# Patient Record
Sex: Female | Born: 1940 | Race: Black or African American | Hispanic: No | Marital: Single | Smoking: Current some day smoker
Health system: Southern US, Community
[De-identification: ages and names within clinical notes are randomized; demographics above are authoritative.]

## PROBLEM LIST (undated history)

## (undated) DIAGNOSIS — Z720 Tobacco use: Secondary | ICD-10-CM

## (undated) DIAGNOSIS — I2510 Atherosclerotic heart disease of native coronary artery without angina pectoris: Secondary | ICD-10-CM

## (undated) DIAGNOSIS — N184 Chronic kidney disease, stage 4 (severe): Secondary | ICD-10-CM

## (undated) DIAGNOSIS — M109 Gout, unspecified: Secondary | ICD-10-CM

## (undated) DIAGNOSIS — I700 Atherosclerosis of aorta: Secondary | ICD-10-CM

## (undated) DIAGNOSIS — I119 Hypertensive heart disease without heart failure: Secondary | ICD-10-CM

## (undated) DIAGNOSIS — I10 Essential (primary) hypertension: Secondary | ICD-10-CM

## (undated) DIAGNOSIS — I739 Peripheral vascular disease, unspecified: Secondary | ICD-10-CM

## (undated) DIAGNOSIS — M1990 Unspecified osteoarthritis, unspecified site: Secondary | ICD-10-CM

## (undated) DIAGNOSIS — E785 Hyperlipidemia, unspecified: Secondary | ICD-10-CM

## (undated) DIAGNOSIS — I639 Cerebral infarction, unspecified: Secondary | ICD-10-CM

## (undated) DIAGNOSIS — J449 Chronic obstructive pulmonary disease, unspecified: Secondary | ICD-10-CM

## (undated) DIAGNOSIS — E039 Hypothyroidism, unspecified: Secondary | ICD-10-CM

## (undated) DIAGNOSIS — K219 Gastro-esophageal reflux disease without esophagitis: Secondary | ICD-10-CM

## (undated) DIAGNOSIS — R109 Unspecified abdominal pain: Secondary | ICD-10-CM

## (undated) DIAGNOSIS — R1031 Right lower quadrant pain: Secondary | ICD-10-CM

## (undated) DIAGNOSIS — M5126 Other intervertebral disc displacement, lumbar region: Secondary | ICD-10-CM

## (undated) DIAGNOSIS — M48062 Spinal stenosis, lumbar region with neurogenic claudication: Secondary | ICD-10-CM

## (undated) DIAGNOSIS — I5030 Unspecified diastolic (congestive) heart failure: Secondary | ICD-10-CM

## (undated) DIAGNOSIS — M79641 Pain in right hand: Principal | ICD-10-CM

## (undated) DIAGNOSIS — N1832 Chronic kidney disease, stage 3b (HCC): Principal | ICD-10-CM

## (undated) HISTORY — PX: REPLACEMENT TOTAL KNEE: SUR1224

## (undated) HISTORY — PX: ABDOMINAL HYSTERECTOMY: SHX81

## (undated) HISTORY — DX: Cerebral infarction, unspecified: I63.9

## (undated) HISTORY — PX: THYROID SURGERY: SHX805

## (undated) HISTORY — DX: Chronic obstructive pulmonary disease, unspecified: J44.9

---

## 2012-11-26 LAB — D-DIMER, QUANTITATIVE: D-Dimer, Quant: 0.98 mg/L FEU — ABNORMAL HIGH (ref 0.00–0.65)

## 2012-11-26 LAB — CBC WITH AUTOMATED DIFF
ABS. BASOPHILS: 0 10*3/uL (ref 0.0–0.1)
ABS. EOSINOPHILS: 0.1 10*3/uL (ref 0.0–0.4)
ABS. LYMPHOCYTES: 1.7 10*3/uL (ref 0.8–3.5)
ABS. MONOCYTES: 0.3 10*3/uL (ref 0.0–1.0)
ABS. NEUTROPHILS: 1.5 10*3/uL — ABNORMAL LOW (ref 1.8–8.0)
BASOPHILS: 1 % (ref 0–1)
EOSINOPHILS: 4 % (ref 0–7)
HCT: 38.5 % (ref 35.0–47.0)
HGB: 13.4 g/dL (ref 11.5–16.0)
LYMPHOCYTES: 46 % (ref 12–49)
MCH: 29.7 PG (ref 26.0–34.0)
MCHC: 34.8 g/dL (ref 30.0–36.5)
MCV: 85.4 FL (ref 80.0–99.0)
MONOCYTES: 7 % (ref 5–13)
NEUTROPHILS: 42 % (ref 32–75)
PLATELET: 248 10*3/uL (ref 150–400)
RBC: 4.51 M/uL (ref 3.80–5.20)
RDW: 14.1 % (ref 11.5–14.5)
WBC: 3.7 10*3/uL (ref 3.6–11.0)

## 2012-11-26 LAB — METABOLIC PANEL, COMPREHENSIVE
A-G Ratio: 1 — ABNORMAL LOW (ref 1.1–2.2)
ALT (SGPT): 25 U/L (ref 12–78)
AST (SGOT): 26 U/L (ref 15–37)
Albumin: 3.9 g/dL (ref 3.5–5.0)
Alk. phosphatase: 95 U/L (ref 45–117)
Anion gap: 7 mmol/L (ref 5–15)
BUN/Creatinine ratio: 16 (ref 12–20)
BUN: 14 MG/DL (ref 6–20)
Bilirubin, total: 0.4 MG/DL (ref 0.2–1.0)
CO2: 23 mmol/L (ref 21–32)
Calcium: 9.4 MG/DL (ref 8.5–10.1)
Chloride: 110 mmol/L — ABNORMAL HIGH (ref 97–108)
Creatinine: 0.89 MG/DL (ref 0.45–1.15)
GFR est AA: >60 mL/min/{1.73_m2} (ref >60)
GFR est non-AA: >60 mL/min/{1.73_m2} (ref >60)
Globulin: 3.9 g/dL (ref 2.0–4.0)
Glucose: 92 mg/dL (ref 65–100)
Potassium: 3.9 mmol/L (ref 3.5–5.1)
Protein, total: 7.8 g/dL (ref 6.4–8.2)
Sodium: 140 mmol/L (ref 136–145)

## 2012-11-26 LAB — CK W/ REFLX CKMB: CK: 382 U/L — ABNORMAL HIGH (ref 26–192)

## 2012-11-26 LAB — D DIMER: D-dimer: 0.98 mg/L FEU — ABNORMAL HIGH (ref 0.00–0.65)

## 2012-11-26 LAB — CK-MB,QUANT.
CK - MB: 2.4 NG/ML (ref 0.5–3.6)
CK-MB Index: 0.6 (ref 0–2.5)

## 2012-11-26 LAB — TROPONIN I: Troponin-I, Qt.: <0.04 ng/mL (ref <0.05)

## 2012-11-26 MED ORDER — ASPIRIN 81 MG CHEWABLE TAB
81 mg | ORAL | Status: AC
Start: 2012-11-26 — End: 2012-11-26
  Administered 2012-11-26: via ORAL

## 2012-11-26 MED ORDER — ALUM-MAG HYDROXIDE-SIMETH 200 MG-200 MG-20 MG/5 ML ORAL SUSP
200-200-205 mg/5 mL | ORAL | Status: DC
Start: 2012-11-26 — End: 2012-11-26
  Administered 2012-11-26: 22:00:00 via ORAL

## 2012-11-26 MED FILL — BAYER CHILDRENS ASPIRIN 81 MG CHEWABLE TABLET: 81 mg | ORAL | Qty: 2

## 2012-11-26 NOTE — Other (Signed)
TRANSFER - OUT REPORT:    Verbal report given to Marisue Ivan RN(name) on Tanya [name]  being transferred to 458(unit) for routine progression of care       Report consisted of patient???s Situation, Background, Assessment and   Recommendations(SBAR).     Information from the following report(s) SBAR was reviewed with the receiving nurse.    Opportunity for questions and clarification was provided.

## 2012-11-26 NOTE — H&P (Signed)
Formatting of this note might be different from the original.  H&P dictated [id number]  Electronically signed by Noralyn PickEniola, Razaak A, MD at 11/26/2012  7:32 PM EDT

## 2012-11-26 NOTE — Unmapped (Signed)
Formatting of this note is different from the original.  TRANSFER - OUT REPORT:    Verbal report given to Marisue IvanLiz RN(name) on Tanya [name]  being transferred to 458(unit) for routine progression of care       Report consisted of patient?s Situation, Background, Assessment and   Recommendations(SBAR).     Information from the following report(s) SBAR was reviewed with the receiving nurse.    Opportunity for questions and clarification was provided.        Electronically signed by Carren RangMines, Markus F, RN at 11/26/2012  8:57 PM EDT

## 2012-11-26 NOTE — ED Notes (Signed)
Formatting of this note might be different from the original.  Pt in room with no changes in status or assessment, periods of mild chest pain, no noted SOB. Will cont to monitor.   Electronically signed by Carren RangMines, Markus F, RN at 11/26/2012  8:19 PM EDT

## 2012-11-26 NOTE — ED Notes (Signed)
Formatting of this note might be different from the original.  Pt in room with no distress or changes in status or assessment, will cont to monitor.   Electronically signed by Carren RangMines, Markus F, RN at 11/26/2012  8:19 PM EDT

## 2012-11-26 NOTE — Progress Notes (Signed)
Formatting of this note is different from the original.  TRANSFER - IN REPORT:    Verbal report received from WarrenMark, RN on Tanya [name]  being received from ED for routine progression of care      Report consisted of patient?s Situation, Background, Assessment and   Recommendations(SBAR).     Information from the following report(s) SBAR, ED Summary, [facility] and Recent Results was reviewed with the receiving nurse.    Opportunity for questions and clarification was provided.      Assessment completed upon patient?s arrival to unit and care assumed.       Electronically signed by Rhea BleacherMarshall, Elizabeth A, RN at 11/26/2012 10:47 PM EDT

## 2012-11-26 NOTE — ED Provider Notes (Signed)
Formatting of this note is different from the original.  HPI Comments: This is a 72 y.o. female who presents to the ED with chest pain.  Pt reported constant "burning" chest discomfort for the past week.  She initially suspected acid reflux but her "symptoms lasted longer than usual."  She denied any change with eating or walking although she noticed some new edema in her feet and ankles this week.  + nebulizer and cpap at night.  No increased SOB.  Hx of RCA stent in 2003 at [facility] in [place], [place].  Pt denied difficulty swallowing, fever, sore throat, cough, nausea, vomiting, diarrhea, urinary symptoms, abdominal pain, headache, numbness, tingling, weakness, or other medically acute issues.    Social HX: No tobacco, alcohol, or drugs    RUE:AVWUJ Leo Grosser, NP    Note written by Novella Olive, Scribe, as dictated by Shirl Harris, MD 5:51 PM    The history is provided by the patient.       Past Medical History   Diagnosis Date   ? CAD (coronary artery disease)    ? Asthma    ? Hypertension    ? Acid reflux        Past Surgical History   Procedure Laterality Date   ? Pr cardiac surg procedure unlist       Stents   ? Hx orthopaedic       right knee replacement       History reviewed. No pertinent family history.     History     Social History   ? Marital Status: N/A     Spouse Name: N/A     Number of Children: N/A   ? Years of Education: N/A     Occupational History   ? Not on file.     Social History Main Topics   ? Smoking status: Not on file   ? Smokeless tobacco: Not on file   ? Alcohol Use: Not on file   ? Drug Use: Not on file   ? Sexually Active: Not on file     Other Topics Concern   ? Not on file     Social History Narrative   ? No narrative on file         ALLERGIES: Iodinated contrast media - iv dye and Pcn    Review of Systems   Constitutional: Negative.  Negative for fever, chills, diaphoresis, appetite change and fatigue.   HENT: Negative.  Negative for congestion, sore  throat, rhinorrhea, trouble swallowing, neck pain, neck stiffness and postnasal drip.    Eyes: Negative.  Negative for photophobia, pain, discharge, redness and visual disturbance.   Respiratory: Negative.  Negative for cough, chest tightness, shortness of breath and wheezing.    Cardiovascular: Positive for chest pain. Negative for palpitations and leg swelling.   Gastrointestinal: Negative.  Negative for nausea, vomiting, abdominal pain, diarrhea, constipation, blood in stool and abdominal distention.   Genitourinary: Negative for dysuria, urgency, frequency, hematuria, decreased urine volume, vaginal bleeding and difficulty urinating.   Musculoskeletal: Negative.  Negative for myalgias, back pain, joint swelling and arthralgias.   Skin: Negative.  Negative for color change, rash and wound.   Neurological: Negative.  Negative for dizziness, tremors, seizures, syncope, speech difficulty, weakness, light-headedness, numbness and headaches.   Psychiatric/Behavioral: Negative.  Negative for behavioral problems, confusion and agitation. The patient is not nervous/anxious.    All other systems reviewed and are negative.    Filed Vitals:  11/26/12 1615   BP: 179/99   Pulse: 84   Temp: 98.3 F (36.8 C)   Resp: 20   Height: 5' (1.524 m)   Weight: 92.987 kg (205 lb)   SpO2: 98%       Physical Exam   Nursing note and vitals reviewed.  Constitutional: She is oriented to person, place, and time. She appears well-developed and well-nourished. No distress.   HENT:   Head: Normocephalic and atraumatic.   Right Ear: External ear normal.   Left Ear: External ear normal.   Nose: Nose normal.   Mouth/Throat: Oropharynx is clear and moist.   Eyes: Conjunctivae and EOM are normal. Pupils are equal, round, and reactive to light. No scleral icterus.   Neck: Normal range of motion. Neck supple. No JVD present. No tracheal deviation present. No thyromegaly present.   No JVD   Cardiovascular: Normal rate, regular rhythm, normal heart  sounds and intact distal pulses.  Exam reveals no gallop and no friction rub.    No murmur heard.  Pulmonary/Chest: Effort normal and breath sounds normal. No respiratory distress. She has no wheezes. She has no rales. She exhibits no tenderness.   No tenderness over chest wall   Abdominal: Soft. Bowel sounds are normal. She exhibits no distension and no mass. There is no tenderness. There is no rebound and no guarding.   Musculoskeletal: Normal range of motion. She exhibits edema (trace, feet and ankles). She exhibits no tenderness.   Lymphadenopathy:     She has no cervical adenopathy.   Neurological: She is alert and oriented to person, place, and time. She has normal reflexes. No cranial nerve deficit. Coordination normal.   Skin: Skin is warm and dry. No rash noted. She is not diaphoretic. No erythema. No pallor.   Psychiatric: She has a normal mood and affect. Her behavior is normal. Judgment and thought content normal.       MDM    Differential Diagnosis; Clinical Impression; Plan:     72 year old African American female visiting from out of town presents to the emergency department with chest pain. Patient reports the chest pain is left-sided and central. The pain has been intermittent over the last week. Difficult to determine if anything makes it better or worse. EKG shows inverted T waves I and aVL with left axis deviation. Patient had a stent placed in 2003 at [facility]. We'll check laboratory studies and chest x-ray and reassess. Patient agrees with this plan.  Amount and/or Complexity of Data Reviewed:   Clinical lab tests:  Ordered and reviewed  Tests in the radiology section of CPT:  Ordered and reviewed  Tests in the medicine section of the CPT:  Ordered and reviewed   Decide to obtain previous medical records or to obtain history from someone other than the patient:  Yes   Obtain history from someone other than the patient:  Yes   Review and summarize past medical records:  Yes   Independant  visualization of image, tracing, or specimen:  Yes  Risk of Significant Complications, Morbidity, and/or Mortality:   Presenting problems:  High  Diagnostic procedures:  High  Management options:  High  Progress:   Patient progress:  Improved    Procedures    6:56 PM  Pt has had chest discomfort for several days. She's not from here. Pt has T wave inversions laterally. History of cardiac stent. Will admit for cardiac evalaution.    CXR- No acute process    Labs  including Troponin negative    CONSULT  7:15 PM  Spoke to Dr Lum BabeEniola hospitalist. He wanted d-dimer added. He will follow up on result. He will admit    Admission for cardiology evaluation and chest pain rule out  Electronically signed by Shirl Harrisowning, Brian E, MD at 11/26/2012  9:29 PM EDT

## 2012-11-26 NOTE — ED Notes (Signed)
Formatting of this note might be different from the original.  Triage Note:  Pt arrives for sternal area CP that started about a week ago.  Pt states she is feeling a little short of breath and has nausea without vomiting.  Pt states she has hx acid reflux but that this feels different.  Electronically signed by Delton SeeHowdyshell, Ashley M, RN at 11/26/2012  4:20 PM EDT

## 2012-11-26 NOTE — ED Notes (Signed)
Pt in room with no changes in status or assessment, periods of mild chest pain, no noted SOB. Will cont to monitor.

## 2012-11-26 NOTE — ED Notes (Signed)
Triage Note:  Pt arrives for sternal area CP that started about a week ago.  Pt states she is feeling a little short of breath and has nausea without vomiting.  Pt states she has hx acid reflux but that this feels different.

## 2012-11-26 NOTE — Progress Notes (Signed)
TRANSFER - IN REPORT:    Verbal report received from [place], RN on Ginger Leeth  being received from ED for routine progression of care      Report consisted of patient???s Situation, Background, Assessment and   Recommendations(SBAR).     Information from the following report(s) SBAR, ED Summary, [facility] and Recent Results was reviewed with the receiving nurse.    Opportunity for questions and clarification was provided.      Assessment completed upon patient???s arrival to unit and care assumed.

## 2012-11-26 NOTE — H&P (Signed)
Name:       Tanya [name], Tanya [name]                    Admitted:    11/26/2012    Account #:  [id number]                     DOB:         1941/05/21  Physician:  Corene Cornea, MD          Age:         72                               HISTORY AND PHYSICAL      PRIMARY CARE PHYSICIAN: Chauncey Fischer, MD    SOURCE OF INFORMATION: The patient.    CHIEF COMPLAINT: Chest pain.    HISTORY OF PRESENT ILLNESS: This is a 72 year old woman with a past medical  history significant for coronary artery disease, status post stent  placement, obstructive sleep apnea, hypertension, COPD, who was in her  usual state of health until about a week ago when the patient started  experiencing chest pain. Initially, the patient thought that the chest pain  is due to acid reflux, but this episode of chest pain lasted longer than  usual and the chest pain that she is experiencing is similar to the chest  pain she had when she was diagnosed with coronary artery disease and stent  procedure was performed. The patient is visiting the [place] area from  another part of [place]. She came to the emergency room today because of  worsening chest pain. When the patient arrived at the emergency room, she  was evaluated by the emergency room physician. EKG was obtained. EKG showed  some changes according to the emergency room physician, but there was no  old EKG to compare the changes with. The patient was subsequently referred  to the hospitalist service for evaluation for admission. No history of  fever, rigors or chills.    PAST MEDICAL HISTORY: Coronary artery disease status post stent placement,  obstructive sleep apnea, hypertension, COPD.    ALLERGIES  1. PENICILLIN.  2. IV DYE.    MEDICATIONS: The patient's list of home medications has not been updated.    FAMILY HISTORY: This was reviewed. Mother had hypertension.    PAST SURGICAL HISTORY: This is significant for cardiac stent placement,  right knee replacement.    SOCIAL HISTORY: The patient  smokes about a half a pack of cigarettes daily.  Denies alcohol abuse.    REVIEW OF SYSTEMS  HEAD, EYES, EARS, NOSE AND THROAT: No headache, no dizziness, no blurring  of vision, no photophobia.  RESPIRATORY SYSTEM: This is positive for cough and shortness of breath, no  hemoptysis.  CARDIOVASCULAR SYSTEM: This is positive for chest pain, no orthopnea, no  palpitation.  GASTROINTESTINAL SYSTEM: No nausea and vomiting, no diarrhea, no  constipation.  GENITOURINARY: No dysuria, no urgency and no frequency.  All other systems are reviewed and they are negative.    PHYSICAL EXAMINATION  GENERAL APPEARANCE: The patient appeared ill, in moderate distress.  VITAL SIGNS: On arrival at the emergency room, temperature 98.3, pulse 84,  respiratory rate , blood pressure 179/99, oxygen saturation 98% on room  air.  HEAD, EYES, EARS, NOSE AND THROAT: Normocephalic, atraumatic. Neck is  supple. No JVD, no carotid bruit. Pupils equal and reactive to light.  No  neck masses.  CHEST: Clear breath sounds. No wheezing, no crackles.  HEART: Normal S1 and S2, regular. No clinically appreciable murmur.  ABDOMEN: Soft, obese, nontender, normal bowel sounds.  CENTRAL NERVOUS SYSTEM: Alert, oriented x3. No gross focal neurological  deficits.  EXTREMITIES: Edema 2+, pulses 2+ bilaterally.    DIAGNOSTIC DATA: Chest x-ray: No acute process. EKG shows a normal sinus  rhythm, left ventricular hypertrophy. No ST and T-wave abnormalities.    LABORATORY DATA: Cardiac profile: CK-MB 2.4, troponin less than 0.04.  Chemistry: Sodium 140, potassium 3.9, chloride 110, CO2 23, glucose 92, BUN  14, creatinine 0.89, calcium 9.4, bilirubin total 0.4, ALT 35, AST 26,  alkaline phosphatase 95, total protein 7.8, albumin level 3.9, globulin at  3.4. Hematology: WBC 3.7, hemoglobin 13.4, hematocrit 38.5, platelets 248.    ASSESSMENT  1. Chest pain.  2. Obstructive sleep apnea.  3. Hypertension.  4. Tobacco abuse.  5. Chronic obstructive pulmonary disease.    PLAN   1. Chest pain. Will place the patient on observation. Will obtain serial  cardiac markers to rule out acute myocardial infarction. Cardiology consult  will be requested to assist in evaluation and treatment. Will start the  patient on aspirin and a beta blocker. Will also evaluate the patient for  other atypical causes of chest pain. Will obtain a D-dimer. If this is  significantly elevated, will evaluate the patient for thromboembolism as a  possible cause of her chest pain, especially given the history of recent  travel to [place] area. Will check amylase and lipase levels.  2. Obstructive sleep apnea. Will continue with CPAP with the home setting.  3. Hypertension. Will resume preadmission medication once the list of her  home medication has been updated.  4. Tobacco abuse. Patient advised to quit smoking. Will place the patient  on Nicoderm patch.  5. Chronic obstructive pulmonary disease. Will resume her home medications  if this is known. While awaiting for home medications, the patient will be  placed on DuoNebs. Since the patient also complained of shortness of  breath, will obtain a BNP level as well.    OTHER ISSUES: Code status: The patient is a FULL CODE. Will place the  patient on Lovenox for DVT prophylaxis.                Corene Cornea, MD    cc:                       Corene Cornea, MD  Chauncey Fischer, MD, PRIMARY CARE PHYSICIAN      RAE/wmx; D: 11/26/2012 07:30 P; T: 11/26/2012 08:07 P; DOC# [id number]; Job#  [id number]

## 2012-11-26 NOTE — ED Provider Notes (Signed)
HPI Comments: This is a 72 y.o. female who presents to the ED with chest pain.  Pt reported constant "burning" chest discomfort for the past week.  She initially suspected acid reflux but her "symptoms lasted longer than usual."  She denied any change with eating or walking although she noticed some new edema in her feet and ankles this week.  + nebulizer and cpap at night.  No increased SOB.  Hx of RCA stent in 2003 at [facility] in [place], [place].  Pt denied difficulty swallowing, fever, sore throat, cough, nausea, vomiting, diarrhea, urinary symptoms, abdominal pain, headache, numbness, tingling, weakness, or other medically acute issues.    Social HX: No tobacco, alcohol, or drugs      ZOX:WRUEA Leo Grosser, NP    Note written by Novella Olive, Scribe, as dictated by Shirl Harris, MD 5:51 PM        The history is provided by the patient.        Past Medical History   Diagnosis Date   ??? CAD (coronary artery disease)    ??? Asthma    ??? Hypertension    ??? Acid reflux         Past Surgical History   Procedure Laterality Date   ??? Pr cardiac surg procedure unlist       Stents   ??? Hx orthopaedic       right knee replacement         History reviewed. No pertinent family history.     History     Social History   ??? Marital Status: N/A     Spouse Name: N/A     Number of Children: N/A   ??? Years of Education: N/A     Occupational History   ??? Not on file.     Social History Main Topics   ??? Smoking status: Not on file   ??? Smokeless tobacco: Not on file   ??? Alcohol Use: Not on file   ??? Drug Use: Not on file   ??? Sexually Active: Not on file     Other Topics Concern   ??? Not on file     Social History Narrative   ??? No narrative on file                  ALLERGIES: Iodinated contrast media - iv dye and Pcn      Review of Systems   Constitutional: Negative.  Negative for fever, chills, diaphoresis, appetite change and fatigue.   HENT: Negative.  Negative for congestion, sore throat, rhinorrhea, trouble swallowing,  neck pain, neck stiffness and postnasal drip.    Eyes: Negative.  Negative for photophobia, pain, discharge, redness and visual disturbance.   Respiratory: Negative.  Negative for cough, chest tightness, shortness of breath and wheezing.    Cardiovascular: Positive for chest pain. Negative for palpitations and leg swelling.   Gastrointestinal: Negative.  Negative for nausea, vomiting, abdominal pain, diarrhea, constipation, blood in stool and abdominal distention.   Genitourinary: Negative for dysuria, urgency, frequency, hematuria, decreased urine volume, vaginal bleeding and difficulty urinating.   Musculoskeletal: Negative.  Negative for myalgias, back pain, joint swelling and arthralgias.   Skin: Negative.  Negative for color change, rash and wound.   Neurological: Negative.  Negative for dizziness, tremors, seizures, syncope, speech difficulty, weakness, light-headedness, numbness and headaches.   Psychiatric/Behavioral: Negative.  Negative for behavioral problems, confusion and agitation. The patient is not nervous/anxious.    All  other systems reviewed and are negative.        Filed Vitals:    11/26/12 1615   BP: 179/99   Pulse: 84   Temp: 98.3 ??F (36.8 ??C)   Resp: 20   Height: 5' (1.524 m)   Weight: 92.987 kg (205 lb)   SpO2: 98%            Physical Exam   Nursing note and vitals reviewed.  Constitutional: She is oriented to person, place, and time. She appears well-developed and well-nourished. No distress.   HENT:   Head: Normocephalic and atraumatic.   Right Ear: External ear normal.   Left Ear: External ear normal.   Nose: Nose normal.   Mouth/Throat: Oropharynx is clear and moist.   Eyes: Conjunctivae and EOM are normal. Pupils are equal, round, and reactive to light. No scleral icterus.   Neck: Normal range of motion. Neck supple. No JVD present. No tracheal deviation present. No thyromegaly present.   No JVD   Cardiovascular: Normal rate, regular rhythm, normal heart sounds and intact distal pulses.   Exam reveals no gallop and no friction rub.    No murmur heard.  Pulmonary/Chest: Effort normal and breath sounds normal. No respiratory distress. She has no wheezes. She has no rales. She exhibits no tenderness.   No tenderness over chest wall   Abdominal: Soft. Bowel sounds are normal. She exhibits no distension and no mass. There is no tenderness. There is no rebound and no guarding.   Musculoskeletal: Normal range of motion. She exhibits edema (trace, feet and ankles). She exhibits no tenderness.   Lymphadenopathy:     She has no cervical adenopathy.   Neurological: She is alert and oriented to person, place, and time. She has normal reflexes. No cranial nerve deficit. Coordination normal.   Skin: Skin is warm and dry. No rash noted. She is not diaphoretic. No erythema. No pallor.   Psychiatric: She has a normal mood and affect. Her behavior is normal. Judgment and thought content normal.        MDM     Differential Diagnosis; Clinical Impression; Plan:     72 year old African American female visiting from out of town presents to the emergency department with chest pain. Patient reports the chest pain is left-sided and central. The pain has been intermittent over the last week. Difficult to determine if anything makes it better or worse. EKG shows inverted T waves I and aVL with left axis deviation. Patient had a stent placed in 2003 at [facility]. We'll check laboratory studies and chest x-ray and reassess. Patient agrees with this plan.  Amount and/or Complexity of Data Reviewed:   Clinical lab tests:  Ordered and reviewed  Tests in the radiology section of CPT??:  Ordered and reviewed  Tests in the medicine section of the CPT??:  Ordered and reviewed   Decide to obtain previous medical records or to obtain history from someone other than the patient:  Yes   Obtain history from someone other than the patient:  Yes   Review and summarize past medical records:  Yes   Independant visualization of image,  tracing, or specimen:  Yes  Risk of Significant Complications, Morbidity, and/or Mortality:   Presenting problems:  High  Diagnostic procedures:  High  Management options:  High  Progress:   Patient progress:  Improved      Procedures    6:56 PM  Pt has had chest discomfort for several days. She's not from  here. Pt has T wave inversions laterally. History of cardiac stent. Will admit for cardiac evalaution.    CXR- No acute process    Labs including Troponin negative    CONSULT  7:15 PM  Spoke to Dr Lum Babe hospitalist. He wanted d-dimer added. He will follow up on result. He will admit    Admission for cardiology evaluation and chest pain rule out

## 2012-11-26 NOTE — ED Notes (Signed)
Pt in room with no distress or changes in status or assessment, will cont to monitor.

## 2012-11-26 NOTE — H&P (Signed)
H&P dictated [id number]

## 2012-11-27 LAB — EKG, 12 LEAD, INITIAL
Atrial Rate: 83 {beats}/min
Calculated P Axis: 64 degrees
Calculated R Axis: -47 degrees
Calculated T Axis: 93 degrees
Diagnosis: NORMAL
P-R Interval: 132 ms
Q-T Interval: 396 ms
QRS Duration: 96 ms
QTC Calculation (Bezet): 465 ms
Ventricular Rate: 83 {beats}/min

## 2012-11-27 LAB — ECHO DOBUTAMINE STRESS TRACING
ECG Interp. Before Exercise: NORMAL
Max. Diastolic BP: 78 mmHg
Max. Heart rate: 160 {beats}/min
Max. Systolic BP: 167 mmHg
Peak Ex METs: 1 METS

## 2012-11-27 LAB — METABOLIC PANEL, COMPREHENSIVE
A-G Ratio: 1 — ABNORMAL LOW (ref 1.1–2.2)
ALT (SGPT): 20 U/L (ref 12–78)
AST (SGOT): 16 U/L (ref 15–37)
Albumin: 3.4 g/dL — ABNORMAL LOW (ref 3.5–5.0)
Alk. phosphatase: 78 U/L (ref 45–117)
Anion gap: 5 mmol/L (ref 5–15)
BUN/Creatinine ratio: 14 (ref 12–20)
BUN: 13 MG/DL (ref 6–20)
Bilirubin, total: 0.3 MG/DL (ref 0.2–1.0)
CO2: 25 mmol/L (ref 21–32)
Calcium: 8.8 MG/DL (ref 8.5–10.1)
Chloride: 110 mmol/L — ABNORMAL HIGH (ref 97–108)
Creatinine: 0.96 MG/DL (ref 0.45–1.15)
GFR est AA: >60 mL/min/{1.73_m2} (ref >60)
GFR est non-AA: 57 mL/min/{1.73_m2} — ABNORMAL LOW (ref >60)
Globulin: 3.3 g/dL (ref 2.0–4.0)
Glucose: 88 mg/dL (ref 65–100)
Potassium: 3.6 mmol/L (ref 3.5–5.1)
Protein, total: 6.7 g/dL (ref 6.4–8.2)
Sodium: 140 mmol/L (ref 136–145)

## 2012-11-27 LAB — CK W/ CKMB & INDEX
CK - MB: 2.2 NG/ML (ref 0.5–3.6)
CK-MB Index: 0.8 (ref 0–2.5)
CK: 287 U/L — ABNORMAL HIGH (ref 26–192)

## 2012-11-27 LAB — URINALYSIS W/MICROSCOPIC
Bacteria: NEGATIVE /hpf
Bilirubin: NEGATIVE
Blood: NEGATIVE
Glucose: NEGATIVE mg/dL
Ketone: NEGATIVE mg/dL
Nitrites: NEGATIVE
Protein: NEGATIVE mg/dL
Specific gravity: 1.014 (ref 1.003–1.030)
Urobilinogen: 0.2 EU/dL (ref 0.2–1.0)
pH (UA): 6 (ref 5.0–8.0)

## 2012-11-27 LAB — CBC WITH AUTOMATED DIFF
ABS. BASOPHILS: 0 10*3/uL (ref 0.0–0.1)
ABS. EOSINOPHILS: 0.1 10*3/uL (ref 0.0–0.4)
ABS. LYMPHOCYTES: 2.3 10*3/uL (ref 0.8–3.5)
ABS. MONOCYTES: 0.3 10*3/uL (ref 0.0–1.0)
ABS. NEUTROPHILS: 1.3 10*3/uL — ABNORMAL LOW (ref 1.8–8.0)
BASOPHILS: 1 % (ref 0–1)
EOSINOPHILS: 3 % (ref 0–7)
HCT: 37.8 % (ref 35.0–47.0)
HGB: 13 g/dL (ref 11.5–16.0)
LYMPHOCYTES: 57 % — ABNORMAL HIGH (ref 12–49)
MCH: 29.5 PG (ref 26.0–34.0)
MCHC: 34.4 g/dL (ref 30.0–36.5)
MCV: 85.9 FL (ref 80.0–99.0)
MONOCYTES: 7 % (ref 5–13)
NEUTROPHILS: 32 % (ref 32–75)
PLATELET: 240 10*3/uL (ref 150–400)
RBC: 4.4 M/uL (ref 3.80–5.20)
RDW: 14.1 % (ref 11.5–14.5)
WBC: 4.1 10*3/uL (ref 3.6–11.0)

## 2012-11-27 LAB — LIPID PANEL
CHOL/HDL Ratio: 2.7 (ref 0–5.0)
Cholesterol, total: 162 MG/DL (ref <200)
HDL Cholesterol: 59 MG/DL
LDL, calculated: 74.4 MG/DL (ref 0–100)
Triglyceride: 143 MG/DL (ref <150)
VLDL, calculated: 28.6 MG/DL

## 2012-11-27 LAB — TROPONIN I: Troponin-I, Qt.: <0.04 ng/mL (ref <0.05)

## 2012-11-27 LAB — MAGNESIUM: Magnesium: 1.9 mg/dL (ref 1.6–2.4)

## 2012-11-27 LAB — BNP: BNP: 24 pg/mL (ref 0–100)

## 2012-11-27 LAB — LIPASE: Lipase: 190 U/L (ref 73–393)

## 2012-11-27 LAB — AMYLASE: Amylase: 55 U/L (ref 25–115)

## 2012-11-27 LAB — HEMOGLOBIN A1C WITH EAG
Est. average glucose: 131 mg/dL
Hemoglobin A1c: 6.2 % (ref 4.2–6.3)

## 2012-11-27 LAB — PHOSPHORUS: Phosphorus: 3.9 MG/DL (ref 2.5–4.9)

## 2012-11-27 LAB — TSH 3RD GENERATION: TSH: 2.61 u[IU]/mL (ref 0.36–3.74)

## 2012-11-27 MED ORDER — LISINOPRIL 20 MG TAB
20 mg | Freq: Every day | ORAL | Status: DC
Start: 2012-11-27 — End: 2012-11-28
  Administered 2012-11-27 – 2012-11-28 (×2): via ORAL

## 2012-11-27 MED ORDER — NICOTINE 21 MG/24 HR DAILY PATCH
21 mg/24 hr | TRANSDERMAL | Status: DC
Start: 2012-11-27 — End: 2012-11-28

## 2012-11-27 MED ORDER — HYDROCODONE-ACETAMINOPHEN 5 MG-325 MG TAB
5-325 mg | Freq: Four times a day (QID) | ORAL | Status: DC | PRN
Start: 2012-11-27 — End: 2012-11-28
  Administered 2012-11-28: 14:00:00 via ORAL

## 2012-11-27 MED ORDER — BISACODYL 10 MG RECTAL SUPPOSITORY
10 mg | Freq: Every day | RECTAL | Status: DC | PRN
Start: 2012-11-27 — End: 2012-11-28

## 2012-11-27 MED ORDER — BISACODYL 5 MG TAB, DELAYED RELEASE
5 mg | Freq: Every day | ORAL | Status: DC | PRN
Start: 2012-11-27 — End: 2012-11-28

## 2012-11-27 MED ORDER — ATROPINE 0.1 MG/ML SYRINGE
0.1 mg/mL | Freq: Once | INTRAMUSCULAR | Status: AC
Start: 2012-11-27 — End: 2012-11-27
  Administered 2012-11-27: 15:00:00 via INTRAVENOUS

## 2012-11-27 MED ORDER — ASPIRIN 81 MG CHEWABLE TAB
81 mg | Freq: Every day | ORAL | Status: DC
Start: 2012-11-27 — End: 2012-11-28
  Administered 2012-11-27 – 2012-11-28 (×2): via ORAL

## 2012-11-27 MED ORDER — ENOXAPARIN 40 MG/0.4 ML SUB-Q SYRINGE
40 mg/0.4 mL | SUBCUTANEOUS | Status: DC
Start: 2012-11-27 — End: 2012-11-28
  Administered 2012-11-27 – 2012-11-28 (×2): via SUBCUTANEOUS

## 2012-11-27 MED ORDER — HYDROCODONE-ACETAMINOPHEN 5 MG-325 MG TAB
5-325 mg | ORAL | Status: DC | PRN
Start: 2012-11-27 — End: 2012-11-28
  Administered 2012-11-27 – 2012-11-28 (×2): via ORAL

## 2012-11-27 MED ORDER — DOBUTAMINE IN D5W 2,000 MCG/ML IV
500 mg/250 mL (2,000 mcg/mL) | Freq: Once | INTRAVENOUS | Status: AC
Start: 2012-11-27 — End: 2012-11-27
  Administered 2012-11-27: 15:00:00 via INTRAVENOUS

## 2012-11-27 MED ORDER — ACETAMINOPHEN 325 MG TABLET
325 mg | ORAL | Status: DC | PRN
Start: 2012-11-27 — End: 2012-11-28

## 2012-11-27 MED ORDER — ESMOLOL 10 MG/ML IV SOLN
100 mg/10 mL (10 mg/mL) | INTRAVENOUS | Status: DC | PRN
Start: 2012-11-27 — End: 2012-11-27
  Administered 2012-11-27 (×2): via INTRAVENOUS

## 2012-11-27 MED ORDER — FLU VACCINE TS 2013-14(36 MOS+)(PF) 45 MCG (15 MCG X 3)/0.5 ML IM SYRINGE
45 mcg (15 mcg x 3)/0.5 mL | INTRAMUSCULAR | Status: DC
Start: 2012-11-27 — End: 2012-11-28

## 2012-11-27 MED ORDER — SODIUM CHLORIDE 0.9 % IJ SYRG
INTRAMUSCULAR | Status: AC
Start: 2012-11-27 — End: 2012-11-27
  Administered 2012-11-27: 10:00:00

## 2012-11-27 MED ORDER — SALINE PERIPHERAL FLUSH PRN
Freq: Once | INTRAMUSCULAR | Status: AC
Start: 2012-11-27 — End: 2012-11-27
  Administered 2012-11-27: 15:00:00

## 2012-11-27 MED ORDER — LOVASTATIN 20 MG TAB
20 mg | Freq: Every evening | ORAL | Status: DC
Start: 2012-11-27 — End: 2012-11-28
  Administered 2012-11-28: 03:00:00 via ORAL

## 2012-11-27 MED ORDER — MORPHINE 10 MG/ML INJ SOLUTION
10 mg/ml | INTRAMUSCULAR | Status: DC | PRN
Start: 2012-11-27 — End: 2012-11-28

## 2012-11-27 MED ORDER — CLOPIDOGREL 75 MG TAB
75 mg | Freq: Every day | ORAL | Status: DC
Start: 2012-11-27 — End: 2012-11-28
  Administered 2012-11-27 – 2012-11-28 (×2): via ORAL

## 2012-11-27 MED ORDER — ONDANSETRON (PF) 4 MG/2 ML INJECTION
4 mg/2 mL | INTRAMUSCULAR | Status: DC | PRN
Start: 2012-11-27 — End: 2012-11-28
  Administered 2012-11-27: 18:00:00 via INTRAVENOUS

## 2012-11-27 MED ORDER — SODIUM CHLORIDE 0.9 % IJ SYRG
INTRAMUSCULAR | Status: AC
Start: 2012-11-27 — End: 2012-11-26
  Administered 2012-11-27: 02:00:00

## 2012-11-27 MED ORDER — PANTOPRAZOLE 40 MG TAB, DELAYED RELEASE
40 mg | Freq: Every day | ORAL | Status: DC
Start: 2012-11-27 — End: 2012-11-28
  Administered 2012-11-27 – 2012-11-28 (×2): via ORAL

## 2012-11-27 MED ORDER — IPRATROPIUM-ALBUTEROL 2.5 MG-0.5 MG/3 ML NEB SOLUTION
2.5 mg-0.5 mg/3 ml | RESPIRATORY_TRACT | Status: DC | PRN
Start: 2012-11-27 — End: 2012-11-28

## 2012-11-27 MED ORDER — METOPROLOL TARTRATE 50 MG TAB
50 mg | Freq: Two times a day (BID) | ORAL | Status: DC
Start: 2012-11-27 — End: 2012-11-28
  Administered 2012-11-27 – 2012-11-28 (×3): via ORAL

## 2012-11-27 MED ORDER — DOCUSATE SODIUM 100 MG CAP
100 mg | Freq: Two times a day (BID) | ORAL | Status: DC
Start: 2012-11-27 — End: 2012-11-28
  Administered 2012-11-27 – 2012-11-28 (×3): via ORAL

## 2012-11-27 MED ORDER — ISOSORBIDE MONONITRATE SR 30 MG 24 HR TAB
30 mg | Freq: Every day | ORAL | Status: DC
Start: 2012-11-27 — End: 2012-11-28
  Administered 2012-11-27 – 2012-11-28 (×2): via ORAL

## 2012-11-27 MED FILL — HYDROCODONE-ACETAMINOPHEN 5 MG-325 MG TAB: 5-325 mg | ORAL | Qty: 1

## 2012-11-27 MED FILL — PANTOPRAZOLE 40 MG TAB, DELAYED RELEASE: 40 mg | ORAL | Qty: 1

## 2012-11-27 MED FILL — LOVENOX 40 MG/0.4 ML SUBCUTANEOUS SYRINGE: 40 mg/0.4 mL | SUBCUTANEOUS | Qty: 0.4

## 2012-11-27 MED FILL — BAYER CHILDRENS ASPIRIN 81 MG CHEWABLE TABLET: 81 mg | ORAL | Qty: 1

## 2012-11-27 MED FILL — NICOTINE 21 MG/24 HR DAILY PATCH: 21 mg/24 hr | TRANSDERMAL | Qty: 1

## 2012-11-27 MED FILL — METOPROLOL TARTRATE 50 MG TAB: 50 mg | ORAL | Qty: 1

## 2012-11-27 MED FILL — PLAVIX 75 MG TABLET: 75 mg | ORAL | Qty: 1

## 2012-11-27 MED FILL — BD POSIFLUSH NORMAL SALINE 0.9 % INJECTION SYRINGE: INTRAMUSCULAR | Qty: 10

## 2012-11-27 MED FILL — DOCUSATE SODIUM 100 MG CAP: 100 mg | ORAL | Qty: 1

## 2012-11-27 MED FILL — LISINOPRIL 20 MG TAB: 20 mg | ORAL | Qty: 2

## 2012-11-27 MED FILL — ATROPINE 0.1 MG/ML SYRINGE: 0.1 mg/mL | INTRAMUSCULAR | Qty: 10

## 2012-11-27 MED FILL — DOBUTAMINE IN D5W 2,000 MCG/ML IV: 500 mg/250 mL (2,000 mcg/mL) | INTRAVENOUS | Qty: 250

## 2012-11-27 MED FILL — ISOSORBIDE MONONITRATE SR 30 MG 24 HR TAB: 30 mg | ORAL | Qty: 1

## 2012-11-27 NOTE — Consults (Signed)
Associated Order(s): IP CONSULT TO CARDIOLOGY  Formatting of this note is different from the original.    Date of  Admission: 11/26/2012  4:24 PM    Tanya [name] is a 72 y.o. female admitted for Chest pain  Subjective:  Tanya [name] is here visiting her grandchildren and developed chest pain.  Her symptoms include SOB, chest burning, similar to her GERD pain.  She has hx including CAD, HTN, no exercise, obesity.  Soc: tobacco abuse, marijuana abuse, social ETOH    reports chest pain, DOE, GERD like sx.  Cardiac risk factors: smoking/ tobacco exposure, dyslipidemia, diabetes mellitus, obesity, sedentary life style, hypertension, post-menopausal.    Assessment/Plan:  1.  Unstable angina- troponin negative x2, VQ negative for PE  --will get dobutamine stress echo today, can not do nuclear scan as she had VQ yesterday, BNP 24  2.  CAD--hx stent in 2002, done in [place], does not follow with cardiology, continue asa, BB, restart home statin, isosorbide and plavix  3.  HTN--suboptimal control, on metoprolol, restart home lisinopril  4.  Obesity--Body mass index is 38.15 kg/(m^2).   5.  Asthma--uses inhalers  6.  OSA--uses CPAP at home, no oxygen bled into machine at present but did in the past  7.  Tobacco abuse--1/4-1/2PPD. counseled to quit, continue nicoderm patch  8.  Marijuana abuse--counseled to quit  9.  LE dopplers--negative for DVT  10.  GERD--per primary team  11.  Dyslipidemia--TG 143 TC 162 HDL 59 LDL 74, restart home mevacor  12.  Hypothyroidism--takes synthroid at home, defer to primary team to restart, TSH 2.61  13.  Get a1c    Saw and evaluated pt and agree with above assessment and plan.  Chest pains with typical and atypical features.  No evidence of acute ischemia.  Cannot obtain nuclear stress test for 2 days as V/Q scan done this AM; will obtain dobutamine stress echo for further evaluation.     ADDENDUM: stress test was normal.  No further cardiac evaluation indicated and will sign off.  Recommend pt f/u  outpt with her local cardiologist.  Becky AugustaSAMUEL S WU, MD    Patient Active Problem List    Diagnosis Date Noted   ? Chest pain 11/26/2012     Chauncey Fischeronna Ambrose, NP  Past Medical History   Diagnosis Date   ? CAD (coronary artery disease)    ? Asthma    ? Hypertension    ? Acid reflux      Past Surgical History   Procedure Laterality Date   ? Pr cardiac surg procedure unlist       Stents   ? Hx orthopaedic       right knee replacement     Allergies   Allergen Reactions   ? Iodinated Contrast Media - Iv Dye Unknown (comments)   ? Pcn (Penicillins) Hives     History reviewed. No pertinent family history.   Current Facility-Administered Medications   Medication Dose Route Frequency   ? [START ON 11/28/2012] influenza vaccine 2013-14(8044yr+)(PF) (FLUZONE,FLUARIX) injection 0.5 mL  0.5 mL IntraMUSCular PRIOR TO DISCHARGE   ? [COMPLETED] sodium chloride (NS) 0.9 % flush       ? [COMPLETED] aspirin chewable tablet 162 mg  162 mg Oral NOW   ? acetaminophen (TYLENOL) tablet 650 mg  650 mg Oral Q4H PRN   ? HYDROcodone-acetaminophen (NORCO) 5-325 mg per tablet 1 Tab  1 Tab Oral Q4H PRN   ? morphine injection 2 mg  2 mg IntraVENous  Q4H PRN   ? acetaminophen (TYLENOL) tablet 650 mg  650 mg Oral Q4H PRN   ? ondansetron (ZOFRAN) injection 4 mg  4 mg IntraVENous Q4H PRN   ? bisacodyl (DULCOLAX) suppository 10 mg  10 mg Rectal DAILY PRN   ? bisacodyl (DULCOLAX) tablet 5 mg  5 mg Oral DAILY PRN   ? docusate sodium (COLACE) capsule 100 mg  100 mg Oral BID   ? enoxaparin (LOVENOX) injection 40 mg  40 mg SubCUTAneous Q24H   ? pantoprazole (PROTONIX) tablet 40 mg  40 mg Oral DAILY   ? albuterol-ipratropium (DUO-NEB) 2.5 MG-0.5 MG/3 ML  3 mL Nebulization Q4H PRN   ? aspirin chewable tablet 81 mg  81 mg Oral DAILY   ? metoprolol (LOPRESSOR) tablet 50 mg  50 mg Oral Q12H   ? nicotine (NICODERM CQ) 21 mg/24 hr patch 1 Patch  1 Patch TransDERmal Q24H   ? [COMPLETED] sodium chloride (NS) 0.9 % flush       ? [DISCONTINUED] mylanta/donnatal/viscous lidocaine  (GI COCKTAIL)  50 mL Oral NOW        Review of Symptoms:  A comprehensive review of systems was negative except for: Respiratory: positive for asthma, wheezing or dyspnea on exertion  Cardiovascular: positive for chest pain, chest pressure/discomfort  Gastrointestinal: positive for reflux symptoms    Physical Exam    BP 163/86  Pulse 62  Temp(Src) 97.9 F (36.6 C)  Resp 18  Ht 5' (1.524 m)  Wt 195 lb 5.2 oz (88.6 kg)  BMI 38.15 kg/m2  SpO2 97%  Breastfeeding? No  BP 163/86  Pulse 62  Temp(Src) 97.9 F (36.6 C)  Resp 18  Ht 5' (1.524 m)  Wt 195 lb 5.2 oz (88.6 kg)  BMI 38.15 kg/m2  SpO2 97%  Breastfeeding? No  General Appearance:  Well developed, obeser and oriented x 3, individual in no acute distress.   Ears/Nose/Mouth/Throat:   Hearing grossly normal. Poor dentition        Neck: Supple. No jvd, no bruit   Chest:   Lungs faint wheeze bilaterally.   Cardiovascular:  Regular rate and rhythm, S1, S2 normal, no murmur.   Abdomen:   Soft, non-tender, bowel sounds are active.   Extremities: No edema bilaterally.    Skin: Warm and dry.       Cardiographics    Telemetry: normal sinus rhythm  ECG: normal sinus rhythm, LVH  Echocardiogram: stress echo pending    Recent radiology, intake/output and wt reviewed    Labs:   Recent Results (from the past 24 hour(s))   EKG, 12 LEAD, INITIAL    Collection Time     11/26/12  4:26 PM       Result Value Range    Ventricular Rate 83      Atrial Rate 83      P-R Interval 132      QRS Duration 96      Q-T Interval 396      QTC Calculation (Bezet) 465      Calculated P Axis 64      Calculated R Axis -47      Calculated T Axis 93      Diagnosis        Value: Normal sinus rhythm      Left anterior fascicular block      Left ventricular hypertrophy      No previous ECGs available   METABOLIC PANEL, COMPREHENSIVE    Collection Time  11/26/12  5:02 PM       Result Value Range    Sodium 140  136 - 145 mmol/L    Potassium 3.9  3.5 - 5.1 mmol/L    Chloride 110 (*) 97 - 108  mmol/L    CO2 23  21 - 32 mmol/L    Anion gap 7  5 - 15 mmol/L    Glucose 92  65 - 100 mg/dL    BUN 14  6 - 20 MG/DL    Creatinine 1.61  0.96 - 1.15 MG/DL    BUN/Creatinine ratio 16  12 - 20      GFR est AA >60  >60 ml/min/1.28m2    GFR est non-AA >60  >60 ml/min/1.61m2    Calcium 9.4  8.5 - 10.1 MG/DL    Bilirubin, total 0.4  0.2 - 1.0 MG/DL    ALT 25  12 - 78 U/L    AST 26  15 - 37 U/L    Alk. phosphatase 95  45 - 117 U/L    Protein, total 7.8  6.4 - 8.2 g/dL    Albumin 3.9  3.5 - 5.0 g/dL    Globulin 3.9  2.0 - 4.0 g/dL    A-G Ratio 1.0 (*) 1.1 - 2.2     CK W/ REFLX CKMB    Collection Time     11/26/12  5:02 PM       Result Value Range    CK 382 (*) 26 - 192 U/L   TROPONIN I    Collection Time     11/26/12  5:02 PM       Result Value Range    Troponin-I, Qt. <0.04  <0.05 ng/mL   CBC WITH AUTOMATED DIFF    Collection Time     11/26/12  5:02 PM       Result Value Range    WBC 3.7  3.6 - 11.0 K/uL    RBC 4.51  3.80 - 5.20 M/uL    HGB 13.4  11.5 - 16.0 g/dL    HCT 04.5  40.9 - 81.1 %    MCV 85.4  80.0 - 99.0 FL    MCH 29.7  26.0 - 34.0 PG    MCHC 34.8  30.0 - 36.5 g/dL    RDW 91.4  78.2 - 95.6 %    PLATELET 248  150 - 400 K/uL    NEUTROPHILS 42  32 - 75 %    LYMPHOCYTES 46  12 - 49 %    MONOCYTES 7  5 - 13 %    EOSINOPHILS 4  0 - 7 %    BASOPHILS 1  0 - 1 %    ABS. NEUTROPHILS 1.5 (*) 1.8 - 8.0 K/UL    ABS. LYMPHOCYTES 1.7  0.8 - 3.5 K/UL    ABS. MONOCYTES 0.3  0.0 - 1.0 K/UL    ABS. EOSINOPHILS 0.1  0.0 - 0.4 K/UL    ABS. BASOPHILS 0.0  0.0 - 0.1 K/UL   CK-MB,QUANT.    Collection Time     11/26/12  5:02 PM       Result Value Range    CK - MB 2.4  0.5 - 3.6 NG/ML    CK-MB Index 0.6  0 - 2.5     AMYLASE    Collection Time     11/26/12  5:02 PM       Result Value Range    Amylase 55  25 -  115 U/L   LIPASE    Collection Time     11/26/12  5:02 PM       Result Value Range    Lipase 190  73 - 393 U/L   BNP    Collection Time     11/26/12  5:02 PM       Result Value Range    BNP 24  0 - 100 pg/mL   D DIMER    Collection  Time     11/26/12  7:15 PM       Result Value Range    D-dimer 0.98 (*) 0.00 - 0.65 mg/L FEU   METABOLIC PANEL, COMPREHENSIVE    Collection Time     11/27/12  1:35 AM       Result Value Range    Sodium 140  136 - 145 mmol/L    Potassium 3.6  3.5 - 5.1 mmol/L    Chloride 110 (*) 97 - 108 mmol/L    CO2 25  21 - 32 mmol/L    Anion gap 5  5 - 15 mmol/L    Glucose 88  65 - 100 mg/dL    BUN 13  6 - 20 MG/DL    Creatinine 1.61  0.96 - 1.15 MG/DL    BUN/Creatinine ratio 14  12 - 20      GFR est AA >60  >60 ml/min/1.64m2    GFR est non-AA 57 (*) >60 ml/min/1.46m2    Calcium 8.8  8.5 - 10.1 MG/DL    Bilirubin, total 0.3  0.2 - 1.0 MG/DL    ALT 20  12 - 78 U/L    AST 16  15 - 37 U/L    Alk. phosphatase 78  45 - 117 U/L    Protein, total 6.7  6.4 - 8.2 g/dL    Albumin 3.4 (*) 3.5 - 5.0 g/dL    Globulin 3.3  2.0 - 4.0 g/dL    A-G Ratio 1.0 (*) 1.1 - 2.2     LIPID PANEL    Collection Time     11/27/12  1:35 AM       Result Value Range    LIPID PROFILE          Cholesterol, total 162  <200 MG/DL    Triglyceride 045  <409 MG/DL    HDL Cholesterol 59      LDL, calculated 74.4  0 - 811 MG/DL    VLDL, calculated 91.4      CHOL/HDL Ratio 2.7  0 - 5.0     CBC WITH AUTOMATED DIFF    Collection Time     11/27/12  1:35 AM       Result Value Range    WBC 4.1  3.6 - 11.0 K/uL    RBC 4.40  3.80 - 5.20 M/uL    HGB 13.0  11.5 - 16.0 g/dL    HCT 78.2  95.6 - 21.3 %    MCV 85.9  80.0 - 99.0 FL    MCH 29.5  26.0 - 34.0 PG    MCHC 34.4  30.0 - 36.5 g/dL    RDW 08.6  57.8 - 46.9 %    PLATELET 240  150 - 400 K/uL    NEUTROPHILS 32  32 - 75 %    LYMPHOCYTES 57 (*) 12 - 49 %    MONOCYTES 7  5 - 13 %    EOSINOPHILS 3  0 - 7 %    BASOPHILS  1  0 - 1 %    ABS. NEUTROPHILS 1.3 (*) 1.8 - 8.0 K/UL    ABS. LYMPHOCYTES 2.3  0.8 - 3.5 K/UL    ABS. MONOCYTES 0.3  0.0 - 1.0 K/UL    ABS. EOSINOPHILS 0.1  0.0 - 0.4 K/UL    ABS. BASOPHILS 0.0  0.0 - 0.1 K/UL   TROPONIN I    Collection Time     11/27/12  1:35 AM       Result Value Range    Troponin-I, Qt. <0.04  <0.05  ng/mL   CK W/ CKMB & INDEX    Collection Time     11/27/12  1:35 AM       Result Value Range    CK 287 (*) 26 - 192 U/L    CK - MB 2.2  0.5 - 3.6 NG/ML    CK-MB Index 0.8  0 - 2.5     TSH, 3RD GENERATION    Collection Time     11/27/12  1:35 AM       Result Value Range    TSH 2.61  0.36 - 3.74 uIU/mL   MAGNESIUM    Collection Time     11/27/12  1:35 AM       Result Value Range    Magnesium 1.9  1.6 - 2.4 mg/dL   PHOSPHORUS    Collection Time     11/27/12  1:35 AM       Result Value Range    Phosphorus 3.9  2.5 - 4.9 MG/DL   URINALYSIS W/MICROSCOPIC    Collection Time     11/27/12  6:37 AM       Result Value Range    Color YELLOW/STRAW      Appearance CLEAR  CLEAR      Specific gravity 1.014  1.003 - 1.030      pH (UA) 6.0  5.0 - 8.0      Protein NEGATIVE   NEG mg/dL    Glucose NEGATIVE   NEG mg/dL    Ketone NEGATIVE   NEG mg/dL    Bilirubin NEGATIVE   NEG      Blood NEGATIVE   NEG      Urobilinogen 0.2  0.2 - 1.0 EU/dL    Nitrites NEGATIVE   NEG      Leukocyte Esterase TRACE (*) NEG      WBC 5-10  0 - 4 /hpf    RBC 0-5  0 - 5 /hpf    Epithelial cells FEW  FEW /lpf    Bacteria NEGATIVE   NEG /hpf    Hyaline Cast 0-2  0 - 2             Electronically signed by Becky Augusta, MD at 11/27/2012 11:13 AM EDT

## 2012-11-27 NOTE — Unmapped (Signed)
Formatting of this note might be different from the original.  Cardiac Wellness:  Stress test educational pamphlet to the bedside along with smoking cessation pamphlet and resource sheet.     Reviewed testing and plan of care, discussed risk factors for heart disease including, previous stents, HTN, hyperlipidemia, smoking, diet, sedentary lifestyle and stress.  Briefly discussed the Cardiac Wellness program and discussed potential participation if she qualifies, she stated she has never participated in Cardiac Wellness and she would be interested.     Will continue to follow for educational needs Gavin PottersJill R Melia, RN  Electronically signed by Gavin PottersMelia, Jill R, RN at 11/27/2012  9:53 AM EDT

## 2012-11-27 NOTE — Progress Notes (Signed)
Formatting of this note might be different from the original.  Pt off floor, off tele to nuclear med. Metoprolol held this AM    1120: Pt on floor, on tele, confirmed with tech.  Electronically signed by Jeanmarie HubertMaloney, Renee L at 11/27/2012 11:35 AM EDT

## 2012-11-27 NOTE — Progress Notes (Signed)
Formatting of this note might be different from the original.  Bedside and Verbal shift change report given to Rolly SalterHaley RN (oncoming nurse) by Kathie Rhodes Maloney RN (offgoing nurse).  Report given with SBAR, Procedure Summary, Intake/Output, MAR and Recent Results.   Electronically signed by Jeanmarie HubertMaloney, Renee L at 11/27/2012  8:10 PM EDT

## 2012-11-27 NOTE — Progress Notes (Signed)
Formatting of this note is different from the original.  Images from the original note were not included.      Hospitalist Progress Note         Wendie SimmerEndalkachew Erena, MD  [facility] [phone]  Call physician on-call through the operator 7pm-7am    Daily Progress Note: 11/27/2012    Assessment/Plan:   1. Chest pain, hx of CAD s/p RCA stent in 2003, complains chest pain and shortness of breath, on aspirin and metoprolol, troponin <0.04 x2, ekg normal sinus rhythm vent rate 83 bpm non specific st t wave change, chest x ray no acute process, saturating 97-98% on RA, cardiologist is consulted    2. Hx of GERD, continue protonix   3. HTN continue metoprolol and monitor BP  4. Swelling of feet and ankle, d-dimer mildly elevated, doppler study no evidence of right or left lower extremities DVT  5. Hx of Asthma, feels some shortness but no cough, prn duo neb   6. Hx of OSA, continue CPAP q hs  7. Tobacco abused, on nicotine patch, advised to stop smoking      VTE prophylaxis lovenox   Disposition home       Subjective:   Complains chest pain and shortness of breath, no cough or diaphoresis    Review of Systems:   Pertinent items are noted in HPI.    Objective:   Physical Exam:     BP 144/55  Pulse 77  Temp(Src) 98 F (36.7 C)  Resp 18  Ht 5' (1.524 m)  Wt 88.6 kg (195 lb 5.2 oz)  BMI 38.15 kg/m2  SpO2 98%  Breastfeeding? No   O2 Device: Room air    Temp (24hrs), Avg:98.2 F (36.8 C), Min:98 F (36.7 C), Max:98.4 F (36.9 C)        05/25 1900 - 05/27 0659  In: 150 [P.O.:150]  Out: 300 [Urine:300]    General:  Alert, cooperative, no distress, appears stated age.   Lungs:   Scattered expiratory wheezing to auscultation bilaterally.   Chest wall:  No tenderness or deformity.   Heart:  Regular rate and rhythm, S1, S2 normal, no murmur, click, rub or gallop.   Abdomen:   Soft, non-tender. Bowel sounds normal. No masses,  No organomegaly.   Extremities: Trace pretibial and pedal edema.   Skin: No rashes or lesions    Neurologic: Conscious and well oriented, motor 5/5, CNII-XII intact.      Data Review:     Recent Days:  Recent Labs      11/27/12   0135  11/26/12   1702   WBC  4.1  3.7   HGB  13.0  13.4   HCT  37.8  38.5   PLT  240  248     Recent Labs      11/27/12   0135  11/26/12   1702   NA  140  140   K  3.6  3.9   CL  110*  110*   CO2  25  23   GLU  88  92   BUN  13  14   CREA  0.96  0.89   CA  8.8  9.4   MG  1.9   --    PHOS  3.9   --    ALB  3.4*  3.9   SGOT  16  26   ALT  20  25     No results found for this basename:  PH, PCO2, PO2, HCO3, FIO2,  in the last 72 hours    24 Hour Results:  Recent Results (from the past 24 hour(s))   EKG, 12 LEAD, INITIAL    Collection Time     11/26/12  4:26 PM       Result Value Range    Ventricular Rate 83      Atrial Rate 83      P-R Interval 132      QRS Duration 96      Q-T Interval 396      QTC Calculation (Bezet) 465      Calculated P Axis 64      Calculated R Axis -47      Calculated T Axis 93      Diagnosis        Value: Normal sinus rhythm      Left anterior fascicular block      Left ventricular hypertrophy      No previous ECGs available   METABOLIC PANEL, COMPREHENSIVE    Collection Time     11/26/12  5:02 PM       Result Value Range    Sodium 140  136 - 145 mmol/L    Potassium 3.9  3.5 - 5.1 mmol/L    Chloride 110 (*) 97 - 108 mmol/L    CO2 23  21 - 32 mmol/L    Anion gap 7  5 - 15 mmol/L    Glucose 92  65 - 100 mg/dL    BUN 14  6 - 20 MG/DL    Creatinine 8.65  7.84 - 1.15 MG/DL    BUN/Creatinine ratio 16  12 - 20      GFR est AA >60  >60 ml/min/1.7m2    GFR est non-AA >60  >60 ml/min/1.17m2    Calcium 9.4  8.5 - 10.1 MG/DL    Bilirubin, total 0.4  0.2 - 1.0 MG/DL    ALT 25  12 - 78 U/L    AST 26  15 - 37 U/L    Alk. phosphatase 95  45 - 117 U/L    Protein, total 7.8  6.4 - 8.2 g/dL    Albumin 3.9  3.5 - 5.0 g/dL    Globulin 3.9  2.0 - 4.0 g/dL    A-G Ratio 1.0 (*) 1.1 - 2.2     CK W/ REFLX CKMB    Collection Time     11/26/12  5:02 PM       Result Value Range    CK 382 (*)  26 - 192 U/L   TROPONIN I    Collection Time     11/26/12  5:02 PM       Result Value Range    Troponin-I, Qt. <0.04  <0.05 ng/mL   CBC WITH AUTOMATED DIFF    Collection Time     11/26/12  5:02 PM       Result Value Range    WBC 3.7  3.6 - 11.0 K/uL    RBC 4.51  3.80 - 5.20 M/uL    HGB 13.4  11.5 - 16.0 g/dL    HCT 69.6  29.5 - 28.4 %    MCV 85.4  80.0 - 99.0 FL    MCH 29.7  26.0 - 34.0 PG    MCHC 34.8  30.0 - 36.5 g/dL    RDW 13.2  44.0 - 10.2 %    PLATELET 248  150 - 400 K/uL  NEUTROPHILS 42  32 - 75 %    LYMPHOCYTES 46  12 - 49 %    MONOCYTES 7  5 - 13 %    EOSINOPHILS 4  0 - 7 %    BASOPHILS 1  0 - 1 %    ABS. NEUTROPHILS 1.5 (*) 1.8 - 8.0 K/UL    ABS. LYMPHOCYTES 1.7  0.8 - 3.5 K/UL    ABS. MONOCYTES 0.3  0.0 - 1.0 K/UL    ABS. EOSINOPHILS 0.1  0.0 - 0.4 K/UL    ABS. BASOPHILS 0.0  0.0 - 0.1 K/UL   CK-MB,QUANT.    Collection Time     11/26/12  5:02 PM       Result Value Range    CK - MB 2.4  0.5 - 3.6 NG/ML    CK-MB Index 0.6  0 - 2.5     AMYLASE    Collection Time     11/26/12  5:02 PM       Result Value Range    Amylase 55  25 - 115 U/L   LIPASE    Collection Time     11/26/12  5:02 PM       Result Value Range    Lipase 190  73 - 393 U/L   BNP    Collection Time     11/26/12  5:02 PM       Result Value Range    BNP 24  0 - 100 pg/mL   D DIMER    Collection Time     11/26/12  7:15 PM       Result Value Range    D-dimer 0.98 (*) 0.00 - 0.65 mg/L FEU   METABOLIC PANEL, COMPREHENSIVE    Collection Time     11/27/12  1:35 AM       Result Value Range    Sodium 140  136 - 145 mmol/L    Potassium 3.6  3.5 - 5.1 mmol/L    Chloride 110 (*) 97 - 108 mmol/L    CO2 25  21 - 32 mmol/L    Anion gap 5  5 - 15 mmol/L    Glucose 88  65 - 100 mg/dL    BUN 13  6 - 20 MG/DL    Creatinine 4.78  2.95 - 1.15 MG/DL    BUN/Creatinine ratio 14  12 - 20      GFR est AA >60  >60 ml/min/1.32m2    GFR est non-AA 57 (*) >60 ml/min/1.56m2    Calcium 8.8  8.5 - 10.1 MG/DL    Bilirubin, total 0.3  0.2 - 1.0 MG/DL    ALT 20  12 - 78 U/L     AST 16  15 - 37 U/L    Alk. phosphatase 78  45 - 117 U/L    Protein, total 6.7  6.4 - 8.2 g/dL    Albumin 3.4 (*) 3.5 - 5.0 g/dL    Globulin 3.3  2.0 - 4.0 g/dL    A-G Ratio 1.0 (*) 1.1 - 2.2     LIPID PANEL    Collection Time     11/27/12  1:35 AM       Result Value Range    LIPID PROFILE          Cholesterol, total 162  <200 MG/DL    Triglyceride 621  <308 MG/DL    HDL Cholesterol 59      LDL, calculated 74.4  0 -  100 MG/DL    VLDL, calculated 96.0      CHOL/HDL Ratio 2.7  0 - 5.0     CBC WITH AUTOMATED DIFF    Collection Time     11/27/12  1:35 AM       Result Value Range    WBC 4.1  3.6 - 11.0 K/uL    RBC 4.40  3.80 - 5.20 M/uL    HGB 13.0  11.5 - 16.0 g/dL    HCT 45.4  09.8 - 11.9 %    MCV 85.9  80.0 - 99.0 FL    MCH 29.5  26.0 - 34.0 PG    MCHC 34.4  30.0 - 36.5 g/dL    RDW 14.7  82.9 - 56.2 %    PLATELET 240  150 - 400 K/uL    NEUTROPHILS 32  32 - 75 %    LYMPHOCYTES 57 (*) 12 - 49 %    MONOCYTES 7  5 - 13 %    EOSINOPHILS 3  0 - 7 %    BASOPHILS 1  0 - 1 %    ABS. NEUTROPHILS 1.3 (*) 1.8 - 8.0 K/UL    ABS. LYMPHOCYTES 2.3  0.8 - 3.5 K/UL    ABS. MONOCYTES 0.3  0.0 - 1.0 K/UL    ABS. EOSINOPHILS 0.1  0.0 - 0.4 K/UL    ABS. BASOPHILS 0.0  0.0 - 0.1 K/UL   TROPONIN I    Collection Time     11/27/12  1:35 AM       Result Value Range    Troponin-I, Qt. <0.04  <0.05 ng/mL   CK W/ CKMB & INDEX    Collection Time     11/27/12  1:35 AM       Result Value Range    CK 287 (*) 26 - 192 U/L    CK - MB 2.2  0.5 - 3.6 NG/ML    CK-MB Index 0.8  0 - 2.5     TSH, 3RD GENERATION    Collection Time     11/27/12  1:35 AM       Result Value Range    TSH 2.61  0.36 - 3.74 uIU/mL   MAGNESIUM    Collection Time     11/27/12  1:35 AM       Result Value Range    Magnesium 1.9  1.6 - 2.4 mg/dL   PHOSPHORUS    Collection Time     11/27/12  1:35 AM       Result Value Range    Phosphorus 3.9  2.5 - 4.9 MG/DL   URINALYSIS W/MICROSCOPIC    Collection Time     11/27/12  6:37 AM       Result Value Range    Color YELLOW/STRAW      Appearance  CLEAR  CLEAR      Specific gravity 1.014  1.003 - 1.030      pH (UA) 6.0  5.0 - 8.0      Protein NEGATIVE   NEG mg/dL    Glucose NEGATIVE   NEG mg/dL    Ketone NEGATIVE   NEG mg/dL    Bilirubin NEGATIVE   NEG      Blood NEGATIVE   NEG      Urobilinogen 0.2  0.2 - 1.0 EU/dL    Nitrites NEGATIVE   NEG      Leukocyte Esterase TRACE (*) NEG      WBC 5-10  0 - 4 /hpf    RBC 0-5  0 - 5 /hpf    Epithelial cells FEW  FEW /lpf    Bacteria NEGATIVE   NEG /hpf    Hyaline Cast 0-2  0 - 2     Problem List:  Problem List as of 11/27/2012 Date Reviewed: 11/26/2012        ICD-9-CM Class Noted - Resolved    *Chest pain 786.50  11/26/2012 - Present         Medications reviewed  Current Facility-Administered Medications   Medication Dose Route Frequency   ? [COMPLETED] sodium chloride (NS) 0.9 % flush       ? [START ON 11/28/2012] influenza vaccine 2013-14(12yr+)(PF) (FLUZONE,FLUARIX) injection 0.5 mL  0.5 mL IntraMUSCular PRIOR TO DISCHARGE   ? [COMPLETED] aspirin chewable tablet 162 mg  162 mg Oral NOW   ? acetaminophen (TYLENOL) tablet 650 mg  650 mg Oral Q4H PRN   ? HYDROcodone-acetaminophen (NORCO) 5-325 mg per tablet 1 Tab  1 Tab Oral Q4H PRN   ? morphine injection 2 mg  2 mg IntraVENous Q4H PRN   ? acetaminophen (TYLENOL) tablet 650 mg  650 mg Oral Q4H PRN   ? ondansetron (ZOFRAN) injection 4 mg  4 mg IntraVENous Q4H PRN   ? bisacodyl (DULCOLAX) suppository 10 mg  10 mg Rectal DAILY PRN   ? bisacodyl (DULCOLAX) tablet 5 mg  5 mg Oral DAILY PRN   ? docusate sodium (COLACE) capsule 100 mg  100 mg Oral BID   ? enoxaparin (LOVENOX) injection 40 mg  40 mg SubCUTAneous Q24H   ? pantoprazole (PROTONIX) tablet 40 mg  40 mg Oral DAILY   ? albuterol-ipratropium (DUO-NEB) 2.5 MG-0.5 MG/3 ML  3 mL Nebulization Q4H PRN   ? aspirin chewable tablet 81 mg  81 mg Oral DAILY   ? metoprolol (LOPRESSOR) tablet 50 mg  50 mg Oral Q12H   ? nicotine (NICODERM CQ) 21 mg/24 hr patch 1 Patch  1 Patch TransDERmal Q24H   ? [COMPLETED] sodium chloride (NS) 0.9 %  flush         Care Plan discussed with: Patient/Family and Nurse    Total time spent with patient: 30 minutes.    Ladoris Gene, MD  Electronically signed by Ladoris Gene, MD at 11/27/2012  9:36 AM EDT

## 2012-11-27 NOTE — Progress Notes (Signed)
Formatting of this note might be different from the original.  Interdisciplinary team rounds were held 11/27/2012 with the following team members:Care Management and Nursing.    Plan of care discussed. See clinical pathway and/or care plan for interventions and desired outcomes.    Electronically signed by Ella JubileeKeiser, Joan P, MSW at 11/27/2012  9:10 AM EDT

## 2012-11-27 NOTE — Procedures (Signed)
[facility]  *** FINAL REPORT ***    Name: Tanya [name], Tanya [name]  MRN: [id number]  DOB: [birth year]  HIS Order #: [id number]  TRAKnet Visit #: [id number]  Date: 26 Nov 2012    TYPE OF TEST: Peripheral Venous Testing    REASON FOR TEST  Suspected pulmonary embolism    Right Leg:-  Deep venous thrombosis:           No  Superficial venous thrombosis:    No  Deep venous insufficiency:        Not examined  Superficial venous insufficiency: Not examined    Left Leg:-  Deep venous thrombosis:           No  Superficial venous thrombosis:    No  Deep venous insufficiency:        Not examined  Superficial venous insufficiency: Not examined      INTERPRETATION/FINDINGS  PROCEDURE:  Color duplex ultrasound imaging of lower extremity veins.    FINDINGS:       Right: The common femoral, deep femoral, femoral, popliteal,  posterior tibial, peroneal, and great saphenous are visualized; each  is compressible and no narrowing of the flow channel on color Doppler  imaging is observed.  Phasic flow is observed in the common femoral  vein.       Left:   The common femoral, deep femoral, femoral, popliteal,  posterior tibial, peroneal, and great saphenous are visualized; each  is compressible and no narrowing of the flow channel on color Doppler  imaging is observed.  Phasic flow is observed in the common femoral  vein.    IMPRESSION:  No evidence of right or left lower extremity vein  thrombosis.    ADDITIONAL COMMENTS    I have personally reviewed the data relevant to the interpretation of  this  study.    TECHNOLOGIST: Moshe Salisbury, RVT  Signed: 11/26/2012 10:00 PM    PHYSICIAN: Roylene Reason., MD  Signed: 11/27/2012 07:46 AM

## 2012-11-27 NOTE — Progress Notes (Signed)
Formatting of this note might be different from the original.  CM reviewed chart. Met with patient, introduced self and CM role and offered support. Patient states she lives alone in [place], [place] and was visiting her granddaughter prior to admission. States she is planning on moving to [place] in June  to be closer to her son. States her PCP is Chauncey FischerDonna Ambrose in [place], but will be changing when she moves.  Patient states she is independent with her ADL's, but does not drive. States she has close friends that will assist her with her transportation. She states her granddaughter will provide her transportation to home at discharge. States [place] in rehab in [place], and had home health services, but doesn't remember name of home health agency.  Denies DME needs.  Patient does not have a Advanced Directive, but states will speak to sone about getting this done when she moves.  Patient advised she can contact [facility] that can assist with answering questions.  She verbalized understanding. Patient also advised of Bedside Rx at discharge.   No discharge needs identified at this time. Disposition plan is discharge to home with family and physician followup.  CM will continue to follow as needed. Royston CowperPaula Brewer, RN/ CRM  Electronically signed by Lynann BolognaBrewer, Paula W at 11/27/2012 12:21 PM EDT

## 2012-11-27 NOTE — Progress Notes (Signed)
Interdisciplinary team rounds were held 11/27/2012 with the following team members:Care Management and Nursing.    Plan of care discussed. See clinical pathway and/or care plan for interventions and desired outcomes.

## 2012-11-27 NOTE — Progress Notes (Signed)
Bedside and Verbal shift change report given to Rolly Salter RN (oncoming nurse) by Kathie Rhodes RN (offgoing nurse).  Report given with SBAR, Procedure Summary, Intake/Output, MAR and Recent Results.

## 2012-11-27 NOTE — Procedures (Signed)
[facility]  *** FINAL REPORT ***    Name: [name], Tanya  MRN: [id number]  DOB: 25 Feb 1941  HIS Order #: [id number]  TRAKnet Visit #: [id number]  Date: 26 Nov 2012    TYPE OF TEST: Peripheral Venous Testing    REASON FOR TEST  Suspected pulmonary embolism    Right Leg:-  Deep venous thrombosis:           No  Superficial venous thrombosis:    No  Deep venous insufficiency:        Not examined  Superficial venous insufficiency: Not examined    Left Leg:-  Deep venous thrombosis:           No  Superficial venous thrombosis:    No  Deep venous insufficiency:        Not examined  Superficial venous insufficiency: Not examined      INTERPRETATION/FINDINGS  PROCEDURE:  Color duplex ultrasound imaging of lower extremity veins.    FINDINGS:       Right: The common femoral, deep femoral, femoral, popliteal,  posterior tibial, peroneal, and great saphenous are visualized; each  is compressible and no narrowing of the flow channel on color Doppler  imaging is observed.  Phasic flow is observed in the common femoral  vein.       Left:   The common femoral, deep femoral, femoral, popliteal,  posterior tibial, peroneal, and great saphenous are visualized; each  is compressible and no narrowing of the flow channel on color Doppler  imaging is observed.  Phasic flow is observed in the common femoral  vein.    IMPRESSION:  No evidence of right or left lower extremity vein  thrombosis.    ADDITIONAL COMMENTS    I have personally reviewed the data relevant to the interpretation of  this  study.    TECHNOLOGIST: Thomas Back, RVT  Signed: 11/26/2012 10:00 PM    PHYSICIAN: Suhail Peloquin Stoneburner, Jr., MD  Signed: 11/27/2012 07:46 AM

## 2012-11-27 NOTE — Other (Signed)
Cardiac Wellness:  Stress test educational pamphlet to the bedside along with smoking cessation pamphlet and resource sheet.     Reviewed testing and plan of care, discussed risk factors for heart disease including, previous stents, HTN, hyperlipidemia, smoking, diet, sedentary lifestyle and stress.  Briefly discussed the Cardiac Wellness program and discussed potential participation if she qualifies, she stated she has never participated in Cardiac Wellness and she would be interested.     Will continue to follow for educational needs Gavin Potters, RN

## 2012-11-27 NOTE — Consults (Addendum)
Date of  Admission: 11/26/2012  4:24 PM     Tanya [name] is a 72 y.o. female admitted for Chest pain  Subjective:  Tanya [name] is here visiting her grandchildren and developed chest pain.  Her symptoms include SOB, chest burning, similar to her GERD pain.  She has hx including CAD, HTN, no exercise, obesity.  Soc: tobacco abuse, marijuana abuse, social ETOH    reports chest pain, DOE, GERD like sx.  Cardiac risk factors: smoking/ tobacco exposure, dyslipidemia, diabetes mellitus, obesity, sedentary life style, hypertension, post-menopausal.    Assessment/Plan:  1.  Unstable angina- troponin negative x2, VQ negative for PE  --will get dobutamine stress echo today, can not do nuclear scan as she had VQ yesterday, BNP 24  2.  CAD--hx stent in 2002, done in [place], does not follow with cardiology, continue asa, BB, restart home statin, isosorbide and plavix  3.  HTN--suboptimal control, on metoprolol, restart home lisinopril  4.  Obesity--Body mass index is 38.15 kg/(m^2).   5.  Asthma--uses inhalers  6.  OSA--uses CPAP at home, no oxygen bled into machine at present but did in the past  7.  Tobacco abuse--1/4-1/2PPD. counseled to quit, continue nicoderm patch  8.  Marijuana abuse--counseled to quit  9.  LE dopplers--negative for DVT  10.  GERD--per primary team  11.  Dyslipidemia--TG 143 TC 162 HDL 59 LDL 74, restart home mevacor  12.  Hypothyroidism--takes synthroid at home, defer to primary team to restart, TSH 2.61  13.  Get a1c    Saw and evaluated pt and agree with above assessment and plan.  Chest pains with typical and atypical features.  No evidence of acute ischemia.  Cannot obtain nuclear stress test for 2 days as V/Q scan done this AM; will obtain dobutamine stress echo for further evaluation.     ADDENDUM: stress test was normal.  No further cardiac evaluation indicated and will sign off.  Recommend pt f/u outpt with her local cardiologist.  Becky Augusta, MD      Patient Active Problem List    Diagnosis  Date Noted   ??? Chest pain 11/26/2012      Chauncey Fischer, NP  Past Medical History   Diagnosis Date   ??? CAD (coronary artery disease)    ??? Asthma    ??? Hypertension    ??? Acid reflux       Past Surgical History   Procedure Laterality Date   ??? Pr cardiac surg procedure unlist       Stents   ??? Hx orthopaedic       right knee replacement     Allergies   Allergen Reactions   ??? Iodinated Contrast Media - Iv Dye Unknown (comments)   ??? Pcn (Penicillins) Hives      History reviewed. No pertinent family history.   Current Facility-Administered Medications   Medication Dose Route Frequency   ??? [START ON 11/28/2012] influenza vaccine 2013-14(64yr+)(PF) (FLUZONE,FLUARIX) injection 0.5 mL  0.5 mL IntraMUSCular PRIOR TO DISCHARGE   ??? [COMPLETED] sodium chloride (NS) 0.9 % flush       ??? [COMPLETED] aspirin chewable tablet 162 mg  162 mg Oral NOW   ??? acetaminophen (TYLENOL) tablet 650 mg  650 mg Oral Q4H PRN   ??? HYDROcodone-acetaminophen (NORCO) 5-325 mg per tablet 1 Tab  1 Tab Oral Q4H PRN   ??? morphine injection 2 mg  2 mg IntraVENous Q4H PRN   ??? acetaminophen (TYLENOL) tablet 650 mg  650 mg  Oral Q4H PRN   ??? ondansetron (ZOFRAN) injection 4 mg  4 mg IntraVENous Q4H PRN   ??? bisacodyl (DULCOLAX) suppository 10 mg  10 mg Rectal DAILY PRN   ??? bisacodyl (DULCOLAX) tablet 5 mg  5 mg Oral DAILY PRN   ??? docusate sodium (COLACE) capsule 100 mg  100 mg Oral BID   ??? enoxaparin (LOVENOX) injection 40 mg  40 mg SubCUTAneous Q24H   ??? pantoprazole (PROTONIX) tablet 40 mg  40 mg Oral DAILY   ??? albuterol-ipratropium (DUO-NEB) 2.5 MG-0.5 MG/3 ML  3 mL Nebulization Q4H PRN   ??? aspirin chewable tablet 81 mg  81 mg Oral DAILY   ??? metoprolol (LOPRESSOR) tablet 50 mg  50 mg Oral Q12H   ??? nicotine (NICODERM CQ) 21 mg/24 hr patch 1 Patch  1 Patch TransDERmal Q24H   ??? [COMPLETED] sodium chloride (NS) 0.9 % flush       ??? [DISCONTINUED] mylanta/donnatal/viscous lidocaine (GI COCKTAIL)  50 mL Oral NOW         Review of Symptoms:  A comprehensive review of systems  was negative except for: Respiratory: positive for asthma, wheezing or dyspnea on exertion  Cardiovascular: positive for chest pain, chest pressure/discomfort  Gastrointestinal: positive for reflux symptoms    Physical Exam    BP 163/86   Pulse 62   Temp(Src) 97.9 ??F (36.6 ??C)   Resp 18   Ht 5' (1.524 m)   Wt 195 lb 5.2 oz (88.6 kg)   BMI 38.15 kg/m2   SpO2 97%   Breastfeeding? No  BP 163/86   Pulse 62   Temp(Src) 97.9 ??F (36.6 ??C)   Resp 18   Ht 5' (1.524 m)   Wt 195 lb 5.2 oz (88.6 kg)   BMI 38.15 kg/m2   SpO2 97%   Breastfeeding? No  General Appearance:  Well developed, obeser and oriented x 3, individual in no acute distress.   Ears/Nose/Mouth/Throat:   Hearing grossly normal. Poor dentition         Neck: Supple. No jvd, no bruit   Chest:   Lungs faint wheeze bilaterally.   Cardiovascular:  Regular rate and rhythm, S1, S2 normal, no murmur.   Abdomen:   Soft, non-tender, bowel sounds are active.   Extremities: No edema bilaterally.    Skin: Warm and dry.           Cardiographics    Telemetry: normal sinus rhythm  ECG: normal sinus rhythm, LVH  Echocardiogram: stress echo pending    Recent radiology, intake/output and wt reviewed    Labs:   Recent Results (from the past 24 hour(s))   EKG, 12 LEAD, INITIAL    Collection Time     11/26/12  4:26 PM       Result Value Range    Ventricular Rate 83      Atrial Rate 83      P-R Interval 132      QRS Duration 96      Q-T Interval 396      QTC Calculation (Bezet) 465      Calculated P Axis 64      Calculated R Axis -47      Calculated T Axis 93      Diagnosis        Value: Normal sinus rhythm      Left anterior fascicular block      Left ventricular hypertrophy      No previous ECGs available   METABOLIC PANEL,  COMPREHENSIVE    Collection Time     11/26/12  5:02 PM       Result Value Range    Sodium 140  136 - 145 mmol/L    Potassium 3.9  3.5 - 5.1 mmol/L    Chloride 110 (*) 97 - 108 mmol/L    CO2 23  21 - 32 mmol/L    Anion gap 7  5 - 15 mmol/L    Glucose 92  65 - 100  mg/dL    BUN 14  6 - 20 MG/DL    Creatinine 1.61  0.96 - 1.15 MG/DL    BUN/Creatinine ratio 16  12 - 20      GFR est AA >60  >60 ml/min/1.59m2    GFR est non-AA >60  >60 ml/min/1.15m2    Calcium 9.4  8.5 - 10.1 MG/DL    Bilirubin, total 0.4  0.2 - 1.0 MG/DL    ALT 25  12 - 78 U/L    AST 26  15 - 37 U/L    Alk. phosphatase 95  45 - 117 U/L    Protein, total 7.8  6.4 - 8.2 g/dL    Albumin 3.9  3.5 - 5.0 g/dL    Globulin 3.9  2.0 - 4.0 g/dL    A-G Ratio 1.0 (*) 1.1 - 2.2     CK W/ REFLX CKMB    Collection Time     11/26/12  5:02 PM       Result Value Range    CK 382 (*) 26 - 192 U/L   TROPONIN I    Collection Time     11/26/12  5:02 PM       Result Value Range    Troponin-I, Qt. <0.04  <0.05 ng/mL   CBC WITH AUTOMATED DIFF    Collection Time     11/26/12  5:02 PM       Result Value Range    WBC 3.7  3.6 - 11.0 K/uL    RBC 4.51  3.80 - 5.20 M/uL    HGB 13.4  11.5 - 16.0 g/dL    HCT 04.5  40.9 - 81.1 %    MCV 85.4  80.0 - 99.0 FL    MCH 29.7  26.0 - 34.0 PG    MCHC 34.8  30.0 - 36.5 g/dL    RDW 91.4  78.2 - 95.6 %    PLATELET 248  150 - 400 K/uL    NEUTROPHILS 42  32 - 75 %    LYMPHOCYTES 46  12 - 49 %    MONOCYTES 7  5 - 13 %    EOSINOPHILS 4  0 - 7 %    BASOPHILS 1  0 - 1 %    ABS. NEUTROPHILS 1.5 (*) 1.8 - 8.0 K/UL    ABS. LYMPHOCYTES 1.7  0.8 - 3.5 K/UL    ABS. MONOCYTES 0.3  0.0 - 1.0 K/UL    ABS. EOSINOPHILS 0.1  0.0 - 0.4 K/UL    ABS. BASOPHILS 0.0  0.0 - 0.1 K/UL   CK-MB,QUANT.    Collection Time     11/26/12  5:02 PM       Result Value Range    CK - MB 2.4  0.5 - 3.6 NG/ML    CK-MB Index 0.6  0 - 2.5     AMYLASE    Collection Time     11/26/12  5:02 PM       Result  Value Range    Amylase 55  25 - 115 U/L   LIPASE    Collection Time     11/26/12  5:02 PM       Result Value Range    Lipase 190  73 - 393 U/L   BNP    Collection Time     11/26/12  5:02 PM       Result Value Range    BNP 24  0 - 100 pg/mL   D DIMER    Collection Time     11/26/12  7:15 PM       Result Value Range    D-dimer 0.98 (*) 0.00 - 0.65 mg/L  FEU   METABOLIC PANEL, COMPREHENSIVE    Collection Time     11/27/12  1:35 AM       Result Value Range    Sodium 140  136 - 145 mmol/L    Potassium 3.6  3.5 - 5.1 mmol/L    Chloride 110 (*) 97 - 108 mmol/L    CO2 25  21 - 32 mmol/L    Anion gap 5  5 - 15 mmol/L    Glucose 88  65 - 100 mg/dL    BUN 13  6 - 20 MG/DL    Creatinine 1.61  0.96 - 1.15 MG/DL    BUN/Creatinine ratio 14  12 - 20      GFR est AA >60  >60 ml/min/1.29m2    GFR est non-AA 57 (*) >60 ml/min/1.18m2    Calcium 8.8  8.5 - 10.1 MG/DL    Bilirubin, total 0.3  0.2 - 1.0 MG/DL    ALT 20  12 - 78 U/L    AST 16  15 - 37 U/L    Alk. phosphatase 78  45 - 117 U/L    Protein, total 6.7  6.4 - 8.2 g/dL    Albumin 3.4 (*) 3.5 - 5.0 g/dL    Globulin 3.3  2.0 - 4.0 g/dL    A-G Ratio 1.0 (*) 1.1 - 2.2     LIPID PANEL    Collection Time     11/27/12  1:35 AM       Result Value Range    LIPID PROFILE          Cholesterol, total 162  <200 MG/DL    Triglyceride 045  <409 MG/DL    HDL Cholesterol 59      LDL, calculated 74.4  0 - 811 MG/DL    VLDL, calculated 91.4      CHOL/HDL Ratio 2.7  0 - 5.0     CBC WITH AUTOMATED DIFF    Collection Time     11/27/12  1:35 AM       Result Value Range    WBC 4.1  3.6 - 11.0 K/uL    RBC 4.40  3.80 - 5.20 M/uL    HGB 13.0  11.5 - 16.0 g/dL    HCT 78.2  95.6 - 21.3 %    MCV 85.9  80.0 - 99.0 FL    MCH 29.5  26.0 - 34.0 PG    MCHC 34.4  30.0 - 36.5 g/dL    RDW 08.6  57.8 - 46.9 %    PLATELET 240  150 - 400 K/uL    NEUTROPHILS 32  32 - 75 %    LYMPHOCYTES 57 (*) 12 - 49 %    MONOCYTES 7  5 - 13 %    EOSINOPHILS  3  0 - 7 %    BASOPHILS 1  0 - 1 %    ABS. NEUTROPHILS 1.3 (*) 1.8 - 8.0 K/UL    ABS. LYMPHOCYTES 2.3  0.8 - 3.5 K/UL    ABS. MONOCYTES 0.3  0.0 - 1.0 K/UL    ABS. EOSINOPHILS 0.1  0.0 - 0.4 K/UL    ABS. BASOPHILS 0.0  0.0 - 0.1 K/UL   TROPONIN I    Collection Time     11/27/12  1:35 AM       Result Value Range    Troponin-I, Qt. <0.04  <0.05 ng/mL   CK W/ CKMB & INDEX    Collection Time     11/27/12  1:35 AM       Result Value  Range    CK 287 (*) 26 - 192 U/L    CK - MB 2.2  0.5 - 3.6 NG/ML    CK-MB Index 0.8  0 - 2.5     TSH, 3RD GENERATION    Collection Time     11/27/12  1:35 AM       Result Value Range    TSH 2.61  0.36 - 3.74 uIU/mL   MAGNESIUM    Collection Time     11/27/12  1:35 AM       Result Value Range    Magnesium 1.9  1.6 - 2.4 mg/dL   PHOSPHORUS    Collection Time     11/27/12  1:35 AM       Result Value Range    Phosphorus 3.9  2.5 - 4.9 MG/DL   URINALYSIS W/MICROSCOPIC    Collection Time     11/27/12  6:37 AM       Result Value Range    Color YELLOW/STRAW      Appearance CLEAR  CLEAR      Specific gravity 1.014  1.003 - 1.030      pH (UA) 6.0  5.0 - 8.0      Protein NEGATIVE   NEG mg/dL    Glucose NEGATIVE   NEG mg/dL    Ketone NEGATIVE   NEG mg/dL    Bilirubin NEGATIVE   NEG      Blood NEGATIVE   NEG      Urobilinogen 0.2  0.2 - 1.0 EU/dL    Nitrites NEGATIVE   NEG      Leukocyte Esterase TRACE (*) NEG      WBC 5-10  0 - 4 /hpf    RBC 0-5  0 - 5 /hpf    Epithelial cells FEW  FEW /lpf    Bacteria NEGATIVE   NEG /hpf    Hyaline Cast 0-2  0 - 2

## 2012-11-27 NOTE — Progress Notes (Signed)
CM reviewed chart. Met with patient, introduced self and CM role and offered support. Patient states she lives alone in [place], [place] and was visiting her granddaughter prior to admission. States she is planning on moving to [place] in June  to be closer to her son. States her PCP is Chauncey Fischer in [place], but will be changing when she moves.  Patient states she is independent with her ADL's, but does not drive. States she has close friends that will assist her with her transportation. She states her granddaughter will provide her transportation to home at discharge. States was in rehab in [place] after knee surgery, and had home health services, but doesn't remember name of home health agency.  Denies DME needs.  Patient does not have a Advanced Directive, but states will speak to sone about getting this done when she moves.  Patient advised she can contact [facility] that can assist with answering questions.  She verbalized understanding. Patient also advised of Bedside Rx at discharge.   No discharge needs identified at this time. Disposition plan is discharge to home with family and physician followup.  CM will continue to follow as needed. Royston Cowper, RN/ CRM

## 2012-11-27 NOTE — Progress Notes (Addendum)
Pt off floor, off tele to nuclear med. Metoprolol held this AM    1120: Pt on floor, on tele, confirmed with tech.

## 2012-11-27 NOTE — Progress Notes (Signed)
Hospitalist Progress Note         Wendie Simmer, MD  [facility] [phone]  Call physician on-call through the operator 7pm-7am    Daily Progress Note: 11/27/2012    Assessment/Plan:   1. Chest pain, hx of CAD s/p RCA stent in 2003, complains chest pain and shortness of breath, on aspirin and metoprolol, troponin <0.04 x2, ekg normal sinus rhythm vent rate 83 bpm non specific st t wave change, chest x ray no acute process, saturating 97-98% on RA, cardiologist is consulted    2. Hx of GERD, continue protonix   3. HTN continue metoprolol and monitor BP  4. Swelling of feet and ankle, d-dimer mildly elevated, doppler study no evidence of right or left lower extremities DVT  5. Hx of Asthma, feels some shortness but no cough, prn duo neb   6. Hx of OSA, continue CPAP q hs  7. Tobacco abused, on nicotine patch, advised to stop smoking      VTE prophylaxis lovenox   Disposition home          Subjective:   Complains chest pain and shortness of breath, no cough or diaphoresis    Review of Systems:   Pertinent items are noted in HPI.    Objective:   Physical Exam:     BP 144/55   Pulse 77   Temp(Src) 98 ??F (36.7 ??C)   Resp 18   Ht 5' (1.524 m)   Wt 88.6 kg (195 lb 5.2 oz)   BMI 38.15 kg/m2   SpO2 98%   Breastfeeding? No   O2 Device: Room air    Temp (24hrs), Avg:98.2 ??F (36.8 ??C), Min:98 ??F (36.7 ??C), Max:98.4 ??F (36.9 ??C)        05/25 1900 - 05/27 0659  In: 150 [P.O.:150]  Out: 300 [Urine:300]    General:  Alert, cooperative, no distress, appears stated age.   Lungs:   Scattered expiratory wheezing to auscultation bilaterally.   Chest wall:  No tenderness or deformity.   Heart:  Regular rate and rhythm, S1, S2 normal, no murmur, click, rub or gallop.   Abdomen:   Soft, non-tender. Bowel sounds normal. No masses,  No organomegaly.   Extremities: Trace pretibial and pedal edema.   Skin: No rashes or lesions   Neurologic: Conscious and well oriented, motor 5/5, CNII-XII intact.      Data Review:        Recent Days:  Recent Labs      11/27/12   0135  11/26/12   1702   WBC  4.1  3.7   HGB  13.0  13.4   HCT  37.8  38.5   PLT  240  248     Recent Labs      11/27/12   0135  11/26/12   1702   NA  140  140   K  3.6  3.9   CL  110*  110*   CO2  25  23   GLU  88  92   BUN  13  14   CREA  0.96  0.89   CA  8.8  9.4   MG  1.9   --    PHOS  3.9   --    ALB  3.4*  3.9   SGOT  16  26   ALT  20  25     No results found for this basename: PH, PCO2, PO2, HCO3, FIO2,  in the last 72  hours    24 Hour Results:  Recent Results (from the past 24 hour(s))   EKG, 12 LEAD, INITIAL    Collection Time     11/26/12  4:26 PM       Result Value Range    Ventricular Rate 83      Atrial Rate 83      P-R Interval 132      QRS Duration 96      Q-T Interval 396      QTC Calculation (Bezet) 465      Calculated P Axis 64      Calculated R Axis -47      Calculated T Axis 93      Diagnosis        Value: Normal sinus rhythm      Left anterior fascicular block      Left ventricular hypertrophy      No previous ECGs available   METABOLIC PANEL, COMPREHENSIVE    Collection Time     11/26/12  5:02 PM       Result Value Range    Sodium 140  136 - 145 mmol/L    Potassium 3.9  3.5 - 5.1 mmol/L    Chloride 110 (*) 97 - 108 mmol/L    CO2 23  21 - 32 mmol/L    Anion gap 7  5 - 15 mmol/L    Glucose 92  65 - 100 mg/dL    BUN 14  6 - 20 MG/DL    Creatinine 2.13  0.86 - 1.15 MG/DL    BUN/Creatinine ratio 16  12 - 20      GFR est AA >60  >60 ml/min/1.46m2    GFR est non-AA >60  >60 ml/min/1.75m2    Calcium 9.4  8.5 - 10.1 MG/DL    Bilirubin, total 0.4  0.2 - 1.0 MG/DL    ALT 25  12 - 78 U/L    AST 26  15 - 37 U/L    Alk. phosphatase 95  45 - 117 U/L    Protein, total 7.8  6.4 - 8.2 g/dL    Albumin 3.9  3.5 - 5.0 g/dL    Globulin 3.9  2.0 - 4.0 g/dL    A-G Ratio 1.0 (*) 1.1 - 2.2     CK W/ REFLX CKMB    Collection Time     11/26/12  5:02 PM       Result Value Range    CK 382 (*) 26 - 192 U/L   TROPONIN I    Collection Time     11/26/12  5:02 PM       Result Value  Range    Troponin-I, Qt. <0.04  <0.05 ng/mL   CBC WITH AUTOMATED DIFF    Collection Time     11/26/12  5:02 PM       Result Value Range    WBC 3.7  3.6 - 11.0 K/uL    RBC 4.51  3.80 - 5.20 M/uL    HGB 13.4  11.5 - 16.0 g/dL    HCT 57.8  46.9 - 62.9 %    MCV 85.4  80.0 - 99.0 FL    MCH 29.7  26.0 - 34.0 PG    MCHC 34.8  30.0 - 36.5 g/dL    RDW 52.8  41.3 - 24.4 %    PLATELET 248  150 - 400 K/uL    NEUTROPHILS 42  32 - 75 %  LYMPHOCYTES 46  12 - 49 %    MONOCYTES 7  5 - 13 %    EOSINOPHILS 4  0 - 7 %    BASOPHILS 1  0 - 1 %    ABS. NEUTROPHILS 1.5 (*) 1.8 - 8.0 K/UL    ABS. LYMPHOCYTES 1.7  0.8 - 3.5 K/UL    ABS. MONOCYTES 0.3  0.0 - 1.0 K/UL    ABS. EOSINOPHILS 0.1  0.0 - 0.4 K/UL    ABS. BASOPHILS 0.0  0.0 - 0.1 K/UL   CK-MB,QUANT.    Collection Time     11/26/12  5:02 PM       Result Value Range    CK - MB 2.4  0.5 - 3.6 NG/ML    CK-MB Index 0.6  0 - 2.5     AMYLASE    Collection Time     11/26/12  5:02 PM       Result Value Range    Amylase 55  25 - 115 U/L   LIPASE    Collection Time     11/26/12  5:02 PM       Result Value Range    Lipase 190  73 - 393 U/L   BNP    Collection Time     11/26/12  5:02 PM       Result Value Range    BNP 24  0 - 100 pg/mL   D DIMER    Collection Time     11/26/12  7:15 PM       Result Value Range    D-dimer 0.98 (*) 0.00 - 0.65 mg/L FEU   METABOLIC PANEL, COMPREHENSIVE    Collection Time     11/27/12  1:35 AM       Result Value Range    Sodium 140  136 - 145 mmol/L    Potassium 3.6  3.5 - 5.1 mmol/L    Chloride 110 (*) 97 - 108 mmol/L    CO2 25  21 - 32 mmol/L    Anion gap 5  5 - 15 mmol/L    Glucose 88  65 - 100 mg/dL    BUN 13  6 - 20 MG/DL    Creatinine 1.61  0.96 - 1.15 MG/DL    BUN/Creatinine ratio 14  12 - 20      GFR est AA >60  >60 ml/min/1.15m2    GFR est non-AA 57 (*) >60 ml/min/1.22m2    Calcium 8.8  8.5 - 10.1 MG/DL    Bilirubin, total 0.3  0.2 - 1.0 MG/DL    ALT 20  12 - 78 U/L    AST 16  15 - 37 U/L    Alk. phosphatase 78  45 - 117 U/L    Protein, total 6.7  6.4 -  8.2 g/dL    Albumin 3.4 (*) 3.5 - 5.0 g/dL    Globulin 3.3  2.0 - 4.0 g/dL    A-G Ratio 1.0 (*) 1.1 - 2.2     LIPID PANEL    Collection Time     11/27/12  1:35 AM       Result Value Range    LIPID PROFILE          Cholesterol, total 162  <200 MG/DL    Triglyceride 045  <409 MG/DL    HDL Cholesterol 59      LDL, calculated 74.4  0 - 811 MG/DL    VLDL, calculated 28.6  CHOL/HDL Ratio 2.7  0 - 5.0     CBC WITH AUTOMATED DIFF    Collection Time     11/27/12  1:35 AM       Result Value Range    WBC 4.1  3.6 - 11.0 K/uL    RBC 4.40  3.80 - 5.20 M/uL    HGB 13.0  11.5 - 16.0 g/dL    HCT 16.1  09.6 - 04.5 %    MCV 85.9  80.0 - 99.0 FL    MCH 29.5  26.0 - 34.0 PG    MCHC 34.4  30.0 - 36.5 g/dL    RDW 40.9  81.1 - 91.4 %    PLATELET 240  150 - 400 K/uL    NEUTROPHILS 32  32 - 75 %    LYMPHOCYTES 57 (*) 12 - 49 %    MONOCYTES 7  5 - 13 %    EOSINOPHILS 3  0 - 7 %    BASOPHILS 1  0 - 1 %    ABS. NEUTROPHILS 1.3 (*) 1.8 - 8.0 K/UL    ABS. LYMPHOCYTES 2.3  0.8 - 3.5 K/UL    ABS. MONOCYTES 0.3  0.0 - 1.0 K/UL    ABS. EOSINOPHILS 0.1  0.0 - 0.4 K/UL    ABS. BASOPHILS 0.0  0.0 - 0.1 K/UL   TROPONIN I    Collection Time     11/27/12  1:35 AM       Result Value Range    Troponin-I, Qt. <0.04  <0.05 ng/mL   CK W/ CKMB & INDEX    Collection Time     11/27/12  1:35 AM       Result Value Range    CK 287 (*) 26 - 192 U/L    CK - MB 2.2  0.5 - 3.6 NG/ML    CK-MB Index 0.8  0 - 2.5     TSH, 3RD GENERATION    Collection Time     11/27/12  1:35 AM       Result Value Range    TSH 2.61  0.36 - 3.74 uIU/mL   MAGNESIUM    Collection Time     11/27/12  1:35 AM       Result Value Range    Magnesium 1.9  1.6 - 2.4 mg/dL   PHOSPHORUS    Collection Time     11/27/12  1:35 AM       Result Value Range    Phosphorus 3.9  2.5 - 4.9 MG/DL   URINALYSIS W/MICROSCOPIC    Collection Time     11/27/12  6:37 AM       Result Value Range    Color YELLOW/STRAW      Appearance CLEAR  CLEAR      Specific gravity 1.014  1.003 - 1.030      pH (UA) 6.0  5.0 - 8.0       Protein NEGATIVE   NEG mg/dL    Glucose NEGATIVE   NEG mg/dL    Ketone NEGATIVE   NEG mg/dL    Bilirubin NEGATIVE   NEG      Blood NEGATIVE   NEG      Urobilinogen 0.2  0.2 - 1.0 EU/dL    Nitrites NEGATIVE   NEG      Leukocyte Esterase TRACE (*) NEG      WBC 5-10  0 - 4 /hpf    RBC 0-5  0 - 5 /  hpf    Epithelial cells FEW  FEW /lpf    Bacteria NEGATIVE   NEG /hpf    Hyaline Cast 0-2  0 - 2       Problem List:  Problem List as of 11/27/2012 Date Reviewed: 11/26/2012        ICD-9-CM Class Noted - Resolved    *Chest pain 786.50  11/26/2012 - Present              Medications reviewed  Current Facility-Administered Medications   Medication Dose Route Frequency   ??? [COMPLETED] sodium chloride (NS) 0.9 % flush       ??? [START ON 11/28/2012] influenza vaccine 2013-14(64yr+)(PF) (FLUZONE,FLUARIX) injection 0.5 mL  0.5 mL IntraMUSCular PRIOR TO DISCHARGE   ??? [COMPLETED] aspirin chewable tablet 162 mg  162 mg Oral NOW   ??? acetaminophen (TYLENOL) tablet 650 mg  650 mg Oral Q4H PRN   ??? HYDROcodone-acetaminophen (NORCO) 5-325 mg per tablet 1 Tab  1 Tab Oral Q4H PRN   ??? morphine injection 2 mg  2 mg IntraVENous Q4H PRN   ??? acetaminophen (TYLENOL) tablet 650 mg  650 mg Oral Q4H PRN   ??? ondansetron (ZOFRAN) injection 4 mg  4 mg IntraVENous Q4H PRN   ??? bisacodyl (DULCOLAX) suppository 10 mg  10 mg Rectal DAILY PRN   ??? bisacodyl (DULCOLAX) tablet 5 mg  5 mg Oral DAILY PRN   ??? docusate sodium (COLACE) capsule 100 mg  100 mg Oral BID   ??? enoxaparin (LOVENOX) injection 40 mg  40 mg SubCUTAneous Q24H   ??? pantoprazole (PROTONIX) tablet 40 mg  40 mg Oral DAILY   ??? albuterol-ipratropium (DUO-NEB) 2.5 MG-0.5 MG/3 ML  3 mL Nebulization Q4H PRN   ??? aspirin chewable tablet 81 mg  81 mg Oral DAILY   ??? metoprolol (LOPRESSOR) tablet 50 mg  50 mg Oral Q12H   ??? nicotine (NICODERM CQ) 21 mg/24 hr patch 1 Patch  1 Patch TransDERmal Q24H   ??? [COMPLETED] sodium chloride (NS) 0.9 % flush           Care Plan discussed with: Patient/Family and Nurse    Total  time spent with patient: 30 minutes.    Ladoris Gene, MD

## 2012-11-28 MED ORDER — HYDROCODONE-ACETAMINOPHEN 5 MG-325 MG TAB
5-325 mg | ORAL_TABLET | ORAL | Status: DC | PRN
Start: 2012-11-28 — End: 2021-07-01

## 2012-11-28 MED FILL — PANTOPRAZOLE 40 MG TAB, DELAYED RELEASE: 40 mg | ORAL | Qty: 1

## 2012-11-28 MED FILL — NICOTINE 21 MG/24 HR DAILY PATCH: 21 mg/24 hr | TRANSDERMAL | Qty: 1

## 2012-11-28 MED FILL — PLAVIX 75 MG TABLET: 75 mg | ORAL | Qty: 1

## 2012-11-28 MED FILL — METOPROLOL TARTRATE 50 MG TAB: 50 mg | ORAL | Qty: 1

## 2012-11-28 MED FILL — LOVASTATIN 20 MG TAB: 20 mg | ORAL | Qty: 2

## 2012-11-28 MED FILL — ISOSORBIDE MONONITRATE SR 30 MG 24 HR TAB: 30 mg | ORAL | Qty: 1

## 2012-11-28 MED FILL — LOVENOX 40 MG/0.4 ML SUBCUTANEOUS SYRINGE: 40 mg/0.4 mL | SUBCUTANEOUS | Qty: 0.4

## 2012-11-28 NOTE — Progress Notes (Signed)
Formatting of this note might be different from the original.  CM reviewed chart and noted discharge. CM met with patient that states her granddaughter will provide transportation to home today.  IM letter explained and signed by patient and copy given.  No discharge needs identified at this time.  CM will continue to follow as needed. Royston CowperPaula Brewer, RN/ CRM  Electronically signed by Lynann BolognaBrewer, Paula W at 11/28/2012 11:04 AM EDT

## 2012-11-28 NOTE — Progress Notes (Signed)
Formatting of this note is different from the original.  Images from the original note were not included.      Hospitalist Progress Note         Wendie SimmerEndalkachew Erena, MD  [facility] [phone]  Call physician on-call through the operator 7pm-7am    Daily Progress Note: 11/28/2012    Assessment/Plan:   1. Chest pain, hx of CAD s/p RCA stent in 2003, improved, continue plavix, isosorbide mononitrate, stopped aspirin, HR 55 and complain headache, and hx of asthma will discontinue Beta blocker (metoprolol), troponin <0.04 x2, ekg normal sinus rhythm vent rate 83 bpm non specific st t wave change, chest x ray no acute process, saturating 97-98% on RA, nuclear stress test was normal, cardiologist is involved    2. Hx of GERD, continue home meds nexium  3. HTN BP is stable, continue lisinopril and monitor BP  4. Swelling of feet and ankle, d-dimer mildly elevated, doppler study no evidence of right or left lower extremities DVT  5. Hx of Asthma, feels some shortness but no cough, prn duo neb   6. Hx of OSA, continue CPAP q hs  7. Tobacco abused, on nicotine patch, advised to stop smoking  8. Headache, multifactorial, had in the past, follow up with pcp as an outpatient       VTE prophylaxis lovenox   Disposition home       Subjective:   Feels better today, no chest pain, feels headache     Review of Systems:   Pertinent items are noted in HPI.    Objective:   Physical Exam:     BP 140/64  Pulse 55  Temp(Src) 98.1 F (36.7 C)  Resp 18  Ht 5' (1.524 m)  Wt 87.9 kg (193 lb 12.6 oz)  BMI 37.85 kg/m2  SpO2 99%  Breastfeeding? No   O2 Device: Room air    Temp (24hrs), Avg:98.2 F (36.8 C), Min:98 F (36.7 C), Max:98.4 F (36.9 C)    05/28 0700 - 05/28 1859  In: -   Out: 1250 [Urine:1250]   05/26 1900 - 05/28 0659  In: 710 [P.O.:710]  Out: 1350 [Urine:1350]    General:  Alert, cooperative, no distress, appears stated age.   Lungs:   clear to auscultation bilaterally.   Chest wall:  No tenderness or deformity.   Heart:   Regular rate and rhythm, S1, S2 normal, no murmur, click, rub or gallop.   Abdomen:   Soft, non-tender. Bowel sounds normal. No masses,  No organomegaly.   Extremities: Trace pretibial and pedal edema.   Skin: No rashes or lesions   Neurologic: Conscious and well oriented, motor 5/5, CNII-XII intact.      Data Review:     Recent Days:  Recent Labs      11/27/12   0135  11/26/12   1702   WBC  4.1  3.7   HGB  13.0  13.4   HCT  37.8  38.5   PLT  240  248     Recent Labs      11/27/12   0135  11/26/12   1702   NA  140  140   K  3.6  3.9   CL  110*  110*   CO2  25  23   GLU  88  92   BUN  13  14   CREA  0.96  0.89   CA  8.8  9.4   MG  1.9   --  PHOS  3.9   --    ALB  3.4*  3.9   SGOT  16  26   ALT  20  25     No results found for this basename: PH, PCO2, PO2, HCO3, FIO2,  in the last 72 hours    24 Hour Results:  Recent Results (from the past 24 hour(s))   HEMOGLOBIN A1C    Collection Time     11/27/12 10:00 AM       Result Value Range    Hemoglobin A1c 6.2  4.2 - 6.3 %    Est. average glucose 131     ECHO DOBUTAMINE STRESS TRACING    Collection Time     11/27/12 10:49 AM       Result Value Range    Diagnosis        Value: Normal LV systolic function and wall motion at rest and stress.  Was given       0.5       mg atropine to reach target HR; esmolol afterwards.  Normal dobutamine       stress       echo.        Confirmed by Dorna Bloom, M.D., Remi Deter [phone]), editor Rowland Crews ([id number]) on       11/27/2012 11:41:51 AM    Test indication        Value: Dyspnea      Chest Discomfort    Functional capacity        ECG Interp. Before Exercise normal sinus rhythm, diffuse T wave inversions      ECG Interp. During Exercise non diagnostic      Overall HR response to exercise        Overall BP response to exercise        Max. Systolic BP 167      Max. Diastolic BP 78      Max. Heart rate 160      Duke treadmill score        Duke TM score result        Peak Ex METs 1.0      Protocol name DOBUTAMINE            Known cardiac condition         Attending physician S.WU MD       Problem List:  Problem List as of 11/28/2012 Date Reviewed: 11/26/2012        ICD-9-CM Class Noted - Resolved    *Chest pain 786.50  11/26/2012 - Present         Medications reviewed  Current Facility-Administered Medications   Medication Dose Route Frequency   ? influenza vaccine 2013-14(4yr+)(PF) (FLUZONE,FLUARIX) injection 0.5 mL  0.5 mL IntraMUSCular PRIOR TO DISCHARGE   ? clopidogrel (PLAVIX) tablet 75 mg  75 mg Oral DAILY   ? isosorbide mononitrate ER (IMDUR) tablet 30 mg  30 mg Oral DAILY   ? lisinopril (PRINIVIL, ZESTRIL) tablet 40 mg  40 mg Oral DAILY   ? lovastatin (MEVACOR) tablet 40 mg  40 mg Oral QHS   ? [COMPLETED] DOBUTamine (DOBUTREX) 500 mg/250 mL (2,000 mcg/mL) infusion  10-40 mcg/kg/min IntraVENous RAD ONCE   ? [COMPLETED] saline peripheral flush soln 10 mL  10 mL InterCATHeter RAD ONCE   ? [COMPLETED] atropine injection 1 mg  1 mg IntraVENous RAD ONCE   ? HYDROcodone-acetaminophen (NORCO) 5-325 mg per tablet 1 Tab  1 Tab Oral Q6H PRN   ? acetaminophen (TYLENOL) tablet 650 mg  650 mg Oral Q4H PRN   ? HYDROcodone-acetaminophen (NORCO) 5-325 mg per tablet 1 Tab  1 Tab Oral Q4H PRN   ? morphine injection 2 mg  2 mg IntraVENous Q4H PRN   ? acetaminophen (TYLENOL) tablet 650 mg  650 mg Oral Q4H PRN   ? ondansetron (ZOFRAN) injection 4 mg  4 mg IntraVENous Q4H PRN   ? bisacodyl (DULCOLAX) suppository 10 mg  10 mg Rectal DAILY PRN   ? bisacodyl (DULCOLAX) tablet 5 mg  5 mg Oral DAILY PRN   ? docusate sodium (COLACE) capsule 100 mg  100 mg Oral BID   ? enoxaparin (LOVENOX) injection 40 mg  40 mg SubCUTAneous Q24H   ? pantoprazole (PROTONIX) tablet 40 mg  40 mg Oral DAILY   ? albuterol-ipratropium (DUO-NEB) 2.5 MG-0.5 MG/3 ML  3 mL Nebulization Q4H PRN   ? aspirin chewable tablet 81 mg  81 mg Oral DAILY   ? metoprolol (LOPRESSOR) tablet 50 mg  50 mg Oral Q12H   ? nicotine (NICODERM CQ) 21 mg/24 hr patch 1 Patch  1 Patch TransDERmal Q24H     Care Plan discussed with:  Patient/Family and Nurse    Total time spent with patient: 30 minutes.    Ladoris Gene, MD  Electronically signed by Ladoris Gene, MD at 11/28/2012 10:17 AM EDT

## 2012-11-28 NOTE — Progress Notes (Signed)
Formatting of this note might be different from the original.  I have reviewed discharge instructions with the patient.  The patient verbalized understanding.  Electronically signed by Caleb PoppLawhon, Angela L at 11/28/2012 11:41 AM EDT

## 2012-11-28 NOTE — Discharge Summary (Signed)
Formatting of this note is different from the original.  Images from the original note were not included.     Discharge Summary     PATIENT ID: Tanya [name]  MRN: [id number]   DATE OF BIRTH: 10-26-1940    DATE OF ADMISSION: 11/26/2012  4:24 PM    DATE OF DISCHARGE: 11/28/2012   PRIMARY CARE PROVIDER: Chauncey Fischeronna Ambrose, NP     DISCHARGING PHYSICIAN: Ladoris GeneENDALKACHEW G ERENA, MD    To contact this individual call [phone] and ask the operator to page.  If unavailable ask to be transferred the Adult Hospitalist Department.    CONSULTATIONS: IP CONSULT TO HOSPITALIST  IP CONSULT TO CARDIOLOGY    PROCEDURES/SURGERIES: * No surgery found *    ADMITTING DIAGNOSES & HOSPITAL COURSE:   This is a 72 year old woman with a past medical history significant for coronary artery disease, status post stent   placement, obstructive sleep apnea, hypertension, COPD, who was in her usual state of health until about a week ago when the patient started experiencing chest pain. Initially, the patient thought that the chest pain is due to acid reflux, but this episode of chest pain lasted longer than usual and the chest pain that she is experiencing is similar to the chest   pain she had when she was diagnosed with coronary artery disease and stent procedure was performed. [place] the [place] part of [place]. She came to the emergency room because of   worsening chest pain. When the patient arrived at the emergency room, she was evaluated by the emergency room physician. EKG was obtained. EKG showed some changes according to the emergency room physician, but there was no old EKG to compare the changes with. The patient was subsequently referred to the hospitalist service for evaluation for admission. No history of fever, rigors or chills.    DISCHARGE DIAGNOSES / PLAN:      1. Chest pain, hx of CAD s/p RCA stent in 2003, nuclear stress test normal, troponin < 0.04 x 2, seen by cardiologist, continue plavix,  isosorbide mononitrate,  HR 55 and complain headache, and hx of asthma, Beta blocker (metoprolol) is discontinued, ekg normal sinus rhythm vent rate 83 bpm non specific st t wave change, chest x ray no acute process, saturating 97-98% on RA, cardiologist is involved   2. Hx of GERD, continue home meds nexium  3. HTN BP is stable, continue lisinopril   4. Swelling of feet and ankle, d-dimer mildly elevated, doppler study no evidence of right or left lower extremities DVT  5. Hx of Asthma,stable, prn duo neb   6. Hx of OSA, continue CPAP q hs  7. Tobacco abused, advised to stop smoking  8. Headache, multifactorial, had in the past, follow up with pcp as an outpatient   VTE prophylaxis lovenox   Disposition home         PENDING TEST RESULTS:   At the time of discharge the following test results are still pending: none     FOLLOW UP APPOINTMENTS:      ADDITIONAL CARE RECOMMENDATIONS:     DIET: Regular Diet    ACTIVITY: activity as tolerated    WOUND CARE: none     EQUIPMENT needed: none     DISCHARGE MEDICATIONS:  Current Discharge Medication List     START taking these medications    Details   HYDROcodone-acetaminophen (NORCO) 5-325 mg per tablet Take 1 Tab by mouth every four (4) hours as  needed.  Qty: 10 Tab, Refills: 0       CONTINUE these medications which have NOT CHANGED    Details   isosorbide mononitrate ER (IMDUR) 30 mg tablet Take 30 mg by mouth daily.     LEVOTHYROXINE SODIUM (LEVOTHROID PO) Take 50 mg by mouth.     diclofenac EC (VOLTAREN) 25 mg EC tablet Take 25 mg by mouth daily.     clopidogrel (PLAVIX) 75 mg tablet Take  by mouth daily.     allopurinol (ZYLOPRIM) 100 mg tablet Take 100 mg by mouth daily.     lovastatin (MEVACOR) 40 mg tablet Take 40 mg by mouth nightly.     lisinopril (PRINIVIL, ZESTRIL) 40 mg tablet Take 40 mg by mouth daily.     colchicine (COLCRYS) 0.6 mg tablet Take 0.6 mg by mouth daily.     Arformoterol (BROVANA) 15 mcg/2 mL nebu neb solution 15 mcg by Nebulization route two (2)  times daily as needed.     fluticasone (VERAMYST) 27.5 mcg/actuation nasal spray 2 Sprays by Nasal route two (2) times daily as needed for Rhinitis.       STOP taking these medications      ISOSORBIDE DINITRATE (ISORDIL TITRADOSE PO) Comments:   Reason for Stopping:          NOTIFY YOUR PHYSICIAN FOR ANY OF THE FOLLOWING:   Fever over 101 degrees for 24 hours.   Chest pain, shortness of breath, fever, chills, nausea, vomiting, diarrhea, change in mentation, falling, weakness, bleeding. Severe pain or pain not relieved by medications.  Or, any other signs or symptoms that you may have questions about.    DISPOSITION:   x Home With:   OT  PT  HH  RN      Long term SNF/Inpatient Rehab    Independent/assisted living    Hospice    Other:     PATIENT CONDITION AT DISCHARGE:     Functional status    Poor     Deconditioned    x Independent      Cognition    x Lucid     Forgetful     Dementia      Catheters/lines (plus indication)    Foley     PICC     PEG    x None      Code status    x Full code     DNR      PHYSICAL EXAMINATION AT DISCHARGE:   Refer to Progress Note    CHRONIC MEDICAL DIAGNOSES:  Problem List as of 11/28/2012 Date Reviewed: 11/28/2012        ICD-9-CM Class Noted - Resolved    *RESOLVED: Chest pain 786.50  11/26/2012 - 11/28/2012         Greater than 30 minutes were spent with the patient on counseling and coordination of care    Signed:   Ladoris Gene, MD  11/28/2012  10:20 AM      Electronically signed by Ladoris Gene, MD at 11/28/2012  9:42 PM EDT

## 2012-11-28 NOTE — Progress Notes (Signed)
Formatting of this note might be different from the original.  Interdisciplinary team rounds were held 11/28/2012 with the following team members:Care Management, Nursing, Physical Therapy and Clinical Coordinator and the Heart Failure Nurse Navigator.    Plan of care discussed. See clinical pathway and/or care plan for interventions and desired outcomes.  Electronically signed by Maryann ConnersSnyder, Hallie G., PT at 11/28/2012  9:30 AM EDT

## 2012-11-28 NOTE — Progress Notes (Addendum)
Hospitalist Progress Note         Tanya Simmer, MD  [facility] [phone]  Call physician on-call through the operator 7pm-7am    Daily Progress Note: 11/28/2012    Assessment/Plan:   1. Chest pain, hx of CAD s/p RCA stent in 2003, improved, continue plavix, isosorbide mononitrate, stopped aspirin, HR 55 and complain headache, and hx of asthma will discontinue Beta blocker (metoprolol), troponin <0.04 x2, ekg normal sinus rhythm vent rate 83 bpm non specific st t wave change, chest x ray no acute process, saturating 97-98% on RA, nuclear stress test was normal, cardiologist is involved    2. Hx of GERD, continue home meds nexium  3. HTN BP is stable, continue lisinopril and monitor BP  4. Swelling of feet and ankle, d-dimer mildly elevated, doppler study no evidence of right or left lower extremities DVT  5. Hx of Asthma, feels some shortness but no cough, prn duo neb   6. Hx of OSA, continue CPAP q hs  7. Tobacco abused, on nicotine patch, advised to stop smoking  8. Headache, multifactorial, had in the past, follow up with pcp as an outpatient       VTE prophylaxis lovenox   Disposition home          Subjective:   Feels better today, no chest pain, feels headache     Review of Systems:   Pertinent items are noted in HPI.    Objective:   Physical Exam:     BP 140/64   Pulse 55   Temp(Src) 98.1 ??F (36.7 ??C)   Resp 18   Ht 5' (1.524 m)   Wt 87.9 kg (193 lb 12.6 oz)   BMI 37.85 kg/m2   SpO2 99%   Breastfeeding? No   O2 Device: Room air    Temp (24hrs), Avg:98.2 ??F (36.8 ??C), Min:98 ??F (36.7 ??C), Max:98.4 ??F (36.9 ??C)    05/28 0700 - 05/28 1859  In: -   Out: 1250 [Urine:1250]   05/26 1900 - 05/28 0659  In: 710 [P.O.:710]  Out: 1350 [Urine:1350]    General:  Alert, cooperative, no distress, appears stated age.   Lungs:   clear to auscultation bilaterally.   Chest wall:  No tenderness or deformity.   Heart:  Regular rate and rhythm, S1, S2 normal, no murmur, click, rub or gallop.    Abdomen:   Soft, non-tender. Bowel sounds normal. No masses,  No organomegaly.   Extremities: Trace pretibial and pedal edema.   Skin: No rashes or lesions   Neurologic: Conscious and well oriented, motor 5/5, CNII-XII intact.      Data Review:       Recent Days:  Recent Labs      11/27/12   0135  11/26/12   1702   WBC  4.1  3.7   HGB  13.0  13.4   HCT  37.8  38.5   PLT  240  248     Recent Labs      11/27/12   0135  11/26/12   1702   NA  140  140   K  3.6  3.9   CL  110*  110*   CO2  25  23   GLU  88  92   BUN  13  14   CREA  0.96  0.89   CA  8.8  9.4   MG  1.9   --    PHOS  3.9   --  ALB  3.4*  3.9   SGOT  16  26   ALT  20  25     No results found for this basename: PH, PCO2, PO2, HCO3, FIO2,  in the last 72 hours    24 Hour Results:  Recent Results (from the past 24 hour(s))   HEMOGLOBIN A1C    Collection Time     11/27/12 10:00 AM       Result Value Range    Hemoglobin A1c 6.2  4.2 - 6.3 %    Est. average glucose 131     ECHO DOBUTAMINE STRESS TRACING    Collection Time     11/27/12 10:49 AM       Result Value Range    Diagnosis        Value: Normal LV systolic function and wall motion at rest and stress.  Was given       0.5       mg atropine to reach target HR; esmolol afterwards.  Normal dobutamine       stress       echo.            Confirmed by Dorna Bloom, M.D., Remi Deter [phone]), editor [place] ([id number]) on       11/27/2012 11:41:51 AM    Test indication        Value: Dyspnea      Chest Discomfort    Functional capacity        ECG Interp. Before Exercise normal sinus rhythm, diffuse T wave inversions      ECG Interp. During Exercise non diagnostic      Overall HR response to exercise        Overall BP response to exercise        Max. Systolic BP 167      Max. Diastolic BP 78      Max. Heart rate 160      Duke treadmill score        Duke TM score result        Peak Ex METs 1.0      Protocol name DOBUTAMINE            Known cardiac condition        Attending physician S.WU MD         Problem List:  Problem List as  of 11/28/2012 Date Reviewed: 11/26/2012        ICD-9-CM Class Noted - Resolved    *Chest pain 786.50  11/26/2012 - Present              Medications reviewed  Current Facility-Administered Medications   Medication Dose Route Frequency   ??? influenza vaccine 2013-14(59yr+)(PF) (FLUZONE,FLUARIX) injection 0.5 mL  0.5 mL IntraMUSCular PRIOR TO DISCHARGE   ??? clopidogrel (PLAVIX) tablet 75 mg  75 mg Oral DAILY   ??? isosorbide mononitrate ER (IMDUR) tablet 30 mg  30 mg Oral DAILY   ??? lisinopril (PRINIVIL, ZESTRIL) tablet 40 mg  40 mg Oral DAILY   ??? lovastatin (MEVACOR) tablet 40 mg  40 mg Oral QHS   ??? [COMPLETED] DOBUTamine (DOBUTREX) 500 mg/250 mL (2,000 mcg/mL) infusion  10-40 mcg/kg/min IntraVENous RAD ONCE   ??? [COMPLETED] saline peripheral flush soln 10 mL  10 mL InterCATHeter RAD ONCE   ??? [COMPLETED] atropine injection 1 mg  1 mg IntraVENous RAD ONCE   ??? HYDROcodone-acetaminophen (NORCO) 5-325 mg per tablet 1 Tab  1 Tab Oral Q6H PRN   ??? acetaminophen (TYLENOL) tablet  650 mg  650 mg Oral Q4H PRN   ??? HYDROcodone-acetaminophen (NORCO) 5-325 mg per tablet 1 Tab  1 Tab Oral Q4H PRN   ??? morphine injection 2 mg  2 mg IntraVENous Q4H PRN   ??? acetaminophen (TYLENOL) tablet 650 mg  650 mg Oral Q4H PRN   ??? ondansetron (ZOFRAN) injection 4 mg  4 mg IntraVENous Q4H PRN   ??? bisacodyl (DULCOLAX) suppository 10 mg  10 mg Rectal DAILY PRN   ??? bisacodyl (DULCOLAX) tablet 5 mg  5 mg Oral DAILY PRN   ??? docusate sodium (COLACE) capsule 100 mg  100 mg Oral BID   ??? enoxaparin (LOVENOX) injection 40 mg  40 mg SubCUTAneous Q24H   ??? pantoprazole (PROTONIX) tablet 40 mg  40 mg Oral DAILY   ??? albuterol-ipratropium (DUO-NEB) 2.5 MG-0.5 MG/3 ML  3 mL Nebulization Q4H PRN   ??? aspirin chewable tablet 81 mg  81 mg Oral DAILY   ??? metoprolol (LOPRESSOR) tablet 50 mg  50 mg Oral Q12H   ??? nicotine (NICODERM CQ) 21 mg/24 hr patch 1 Patch  1 Patch TransDERmal Q24H       Care Plan discussed with: Patient/Family and Nurse    Total time spent with patient: 30  minutes.    Ladoris Gene, MD

## 2012-11-28 NOTE — Progress Notes (Signed)
CM reviewed chart and noted discharge. CM met with patient that states her granddaughter will provide transportation to home today.  IM letter explained and signed by patient and copy given.  No discharge needs identified at this time.  CM will continue to follow as needed. Royston Cowper, RN/ CRM

## 2012-11-28 NOTE — Progress Notes (Signed)
I have reviewed discharge instructions with the patient.  The patient verbalized understanding.

## 2012-11-28 NOTE — Discharge Summary (Signed)
Discharge Summary       PATIENT ID: Tanya [name]  MRN: [id number]   DATE OF BIRTH: 07-03-1941    DATE OF ADMISSION: 11/26/2012  4:24 PM    DATE OF DISCHARGE: 11/28/2012   PRIMARY CARE PROVIDER: Chauncey Fischer, NP       DISCHARGING PHYSICIAN: Ladoris Gene, MD    To contact this individual call [phone] and ask the operator to page.  If unavailable ask to be transferred the Adult Hospitalist Department.    CONSULTATIONS: IP CONSULT TO HOSPITALIST  IP CONSULT TO CARDIOLOGY    PROCEDURES/SURGERIES: * No surgery found *    ADMITTING DIAGNOSES & HOSPITAL COURSE:   This is a 72 year old woman with a past medical history significant for coronary artery disease, status post stent   placement, obstructive sleep apnea, hypertension, COPD, who was in her usual state of health until about a week ago when the patient started experiencing chest pain. Initially, the patient thought that the chest pain is due to acid reflux, but this episode of chest pain lasted longer than usual and the chest pain that she is experiencing is similar to the chest   pain she had when she was diagnosed with coronary artery disease and stent procedure was performed. The patient is visiting the [place] area from another part of [place]. She came to the emergency room because of   worsening chest pain. When the patient arrived at the emergency room, she was evaluated by the emergency room physician. EKG was obtained. EKG showed some changes according to the emergency room physician, but there was no old EKG to compare the changes with. The patient was subsequently referred to the hospitalist service for evaluation for admission. No history of fever, rigors or chills.    DISCHARGE DIAGNOSES / PLAN:      1. Chest pain, hx of CAD s/p RCA stent in 2003, nuclear stress test normal, troponin < 0.04 x 2, seen by cardiologist, continue plavix, isosorbide mononitrate,  HR 55 and complain headache, and hx of asthma, Beta blocker (metoprolol) is  discontinued, ekg normal sinus rhythm vent rate 83 bpm non specific st t wave change, chest x ray no acute process, saturating 97-98% on RA, cardiologist is involved   2. Hx of GERD, continue home meds nexium  3. HTN BP is stable, continue lisinopril   4. Swelling of feet and ankle, d-dimer mildly elevated, doppler study no evidence of right or left lower extremities DVT  5. Hx of Asthma,stable, prn duo neb   6. Hx of OSA, continue CPAP q hs  7. Tobacco abused, advised to stop smoking  8. Headache, multifactorial, had in the past, follow up with pcp as an outpatient   VTE prophylaxis lovenox   Disposition home            PENDING TEST RESULTS:   At the time of discharge the following test results are still pending: none     FOLLOW UP APPOINTMENTS:        ADDITIONAL CARE RECOMMENDATIONS:     DIET: Regular Diet    ACTIVITY: activity as tolerated    WOUND CARE: none     EQUIPMENT needed: none       DISCHARGE MEDICATIONS:  Current Discharge Medication List      START taking these medications    Details   HYDROcodone-acetaminophen (NORCO) 5-325 mg per tablet Take 1 Tab by mouth every four (4) hours as needed.  Qty: 10 Tab, Refills: 0  CONTINUE these medications which have NOT CHANGED    Details   isosorbide mononitrate ER (IMDUR) 30 mg tablet Take 30 mg by mouth daily.      LEVOTHYROXINE SODIUM (LEVOTHROID PO) Take 50 mg by mouth.      diclofenac EC (VOLTAREN) 25 mg EC tablet Take 25 mg by mouth daily.      clopidogrel (PLAVIX) 75 mg tablet Take  by mouth daily.      allopurinol (ZYLOPRIM) 100 mg tablet Take 100 mg by mouth daily.      lovastatin (MEVACOR) 40 mg tablet Take 40 mg by mouth nightly.      lisinopril (PRINIVIL, ZESTRIL) 40 mg tablet Take 40 mg by mouth daily.      colchicine (COLCRYS) 0.6 mg tablet Take 0.6 mg by mouth daily.      Arformoterol (BROVANA) 15 mcg/2 mL nebu neb solution 15 mcg by Nebulization route two (2) times daily as needed.      fluticasone (VERAMYST) 27.5 mcg/actuation nasal spray 2  Sprays by Nasal route two (2) times daily as needed for Rhinitis.         STOP taking these medications       ISOSORBIDE DINITRATE (ISORDIL TITRADOSE PO) Comments:   Reason for Stopping:                 NOTIFY YOUR PHYSICIAN FOR ANY OF THE FOLLOWING:   Fever over 101 degrees for 24 hours.   Chest pain, shortness of breath, fever, chills, nausea, vomiting, diarrhea, change in mentation, falling, weakness, bleeding. Severe pain or pain not relieved by medications.  Or, any other signs or symptoms that you may have questions about.    DISPOSITION:   x Home With:   OT  PT  HH  RN       Long term SNF/Inpatient Rehab    Independent/assisted living    Hospice    Other:       PATIENT CONDITION AT DISCHARGE:     Functional status    Poor     Deconditioned    x Independent      Cognition    x Lucid     Forgetful     Dementia      Catheters/lines (plus indication)    Foley     PICC     PEG    x None      Code status    x Full code     DNR      PHYSICAL EXAMINATION AT DISCHARGE:   Refer to Progress Note      CHRONIC MEDICAL DIAGNOSES:  Problem List as of 11/28/2012 Date Reviewed: 11/28/2012        ICD-9-CM Class Noted - Resolved    *RESOLVED: Chest pain 786.50  11/26/2012 - 11/28/2012              Greater than 30 minutes were spent with the patient on counseling and coordination of care    Signed:   Ladoris Gene, MD  11/28/2012  10:20 AM

## 2012-11-28 NOTE — Progress Notes (Signed)
Interdisciplinary team rounds were held 11/28/2012 with the following team members:Care Management, Nursing, Physical Therapy and Clinical Coordinator and the Heart Failure Nurse Navigator.    Plan of care discussed. See clinical pathway and/or care plan for interventions and desired outcomes.

## 2014-08-11 DIAGNOSIS — E039 Hypothyroidism, unspecified: Secondary | ICD-10-CM | POA: Diagnosis not present

## 2014-08-11 DIAGNOSIS — R1013 Epigastric pain: Secondary | ICD-10-CM | POA: Diagnosis not present

## 2014-08-11 DIAGNOSIS — I25700 Atherosclerosis of coronary artery bypass graft(s), unspecified, with unstable angina pectoris: Secondary | ICD-10-CM | POA: Diagnosis not present

## 2014-08-11 DIAGNOSIS — I10 Essential (primary) hypertension: Secondary | ICD-10-CM | POA: Diagnosis not present

## 2014-08-14 ENCOUNTER — Ambulatory Visit
Admission: RE | Admit: 2014-08-14 | Discharge: 2014-08-14 | Disposition: A | Discharge status: Home / Self Care | Payer: Commercial Managed Care - HMO | Source: Ambulatory Visit | Attending: Family Medicine | Admitting: Family Medicine

## 2014-08-14 ENCOUNTER — Other Ambulatory Visit: Payer: Self-pay | Admitting: Family Medicine

## 2014-08-14 DIAGNOSIS — R0781 Pleurodynia: Secondary | ICD-10-CM

## 2014-08-14 DIAGNOSIS — J9811 Atelectasis: Secondary | ICD-10-CM | POA: Diagnosis not present

## 2014-08-14 DIAGNOSIS — S4991XA Unspecified injury of right shoulder and upper arm, initial encounter: Secondary | ICD-10-CM | POA: Diagnosis not present

## 2014-09-09 DIAGNOSIS — M19011 Primary osteoarthritis, right shoulder: Secondary | ICD-10-CM | POA: Diagnosis not present

## 2014-09-09 DIAGNOSIS — Z96651 Presence of right artificial knee joint: Secondary | ICD-10-CM | POA: Diagnosis not present

## 2014-09-09 DIAGNOSIS — M25561 Pain in right knee: Secondary | ICD-10-CM | POA: Diagnosis not present

## 2014-09-09 DIAGNOSIS — M545 Low back pain: Secondary | ICD-10-CM | POA: Diagnosis not present

## 2014-09-23 DIAGNOSIS — S46011D Strain of muscle(s) and tendon(s) of the rotator cuff of right shoulder, subsequent encounter: Secondary | ICD-10-CM | POA: Diagnosis not present

## 2014-09-23 DIAGNOSIS — M25511 Pain in right shoulder: Secondary | ICD-10-CM | POA: Diagnosis not present

## 2014-09-23 DIAGNOSIS — M545 Low back pain: Secondary | ICD-10-CM | POA: Diagnosis not present

## 2014-09-24 DIAGNOSIS — I10 Essential (primary) hypertension: Secondary | ICD-10-CM | POA: Diagnosis not present

## 2014-09-24 DIAGNOSIS — M130 Polyarthritis, unspecified: Secondary | ICD-10-CM | POA: Diagnosis not present

## 2014-09-24 DIAGNOSIS — E039 Hypothyroidism, unspecified: Secondary | ICD-10-CM | POA: Diagnosis not present

## 2014-10-01 DIAGNOSIS — M25511 Pain in right shoulder: Secondary | ICD-10-CM | POA: Diagnosis not present

## 2014-10-01 DIAGNOSIS — M545 Low back pain: Secondary | ICD-10-CM | POA: Diagnosis not present

## 2014-10-01 DIAGNOSIS — S46011D Strain of muscle(s) and tendon(s) of the rotator cuff of right shoulder, subsequent encounter: Secondary | ICD-10-CM | POA: Diagnosis not present

## 2014-10-07 DIAGNOSIS — M545 Low back pain: Secondary | ICD-10-CM | POA: Diagnosis not present

## 2014-10-07 DIAGNOSIS — M25511 Pain in right shoulder: Secondary | ICD-10-CM | POA: Diagnosis not present

## 2014-10-07 DIAGNOSIS — S46011D Strain of muscle(s) and tendon(s) of the rotator cuff of right shoulder, subsequent encounter: Secondary | ICD-10-CM | POA: Diagnosis not present

## 2014-10-09 DIAGNOSIS — S46011D Strain of muscle(s) and tendon(s) of the rotator cuff of right shoulder, subsequent encounter: Secondary | ICD-10-CM | POA: Diagnosis not present

## 2014-10-09 DIAGNOSIS — M545 Low back pain: Secondary | ICD-10-CM | POA: Diagnosis not present

## 2014-10-09 DIAGNOSIS — M25511 Pain in right shoulder: Secondary | ICD-10-CM | POA: Diagnosis not present

## 2014-10-14 DIAGNOSIS — M25511 Pain in right shoulder: Secondary | ICD-10-CM | POA: Diagnosis not present

## 2014-10-14 DIAGNOSIS — S46011D Strain of muscle(s) and tendon(s) of the rotator cuff of right shoulder, subsequent encounter: Secondary | ICD-10-CM | POA: Diagnosis not present

## 2014-10-14 DIAGNOSIS — M545 Low back pain: Secondary | ICD-10-CM | POA: Diagnosis not present

## 2014-10-16 DIAGNOSIS — M25511 Pain in right shoulder: Secondary | ICD-10-CM | POA: Diagnosis not present

## 2014-10-16 DIAGNOSIS — S46011D Strain of muscle(s) and tendon(s) of the rotator cuff of right shoulder, subsequent encounter: Secondary | ICD-10-CM | POA: Diagnosis not present

## 2014-10-16 DIAGNOSIS — M545 Low back pain: Secondary | ICD-10-CM | POA: Diagnosis not present

## 2014-10-21 DIAGNOSIS — S46011D Strain of muscle(s) and tendon(s) of the rotator cuff of right shoulder, subsequent encounter: Secondary | ICD-10-CM | POA: Diagnosis not present

## 2014-10-21 DIAGNOSIS — M25511 Pain in right shoulder: Secondary | ICD-10-CM | POA: Diagnosis not present

## 2014-10-21 DIAGNOSIS — M545 Low back pain: Secondary | ICD-10-CM | POA: Diagnosis not present

## 2014-10-23 DIAGNOSIS — M25511 Pain in right shoulder: Secondary | ICD-10-CM | POA: Diagnosis not present

## 2014-10-23 DIAGNOSIS — M545 Low back pain: Secondary | ICD-10-CM | POA: Diagnosis not present

## 2014-10-23 DIAGNOSIS — S46011D Strain of muscle(s) and tendon(s) of the rotator cuff of right shoulder, subsequent encounter: Secondary | ICD-10-CM | POA: Diagnosis not present

## 2014-10-28 DIAGNOSIS — S46011D Strain of muscle(s) and tendon(s) of the rotator cuff of right shoulder, subsequent encounter: Secondary | ICD-10-CM | POA: Diagnosis not present

## 2014-10-28 DIAGNOSIS — M25511 Pain in right shoulder: Secondary | ICD-10-CM | POA: Diagnosis not present

## 2014-10-28 DIAGNOSIS — M545 Low back pain: Secondary | ICD-10-CM | POA: Diagnosis not present

## 2014-10-30 DIAGNOSIS — M25511 Pain in right shoulder: Secondary | ICD-10-CM | POA: Diagnosis not present

## 2014-10-30 DIAGNOSIS — S46011D Strain of muscle(s) and tendon(s) of the rotator cuff of right shoulder, subsequent encounter: Secondary | ICD-10-CM | POA: Diagnosis not present

## 2014-10-30 DIAGNOSIS — M545 Low back pain: Secondary | ICD-10-CM | POA: Diagnosis not present

## 2014-12-24 DIAGNOSIS — M545 Low back pain: Secondary | ICD-10-CM | POA: Diagnosis not present

## 2014-12-24 DIAGNOSIS — M25511 Pain in right shoulder: Secondary | ICD-10-CM | POA: Diagnosis not present

## 2014-12-24 DIAGNOSIS — M25562 Pain in left knee: Secondary | ICD-10-CM | POA: Diagnosis not present

## 2014-12-24 DIAGNOSIS — E039 Hypothyroidism, unspecified: Secondary | ICD-10-CM | POA: Diagnosis not present

## 2015-01-01 DIAGNOSIS — M25511 Pain in right shoulder: Secondary | ICD-10-CM | POA: Diagnosis not present

## 2015-01-01 DIAGNOSIS — S46011D Strain of muscle(s) and tendon(s) of the rotator cuff of right shoulder, subsequent encounter: Secondary | ICD-10-CM | POA: Diagnosis not present

## 2015-01-01 DIAGNOSIS — M545 Low back pain: Secondary | ICD-10-CM | POA: Diagnosis not present

## 2015-01-16 DIAGNOSIS — M545 Low back pain: Secondary | ICD-10-CM | POA: Diagnosis not present

## 2015-01-16 DIAGNOSIS — M25511 Pain in right shoulder: Secondary | ICD-10-CM | POA: Diagnosis not present

## 2015-01-16 DIAGNOSIS — S46011D Strain of muscle(s) and tendon(s) of the rotator cuff of right shoulder, subsequent encounter: Secondary | ICD-10-CM | POA: Diagnosis not present

## 2015-01-29 DIAGNOSIS — S46011D Strain of muscle(s) and tendon(s) of the rotator cuff of right shoulder, subsequent encounter: Secondary | ICD-10-CM | POA: Diagnosis not present

## 2015-01-29 DIAGNOSIS — M545 Low back pain: Secondary | ICD-10-CM | POA: Diagnosis not present

## 2015-01-29 DIAGNOSIS — M25511 Pain in right shoulder: Secondary | ICD-10-CM | POA: Diagnosis not present

## 2015-01-30 DIAGNOSIS — I10 Essential (primary) hypertension: Secondary | ICD-10-CM | POA: Diagnosis not present

## 2015-01-30 DIAGNOSIS — R7309 Other abnormal glucose: Secondary | ICD-10-CM | POA: Diagnosis not present

## 2015-01-30 DIAGNOSIS — I110 Hypertensive heart disease with heart failure: Secondary | ICD-10-CM | POA: Diagnosis not present

## 2015-01-30 DIAGNOSIS — I129 Hypertensive chronic kidney disease with stage 1 through stage 4 chronic kidney disease, or unspecified chronic kidney disease: Secondary | ICD-10-CM | POA: Diagnosis not present

## 2015-01-30 DIAGNOSIS — M1990 Unspecified osteoarthritis, unspecified site: Secondary | ICD-10-CM | POA: Diagnosis not present

## 2015-01-30 DIAGNOSIS — Z720 Tobacco use: Secondary | ICD-10-CM | POA: Diagnosis not present

## 2015-01-30 DIAGNOSIS — E785 Hyperlipidemia, unspecified: Secondary | ICD-10-CM | POA: Diagnosis not present

## 2015-02-06 DIAGNOSIS — I129 Hypertensive chronic kidney disease with stage 1 through stage 4 chronic kidney disease, or unspecified chronic kidney disease: Secondary | ICD-10-CM | POA: Diagnosis not present

## 2015-02-06 DIAGNOSIS — I10 Essential (primary) hypertension: Secondary | ICD-10-CM | POA: Diagnosis not present

## 2015-02-06 DIAGNOSIS — M130 Polyarthritis, unspecified: Secondary | ICD-10-CM | POA: Diagnosis not present

## 2015-02-23 ENCOUNTER — Inpatient Hospital Stay (HOSPITAL_COMMUNITY): Payer: Commercial Managed Care - HMO

## 2015-02-23 ENCOUNTER — Emergency Department (HOSPITAL_COMMUNITY): Payer: Commercial Managed Care - HMO

## 2015-02-23 ENCOUNTER — Observation Stay (HOSPITAL_COMMUNITY)
Admission: EM | Admit: 2015-02-23 | Discharge: 2015-02-25 | Disposition: A | Discharge status: Home / Self Care | Payer: Commercial Managed Care - HMO | Attending: Internal Medicine | Admitting: Internal Medicine

## 2015-02-23 ENCOUNTER — Encounter (HOSPITAL_COMMUNITY): Payer: Self-pay | Admitting: *Deleted

## 2015-02-23 DIAGNOSIS — R0602 Shortness of breath: Secondary | ICD-10-CM | POA: Diagnosis not present

## 2015-02-23 DIAGNOSIS — R197 Diarrhea, unspecified: Secondary | ICD-10-CM | POA: Diagnosis present

## 2015-02-23 DIAGNOSIS — E669 Obesity, unspecified: Secondary | ICD-10-CM | POA: Diagnosis not present

## 2015-02-23 DIAGNOSIS — F17210 Nicotine dependence, cigarettes, uncomplicated: Secondary | ICD-10-CM | POA: Insufficient documentation

## 2015-02-23 DIAGNOSIS — E876 Hypokalemia: Secondary | ICD-10-CM | POA: Diagnosis not present

## 2015-02-23 DIAGNOSIS — R0789 Other chest pain: Secondary | ICD-10-CM | POA: Diagnosis not present

## 2015-02-23 DIAGNOSIS — E785 Hyperlipidemia, unspecified: Secondary | ICD-10-CM | POA: Diagnosis present

## 2015-02-23 DIAGNOSIS — M542 Cervicalgia: Secondary | ICD-10-CM | POA: Diagnosis not present

## 2015-02-23 DIAGNOSIS — R51 Headache: Secondary | ICD-10-CM | POA: Insufficient documentation

## 2015-02-23 DIAGNOSIS — R079 Chest pain, unspecified: Secondary | ICD-10-CM | POA: Diagnosis not present

## 2015-02-23 DIAGNOSIS — I10 Essential (primary) hypertension: Secondary | ICD-10-CM | POA: Insufficient documentation

## 2015-02-23 DIAGNOSIS — N179 Acute kidney failure, unspecified: Secondary | ICD-10-CM | POA: Insufficient documentation

## 2015-02-23 DIAGNOSIS — Z720 Tobacco use: Secondary | ICD-10-CM | POA: Diagnosis present

## 2015-02-23 DIAGNOSIS — M109 Gout, unspecified: Secondary | ICD-10-CM | POA: Diagnosis not present

## 2015-02-23 DIAGNOSIS — Z955 Presence of coronary angioplasty implant and graft: Secondary | ICD-10-CM | POA: Insufficient documentation

## 2015-02-23 DIAGNOSIS — E079 Disorder of thyroid, unspecified: Secondary | ICD-10-CM

## 2015-02-23 DIAGNOSIS — K219 Gastro-esophageal reflux disease without esophagitis: Secondary | ICD-10-CM | POA: Diagnosis present

## 2015-02-23 DIAGNOSIS — F1010 Alcohol abuse, uncomplicated: Secondary | ICD-10-CM | POA: Insufficient documentation

## 2015-02-23 DIAGNOSIS — E039 Hypothyroidism, unspecified: Secondary | ICD-10-CM | POA: Diagnosis present

## 2015-02-23 DIAGNOSIS — I119 Hypertensive heart disease without heart failure: Secondary | ICD-10-CM | POA: Diagnosis present

## 2015-02-23 DIAGNOSIS — N2889 Other specified disorders of kidney and ureter: Secondary | ICD-10-CM | POA: Diagnosis not present

## 2015-02-23 DIAGNOSIS — I739 Peripheral vascular disease, unspecified: Secondary | ICD-10-CM | POA: Insufficient documentation

## 2015-02-23 DIAGNOSIS — E038 Other specified hypothyroidism: Secondary | ICD-10-CM

## 2015-02-23 DIAGNOSIS — I2510 Atherosclerotic heart disease of native coronary artery without angina pectoris: Secondary | ICD-10-CM | POA: Diagnosis not present

## 2015-02-23 DIAGNOSIS — K118 Other diseases of salivary glands: Secondary | ICD-10-CM | POA: Diagnosis not present

## 2015-02-23 HISTORY — DX: Hyperlipidemia, unspecified: E78.5

## 2015-02-23 HISTORY — DX: Atherosclerotic heart disease of native coronary artery without angina pectoris: I25.10

## 2015-02-23 HISTORY — DX: Essential (primary) hypertension: I10

## 2015-02-23 HISTORY — DX: Gout, unspecified: M10.9

## 2015-02-23 HISTORY — DX: Hypothyroidism, unspecified: E03.9

## 2015-02-23 HISTORY — DX: Tobacco use: Z72.0

## 2015-02-23 HISTORY — DX: Gastro-esophageal reflux disease without esophagitis: K21.9

## 2015-02-23 LAB — PROTIME-INR
INR: 1.01 (ref 0.00–1.49)
PROTHROMBIN TIME: 13.5 s (ref 11.6–15.2)

## 2015-02-23 LAB — CBC
HCT: 40.6 % (ref 36.0–46.0)
Hemoglobin: 14.1 g/dL (ref 12.0–15.0)
MCH: 28.9 pg (ref 26.0–34.0)
MCHC: 34.7 g/dL (ref 30.0–36.0)
MCV: 83.2 fL (ref 78.0–100.0)
PLATELETS: 252 10*3/uL (ref 150–400)
RBC: 4.88 MIL/uL (ref 3.87–5.11)
RDW: 14.3 % (ref 11.5–15.5)
WBC: 3.9 10*3/uL — AB (ref 4.0–10.5)

## 2015-02-23 LAB — BASIC METABOLIC PANEL
Anion gap: 11 (ref 5–15)
BUN: 9 mg/dL (ref 6–20)
CO2: 23 mmol/L (ref 22–32)
CREATININE: 1.45 mg/dL — AB (ref 0.44–1.00)
Calcium: 9.3 mg/dL (ref 8.9–10.3)
Chloride: 104 mmol/L (ref 101–111)
GFR, EST AFRICAN AMERICAN: 40 mL/min — AB (ref >60)
GFR, EST NON AFRICAN AMERICAN: 34 mL/min — AB (ref >60)
Glucose, Bld: 171 mg/dL — ABNORMAL HIGH (ref 65–99)
Potassium: 3.3 mmol/L — ABNORMAL LOW (ref 3.5–5.1)
SODIUM: 138 mmol/L (ref 135–145)

## 2015-02-23 LAB — CREATININE, URINE, RANDOM: CREATININE, URINE: 124.77 mg/dL

## 2015-02-23 LAB — SODIUM, URINE, RANDOM: Sodium, Ur: 49 mmol/L

## 2015-02-23 LAB — BRAIN NATRIURETIC PEPTIDE: B NATRIURETIC PEPTIDE 5: 48.1 pg/mL (ref 0.0–100.0)

## 2015-02-23 LAB — TROPONIN I

## 2015-02-23 LAB — I-STAT TROPONIN, ED: TROPONIN I, POC: 0.03 ng/mL (ref 0.00–0.08)

## 2015-02-23 MED ORDER — NICOTINE 21 MG/24HR TD PT24
21.0000 mg | MEDICATED_PATCH | Freq: Every day | TRANSDERMAL | Status: DC
  Administered 2015-02-24 – 2015-02-25 (×2): 21 mg via TRANSDERMAL
  Filled 2015-02-23 (×2): qty 1

## 2015-02-23 MED ORDER — LEVOTHYROXINE SODIUM 50 MCG PO TABS
50.0000 ug | ORAL_TABLET | Freq: Every day | ORAL | Status: DC
  Administered 2015-02-24 – 2015-02-25 (×2): 50 ug via ORAL
  Filled 2015-02-23 (×3): qty 1

## 2015-02-23 MED ORDER — HYDROXYZINE HCL 50 MG/ML IM SOLN
25.0000 mg | Freq: Four times a day (QID) | INTRAMUSCULAR | Status: DC | PRN
  Filled 2015-02-23: qty 0.5

## 2015-02-23 MED ORDER — ALLOPURINOL 100 MG PO TABS
100.0000 mg | ORAL_TABLET | Freq: Every day | ORAL | Status: DC
  Administered 2015-02-24 – 2015-02-25 (×2): 100 mg via ORAL
  Filled 2015-02-23 (×2): qty 1

## 2015-02-23 MED ORDER — ASPIRIN 81 MG PO CHEW
81.0000 mg | CHEWABLE_TABLET | Freq: Every day | ORAL | Status: DC
  Administered 2015-02-24 – 2015-02-25 (×3): 81 mg via ORAL
  Filled 2015-02-23 (×3): qty 1

## 2015-02-23 MED ORDER — CLOPIDOGREL BISULFATE 75 MG PO TABS
75.0000 mg | ORAL_TABLET | Freq: Every day | ORAL | Status: DC
  Administered 2015-02-24 – 2015-02-25 (×2): 75 mg via ORAL
  Filled 2015-02-23 (×2): qty 1

## 2015-02-23 MED ORDER — ACETAMINOPHEN 325 MG PO TABS
650.0000 mg | ORAL_TABLET | Freq: Once | ORAL | Status: AC
  Administered 2015-02-23: 650 mg via ORAL
  Filled 2015-02-23: qty 2

## 2015-02-23 MED ORDER — ISOSORBIDE MONONITRATE ER 30 MG PO TB24
30.0000 mg | ORAL_TABLET | Freq: Every day | ORAL | Status: DC
  Administered 2015-02-24 – 2015-02-25 (×2): 30 mg via ORAL
  Filled 2015-02-23 (×2): qty 1

## 2015-02-23 MED ORDER — OXYCODONE-ACETAMINOPHEN 5-325 MG PO TABS
1.0000 | ORAL_TABLET | ORAL | Status: DC | PRN
Start: 2015-02-23 — End: 2015-02-25

## 2015-02-23 MED ORDER — POTASSIUM CHLORIDE CRYS ER 20 MEQ PO TBCR
20.0000 meq | EXTENDED_RELEASE_TABLET | Freq: Once | ORAL | Status: AC
  Administered 2015-02-24: 20 meq via ORAL
  Filled 2015-02-23: qty 1

## 2015-02-23 MED ORDER — HEPARIN SODIUM (PORCINE) 5000 UNIT/ML IJ SOLN
5000.0000 [IU] | Freq: Three times a day (TID) | INTRAMUSCULAR | Status: DC
  Administered 2015-02-24 – 2015-02-25 (×4): 5000 [IU] via SUBCUTANEOUS
  Filled 2015-02-23 (×4): qty 1

## 2015-02-23 MED ORDER — ACETAMINOPHEN 325 MG PO TABS
650.0000 mg | ORAL_TABLET | Freq: Four times a day (QID) | ORAL | Status: DC | PRN
  Administered 2015-02-24: 650 mg via ORAL
  Filled 2015-02-23: qty 2

## 2015-02-23 MED ORDER — PANTOPRAZOLE SODIUM 40 MG PO TBEC
40.0000 mg | DELAYED_RELEASE_TABLET | Freq: Every day | ORAL | Status: DC
  Administered 2015-02-24 – 2015-02-25 (×2): 40 mg via ORAL
  Filled 2015-02-23: qty 1
  Filled 2015-02-23: qty 2

## 2015-02-23 MED ORDER — ACETAMINOPHEN 650 MG RE SUPP
650.0000 mg | Freq: Four times a day (QID) | RECTAL | Status: DC | PRN
  Filled 2015-02-23: qty 1

## 2015-02-23 MED ORDER — SODIUM CHLORIDE 0.9 % IV SOLN
INTRAVENOUS | Status: DC
  Administered 2015-02-24: via INTRAVENOUS

## 2015-02-23 MED ORDER — SODIUM CHLORIDE 0.9 % IJ SOLN
3.0000 mL | Freq: Two times a day (BID) | INTRAMUSCULAR | Status: DC
  Administered 2015-02-24 (×2): 3 mL via INTRAVENOUS

## 2015-02-23 MED ORDER — NIFEDIPINE ER 60 MG PO TB24
60.0000 mg | ORAL_TABLET | Freq: Every day | ORAL | Status: DC
  Administered 2015-02-24 – 2015-02-25 (×2): 60 mg via ORAL
  Filled 2015-02-23 (×2): qty 1

## 2015-02-23 MED ORDER — ATORVASTATIN CALCIUM 40 MG PO TABS
40.0000 mg | ORAL_TABLET | Freq: Every day | ORAL | Status: DC
Start: 2015-02-24 — End: 2015-02-25
  Administered 2015-02-24: 40 mg via ORAL
  Filled 2015-02-23: qty 1

## 2015-02-23 MED ORDER — MORPHINE SULFATE (PF) 2 MG/ML IV SOLN: 2.0000 mg | INTRAVENOUS | Status: DC | PRN

## 2015-02-23 MED ORDER — NITROGLYCERIN 0.4 MG SL SUBL: 0.4000 mg | SUBLINGUAL_TABLET | SUBLINGUAL | Status: DC | PRN

## 2015-02-23 MED ORDER — ALUM & MAG HYDROXIDE-SIMETH 200-200-20 MG/5ML PO SUSP: 30.0000 mL | Freq: Four times a day (QID) | ORAL | Status: DC | PRN

## 2015-02-23 MED ORDER — HYDRALAZINE HCL 20 MG/ML IJ SOLN: 5.0000 mg | INTRAMUSCULAR | Status: DC | PRN

## 2015-02-23 MED ORDER — METOPROLOL SUCCINATE ER 25 MG PO TB24
25.0000 mg | ORAL_TABLET | Freq: Every day | ORAL | Status: DC
  Administered 2015-02-24 – 2015-02-25 (×2): 25 mg via ORAL
  Filled 2015-02-23 (×2): qty 1

## 2015-02-23 NOTE — H&P (Signed)
[facility] History and Physical  JEROLYN OSTERLUND [id number] DOB: 1941-02-05 DOA: 02/23/2015  Referring physician: ED physician PCP: No primary care provider on file.  Specialists:   Chief Complaint:  Chest pain, diarrhea and neck pain  HPI: Joan Elliott is a 74 y.o. female with PMH of hypertension, GERD, hypothyroidism, gout, hyperlipidemia, CAD (s/p of stent 2003), who presents with chest pain, neck pain and diarrhea.  Pt reports that in the past 3-4 days, she has been having intermittent chest pain. The chest pain is located in the substernal area, mild, nonradiating. It is not aggravated or alleviated of any known factors. It is not pleuritic. No tenderness over the calf area. It happens approximately 1-2 times each day, and it lasts for about 5 minutes each time. It is associated with mild sore shortness of breath and mild cough. No sputum production, fever or chills. She also reports that in the past 4 days, she has been having swelling and tenderness over right submandibular area in the neck. She states the swelling is improving, but the pain has persisted. Denies any mouth pain or lesions. Denies trouble moving her head. She reports that she has had a sore throat and increased mucus in her throat for the last several days, which have resolved. She also has mild diarrhea in the past 2 weeks. She states that she has loose stool every morning, without recent antibiotics use. She has mild nausea, but no vomiting or diarrhea. No symptoms of UTI, no unilateral weakness.  In ED, patient was found to have WBC 3.9, negative troponin, temperature normal, no tachycardia, potassium 3.3, creatinine (not noted a baseline creatinine), negative chest x-ray. Patient is admitted to inpatient for further evaluation and treatment.  Where does patient live?   At home    Can patient participate in ADLs?   Little    Review of Systems:   General: no fevers, chills, no changes in body weight, has poor  appetite, has fatigue HEENT: no blurry vision, hearing changes or sore throat. Has neck pain. Pulm: has mild dyspnea, coughing, no wheezing CV: has chest pain, no palpitations Abd: has nausea, no vomiting, abdominal pain, has diarrhea, no constipation GU: no dysuria, burning on urination, increased urinary frequency, hematuria  Ext: has mild leg edema Neuro: no unilateral weakness, numbness, or tingling, no vision change or hearing loss Skin: no rash MSK: No muscle spasm, no deformity, no limitation of range of movement in spin Heme: No easy bruising.  Travel history: No recent long distant travel.  Allergy:  Allergies  Allergen Reactions  . Ivp Dye [Iodinated Diagnostic Agents] Nausea And Vomiting  . Penicillins Rash    Past Medical History  Diagnosis Date  . Hypertension   . Thyroid disease   . Coronary artery disease     s/p stent mid RCA 2003  . Tobacco abuse   . Hypothyroidism   . HLD (hyperlipidemia)   . Gout   . GERD (gastroesophageal reflux disease)     Past Surgical History  Procedure Laterality Date  . Abdominal hysterectomy    . Thyroid surgery      Social History:  reports that she has been smoking Cigarettes.  She has been smoking about 0.50 packs per day. She does not have any smokeless tobacco history on file. Her alcohol and drug histories are not on file.  Family History:  Family History  Problem Relation Age of Onset  . Hypertension Mother   . Kidney disease Mother   .  Hypertension Brother   . Hypertension Sister      Prior to Admission medications   Not on File    Physical Exam: Filed Vitals:   02/23/15 1930 02/23/15 2000 02/23/15 2030 02/23/15 2107  BP: 153/90 134/78 136/78 133/74  Pulse: 74 69 68 75  Temp:    98.5 F (36.9 C)  TempSrc:    Oral  Resp: 12  16 18   Height:    5' (1.524 m)  Weight:    96.616 kg (213 lb)  SpO2: 95% 98% 98% 99%   General: Not in acute distress HEENT:       Eyes: PERRL, EOMI, no scleral icterus.        ENT: No discharge from the ears and nose, no pharynx injection, no tonsillar enlargement. There is a lymph node enlargement over right submandibular area, approximately 22 cm in size, with tenderness.       Neck: No JVD, no bruit, no mass felt. Heme: No neck lymph node enlargement. Cardiac: S1/S2, RRR, No murmurs, No gallops or rubs. Pulm:  No rales, wheezing, rhonchi or rubs. Abd: Soft, nondistended, nontender, no rebound pain, no organomegaly, BS present. Ext: trace leg edema bilaterally. 2+DP/PT pulse bilaterally. Musculoskeletal: No joint deformities, No joint redness or warmth, no limitation of ROM in spin. Skin: No rashes.  Neuro: Alert, oriented X3, cranial nerves II-XII grossly intact, muscle strength 5/5 in all extremities, sensation to light touch intact.  Psych: Patient is not psychotic, no suicidal or hemocidal ideation.  Labs on Admission:  Basic Metabolic Panel:  Recent Labs Lab 02/23/15 1231  NA 138  K 3.3*  CL 104  CO2 23  GLUCOSE 171*  BUN 9  CREATININE 1.45*  CALCIUM 9.3   Liver Function Tests: No results for input(s): AST, ALT, ALKPHOS, BILITOT, PROT, ALBUMIN in the last 168 hours. No results for input(s): LIPASE, AMYLASE in the last 168 hours. No results for input(s): AMMONIA in the last 168 hours. CBC:  Recent Labs Lab 02/23/15 1231  WBC 3.9*  HGB 14.1  HCT 40.6  MCV 83.2  PLT 252   Cardiac Enzymes: No results for input(s): CKTOTAL, CKMB, CKMBINDEX, TROPONINI in the last 168 hours.  BNP (last 3 results)  Recent Labs  02/23/15 1941  BNP 48.1    ProBNP (last 3 results) No results for input(s): PROBNP in the last 8760 hours.  CBG: No results for input(s): GLUCAP in the last 168 hours.  Radiological Exams on Admission: Dg Chest 2 View  02/23/2015   CLINICAL DATA:  Acute chest pain.  EXAM: CHEST  2 VIEW  COMPARISON:  August 14, 2014.  FINDINGS: The heart size and mediastinal contours are within normal limits. Both lungs are clear. No  pneumothorax or pleural effusion is noted. The visualized skeletal structures are unremarkable.  IMPRESSION: No active cardiopulmonary disease.   Electronically Signed   By: Marijo Conception, M.D.   On: 02/23/2015 13:34    EKG: Independently reviewed.  Abnormal findings:  QTC 484, BAE, LAD, T-wave inversion and mild ST depression in V5-V6, no old EKG to compare with.  Assessment/Plan Principal Problem:   Chest pain Active Problems:   Hypertension   Coronary artery disease   Thyroid disease   Hypokalemia   AKI (acute kidney injury)   Tobacco abuse   Hypothyroidism   HLD (hyperlipidemia)   Gout   GERD (gastroesophageal reflux disease)   Diarrhea   Chest pain: Patient has atypical and intermittent chest pain. No pneumonia on chest  x-ray. Currently no chest pain, less likely to have pulmonary embolism. Will admitted for chest pain rule out. - will admit to Tele bed  - cycle CE q6 x3 and repeat her EKG in the am  - Nitroglycerin, Morphine, and plavix, lipitor, isosorbide mono nitrate - Start ASA 81 mg daily - Risk factor stratification: will check FLP, TSH and A1C  - Consider cardiology consult if test positive for CEs  - 2d echo  Coronary artery disease: s/p of stent 2003. -see above   HTN: -continue metoprolol, Nifedipine -hold lisinopril due to possible AKI -IV hydralazine when necessary  Hypothyroidism: Last TSH was not on record -Continue home Synthroid -Check TSH  Hypokalemia: K= 3.3 on admission. - Repleted - Check Mg level  Possible AKI: Creatinine 1.45, no old records, not sure whether patient has chronic kidney disease. If this is truly AKI, it is ikely due to prerenal secondary to dehydration and continuation of ACEI - IVF: ns 75 cc/h - Check FeNa - US-renal - Follow up renal function by BMP - hold lisinopril  Tobacco abuse and Alcohol abuse: -Did counseling about importance of quitting smoking -Nicotine patch  HLD: Last LDL was not our  record -Lipitor -Check FLP  Gout: stable. -Continue allopurinol  GERD: -Protonix  Diarrhea: Patient states that she has been having diarrhea every morning for 2 weeks. Etiology is not clear. Likely due to viral enteritis, but he need to rule out other possibilities, such as C. difficile colitis. -check C diff pcr  Neck pain and swelling node over R submandibular area: Etiology is not clear, likely due to reactive reaction secondary to possible recent ovarian infection -CT-neck of soft tissues   DVT ppx: SQ Heparin   Code Status: Full code Family Communication: Yes, patient's son at bed side Disposition Plan: Admit to inpatient   Date of Service 02/23/2015    Ivor Costa [facility] Pager [phone]  If 7PM-7AM, please contact night-coverage [email or web address] Password [facility] 02/23/2015, 10:26 PM

## 2015-02-23 NOTE — ED Provider Notes (Signed)
Patient presented to the ER with chest pain. Patient has been experiencing intermittent pressure in the left side of her chest associated with shortness of breath since last night. She has not identified any alleviating or exacerbating factors. She does have a history of heart disease.  Patient also complaining of a tender lump on the right side of her neck. She reports that she has had a sore throat and increased mucus in her throat for the last several days.  Face to face Exam: HEENT - PERRLA Neck - tender nodule right submandibular area, nonfluctuant, no overlying erythema, induration Lungs - CTAB Heart - RRR, no M/R/G Abd - S/NT/ND Neuro - alert, oriented x3  Plan: Patient with previous cardiac history presents with chest pain. EKG today with ST depressions laterally, no comparison available. Patient not experiencing chest pain currently. Will require further evaluation for chest pain.  With tender nodule on right side of neck. Area is small, consistent with a lymph node. No overlying erythema. She reports sore throat, but oropharyngeal examination is unremarkable.  Orpah Greek, MD 02/23/15 [phone]

## 2015-02-23 NOTE — ED Notes (Signed)
Pt reports chest pressure and sob that started last night while laying in bed. Pt also reports knot under rt side of chin that is tender to palpation. Pt has a headache as well. Neuro intact.

## 2015-02-23 NOTE — ED Provider Notes (Signed)
History   Chief Complaint  Patient presents with  . Chest Pain    HPI  Joan Elliott is a 74 y.o. female with PMH as below notable for CAD s/p stend mid RCA 2003 who presents to ED with c/o CP x 4 days. Pain is described as gradual intermittent chest tightness pressure-like, located substernal and does not radiate. Comes on at rest and exertion. Aggravating factors:exercise and walking.  Alleviating factors: rest.  Associated sxs include diaphoresis nausea shortness of breath.  Pt denies recent illness, f/c, h/o DVT/PE, leg pain.  Hx of similar symptoms: yes, now more frequent and coming on at rest however.    Additionally, patient is complaining of swelling to her right submandibular neck which is very tender. This is been there for 4 days. No history of prior. She denies fevers, redness. She states the swelling is improving but the pain has persisted. Denies any mouth pain or lesions. Denies trouble moving her head.  Patient also reports having a mild headache currently. Headache is described as typical headache. She has not had her nitroglycerin recently. Denies focal weakness, numbness, tingling, vision changes. Headache was slow in onset. Not worst headache of life. Denies trauma.  Past medical/surgical history, social history, medications, allergies and FH have been reviewed with patient and/or in documentation.  Past Medical History  Diagnosis Date  . Hypertension   . Thyroid disease   . Coronary artery disease     s/p stent mid RCA 2003   Past Surgical History  Procedure Laterality Date  . Abdominal hysterectomy    . Thyroid surgery     No family history on file. Social History  Substance Use Topics  . Smoking status: Current Every Day Smoker -- 0.50 packs/day    Types: Cigarettes  . Smokeless tobacco: None  . Alcohol Use: None     Review of Systems Constitutional: Negative for fever, chills and fatigue. + diaphoresis HENT: Negative for congestion, rhinorrhea  and sore throat.   Eyes: Negative for visual disturbance.  Respiratory: + SOB, Negative for cough and wheezing.   Cardiovascular: + for CP.  Gastrointestinal: + nausea, - vomiting; denies abdominal pain and diarrhea.  Genitourinary: Negative for flank pain, dysuria, frequency.  Musculoskeletal: Negative for back pain, neck pain and neck stiffness, leg pain/swelling.  Skin: Negative for rash.  Neurological: Negative for dizziness and headaches.  All other systems reviewed and are negative.   Physical Exam  Physical Exam  ED Triage Vitals  Enc Vitals Group     BP 02/23/15 1220 155/88 mmHg     Pulse Rate 02/23/15 1220 89     Resp 02/23/15 1220 18     Temp 02/23/15 1220 97.9 F (36.6 C)     Temp Source 02/23/15 1220 Oral     SpO2 02/23/15 1220 96 %     Weight 02/23/15 1220 213 lb (96.616 kg)     Height 02/23/15 1220 5' (1.524 m)     Head Cir --      Peak Flow --      Pain Score 02/23/15 1221 9     Pain Loc --      Pain Edu? --      Excl. in [place]? --    Constitutional: Chronically ill-appearing 74 year old female in no apparent distress. Head: Normocephalic and atraumatic.  Eyes: Extraocular motion intact, no scleral icterus Neck: Supple without meningismus, or overt JVD. Small mass to R submandibular region which is TTP. No redness or warmth. Mouth: no  oral lesion noted. Respiratory: Effort normal and breath sounds normal. No respiratory distress. Chest wall: nonreproducible CP on exam CV: Heart regular rate and rhythm, no obvious murmurs.  Pulses +2 and symmetric. Euvolemic on exam. Abdomen: Soft, non-tender, non-distended MSK: Extremities are atraumatic without deformity, ROM intact. No calf TTP. Mild BLEs edema. Skin: Warm, dry, intact. Neuro: Alert and oriented, no motor deficit noted Psychiatric: Mood and affect are normal.    ED Course  Procedures   Labs Reviewed  BASIC METABOLIC PANEL - Abnormal; Notable for the following:    Potassium 3.3 (*)    Glucose, Bld  171 (*)    Creatinine, Ser 1.45 (*)    GFR calc non Af Amer 34 (*)    GFR calc Af Amer 40 (*)    All other components within normal limits  CBC - Abnormal; Notable for the following:    WBC 3.9 (*)    All other components within normal limits  I-STAT TROPOININ, ED   I personally reviewed and interpreted all labs.  Dg Chest 2 View  02/23/2015   CLINICAL DATA:  Acute chest pain.  EXAM: CHEST  2 VIEW  COMPARISON:  August 14, 2014.  FINDINGS: The heart size and mediastinal contours are within normal limits. Both lungs are clear. No pneumothorax or pleural effusion is noted. The visualized skeletal structures are unremarkable.  IMPRESSION: No active cardiopulmonary disease.   Electronically Signed   By: Marijo Conception, M.D.   On: 02/23/2015 13:34   I personally viewed above image(s) which were used in my medical decision making. Formal interpretations by Radiology.   EKG Interpretation  Date/Time:  Monday February 23 2015 12:25:00 EDT Ventricular Rate:  92 PR Interval:  142 QRS Duration: 118 QT Interval:  392 QTC Calculation: 484 R Axis:   -73 Text Interpretation:  Normal sinus rhythm Right atrial enlargement Left axis deviation Anterior infarct , age undetermined T wave abnormality, consider lateral ischemia Abnormal ECG No previous tracing Confirmed by Betsey Holiday  MD, CHRISTOPHER [phone]) on 02/23/2015 6:40:11 PM       MDM: [name] is a 74 y.o. female who p/w CP. ABCs intact. HDS, NAD. Cardiac w/u initiated.   On arrival, patient is stable and in no distress. Currently not having chest pain. EKG shows T-wave inversions in lateral leads there are no old EKGs for comparison. No other signs of ST elevation or ST depression.  Pain does not radiate to back, blood pressure is stable. No mediastinal widening on CXR. Dissection unlikely. Pt's breath sounds are equal bilaterally. No PTX on CXR. PTX unlikely. Pain is not associated with meals. GERD unlikely. Pt has Wells score of 0, PE  unlikely. Pain is not positional and EKG is WNL. Pericarditis unlikely. No h/o cocaine use. Pt reports no cough or fever. No infiltrate on CXR. PNA unlikely. No Hx of exertion, trauma. Pain is non-reproducible. Costochondritis unlikely.  As for patient's headache, patient has a benign neuro exam and this was slow in onset and typical for her headaches. No indication for further workup at this time. As for patient's right neck swelling, patient reports having recent sore throat and states swelling is improving and there are no signs of overlying infection. I think this is likely sialoadenitis. He no indication for further workup for this at this time.  Discussed case with hospitalist who will admit for ACS rule out.  Clinical Impression: 1. Essential hypertension   2. Thyroid disease   3. Chest pain, rule out  acute myocardial infarction   4. Tobacco abuse     Disposition: Admit  Condition: stable  I have discussed the results, Dx and Tx plan with the pt(& family if present). He/she/they expressed understanding and agree(s) with the plan.  Pt seen in conjunction with Dr. Orpah Greek, MD  Kirstie Peri, [facility] Resident - PGY-3           Kirstie Peri, MD 02/24/15 [id number]  Orpah Greek, MD 02/24/15 1520

## 2015-02-23 NOTE — Progress Notes (Signed)
Pt arrived to unit per stretcher. VSS. No complaints of pain. Pt oriented to room. Call bell within place. Will continue to monitor closely.  Raliegh Ip RN

## 2015-02-23 NOTE — ED Notes (Signed)
Pt denies having any chest pain and sts "I got a little short of breath earlier but that's normal for me.  I have asthma.  I feel fine now except for my neck hurting."

## 2015-02-24 ENCOUNTER — Ambulatory Visit (HOSPITAL_BASED_OUTPATIENT_CLINIC_OR_DEPARTMENT_OTHER): Payer: Commercial Managed Care - HMO

## 2015-02-24 DIAGNOSIS — R0789 Other chest pain: Secondary | ICD-10-CM | POA: Diagnosis not present

## 2015-02-24 DIAGNOSIS — R079 Chest pain, unspecified: Secondary | ICD-10-CM | POA: Diagnosis not present

## 2015-02-24 DIAGNOSIS — I10 Essential (primary) hypertension: Secondary | ICD-10-CM | POA: Diagnosis not present

## 2015-02-24 DIAGNOSIS — N179 Acute kidney failure, unspecified: Secondary | ICD-10-CM | POA: Diagnosis not present

## 2015-02-24 DIAGNOSIS — E876 Hypokalemia: Secondary | ICD-10-CM | POA: Diagnosis not present

## 2015-02-24 DIAGNOSIS — Z720 Tobacco use: Secondary | ICD-10-CM | POA: Diagnosis not present

## 2015-02-24 DIAGNOSIS — I739 Peripheral vascular disease, unspecified: Secondary | ICD-10-CM | POA: Diagnosis not present

## 2015-02-24 DIAGNOSIS — E785 Hyperlipidemia, unspecified: Secondary | ICD-10-CM | POA: Diagnosis not present

## 2015-02-24 DIAGNOSIS — R0609 Other forms of dyspnea: Secondary | ICD-10-CM

## 2015-02-24 DIAGNOSIS — R197 Diarrhea, unspecified: Secondary | ICD-10-CM | POA: Diagnosis not present

## 2015-02-24 DIAGNOSIS — E669 Obesity, unspecified: Secondary | ICD-10-CM | POA: Diagnosis not present

## 2015-02-24 DIAGNOSIS — E079 Disorder of thyroid, unspecified: Secondary | ICD-10-CM

## 2015-02-24 DIAGNOSIS — I2510 Atherosclerotic heart disease of native coronary artery without angina pectoris: Secondary | ICD-10-CM

## 2015-02-24 LAB — COMPREHENSIVE METABOLIC PANEL
ALT: 13 U/L — ABNORMAL LOW (ref 14–54)
ANION GAP: 9 (ref 5–15)
AST: 21 U/L (ref 15–41)
Albumin: 3.4 g/dL — ABNORMAL LOW (ref 3.5–5.0)
Alkaline Phosphatase: 76 U/L (ref 38–126)
BILIRUBIN TOTAL: 0.7 mg/dL (ref 0.3–1.2)
BUN: 7 mg/dL (ref 6–20)
CO2: 24 mmol/L (ref 22–32)
Calcium: 8.9 mg/dL (ref 8.9–10.3)
Chloride: 110 mmol/L (ref 101–111)
Creatinine, Ser: 1.08 mg/dL — ABNORMAL HIGH (ref 0.44–1.00)
GFR, EST AFRICAN AMERICAN: 57 mL/min — AB (ref >60)
GFR, EST NON AFRICAN AMERICAN: 49 mL/min — AB (ref >60)
Glucose, Bld: 101 mg/dL — ABNORMAL HIGH (ref 65–99)
POTASSIUM: 3.8 mmol/L (ref 3.5–5.1)
Sodium: 143 mmol/L (ref 135–145)
TOTAL PROTEIN: 6.5 g/dL (ref 6.5–8.1)

## 2015-02-24 LAB — PROTIME-INR
INR: 1.11 (ref 0.00–1.49)
Prothrombin Time: 14.5 seconds (ref 11.6–15.2)

## 2015-02-24 LAB — LIPID PANEL
CHOL/HDL RATIO: 3.4 ratio
CHOLESTEROL: 197 mg/dL (ref 0–200)
HDL: 58 mg/dL (ref >40)
LDL Cholesterol: 118 mg/dL — ABNORMAL HIGH (ref 0–99)
Triglycerides: 105 mg/dL (ref <150)
VLDL: 21 mg/dL (ref 0–40)

## 2015-02-24 LAB — CBC
HEMATOCRIT: 39.1 % (ref 36.0–46.0)
Hemoglobin: 13.6 g/dL (ref 12.0–15.0)
MCH: 28.9 pg (ref 26.0–34.0)
MCHC: 34.8 g/dL (ref 30.0–36.0)
MCV: 83 fL (ref 78.0–100.0)
Platelets: 230 10*3/uL (ref 150–400)
RBC: 4.71 MIL/uL (ref 3.87–5.11)
RDW: 14.2 % (ref 11.5–15.5)
WBC: 3.1 10*3/uL — AB (ref 4.0–10.5)

## 2015-02-24 LAB — RAPID URINE DRUG SCREEN, HOSP PERFORMED
AMPHETAMINES: NOT DETECTED
BENZODIAZEPINES: NOT DETECTED
Barbiturates: NOT DETECTED
COCAINE: NOT DETECTED
OPIATES: POSITIVE — AB
TETRAHYDROCANNABINOL: POSITIVE — AB

## 2015-02-24 LAB — TROPONIN I: Troponin I: <0.03 ng/mL (ref <0.031)

## 2015-02-24 LAB — TSH: TSH: 2.704 u[IU]/mL (ref 0.350–4.500)

## 2015-02-24 LAB — MAGNESIUM: MAGNESIUM: 2 mg/dL (ref 1.7–2.4)

## 2015-02-24 MED ORDER — POTASSIUM CHLORIDE CRYS ER 20 MEQ PO TBCR
40.0000 meq | EXTENDED_RELEASE_TABLET | Freq: Once | ORAL | Status: AC
  Administered 2015-02-24: 40 meq via ORAL
  Filled 2015-02-24: qty 2

## 2015-02-24 MED ORDER — REGADENOSON 0.4 MG/5ML IV SOLN
0.4000 mg | Freq: Once | INTRAVENOUS | Status: AC
  Administered 2015-02-25: 0.4 mg via INTRAVENOUS
  Filled 2015-02-24: qty 5

## 2015-02-24 NOTE — Care Management Note (Signed)
Case Management Note Joan Gibbons RN, BSN Unit 2W-Case Manager [phone]  Patient Details  Name: Joan Elliott MRN: [id number] Date of Birth: August 19, 1940  Subjective/Objective:   Pt admitted with chest pain                 Action/Plan: PTA pt lived at home- recently moved here- PCP - Lucianne Lei Per pt she has a walker at home but it is old and does not have wheels- could use a new RW walker will ask MD for DME order- RW  Expected Discharge Date:                  Expected Discharge Plan:  Home/Self Care  In-House Referral:     Discharge planning Services  CM Consult  Post Acute Care Choice:    Choice offered to:     DME Arranged:    DME Agency:     HH Arranged:    [place] Agency:     Status of Service:  In process, will continue to follow  Medicare Important Message Given:    Date Medicare IM Given:    Medicare IM give by:    Date Additional Medicare IM Given:    Additional Medicare Important Message give by:     If discussed at [place] of Stay Meetings, dates discussed:    Additional Comments:  Dawayne Patricia, RN 02/24/2015, 2:39 PM

## 2015-02-24 NOTE — Progress Notes (Signed)
This admission has been reviewed and determined not to meet inpatient level of care. Both attending Physician and Medical Director are in agreement this should be an Observation encounter according to the Medicare Conditions of Participation as set forth in CFR 42 Chapter 456 482.12 (c) and the Medicare Condition Code-44 Regulations CFR 42 Chapter 100 - 04 50.3. The Patient and/or Patient Representative was notified via delivery of the "MEDICARE OBSERVATION STATUS NOTIFICATION".              

## 2015-02-24 NOTE — Progress Notes (Signed)
MD paged regarding pt still complaining of loose stools. New orders given to reinstate enteric precautions. Will continue to monitor.  Raliegh Ip RN

## 2015-02-24 NOTE — Evaluation (Signed)
Physical Therapy Evaluation Patient Details Name: Joan Elliott MRN: [id number] DOB: April 19, 1941 Today's Date: 02/24/2015   History of Present Illness  74 y.o. female with PMH of hypertension, GERD, hypothyroidism, gout, hyperlipidemia, CAD (s/p of stent 2003), who presented with chest pain, neck pain and diarrhea.  Clinical Impression  Pt admitted with above diagnosis. Pt currently with functional limitations due to the deficits listed below (see PT Problem List). At the time of PT eval pt was able to perform transfers and ambulation at a grossly supervision level with occasional min guard assist. Pt reports that she has L foot pain which she attributes to gout, and that has been limiting her mobility. She is able to keep weight off the medial part of her foot during ambulation and walk without pain. Pt reports she is at baseline of function, and does not wish to pursue HHPT at d/c. Pt will benefit from acute skilled PT to increase their independence and safety with mobility to allow discharge to the venue listed below.       Follow Up Recommendations No PT follow up    Equipment Recommendations  Rolling walker with 5" wheels    Recommendations for Other Services       Precautions / Restrictions Precautions Precautions: Fall Restrictions Weight Bearing Restrictions: No      Mobility  Bed Mobility Overal bed mobility: Needs Assistance Bed Mobility: Supine to Sit     Supine to sit: Supervision     General bed mobility comments: Pt sitting up on EOB when PT arrrived  Transfers Overall transfer level: Modified independent Equipment used: Rolling walker (2 wheeled) Transfers: Sit to/from Stand           General transfer comment: No physical assist. Good dynamic balance to reach for walker.   Ambulation/Gait Ambulation/Gait assistance: Supervision;Min guard Ambulation Distance (Feet): 200 Feet Assistive device: Rolling walker (2 wheeled) Gait Pattern/deviations:  Step-through pattern;Decreased stride length;Trunk flexed Gait velocity: Decreased Gait velocity interpretation: Below normal speed for age/gender General Gait Details: Pt was able to ambulate in hall with RW and supervision for safety. Pain in the L foot appeared to be managed with minimal WB, however a few occasions towards end of gait training, pt put weight fully through the LLE and had to stop due to pain.   Stairs            Wheelchair Mobility    Modified Rankin (Stroke Patients Only)       Balance Overall balance assessment: No apparent balance deficits (not formally assessed)                                           Pertinent Vitals/Pain Pain Assessment: Faces Pain Score:  (7-8) Faces Pain Scale: Hurts even more Pain Location: L foot during WB activity. Pt reports baseline RA pain Pain Descriptors / Indicators: Sharp;Sore Pain Intervention(s): Limited activity within patient's tolerance;Monitored during session;Repositioned    Home Living Family/patient expects to be discharged to:: Private residence Living Arrangements: Alone Available Help at Discharge: Other (Comment) (No family to check on her - could call only) Type of Home: Apartment Home Access: Level entry     Home Layout: One level Home Equipment: Cane - single point Additional Comments: Has a RW but says the wheel is broken. Has had >5 years    Prior Function Level of Independence: Independent with assistive device(s)  Comments: Uses the walker to walk to the store. Does not drive. Increased time and energy for bathing.      Hand Dominance   Dominant Hand: Right    Extremity/Trunk Assessment   Upper Extremity Assessment: Defer to OT evaluation       LUE Deficits / Details: Lt shoulder flexors weaker than right; reports h/o rotator cuff injury   Lower Extremity Assessment: LLE deficits/detail   LLE Deficits / Details: Pain in L foot from big toe up into  ankle. Decreased AROM of toes and DF/PF  Cervical / Trunk Assessment: Normal  Communication   Communication: No difficulties  Cognition Arousal/Alertness: Awake/alert Behavior During Therapy: WFL for tasks assessed/performed Overall Cognitive Status: Within Functional Limits for tasks assessed                      General Comments General comments (skin integrity, edema, etc.): Pt mildly dyspneic during mobility. States this is her baseline.    Exercises        Assessment/Plan    PT Assessment Patient needs continued PT services  PT Diagnosis Difficulty walking;Acute pain   PT Problem List Decreased strength;Decreased range of motion;Decreased activity tolerance;Decreased balance;Decreased mobility;Decreased knowledge of use of DME;Decreased safety awareness;Decreased knowledge of precautions;Pain  PT Treatment Interventions DME instruction;Gait training;Stair training;Functional mobility training;Therapeutic activities;Therapeutic exercise;Neuromuscular re-education;Patient/family education   PT Goals (Current goals can be found in the Care Plan section) Acute Rehab PT Goals Patient Stated Goal: Decrease pain PT Goal Formulation: With patient Time For Goal Achievement: 03/03/15 Potential to Achieve Goals: Good    Frequency Min 3X/week   Barriers to discharge Decreased caregiver support Has no family to physically come by during the days or evenings. Son can call and check on her only.    Co-evaluation               End of Session Equipment Utilized During Treatment: Gait belt Activity Tolerance: Patient tolerated treatment well Patient left: in bed;with call bell/phone within reach;with nursing/sitter in room Nurse Communication: Mobility status    Functional Assessment Tool Used: Clinical judgement Functional Limitation: Mobility: Walking and moving around Mobility: Walking and Moving Around Current Status [id number]): At least 1 percent but less than 20  percent impaired, limited or restricted Mobility: Walking and Moving Around Goal Status [id number]): At least 1 percent but less than 20 percent impaired, limited or restricted    Time: 1111-1128 PT Time Calculation (min) (ACUTE ONLY): 17 min   Charges:   PT Evaluation $Initial PT Evaluation Tier I: 1 Procedure     PT G Codes:   PT G-Codes **NOT FOR INPATIENT CLASS** Functional Assessment Tool Used: Clinical judgement Functional Limitation: Mobility: Walking and moving around Mobility: Walking and Moving Around Current Status [id number]): At least 1 percent but less than 20 percent impaired, limited or restricted Mobility: Walking and Moving Around Goal Status [phone]): At least 1 percent but less than 20 percent impaired, limited or restricted    Rolinda Roan 02/24/2015, 12:45 PM   Rolinda Roan, PT, DPT Acute Rehabilitation Services Pager: [phone]

## 2015-02-24 NOTE — Progress Notes (Signed)
  Echocardiogram 2D Echocardiogram has been performed.  Joan Elliott 02/24/2015, 3:22 PM

## 2015-02-24 NOTE — Evaluation (Addendum)
Occupational Therapy Evaluation Patient Details Name: Joan Elliott MRN: [id number] DOB: 1941/02/17 Today's Date: 02/24/2015    History of Present Illness 74 y.o. female with PMH of hypertension, GERD, hypothyroidism, gout, hyperlipidemia, CAD (s/p of stent 2003), who presented with chest pain, neck pain and diarrhea.   Clinical Impression   Pt admitted with above. Pt independent with ADLs, PTA. Feel pt will benefit from acute OT to reinforce energy conservation techniques.    Follow Up Recommendations  No OT follow up;Supervision - Intermittent    Equipment Recommendations   (3 in 1 vs shower seat to use in tub/shower)    Recommendations for Other Services       Precautions / Restrictions Precautions Precautions: Fall Restrictions Weight Bearing Restrictions: No      Mobility Bed Mobility Overal bed mobility: Needs Assistance Bed Mobility: Supine to Sit     Supine to sit: Supervision        Transfers Overall transfer level: Modified independent Equipment used: None                  Balance    No LOB in session.                                        ADL Overall ADL's : Needs assistance/impaired     Grooming: Wash/dry hands;Wash/dry face;Set up;Supervision/safety;Standing;Brushing hair       Lower Body Bathing: Supervison/ safety;Set up (standing)       Lower Body Dressing: Set up;Supervision/safety;Sit to/from stand   Toilet Transfer: Supervision/safety;Min guard;Ambulation (sit to stand from bed)           Functional mobility during ADLs: Supervision/safety;Min guard General ADL Comments: Reviewed tub transfer technique. Discussed getting a shower chair and sitting for LB bathing. Educated on energy conservation.  Pt with shortness of breath in session and reports this is baseline. She reports her balance feels off.     Vision     Perception     Praxis      Pertinent Vitals/Pain Pain Assessment: 0-10 Pain  Score:  (7-8) Pain Location: left foot Pain Descriptors / Indicators: Sore Pain Intervention(s): Monitored during session   HR up to around 100 in session, but trended down.     Hand Dominance Right   Extremity/Trunk Assessment Upper Extremity Assessment Upper Extremity Assessment: Generalized weakness;LUE deficits/detail (pain in shoulders when resistance applied; reports arthritis) LUE Deficits / Details: Lt shoulder flexors weaker than right; reports h/o rotator cuff injury   Lower Extremity Assessment Lower Extremity Assessment: Defer to PT evaluation       Communication Communication Communication: No difficulties   Cognition Arousal/Alertness: Awake/alert Behavior During Therapy: WFL for tasks assessed/performed Overall Cognitive Status: Within Functional Limits for tasks assessed                     General Comments       Exercises       Shoulder Instructions      Home Living Family/patient expects to be discharged to:: Private residence Living Arrangements: Alone Available Help at Discharge:  (reports she has no one) Type of Home: Apartment Home Access: Level entry     Home Layout: One level     Bathroom Shower/Tub: Teacher, early years/pre: Standard (sink close) Bathroom Accessibility: Yes   Home Equipment: Walker - 2 wheels   Additional Comments: Has a  RW but says the wheel is broken. Has had >5 years      Prior Functioning/Environment Level of Independence: Independent with assistive device(s)        Comments: Uses the walker to walk to the store. Does not drive. Increased time and energy for bathing.     OT Diagnosis: Generalized weakness;Acute pain   OT Problem List: Decreased activity tolerance;Decreased strength;Decreased knowledge of use of DME or AE;Pain   OT Treatment/Interventions: Self-care/ADL training;DME and/or AE instruction;Therapeutic activities;Patient/family education;Balance training;Energy  conservation;Therapeutic exercise    OT Goals(Current goals can be found in the care plan section) Acute Rehab OT Goals Patient Stated Goal: to eat OT Goal Formulation: With patient Time For Goal Achievement: 03/03/15 Potential to Achieve Goals: Good ADL Goals Pt Will Perform Tub/Shower Transfer: Tub transfer;with modified independence;ambulating;shower seat Additional ADL Goal #1: Pt will independently verbalize 3/3 energy conservation techniques and utilize at least 2 during session.  OT Frequency: Min 2X/week   Barriers to D/C:            Co-evaluation              End of Session Equipment Utilized During Treatment: Gait belt Nurse Communication:  (pt short of breath; pain in left foot)  Activity Tolerance: Patient tolerated treatment well Patient left: Other (comment) (sitting EOB with MD)   [id number] OT Time Calculation (min): 18 min Charges:  OT General Charges $OT Visit: 1 Procedure OT Evaluation $Initial OT Evaluation Tier I: 1 Procedure G-Codes: OT G-codes **NOT FOR INPATIENT CLASS** Functional Assessment Tool Used: clinical judgment Functional Limitation: Self care Self Care Current Status [id number]): At least 1 percent but less than 20 percent impaired, limited or restricted Self Care Goal Status [id number]): 0 percent impaired, limited or restricted  Benito Mccreedy OTR/L [id number] 02/24/2015, 11:30 AM

## 2015-02-24 NOTE — Progress Notes (Signed)
Utilization review completed.  

## 2015-02-24 NOTE — Consult Note (Signed)
Reason for Consult: Shortness of breath  Requesting Physician: Aileen Fass  HPI: Ms Malcom  is a 74 year old mildly overweight single African-American female mother of one son on dialysis who recently relocated from [place] to be closer to her son here in [place]. She has a history of hypertension, hyperlipidemia and ongoing tobacco abuse. She does have CAD status post stenting in 2003. She complains of chronic dyspnea on exertion and continues to smoke 3-4 cigarettes a day. She's had several episodes of substernal chest burning at rest.  Problem List: Patient Active Problem List   Diagnosis Date Noted  . Chest pain 02/23/2015  . Hypokalemia 02/23/2015  . AKI (acute kidney injury) 02/23/2015  . Diarrhea 02/23/2015  . Hypertension   . Coronary artery disease   . Thyroid disease   . Tobacco abuse   . Hypothyroidism   . HLD (hyperlipidemia)   . Gout   . GERD (gastroesophageal reflux disease)     PMHx:  Past Medical History  Diagnosis Date  . Hypertension   . Thyroid disease   . Coronary artery disease     s/p stent mid RCA 2003  . Tobacco abuse   . Hypothyroidism   . HLD (hyperlipidemia)   . Gout   . GERD (gastroesophageal reflux disease)    Past Surgical History  Procedure Laterality Date  . Abdominal hysterectomy    . Thyroid surgery      FAMHx: Family History  Problem Relation Age of Onset  . Hypertension Mother   . Kidney disease Mother   . Hypertension Brother   . Hypertension Sister     SOCHx:  reports that she has been smoking Cigarettes.  She has been smoking about 0.50 packs per day. She does not have any smokeless tobacco history on file. Her alcohol and drug histories are not on file.  ALLERGIES: Allergies  Allergen Reactions  . Ivp Dye [Iodinated Diagnostic Agents] Nausea And Vomiting  . Penicillins Rash    ROS: Pertinent items are noted in HPI.  HOME MEDICATIONS: Prescriptions prior to admission  Medication Sig  Dispense Refill Last Dose  . allopurinol (ZYLOPRIM) 100 MG tablet Take 100 mg by mouth daily.   02/23/2015 at Unknown time  . clopidogrel (PLAVIX) 75 MG tablet Take 75 mg by mouth daily.   02/23/2015 at Unknown time  . diclofenac sodium (VOLTAREN) 1 % GEL Apply 1 application topically 2 (two) times daily as needed (leg pain).   02/22/2015 at Unknown time  . Fluticasone-Salmeterol (ADVAIR) 250-50 MCG/DOSE AEPB Inhale 2 puffs into the lungs 2 (two) times daily as needed (wheezing/shortness of breath).   3 days ago  . HYDROcodone-acetaminophen (NORCO/VICODIN) 5-325 MG per tablet Take 1 tablet by mouth daily as needed (pain).    02/22/2015 at Unknown time  . isosorbide mononitrate (IMDUR) 30 MG 24 hr tablet Take 30 mg by mouth daily.   02/23/2015 at Unknown time  . levothyroxine (SYNTHROID, LEVOTHROID) 50 MCG tablet Take 50 mcg by mouth daily.   02/23/2015 at Unknown time  . lisinopril (PRINIVIL,ZESTRIL) 40 MG tablet Take 40 mg by mouth daily. For blood pressure   02/23/2015 at Unknown time  . lovastatin (MEVACOR) 40 MG tablet Take 40 mg by mouth at bedtime.   02/22/2015 at Unknown time  . metoprolol succinate (TOPROL-XL) 25 MG 24 hr tablet Take 25 mg by mouth daily.   02/23/2015 at [place]  . NIFEdipine (PROCARDIA-XL/ADALAT CC) 60 MG 24 hr tablet Take 60 mg  by mouth daily.   02/23/2015 at Unknown time  . pantoprazole (PROTONIX) 40 MG tablet Take 40 mg by mouth daily.   02/23/2015 at Unknown time  . PRESCRIPTION MEDICATION Inhale 1 puff into the lungs 2 (two) times daily as needed (shortness of breath/wheezing). Rescue inhaler   02/21/2015    HOSPITAL MEDICATIONS: I have reviewed the patient's current medications.  VITALS: Blood pressure 131/80, pulse 75, temperature 98.5 F (36.9 C), temperature source Oral, resp. rate 18, height 5' (1.524 m), weight 205 lb 9.6 oz (93.26 kg), SpO2 96 %.  INPUT/OUTPUT   Total I/O In: 735 [I.V.:735] Out: -     PHYSICAL EXAM: General appearance: alert and no  distress Neck: no adenopathy, no carotid bruit, no JVD, supple, symmetrical, trachea midline and thyroid not enlarged, symmetric, no tenderness/mass/nodules Lungs: clear to auscultation bilaterally Heart: regular rate and rhythm, S1, S2 normal, no murmur, click, rub or gallop Extremities: extremities normal, atraumatic, no cyanosis or edema  LABS:  BMP  Recent Labs  02/23/15 1231 02/24/15 0935  NA 138 143  K 3.3* 3.8  CL 104 110  CO2 23 24  GLUCOSE 171* 101*  BUN 9 7  CREATININE 1.45* 1.08*  CALCIUM 9.3 8.9  GFRNONAA 34* 49*  GFRAA 40* 57*    CBC  Recent Labs Lab 02/24/15 0935  WBC 3.1*  RBC 4.71  HGB 13.6  HCT 39.1  PLT 230  MCV 83.0    HEMOGLOBIN A1C No results found for: HGBA1C, MPG  Cardiac Panel (last 3 results)  Recent Labs  02/23/15 2205 02/24/15 0327 02/24/15 0935  TROPONINI <0.03 <0.03 <0.03    BNP (last 3 results) No results for input(s): PROBNP in the last 8760 hours.  TSH  Recent Labs  02/24/15 0338  TSH 2.704    CHOLESTEROL  Recent Labs  02/24/15 0327  CHOL 197    Hepatic Function Panel  Recent Labs  02/24/15 0935  PROT 6.5  ALBUMIN 3.4*  AST 21  ALT 13*  ALKPHOS 76  BILITOT 0.7   Tele- NSR  EKG- normal sinus rhythm at 91 with left anterior fascicular block, nonspecific IVCD, septal Q waves and lateral T-wave inversion. I personally reviewed this EKG  IMAGING: Dg Chest 2 View  02/23/2015   CLINICAL DATA:  Acute chest pain.  EXAM: CHEST  2 VIEW  COMPARISON:  August 14, 2014.  FINDINGS: The heart size and mediastinal contours are within normal limits. Both lungs are clear. No pneumothorax or pleural effusion is noted. The visualized skeletal structures are unremarkable.  IMPRESSION: No active cardiopulmonary disease.   Electronically Signed   By: Marijo Conception, M.D.   On: 02/23/2015 13:34   Ct Soft Tissue Neck Wo Contrast  02/24/2015   CLINICAL DATA:  Initial evaluation for acute swelling at the right  submandibular region.  EXAM: CT NECK WITHOUT CONTRAST  TECHNIQUE: Multidetector CT imaging of the neck was performed following the standard protocol without intravenous contrast.  COMPARISON:  None.  FINDINGS: Visualized portions of the brain demonstrate no acute abnormality.  Partially visualized paranasal sinuses and mastoid air cells are clear.  Parotid glands and left submandibular gland are normal. The right submandibular gland is slightly enlarged as compared to the left and mildly hyperdense. There is hazy inflammatory stranding within the adjacent right submandibular splays with thickening of the overlying platysmas. Findings suggestive of acute sialoadenitis. No obstructive radiopaque stone identified.  Visualized oral cavity within normal limits. Palatine tonsils symmetric and within normal limits. Parapharyngeal  fat preserved. Nasopharynx and oropharynx within normal limits. No retropharyngeal fluid collection. Epiglottis normal. Vallecular clear. Hypopharynx and supraglottic larynx demonstrated no acute abnormality. Apparent mild thickening of the right aryepiglottic fold likely related to motion artifact. True vocal cords grossly symmetric. Subglottic airway clear.  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 Left lobe of thyroid normal.  Visualized superior mediastinum within normal limits.  No adenopathy within the neck.  Partially visualized lungs are grossly clear.  No acute osseous abnormality. No worrisome lytic or blastic osseous lesions. Multilevel degenerative spondylolysis present within the visualized spine.  IMPRESSION: Slight asymmetric enlargement and hyperdensity of the right submandibular gland with associated mild inflammatory stranding within the adjacent right submandibular space. Findings are suggestive of acute sialoadenitis. No obstructive radiopaque calculus identified.   Electronically Signed   By: Jeannine Boga M.D.   On: 02/24/2015 06:59   US  Renal  02/24/2015   CLINICAL DATA:  Acute kidney injury.  EXAM: RENAL / URINARY TRACT ULTRASOUND COMPLETE  COMPARISON:  None.  FINDINGS: Right Kidney:  Length: 11.0 cm. Lobular renal contours. Echogenicity within normal limits. No mass or hydronephrosis visualized.  Left Kidney:  Length: 10.8 cm. Lobular renal contours. Echogenicity within normal limits. No mass or hydronephrosis visualized.  Bladder:  Appears normal for degree of bladder distention.  IMPRESSION: No obstructive uropathy or hydronephrosis. Lobular renal contours bilaterally.   Electronically Signed   By: Jeb Levering M.D.   On: 02/24/2015 00:19    IMPRESSION: 1. Dyspnea on exertion: Long history of tobacco abuse as well as ischemic heart disease. No evidence of CHF or volume overload. Labs are still pending. Suggest ischemia evaluation. 2. History of CAD: History of stenting in 2003 in [place]. Patient has had several episodes of chest pain which are somewhat atypical. 3. Hypertension: Controlled on nifedipine and metoprolol 4. Hyperlipidemia: On lovastatin   RECOMMENDATION: 1. We will order a 2-D echo for LV function and a pharmacologic Myoview stress test to rule out an ischemic etiology.  Time Spent Directly with Patient: 30 minutes  Quay Burow 02/24/2015, 10:36 AM

## 2015-02-24 NOTE — Discharge Summary (Signed)
[facility] PROGRESS NOTE  Assessment/Plan: Atypical Chest pain: - With multiple risk factors hyperlipidemia, peripheral vascular disease, obesity and age. Heart scores 5-6. - Chest x-ray does not show any acute cardio pulmonary disease, no evidence on telemetry, EKG showed flipped T waves in lateral leads and BNP was 48. 2 sets of cardiac markers were negative 2, her LDL is greater than 100. - She was continue on aspirin and Plavix. She relates she has had peripheral vascular disease on her lower extremities, but should mention to the admitting doctor that she coronary artery sees and had a stent that twice she is Plavix. She recently moved to [facility] to try to get records from her cardiologist over at [facility]. - Continue aspirin, Plavix and statins. - UDS positive for opiates and cannabis.  Hypokalemia: - Check a basic metabolic panel in the morning was repleted orally.  AKI (acute kidney injury) - No previous baseline creatinine to compare it with, a fractional secretion of sodium was less than 1%, she was started on IV fluid and his creatinine down 12.0. - KVO IV fluids. - Renal ultrasound showed no hydronephrosis no cortical thinning.  Diarrhea: - No further diarrhea DC C. difficile PCR checks.  Right Sialoadenitis: As appreciated by CT scan of the head and neck Apply warm compresses.  Essential Hypertension: - Her blood pressure seems to be stable continue current regimen.  Thyroid disease: Continue Synthroid.  Tobacco abuse: Counseling.    DVT ppx: SQ Heparin  Code Status: Full code Family Communication: Yes, patient's son at bed side Disposition Plan: Admit to inpatient   Consultants:  cardiology  Procedures:  CT head and neck  Antibiotics:  None  HPI/Subjective: She relates no further chest pain or shortness of breath. She relates that her legs usually swelled up in the afternoon.  Objective: Filed Vitals:   02/23/15 2000  02/23/15 2030 02/23/15 2107 02/24/15 0442  BP: 134/78 136/78 133/74 131/80  Pulse: 69 68 75 75  Temp:   98.5 F (36.9 C) 98.5 F (36.9 C)  TempSrc:   Oral Oral  Resp:  16 18 18   Height:   5' (1.524 m)   Weight:   96.616 kg (213 lb) 93.26 kg (205 lb 9.6 oz)  SpO2: 98% 98% 99% 96%   No intake or output data in the 24 hours ending 02/24/15 1001 Filed Weights   02/23/15 1220 02/23/15 2107 02/24/15 0442  Weight: 96.616 kg (213 lb) 96.616 kg (213 lb) 93.26 kg (205 lb 9.6 oz)    Exam:  General: Alert, awake, oriented x3, in no acute distress.  HEENT: No bruits, no goiter.  Heart: Regular rate and rhythm, no lower extremity edema Lungs: Good air movement, clear. Abdomen: Soft, nontender, nondistended, positive bowel sounds.  Neuro: Grossly intact, nonfocal.   Data Reviewed: Basic Metabolic Panel:  Recent Labs Lab 02/23/15 1231 02/24/15 0327  NA 138  --   K 3.3*  --   CL 104  --   CO2 23  --   GLUCOSE 171*  --   BUN 9  --   CREATININE 1.45*  --   CALCIUM 9.3  --   MG  --  2.0   Liver Function Tests: No results for input(s): AST, ALT, ALKPHOS, BILITOT, PROT, ALBUMIN in the last 168 hours. No results for input(s): LIPASE, AMYLASE in the last 168 hours. No results for input(s): AMMONIA in the last 168 hours. CBC:  Recent Labs Lab 02/23/15 1231 02/24/15 0935  WBC 3.9*  3.1*  HGB 14.1 13.6  HCT 40.6 39.1  MCV 83.2 83.0  PLT 252 230   Cardiac Enzymes:  Recent Labs Lab 02/23/15 2205 02/24/15 0327  TROPONINI <0.03 <0.03   BNP (last 3 results)  Recent Labs  02/23/15 1941  BNP 48.1    ProBNP (last 3 results) No results for input(s): PROBNP in the last 8760 hours.  CBG: No results for input(s): GLUCAP in the last 168 hours.  No results found for this or any previous visit (from the past 240 hour(s)).   Studies: Dg Chest 2 View  02/23/2015   CLINICAL DATA:  Acute chest pain.  EXAM: CHEST  2 VIEW  COMPARISON:  August 14, 2014.  FINDINGS: The heart  size and mediastinal contours are within normal limits. Both lungs are clear. No pneumothorax or pleural effusion is noted. The visualized skeletal structures are unremarkable.  IMPRESSION: No active cardiopulmonary disease.   Electronically Signed   By: Marijo Conception, M.D.   On: 02/23/2015 13:34   Ct Soft Tissue Neck Wo Contrast  02/24/2015   CLINICAL DATA:  Initial evaluation for acute swelling at the right submandibular region.  EXAM: CT NECK WITHOUT CONTRAST  TECHNIQUE: Multidetector CT imaging of the neck was performed following the standard protocol without intravenous contrast.  COMPARISON:  None.  FINDINGS: Visualized portions of the brain demonstrate no acute abnormality.  Partially visualized paranasal sinuses and mastoid air cells are clear.  Parotid glands and left submandibular gland are normal. The right submandibular gland is slightly enlarged as compared to the left and mildly hyperdense. There is hazy inflammatory stranding within the adjacent right submandibular splays with thickening of the overlying platysmas. Findings suggestive of acute sialoadenitis. No obstructive radiopaque stone identified.  Visualized oral cavity within normal limits. Palatine tonsils symmetric and within normal limits. Parapharyngeal fat preserved. Nasopharynx and oropharynx within normal limits. No retropharyngeal fluid collection. Epiglottis normal. Vallecular clear. Hypopharynx and supraglottic larynx demonstrated no acute abnormality. Apparent mild thickening of the right aryepiglottic fold likely related to motion artifact. True vocal cords grossly symmetric. Subglottic airway clear.  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 Left lobe of thyroid normal.  Visualized superior mediastinum within normal limits.  No adenopathy within the neck.  Partially visualized lungs are grossly clear.  No acute osseous abnormality. No worrisome lytic or blastic osseous lesions. Multilevel degenerative  spondylolysis present within the visualized spine.  IMPRESSION: Slight asymmetric enlargement and hyperdensity of the right submandibular gland with associated mild inflammatory stranding within the adjacent right submandibular space. Findings are suggestive of acute sialoadenitis. No obstructive radiopaque calculus identified.   Electronically Signed   By: Jeannine Boga M.D.   On: 02/24/2015 06:59   US Renal  02/24/2015   CLINICAL DATA:  Acute kidney injury.  EXAM: RENAL / URINARY TRACT ULTRASOUND COMPLETE  COMPARISON:  None.  FINDINGS: Right Kidney:  Length: 11.0 cm. Lobular renal contours. Echogenicity within normal limits. No mass or hydronephrosis visualized.  Left Kidney:  Length: 10.8 cm. Lobular renal contours. Echogenicity within normal limits. No mass or hydronephrosis visualized.  Bladder:  Appears normal for degree of bladder distention.  IMPRESSION: No obstructive uropathy or hydronephrosis. Lobular renal contours bilaterally.   Electronically Signed   By: Jeb Levering M.D.   On: 02/24/2015 00:19    Scheduled Meds: . allopurinol  100 mg Oral Daily  . aspirin  81 mg Oral Daily  . atorvastatin  40 mg Oral q1800  . clopidogrel  75 mg  Oral Daily  . heparin  5,000 Units Subcutaneous 3 times per day  . isosorbide mononitrate  30 mg Oral Daily  . levothyroxine  50 mcg Oral QAC breakfast  . metoprolol succinate  25 mg Oral Daily  . nicotine  21 mg Transdermal Daily  . NIFEdipine  60 mg Oral Daily  . pantoprazole  40 mg Oral Q1200  . sodium chloride  3 mL Intravenous Q12H   Continuous Infusions: . sodium chloride 75 mL/hr at 02/24/15 0012    Time Spent: 25 min   Charlynne Cousins  [facility] Pager [phone]. If 7PM-7AM, please contact night-coverage at [email or web address], password [facility] 02/24/2015, 10:01 AM  LOS: 1 day

## 2015-02-25 ENCOUNTER — Observation Stay (HOSPITAL_COMMUNITY): Payer: Commercial Managed Care - HMO

## 2015-02-25 ENCOUNTER — Encounter (HOSPITAL_COMMUNITY): Payer: Commercial Managed Care - HMO

## 2015-02-25 DIAGNOSIS — I10 Essential (primary) hypertension: Secondary | ICD-10-CM | POA: Diagnosis not present

## 2015-02-25 DIAGNOSIS — R197 Diarrhea, unspecified: Secondary | ICD-10-CM | POA: Diagnosis not present

## 2015-02-25 DIAGNOSIS — N179 Acute kidney failure, unspecified: Secondary | ICD-10-CM | POA: Diagnosis not present

## 2015-02-25 DIAGNOSIS — E876 Hypokalemia: Secondary | ICD-10-CM | POA: Diagnosis not present

## 2015-02-25 DIAGNOSIS — E669 Obesity, unspecified: Secondary | ICD-10-CM | POA: Diagnosis not present

## 2015-02-25 DIAGNOSIS — R079 Chest pain, unspecified: Secondary | ICD-10-CM

## 2015-02-25 DIAGNOSIS — E785 Hyperlipidemia, unspecified: Secondary | ICD-10-CM | POA: Diagnosis not present

## 2015-02-25 DIAGNOSIS — R0789 Other chest pain: Secondary | ICD-10-CM | POA: Diagnosis not present

## 2015-02-25 DIAGNOSIS — I739 Peripheral vascular disease, unspecified: Secondary | ICD-10-CM | POA: Diagnosis not present

## 2015-02-25 DIAGNOSIS — I2510 Atherosclerotic heart disease of native coronary artery without angina pectoris: Secondary | ICD-10-CM | POA: Diagnosis not present

## 2015-02-25 LAB — GLUCOSE, CAPILLARY: GLUCOSE-CAPILLARY: 99 mg/dL (ref 65–99)

## 2015-02-25 LAB — HEMOGLOBIN A1C
Hgb A1c MFr Bld: 6.3 % — ABNORMAL HIGH (ref 4.8–5.6)
Mean Plasma Glucose: 134 mg/dL

## 2015-02-25 MED ORDER — REGADENOSON 0.4 MG/5ML IV SOLN
INTRAVENOUS | Status: AC
  Administered 2015-02-25: 0.4 mg via INTRAVENOUS
  Filled 2015-02-25: qty 5

## 2015-02-25 MED ORDER — TECHNETIUM TC 99M SESTAMIBI GENERIC - CARDIOLITE
10.0000 | Freq: Once | INTRAVENOUS | Status: AC | PRN
  Administered 2015-02-25: 10 via INTRAVENOUS

## 2015-02-25 MED ORDER — TECHNETIUM TC 99M SESTAMIBI GENERIC - CARDIOLITE
30.0000 | Freq: Once | INTRAVENOUS | Status: AC | PRN
  Administered 2015-02-25: 30 via INTRAVENOUS

## 2015-02-25 NOTE — Discharge Summary (Signed)
Physician Discharge Summary  Joan Elliott [id number] DOB: 09-16-1940 DOA: 02/23/2015  PCP: No primary care provider on file.  Admit date: 02/23/2015 Discharge date: 02/25/2015  Time spent: 45 minutes  Recommendations for Outpatient Follow-up:  1. Dr.Nahser or associates with [facility] in 2-3 weeks  Discharge Diagnoses:  Principal Problem:   Chest pain Active Problems:   Hypertension   Coronary artery disease   Thyroid disease   Hypokalemia   AKI (acute kidney injury)   Tobacco abuse   Hypothyroidism   HLD (hyperlipidemia)   Gout   GERD (gastroesophageal reflux disease)   Diarrhea   Chest pain, rule out acute myocardial infarction   Discharge Condition: stable  Diet recommendation: heart healthy  Filed Weights   02/23/15 2107 02/24/15 0442 02/25/15 0408  Weight: 96.616 kg (213 lb) 93.26 kg (205 lb 9.6 oz) 93.5 kg (206 lb 2.1 oz)    History of present illness:  Chief Complaint: Chest pain, diarrhea and neck pain HPI: Joan Elliott is a 74 y.o. female with PMH of hypertension, GERD, hypothyroidism, gout, hyperlipidemia, CAD (s/p of stent 2003), who presented with chest pain, neck pain and diarrhea. Pt reported that in the past 3-4 days, she had been having intermittent chest pain. The chest pain is located in the substernal area, mild, nonradiating. It is not aggravated or alleviated of any known factors. It is not pleuritic. No tenderness over the calf area. She also reported that in the past 4 days, she had been having swelling and tenderness over right submandibular area in the neck. She stated the swelling was improving, had a sore throat and increased mucus in her throat for the last several days, which have resolved. She also has mild diarrhea in the past 2 weeks. She stated that she has loose stool every morning, without recent antibiotics use.    Hospital Course:  Atypical Chest pain: - With multiple risk factors hyperlipidemia, peripheral vascular  disease, obesity and age. Heart score was 5-6. - Chest x-ray does not show any acute cardio pulmonary disease, EKg without acute changes, Troponin x3 negative -seen by Cardiology in consultation, and  underwent a Myoview/stress test today which was negative for inducible ischemia. - She was continued on aspirin and Plavix. She relates she has had peripheral vascular disease on her lower extremities, and h/o CAD/PCI and stenting in 2003 in [place]. - clinically improved and no further chest pain, stable for discharge -Follow-up with cardiology recommended  Hypokalemia: - repleted  AKI (acute kidney injury) - No previous baseline creatinine to compare it with, a fractional secretion of sodium was less than 1%, she was started on IV fluids, ACE stopped,  Improved to creatinine of 1.0 at discharge  Diarrhea: recent h/o - No further diarrhea , resolved  Right Sialoadenitis: As appreciated by CT scan of the head and neck -supportive care, warm compresses.  Essential Hypertension: - Her blood pressure seems to be stable continue current regimen.  Thyroid disease: -Continue Synthroid.  Tobacco abuse: Counseling.   Procedures:  Myoview  Consultations:  Cards  Discharge Exam: Filed Vitals:   02/25/15 1423  BP: 154/95  Pulse: 85  Temp: 98.2 F (36.8 C)  Resp: 18    General: AAOx3 Cardiovascular: S1S2/RRR Respiratory: CTAB  Discharge Instructions   Discharge Instructions    Diet - low sodium heart healthy    Complete by:  As directed      Increase activity slowly    Complete by:  As directed  Current Discharge Medication List    CONTINUE these medications which have NOT CHANGED   Details  albuterol (PROVENTIL HFA;VENTOLIN HFA) 108 (90 BASE) MCG/ACT inhaler Inhale 2 puffs into the lungs every 6 (six) hours as needed for wheezing or shortness of breath.    allopurinol (ZYLOPRIM) 100 MG tablet Take 100 mg by mouth daily.    clopidogrel (PLAVIX) 75 MG tablet  Take 75 mg by mouth daily.    diclofenac sodium (VOLTAREN) 1 % GEL Apply 1 application topically 2 (two) times daily as needed (leg pain).    Fluticasone-Salmeterol (ADVAIR) 250-50 MCG/DOSE AEPB Inhale 2 puffs into the lungs 2 (two) times daily as needed (wheezing/shortness of breath).    HYDROcodone-acetaminophen (NORCO/VICODIN) 5-325 MG per tablet Take 1 tablet by mouth daily as needed (pain).     isosorbide mononitrate (IMDUR) 30 MG 24 hr tablet Take 30 mg by mouth daily.    levothyroxine (SYNTHROID, LEVOTHROID) 50 MCG tablet Take 50 mcg by mouth daily.    lovastatin (MEVACOR) 40 MG tablet Take 40 mg by mouth at bedtime.    metoprolol succinate (TOPROL-XL) 25 MG 24 hr tablet Take 25 mg by mouth daily.    NIFEdipine (PROCARDIA-XL/ADALAT CC) 60 MG 24 hr tablet Take 60 mg by mouth daily.    pantoprazole (PROTONIX) 40 MG tablet Take 40 mg by mouth daily.      STOP taking these medications     lisinopril (PRINIVIL,ZESTRIL) 40 MG tablet        Allergies  Allergen Reactions  . Ivp Dye [Iodinated Diagnostic Agents] Nausea And Vomiting  . Penicillins Rash   Follow-up Information    Follow up with [facility].   Why:  Rolling Walker arranged- to be delivered to room prior to discharge   Contact information:   [address] [place] [address] [phone]       Follow up with PCP . Schedule an appointment as soon as possible for a visit in 1 week.       The results of significant diagnostics from this hospitalization (including imaging, microbiology, ancillary and laboratory) are listed below for reference.    Significant Diagnostic Studies: Dg Chest 2 View  02/23/2015   CLINICAL DATA:  Acute chest pain.  EXAM: CHEST  2 VIEW  COMPARISON:  August 14, 2014.  FINDINGS: The heart size and mediastinal contours are within normal limits. Both lungs are clear. No pneumothorax or pleural effusion is noted. The visualized skeletal structures are  unremarkable.  IMPRESSION: No active cardiopulmonary disease.   Electronically Signed   By: Marijo Conception, M.D.   On: 02/23/2015 13:34   Ct Soft Tissue Neck Wo Contrast  02/24/2015   CLINICAL DATA:  Initial evaluation for acute swelling at the right submandibular region.  EXAM: CT NECK WITHOUT CONTRAST  TECHNIQUE: Multidetector CT imaging of the neck was performed following the standard protocol without intravenous contrast.  COMPARISON:  None.  FINDINGS: Visualized portions of the brain demonstrate no acute abnormality.  Partially visualized paranasal sinuses and mastoid air cells are clear.  Parotid glands and left submandibular gland are normal. The right submandibular gland is slightly enlarged as compared to the left and mildly hyperdense. There is hazy inflammatory stranding within the adjacent right submandibular splays with thickening of the overlying platysmas. Findings suggestive of acute sialoadenitis. No obstructive radiopaque stone identified.  Visualized oral cavity within normal limits. Palatine tonsils symmetric and within normal limits. Parapharyngeal fat preserved. Nasopharynx and oropharynx within  normal limits. No retropharyngeal fluid collection. Epiglottis normal. Vallecular clear. Hypopharynx and supraglottic larynx demonstrated no acute abnormality. Apparent mild thickening of the right aryepiglottic fold likely related to motion artifact. True vocal cords grossly symmetric. Subglottic airway clear.  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 Left lobe of thyroid normal.  Visualized superior mediastinum within normal limits.  No adenopathy within the neck.  Partially visualized lungs are grossly clear.  No acute osseous abnormality. No worrisome lytic or blastic osseous lesions. Multilevel degenerative spondylolysis present within the visualized spine.  IMPRESSION: Slight asymmetric enlargement and hyperdensity of the right submandibular gland with associated mild  inflammatory stranding within the adjacent right submandibular space. Findings are suggestive of acute sialoadenitis. No obstructive radiopaque calculus identified.   Electronically Signed   By: Jeannine Boga M.D.   On: 02/24/2015 06:59   US Renal  02/24/2015   CLINICAL DATA:  Acute kidney injury.  EXAM: RENAL / URINARY TRACT ULTRASOUND COMPLETE  COMPARISON:  None.  FINDINGS: Right Kidney:  Length: 11.0 cm. Lobular renal contours. Echogenicity within normal limits. No mass or hydronephrosis visualized.  Left Kidney:  Length: 10.8 cm. Lobular renal contours. Echogenicity within normal limits. No mass or hydronephrosis visualized.  Bladder:  Appears normal for degree of bladder distention.  IMPRESSION: No obstructive uropathy or hydronephrosis. Lobular renal contours bilaterally.   Electronically Signed   By: Jeb Levering M.D.   On: 02/24/2015 00:19   Nm Myocar Multi W/spect W/wall Motion / Ef  02/25/2015   CLINICAL DATA:  Chest pain for 1 week. History of coronary artery disease.  EXAM: MYOCARDIAL IMAGING WITH SPECT (REST AND PHARMACOLOGIC-STRESS)  GATED LEFT VENTRICULAR WALL MOTION STUDY  LEFT VENTRICULAR EJECTION FRACTION  TECHNIQUE: Standard myocardial SPECT imaging was performed after resting intravenous injection of 10 mCi Tc-54m sestamibi. Subsequently, intravenous infusion of Lexiscan was performed under the supervision of the Cardiology staff. At peak effect of the drug, 30 mCi Tc-57m sestamibi was injected intravenously and standard myocardial SPECT imaging was performed. Quantitative gated imaging was also performed to evaluate left ventricular wall motion, and estimate left ventricular ejection fraction.  COMPARISON:  Chest radiograph 02/23/2015  FINDINGS: Perfusion: No decreased activity in the left ventricle on stress imaging to suggest reversible ischemia or infarction.  Wall Motion: Normal left ventricular wall motion. No left ventricular dilation.  Left Ventricular Ejection Fraction:  62 %  End diastolic volume 72 ml  End systolic volume 28 ml  IMPRESSION: 1. No reversible ischemia or infarction.  2. Normal left ventricular wall motion.  3. Left ventricular ejection fraction is 62%.  4. Low-risk stress test findings*.  *2012 Appropriate Use Criteria for Coronary Revascularization Focused Update: J Am Coll Cardiol. N6492421. http://content.airportbarriers.com.aspx?articleid=1201161   Electronically Signed   By: Markus Daft M.D.   On: 02/25/2015 11:52    Microbiology: No results found for this or any previous visit (from the past 240 hour(s)).   Labs: Basic Metabolic Panel:  Recent Labs Lab 02/23/15 1231 02/24/15 0327 02/24/15 0935  NA 138  --  143  K 3.3*  --  3.8  CL 104  --  110  CO2 23  --  24  GLUCOSE 171*  --  101*  BUN 9  --  7  CREATININE 1.45*  --  1.08*  CALCIUM 9.3  --  8.9  MG  --  2.0  --    Liver Function Tests:  Recent Labs Lab 02/24/15 0935  AST 21  ALT 13*  ALKPHOS 76  BILITOT 0.7  PROT 6.5  ALBUMIN 3.4*   No results for input(s): LIPASE, AMYLASE in the last 168 hours. No results for input(s): AMMONIA in the last 168 hours. CBC:  Recent Labs Lab 02/23/15 1231 02/24/15 0935  WBC 3.9* 3.1*  HGB 14.1 13.6  HCT 40.6 39.1  MCV 83.2 83.0  PLT 252 230   Cardiac Enzymes:  Recent Labs Lab 02/23/15 2205 02/24/15 0327 02/24/15 0935  TROPONINI <0.03 <0.03 <0.03   BNP: BNP (last 3 results)  Recent Labs  02/23/15 1941  BNP 48.1    ProBNP (last 3 results) No results for input(s): PROBNP in the last 8760 hours.  CBG:  Recent Labs Lab 02/25/15 0607  GLUCAP 99       Signed:  Joffre Lucks  [facility] 02/25/2015, 2:40 PM

## 2015-02-25 NOTE — Progress Notes (Signed)
Subjective: No further chest tightness.  Objective: Vital signs in last 24 hours: Temp:  [97.7 F (36.5 C)-98.7 F (37.1 C)] 97.9 F (36.6 C) (08/24 0408) Pulse Rate:  [71-78] 71 (08/24 0408) Resp:  [17-18] 18 (08/24 0408) BP: (134-135)/(66-79) 134/77 mmHg (08/24 0408) SpO2:  [99 %-100 %] 99 % (08/24 0408) Weight:  [206 lb 2.1 oz (93.5 kg)] 206 lb 2.1 oz (93.5 kg) (08/24 0408) Last BM Date: 02/24/15  Intake/Output from previous day: 08/23 0701 - 08/24 0700 In: 1335 [P.O.:600; I.V.:735] Out: 2350 [Urine:2350] Intake/Output this shift: Total I/O In: -  Out: 500 [Urine:500]  Medications Scheduled Meds: . allopurinol  100 mg Oral Daily  . aspirin  81 mg Oral Daily  . atorvastatin  40 mg Oral q1800  . clopidogrel  75 mg Oral Daily  . heparin  5,000 Units Subcutaneous 3 times per day  . isosorbide mononitrate  30 mg Oral Daily  . levothyroxine  50 mcg Oral QAC breakfast  . metoprolol succinate  25 mg Oral Daily  . nicotine  21 mg Transdermal Daily  . NIFEdipine  60 mg Oral Daily  . pantoprazole  40 mg Oral Q1200  . regadenoson      . regadenoson  0.4 mg Intravenous Once  . sodium chloride  3 mL Intravenous Q12H   Continuous Infusions: . sodium chloride 10 mL/hr at 02/24/15 1130   PRN Meds:.acetaminophen **OR** acetaminophen, alum & mag hydroxide-simeth, hydrALAZINE, hydrOXYzine, morphine injection, nitroGLYCERIN, oxyCODONE-acetaminophen  PE: General appearance: alert, cooperative and no distress Lungs: clear to auscultation bilaterally Heart: regular rate and rhythm, S1, S2 normal, no murmur, click, rub or gallop Extremities: No LEE Pulses: 2+ and symmetric Skin: warm and dry Neurologic: Grossly normal  Lab Results:   Recent Labs  02/23/15 1231 02/24/15 0935  WBC 3.9* 3.1*  HGB 14.1 13.6  HCT 40.6 39.1  PLT 252 230   BMET  Recent Labs  02/23/15 1231 02/24/15 0935  NA 138 143  K 3.3* 3.8  CL 104 110  CO2 23 24  GLUCOSE 171* 101*  BUN 9 7    CREATININE 1.45* 1.08*  CALCIUM 9.3 8.9   PT/INR  Recent Labs  02/23/15 1941 02/24/15 0327  LABPROT 13.5 14.5  INR 1.01 1.11   Cholesterol  Recent Labs  02/24/15 0327  CHOL 197   Lipid Panel     Component Value Date/Time   CHOL 197 02/24/2015 0327   TRIG 105 02/24/2015 0327   HDL 58 02/24/2015 0327   CHOLHDL 3.4 02/24/2015 0327   VLDL 21 02/24/2015 0327   LDLCALC 118* 02/24/2015 0327    Cardiac Panel (last 3 results)  Recent Labs  02/23/15 2205 02/24/15 0327 02/24/15 0935  TROPONINI <0.03 <0.03 <0.03      Assessment/Plan   Principal Problem:   Chest pain Active Problems:   Hypertension   Coronary artery disease   Thyroid disease   Hypokalemia   AKI (acute kidney injury)   Tobacco abuse   Hypothyroidism   HLD (hyperlipidemia)   Gout   GERD (gastroesophageal reflux disease)   Diarrhea  No further chest tightness.  Ruled out for MI.  EF 50-55% with G1DD on new echo.  She tolerated the Lexiscan fairly well.  BP stable.   LOS: 2 days    HAGER, BRYAN PA-C 02/25/2015 9:23 AM  Attending Note:   The patient was seen and examined.  Agree with assessment and plan as noted above.  Changes made to the above note  as needed.  CP has resolved. Troponin levels are negative myoview is negative for ischemia or infarct.  She may be DC to home Follow up with medical doctor    Thayer Headings, Brooke Bonito., MD, [facility] 02/25/2015, 2:12 PM [id number] N. [address],  [place] Pager [phone]

## 2015-02-25 NOTE — Progress Notes (Addendum)
Occupational Therapy Treatment Patient Details Name: Joan Elliott MRN: [id number] DOB: Jan 31, 1941 Today's Date: 02/25/2015    History of present illness 74 y.o. female with PMH of hypertension, GERD, hypothyroidism, gout, hyperlipidemia, CAD (s/p of stent 2003), who presented with chest pain, neck pain and diarrhea.   OT comments  Education provided in session. Pt planning to d/c home today.  Follow Up Recommendations  No OT follow up;Supervision - Intermittent    Equipment Recommendations  None recommended by OT    Recommendations for Other Services      Precautions / Restrictions Precautions Precautions: Fall Restrictions Weight Bearing Restrictions: No       Mobility Bed Mobility               General bed mobility comments: Pt sitting on EOB  Transfers Overall transfer level: Needs assistance Equipment used: Rolling walker (2 wheeled);None Transfers: Sit to/from Stand Sit to Stand: Supervision;Modified independent (Device/Increase time)         General transfer comment: cue for hand placement.    Balance    No LOB in session.                                ADL Overall ADL's : Needs assistance/impaired                                 Tub/ Shower Transfer: Tub transfer;Ambulation;Supervision-Min guard   Functional mobility during ADLs: Supervision/safety;Rolling walker (ambulated with and without RW); Supervision-Min guard for simulated tub transfer General ADL Comments: Educated on energy conservation and handout given. Educated on tub transfer techniques and pt practiced stepping over simulated tub. Pt notified OT that she had tub bench so OT educated on how this is positioned in her tub and tub bench transfer technique and recommended her using this technique at home. Educated on safety such as safe footwear, use of bag on walker, and rugs/items on floor.  Recommended pt not setup her tub bench in her tub, but to have  someone else do this for her.       Vision                     Perception     Praxis      Cognition  Awake/Alert Behavior During Therapy: WFL for tasks assessed/performed Overall Cognitive Status: Within Functional Limits for tasks assessed                       Extremity/Trunk Assessment               Exercises     Shoulder Instructions       General Comments      Pertinent Vitals/ Pain       Pain Assessment: 0-10 Pain Score:  (8-head and 6-left foot) Pain Location: left foot and head Pain Descriptors / Indicators: Sore;Headache Pain Intervention(s): Monitored during session (nurse notified of headache)  Home Living                                          Prior Functioning/Environment              Frequency Min 2X/week     Progress Toward Goals  OT Goals(current goals  can now be found in the care plan section)  Progress towards OT goals: Progressing toward goals  Acute Rehab OT Goals Patient Stated Goal: she wanted something to eat OT Goal Formulation: With patient Time For Goal Achievement: 03/03/15 Potential to Achieve Goals: Good ADL Goals Pt Will Perform Tub/Shower Transfer: Tub transfer;with modified independence;ambulating;shower seat Additional ADL Goal #1: Pt will independently verbalize 3/3 energy conservation techniques and utilize at least 2 during session.  Plan Discharge plan remains appropriate    Co-evaluation                 End of Session Equipment Utilized During Treatment: Gait belt;Rolling walker   Activity Tolerance Patient tolerated treatment well   Patient Left in bed;with call bell/phone within reach   Nurse Communication Other (comment) (pt has headache and has not gotten her food; IV beeping)        Time: [id number] OT Time Calculation (min): 16 min  Charges: OT General Charges $OT Visit: 1 Procedure OT Treatments $Self Care/Home Management : 8-22 mins  Benito Mccreedy OTR/L [id number]  02/25/2015, 3:06 PM

## 2015-02-27 DIAGNOSIS — M542 Cervicalgia: Secondary | ICD-10-CM | POA: Diagnosis not present

## 2015-03-03 DIAGNOSIS — M542 Cervicalgia: Secondary | ICD-10-CM | POA: Diagnosis not present

## 2015-03-05 DIAGNOSIS — M542 Cervicalgia: Secondary | ICD-10-CM | POA: Diagnosis not present

## 2015-03-17 DIAGNOSIS — M542 Cervicalgia: Secondary | ICD-10-CM | POA: Diagnosis not present

## 2015-03-19 DIAGNOSIS — M542 Cervicalgia: Secondary | ICD-10-CM | POA: Diagnosis not present

## 2015-03-24 DIAGNOSIS — M542 Cervicalgia: Secondary | ICD-10-CM | POA: Diagnosis not present

## 2015-03-26 DIAGNOSIS — M542 Cervicalgia: Secondary | ICD-10-CM | POA: Diagnosis not present

## 2015-03-27 DIAGNOSIS — Z23 Encounter for immunization: Secondary | ICD-10-CM | POA: Diagnosis not present

## 2015-03-27 DIAGNOSIS — I129 Hypertensive chronic kidney disease with stage 1 through stage 4 chronic kidney disease, or unspecified chronic kidney disease: Secondary | ICD-10-CM | POA: Diagnosis not present

## 2015-03-27 DIAGNOSIS — E039 Hypothyroidism, unspecified: Secondary | ICD-10-CM | POA: Diagnosis not present

## 2015-03-27 DIAGNOSIS — N189 Chronic kidney disease, unspecified: Secondary | ICD-10-CM | POA: Diagnosis not present

## 2015-03-27 DIAGNOSIS — I10 Essential (primary) hypertension: Secondary | ICD-10-CM | POA: Diagnosis not present

## 2015-03-27 DIAGNOSIS — E874 Mixed disorder of acid-base balance: Secondary | ICD-10-CM | POA: Diagnosis not present

## 2015-03-27 DIAGNOSIS — G4730 Sleep apnea, unspecified: Secondary | ICD-10-CM | POA: Diagnosis not present

## 2015-03-27 DIAGNOSIS — I119 Hypertensive heart disease without heart failure: Secondary | ICD-10-CM | POA: Diagnosis not present

## 2015-03-27 DIAGNOSIS — M130 Polyarthritis, unspecified: Secondary | ICD-10-CM | POA: Diagnosis not present

## 2015-03-29 ENCOUNTER — Emergency Department (HOSPITAL_COMMUNITY)
Admission: EM | Admit: 2015-03-29 | Discharge: 2015-03-29 | Disposition: A | Discharge status: Home / Self Care | Payer: Commercial Managed Care - HMO | Attending: Emergency Medicine | Admitting: Emergency Medicine

## 2015-03-29 ENCOUNTER — Encounter (HOSPITAL_COMMUNITY): Payer: Self-pay

## 2015-03-29 ENCOUNTER — Emergency Department (HOSPITAL_COMMUNITY): Payer: Commercial Managed Care - HMO

## 2015-03-29 DIAGNOSIS — R220 Localized swelling, mass and lump, head: Secondary | ICD-10-CM | POA: Diagnosis not present

## 2015-03-29 DIAGNOSIS — Z7902 Long term (current) use of antithrombotics/antiplatelets: Secondary | ICD-10-CM | POA: Diagnosis not present

## 2015-03-29 DIAGNOSIS — E785 Hyperlipidemia, unspecified: Secondary | ICD-10-CM | POA: Insufficient documentation

## 2015-03-29 DIAGNOSIS — Z880 Allergy status to penicillin: Secondary | ICD-10-CM | POA: Diagnosis not present

## 2015-03-29 DIAGNOSIS — E039 Hypothyroidism, unspecified: Secondary | ICD-10-CM | POA: Insufficient documentation

## 2015-03-29 DIAGNOSIS — M109 Gout, unspecified: Secondary | ICD-10-CM | POA: Diagnosis not present

## 2015-03-29 DIAGNOSIS — R252 Cramp and spasm: Secondary | ICD-10-CM | POA: Diagnosis not present

## 2015-03-29 DIAGNOSIS — I10 Essential (primary) hypertension: Secondary | ICD-10-CM | POA: Diagnosis not present

## 2015-03-29 DIAGNOSIS — I2510 Atherosclerotic heart disease of native coronary artery without angina pectoris: Secondary | ICD-10-CM | POA: Insufficient documentation

## 2015-03-29 DIAGNOSIS — J029 Acute pharyngitis, unspecified: Secondary | ICD-10-CM | POA: Diagnosis not present

## 2015-03-29 DIAGNOSIS — Z79899 Other long term (current) drug therapy: Secondary | ICD-10-CM | POA: Diagnosis not present

## 2015-03-29 DIAGNOSIS — K219 Gastro-esophageal reflux disease without esophagitis: Secondary | ICD-10-CM | POA: Diagnosis not present

## 2015-03-29 DIAGNOSIS — Z720 Tobacco use: Secondary | ICD-10-CM | POA: Insufficient documentation

## 2015-03-29 DIAGNOSIS — R51 Headache: Secondary | ICD-10-CM | POA: Diagnosis not present

## 2015-03-29 LAB — CBC WITH DIFFERENTIAL/PLATELET
Basophils Absolute: 0 10*3/uL (ref 0.0–0.1)
Basophils Relative: 1 %
Eosinophils Absolute: 0.1 10*3/uL (ref 0.0–0.7)
Eosinophils Relative: 2 %
HEMATOCRIT: 37.8 % (ref 36.0–46.0)
HEMOGLOBIN: 13.1 g/dL (ref 12.0–15.0)
LYMPHS ABS: 1.3 10*3/uL (ref 0.7–4.0)
Lymphocytes Relative: 22 %
MCH: 28.9 pg (ref 26.0–34.0)
MCHC: 34.7 g/dL (ref 30.0–36.0)
MCV: 83.4 fL (ref 78.0–100.0)
MONOS PCT: 9 %
Monocytes Absolute: 0.5 10*3/uL (ref 0.1–1.0)
NEUTROS ABS: 4 10*3/uL (ref 1.7–7.7)
NEUTROS PCT: 66 %
Platelets: 209 10*3/uL (ref 150–400)
RBC: 4.53 MIL/uL (ref 3.87–5.11)
RDW: 14.5 % (ref 11.5–15.5)
WBC: 5.9 10*3/uL (ref 4.0–10.5)

## 2015-03-29 LAB — BASIC METABOLIC PANEL
ANION GAP: 10 (ref 5–15)
BUN: 11 mg/dL (ref 6–20)
CHLORIDE: 105 mmol/L (ref 101–111)
CO2: 23 mmol/L (ref 22–32)
Calcium: 9 mg/dL (ref 8.9–10.3)
Creatinine, Ser: 1.13 mg/dL — ABNORMAL HIGH (ref 0.44–1.00)
GFR calc non Af Amer: 46 mL/min — ABNORMAL LOW (ref >60)
GFR, EST AFRICAN AMERICAN: 53 mL/min — AB (ref >60)
Glucose, Bld: 117 mg/dL — ABNORMAL HIGH (ref 65–99)
POTASSIUM: 3.8 mmol/L (ref 3.5–5.1)
Sodium: 138 mmol/L (ref 135–145)

## 2015-03-29 MED ORDER — METHYLPREDNISOLONE SODIUM SUCC 125 MG IJ SOLR
125.0000 mg | Freq: Once | INTRAMUSCULAR | Status: AC
  Administered 2015-03-29: 125 mg via INTRAVENOUS
  Filled 2015-03-29: qty 2

## 2015-03-29 MED ORDER — DIPHENHYDRAMINE HCL 50 MG/ML IJ SOLN
50.0000 mg | Freq: Once | INTRAMUSCULAR | Status: AC
  Administered 2015-03-29: 50 mg via INTRAVENOUS
  Filled 2015-03-29: qty 1

## 2015-03-29 MED ORDER — MORPHINE SULFATE (PF) 4 MG/ML IV SOLN
4.0000 mg | Freq: Once | INTRAVENOUS | Status: AC
  Administered 2015-03-29: 4 mg via INTRAVENOUS
  Filled 2015-03-29: qty 1

## 2015-03-29 MED ORDER — SODIUM CHLORIDE 0.9 % IV BOLUS (SEPSIS)
1000.0000 mL | Freq: Once | INTRAVENOUS | Status: AC
  Administered 2015-03-29: 1000 mL via INTRAVENOUS

## 2015-03-29 MED ORDER — CLINDAMYCIN HCL 300 MG PO CAPS: 300.0000 mg | ORAL_CAPSULE | Freq: Four times a day (QID) | ORAL | Status: DC

## 2015-03-29 MED ORDER — IOHEXOL 300 MG/ML  SOLN
75.0000 mL | Freq: Once | INTRAMUSCULAR | Status: AC | PRN
  Administered 2015-03-29: 75 mL via INTRAVENOUS

## 2015-03-29 MED ORDER — FENTANYL CITRATE (PF) 100 MCG/2ML IJ SOLN
50.0000 ug | Freq: Once | INTRAMUSCULAR | Status: AC
  Administered 2015-03-29: 50 ug via INTRAVENOUS
  Filled 2015-03-29: qty 2

## 2015-03-29 MED ORDER — HYDROCODONE-ACETAMINOPHEN 7.5-325 MG/15ML PO SOLN: 10.0000 mL | Freq: Four times a day (QID) | ORAL | Status: DC | PRN

## 2015-03-29 NOTE — ED Notes (Signed)
Pt. Presents with complaint of L sided facial swelling near ear, HA, and sore throat. Pt. Saw PCP on Friday, given azythromycin. No improvement in pain 10/10.

## 2015-03-29 NOTE — ED Notes (Signed)
Called CT to advise pre-meds given.

## 2015-03-29 NOTE — ED Provider Notes (Signed)
CSN: [id number]     Arrival date & time 03/29/15  0945 History   First MD Initiated Contact with Patient 03/29/15 [phone]     Chief Complaint  Patient presents with  . Facial Swelling  . Sore Throat     (Consider location/radiation/quality/duration/timing/severity/associated sxs/prior Treatment) The history is provided by the patient.     Patient presents with left facial pain and swelling, difficulty raising and lowering jaw, sore throat x 3 days.  Intermittent headache.  Was started on z-pak 2 days ago by PCP, as taken two doses.  States her pain is uncontrolled, taking oxycontin chronically at home.  Denies cough, CP, SOB, abdominal pain, ear pain, ear discharge.    Past Medical History  Diagnosis Date  . Hypertension   . Thyroid disease   . Coronary artery disease     s/p stent mid RCA 2003  . Tobacco abuse   . Hypothyroidism   . HLD (hyperlipidemia)   . Gout   . GERD (gastroesophageal reflux disease)    Past Surgical History  Procedure Laterality Date  . Abdominal hysterectomy    . Thyroid surgery     Family History  Problem Relation Age of Onset  . Hypertension Mother   . Kidney disease Mother   . Hypertension Brother   . Hypertension Sister    Social History  Substance Use Topics  . Smoking status: Current Some Day Smoker -- 0.50 packs/day    Types: Cigarettes  . Smokeless tobacco: None  . Alcohol Use: No   OB History    No data available     Review of Systems  All other systems reviewed and are negative.     Allergies  Ivp dye and Penicillins  Home Medications   Prior to Admission medications   Medication Sig Start Date End Date Taking? Authorizing Provider  albuterol (PROVENTIL HFA;VENTOLIN HFA) 108 (90 BASE) MCG/ACT inhaler Inhale 2 puffs into the lungs every 6 (six) hours as needed for wheezing or shortness of breath.    Historical Provider, MD  allopurinol (ZYLOPRIM) 100 MG tablet Take 100 mg by mouth daily. 02/02/15   Historical Provider, MD    clopidogrel (PLAVIX) 75 MG tablet Take 75 mg by mouth daily. 02/02/15   Historical Provider, MD  diclofenac sodium (VOLTAREN) 1 % GEL Apply 1 application topically 2 (two) times daily as needed (leg pain).    Historical Provider, MD  Fluticasone-Salmeterol (ADVAIR) 250-50 MCG/DOSE AEPB Inhale 2 puffs into the lungs 2 (two) times daily as needed (wheezing/shortness of breath).    Historical Provider, MD  HYDROcodone-acetaminophen (NORCO/VICODIN) 5-325 MG per tablet Take 1 tablet by mouth daily as needed (pain).  01/02/15   Historical Provider, MD  isosorbide mononitrate (IMDUR) 30 MG 24 hr tablet Take 30 mg by mouth daily. 02/02/15   Historical Provider, MD  levothyroxine (SYNTHROID, LEVOTHROID) 50 MCG tablet Take 50 mcg by mouth daily. 02/02/15   Historical Provider, MD  lovastatin (MEVACOR) 40 MG tablet Take 40 mg by mouth at bedtime. 12/24/14   Historical Provider, MD  metoprolol succinate (TOPROL-XL) 25 MG 24 hr tablet Take 25 mg by mouth daily. 02/02/15   Historical Provider, MD  NIFEdipine (PROCARDIA-XL/ADALAT CC) 60 MG 24 hr tablet Take 60 mg by mouth daily. 02/02/15   Historical Provider, MD  pantoprazole (PROTONIX) 40 MG tablet Take 40 mg by mouth daily. 02/02/15   Historical Provider, MD   BP 172/94 mmHg  Pulse 98  Temp(Src) 99.1 F (37.3 C) (Oral)  Resp 20  Ht 5' (1.524 m)  Wt 208 lb (94.348 kg)  BMI 40.62 kg/m2  SpO2 98% Physical Exam  Constitutional: She appears well-developed and well-nourished. No distress.  HENT:  Head: Normocephalic and atraumatic.    Mouth/Throat: Uvula is midline. There is trismus in the jaw. No uvula swelling. Posterior oropharyngeal erythema present. No oropharyngeal exudate.  Eyes: Conjunctivae are normal.  Neck: Neck supple.  Cardiovascular: Normal rate and regular rhythm.   Pulmonary/Chest: Effort normal and breath sounds normal. No respiratory distress. She has no wheezes. She has no rales.  Neurological: She is alert.  Skin: She is not diaphoretic.   Nursing note and vitals reviewed.   ED Course  Procedures (including critical care time) Labs Review Labs Reviewed  BASIC METABOLIC PANEL - Abnormal; Notable for the following:    Glucose, Bld 117 (*)    Creatinine, Ser 1.13 (*)    GFR calc non Af Amer 46 (*)    GFR calc Af Amer 53 (*)    All other components within normal limits  CBC WITH DIFFERENTIAL/PLATELET    Imaging Review Ct Soft Tissue Neck W Contrast  03/29/2015   CLINICAL DATA:  Left facial swelling with headaches and sore throat  EXAM: CT NECK WITH CONTRAST  TECHNIQUE: Multidetector CT imaging of the neck was performed using the standard protocol following the bolus administration of intravenous contrast.  CONTRAST:  95mL OMNIPAQUE IOHEXOL 300 MG/ML  SOLN  COMPARISON:  Noncontrast study from 02/23/2015  FINDINGS: Pharynx and larynx: A small 1 cm retention cyst is noted within the left thyroid best seen on image number 46 of series 2. A few scattered calcifications are noted likely representing small tonsils stones. No focal mass lesion is noted. Motion artifact is noted in the region of the hypopharynx although no gross abnormality is noted.  Salivary glands: The parotid and submandibular glands are well visualized. The previously seen stranding surrounding the right mandibular gland has resolved in the interval. No significant inflammatory changes are seen.  Thyroid: There are changes consistent with partial thyroidectomy on the right. The residual left thyroid gland is within normal limits.  Lymph nodes: No significant lymphadenopathy is identified in the neck. Scattered small reactive nodes are seen bilaterally but not significant by size criteria.  Vascular: Scattered vascular calcifications are noted within the carotid system bilaterally. Mild narrowing of approximately 50% is noted in the left internal carotid artery at its origin. The more distal aspect of the carotid and vertebral arteries is within normal limits.  Limited  intracranial: Intracranial structures as visualized are within normal limits.  Visualized orbits: Within normal limits.  Mastoids and visualized paranasal sinuses: Within normal limits.  Skeleton: Bony structures show degenerative change of the cervical spine.  Upper chest: Visualized lung apices are within normal limits. Aortic calcifications are noted.  IMPRESSION: Small left tonsillar retention cyst. This is a benign and common finding.  Small tonsillar stones.  Resolution of previously seen inflammatory changes involving the right submandibular gland.  Mild stenosis in the proximal left internal carotid artery.   Electronically Signed   By: Inez Catalina M.D.   On: 03/29/2015 14:27      EKG Interpretation None       2:55 PM Pt able to raise and lower jaw more now.    MDM   Final diagnoses:  Left facial swelling    Nontoxic patient with low grade temperature (99.1 orally here) with left sided facial swelling and pain, worse with moving jaw - tender over  parotid gland.  CT unremarkable. Labs unremarkable.  Pt also seen and examined by Dr Regenia Skeeter and discussed pt, workup, and treatment.  Pt has taken two doses of azithromycin.  Will switch to clindamycin, add pain medication.  Recommended sour candies, lemon drops at home.  Close PCP follow up, ENT referral, return precautions.  Discussed result, findings, treatment, and follow up  with patient.  Pt given return precautions.  Pt verbalizes understanding and agrees with plan.       Clayton Bibles, PA-C 03/29/15 1640  Sherwood Gambler, MD 03/31/15 1650

## 2015-03-29 NOTE — Discharge Instructions (Signed)
Read the information below.  Use the prescribed medication as directed.  Please discuss all new medications with your pharmacist.  Do not take additional tylenol while taking the prescribed pain medication to avoid overdose.  You may return to the Emergency Department at any time for worsening condition or any new symptoms that concern you.  If you develop fevers, increased swelling in your face, difficulty swallowing or breathing, return to the ER immediately for a recheck.

## 2015-03-31 DIAGNOSIS — I10 Essential (primary) hypertension: Secondary | ICD-10-CM | POA: Diagnosis not present

## 2015-03-31 DIAGNOSIS — M2662 Arthralgia of temporomandibular joint: Secondary | ICD-10-CM | POA: Diagnosis not present

## 2015-04-02 DIAGNOSIS — M542 Cervicalgia: Secondary | ICD-10-CM | POA: Diagnosis not present

## 2015-04-07 DIAGNOSIS — M542 Cervicalgia: Secondary | ICD-10-CM | POA: Diagnosis not present

## 2015-04-10 DIAGNOSIS — E785 Hyperlipidemia, unspecified: Secondary | ICD-10-CM | POA: Diagnosis not present

## 2015-04-10 DIAGNOSIS — I10 Essential (primary) hypertension: Secondary | ICD-10-CM | POA: Diagnosis not present

## 2015-04-10 DIAGNOSIS — I25700 Atherosclerosis of coronary artery bypass graft(s), unspecified, with unstable angina pectoris: Secondary | ICD-10-CM | POA: Diagnosis not present

## 2015-07-09 DIAGNOSIS — G4730 Sleep apnea, unspecified: Secondary | ICD-10-CM | POA: Diagnosis not present

## 2015-07-09 DIAGNOSIS — N189 Chronic kidney disease, unspecified: Secondary | ICD-10-CM | POA: Diagnosis not present

## 2015-07-09 DIAGNOSIS — I10 Essential (primary) hypertension: Secondary | ICD-10-CM | POA: Diagnosis not present

## 2015-07-09 DIAGNOSIS — E039 Hypothyroidism, unspecified: Secondary | ICD-10-CM | POA: Diagnosis not present

## 2015-07-27 DIAGNOSIS — Z1231 Encounter for screening mammogram for malignant neoplasm of breast: Secondary | ICD-10-CM | POA: Diagnosis not present

## 2015-08-04 DIAGNOSIS — I129 Hypertensive chronic kidney disease with stage 1 through stage 4 chronic kidney disease, or unspecified chronic kidney disease: Secondary | ICD-10-CM | POA: Diagnosis not present

## 2015-08-04 DIAGNOSIS — I10 Essential (primary) hypertension: Secondary | ICD-10-CM | POA: Diagnosis not present

## 2015-08-04 DIAGNOSIS — M549 Dorsalgia, unspecified: Secondary | ICD-10-CM | POA: Diagnosis not present

## 2015-08-04 DIAGNOSIS — M25562 Pain in left knee: Secondary | ICD-10-CM | POA: Diagnosis not present

## 2015-08-08 DIAGNOSIS — M545 Low back pain: Secondary | ICD-10-CM | POA: Diagnosis not present

## 2015-08-08 DIAGNOSIS — M5441 Lumbago with sciatica, right side: Secondary | ICD-10-CM | POA: Diagnosis not present

## 2015-08-08 DIAGNOSIS — M25561 Pain in right knee: Secondary | ICD-10-CM | POA: Diagnosis not present

## 2015-08-10 DIAGNOSIS — M5441 Lumbago with sciatica, right side: Secondary | ICD-10-CM | POA: Diagnosis not present

## 2015-08-10 DIAGNOSIS — M545 Low back pain: Secondary | ICD-10-CM | POA: Diagnosis not present

## 2015-08-10 DIAGNOSIS — M25561 Pain in right knee: Secondary | ICD-10-CM | POA: Diagnosis not present

## 2015-08-20 DIAGNOSIS — M5441 Lumbago with sciatica, right side: Secondary | ICD-10-CM | POA: Diagnosis not present

## 2015-08-25 DIAGNOSIS — M5441 Lumbago with sciatica, right side: Secondary | ICD-10-CM | POA: Diagnosis not present

## 2015-08-26 DIAGNOSIS — Z0000 Encounter for general adult medical examination without abnormal findings: Secondary | ICD-10-CM | POA: Diagnosis not present

## 2015-08-27 DIAGNOSIS — M545 Low back pain: Secondary | ICD-10-CM | POA: Diagnosis not present

## 2015-08-27 DIAGNOSIS — M5441 Lumbago with sciatica, right side: Secondary | ICD-10-CM | POA: Diagnosis not present

## 2015-08-28 DIAGNOSIS — M5441 Lumbago with sciatica, right side: Secondary | ICD-10-CM | POA: Diagnosis not present

## 2015-08-31 DIAGNOSIS — M5430 Sciatica, unspecified side: Secondary | ICD-10-CM | POA: Diagnosis not present

## 2015-08-31 DIAGNOSIS — N189 Chronic kidney disease, unspecified: Secondary | ICD-10-CM | POA: Diagnosis not present

## 2015-08-31 DIAGNOSIS — F331 Major depressive disorder, recurrent, moderate: Secondary | ICD-10-CM | POA: Diagnosis not present

## 2015-08-31 DIAGNOSIS — M545 Low back pain: Secondary | ICD-10-CM | POA: Diagnosis not present

## 2015-08-31 DIAGNOSIS — I10 Essential (primary) hypertension: Secondary | ICD-10-CM | POA: Diagnosis not present

## 2015-09-03 DIAGNOSIS — M5441 Lumbago with sciatica, right side: Secondary | ICD-10-CM | POA: Diagnosis not present

## 2015-09-14 DIAGNOSIS — M5441 Lumbago with sciatica, right side: Secondary | ICD-10-CM | POA: Diagnosis not present

## 2015-09-15 DIAGNOSIS — I129 Hypertensive chronic kidney disease with stage 1 through stage 4 chronic kidney disease, or unspecified chronic kidney disease: Secondary | ICD-10-CM | POA: Diagnosis not present

## 2015-09-15 DIAGNOSIS — N189 Chronic kidney disease, unspecified: Secondary | ICD-10-CM | POA: Diagnosis not present

## 2015-09-15 DIAGNOSIS — I10 Essential (primary) hypertension: Secondary | ICD-10-CM | POA: Diagnosis not present

## 2015-09-15 DIAGNOSIS — F331 Major depressive disorder, recurrent, moderate: Secondary | ICD-10-CM | POA: Diagnosis not present

## 2015-09-15 DIAGNOSIS — M1712 Unilateral primary osteoarthritis, left knee: Secondary | ICD-10-CM | POA: Diagnosis not present

## 2015-09-15 DIAGNOSIS — E785 Hyperlipidemia, unspecified: Secondary | ICD-10-CM | POA: Diagnosis not present

## 2015-09-22 DIAGNOSIS — M5441 Lumbago with sciatica, right side: Secondary | ICD-10-CM | POA: Diagnosis not present

## 2015-09-28 DIAGNOSIS — M5126 Other intervertebral disc displacement, lumbar region: Secondary | ICD-10-CM | POA: Diagnosis not present

## 2015-09-28 DIAGNOSIS — M5127 Other intervertebral disc displacement, lumbosacral region: Secondary | ICD-10-CM | POA: Diagnosis not present

## 2015-10-06 DIAGNOSIS — M5441 Lumbago with sciatica, right side: Secondary | ICD-10-CM | POA: Diagnosis not present

## 2015-12-21 ENCOUNTER — Encounter (HOSPITAL_COMMUNITY): Payer: Self-pay

## 2015-12-21 ENCOUNTER — Other Ambulatory Visit: Payer: Self-pay

## 2015-12-21 ENCOUNTER — Emergency Department (HOSPITAL_COMMUNITY): Payer: Commercial Managed Care - HMO

## 2015-12-21 ENCOUNTER — Emergency Department (HOSPITAL_COMMUNITY)
Admission: EM | Admit: 2015-12-21 | Discharge: 2015-12-21 | Disposition: A | Discharge status: Home / Self Care | Payer: Commercial Managed Care - HMO | Attending: Emergency Medicine | Admitting: Emergency Medicine

## 2015-12-21 DIAGNOSIS — E785 Hyperlipidemia, unspecified: Secondary | ICD-10-CM | POA: Insufficient documentation

## 2015-12-21 DIAGNOSIS — R0789 Other chest pain: Secondary | ICD-10-CM | POA: Diagnosis not present

## 2015-12-21 DIAGNOSIS — I10 Essential (primary) hypertension: Secondary | ICD-10-CM | POA: Insufficient documentation

## 2015-12-21 DIAGNOSIS — K219 Gastro-esophageal reflux disease without esophagitis: Secondary | ICD-10-CM | POA: Insufficient documentation

## 2015-12-21 DIAGNOSIS — F17210 Nicotine dependence, cigarettes, uncomplicated: Secondary | ICD-10-CM | POA: Diagnosis not present

## 2015-12-21 DIAGNOSIS — R079 Chest pain, unspecified: Secondary | ICD-10-CM | POA: Diagnosis present

## 2015-12-21 DIAGNOSIS — Z79899 Other long term (current) drug therapy: Secondary | ICD-10-CM | POA: Diagnosis not present

## 2015-12-21 LAB — CBC
HEMATOCRIT: 40.5 % (ref 36.0–46.0)
Hemoglobin: 13.5 g/dL (ref 12.0–15.0)
MCH: 28 pg (ref 26.0–34.0)
MCHC: 33.3 g/dL (ref 30.0–36.0)
MCV: 83.9 fL (ref 78.0–100.0)
Platelets: 261 10*3/uL (ref 150–400)
RBC: 4.83 MIL/uL (ref 3.87–5.11)
RDW: 14.4 % (ref 11.5–15.5)
WBC: 4 10*3/uL (ref 4.0–10.5)

## 2015-12-21 LAB — BASIC METABOLIC PANEL
Anion gap: 8 (ref 5–15)
BUN: 12 mg/dL (ref 6–20)
CHLORIDE: 108 mmol/L (ref 101–111)
CO2: 23 mmol/L (ref 22–32)
Calcium: 9.3 mg/dL (ref 8.9–10.3)
Creatinine, Ser: 1.22 mg/dL — ABNORMAL HIGH (ref 0.44–1.00)
GFR calc Af Amer: 49 mL/min — ABNORMAL LOW (ref >60)
GFR calc non Af Amer: 42 mL/min — ABNORMAL LOW (ref >60)
Glucose, Bld: 97 mg/dL (ref 65–99)
POTASSIUM: 3.3 mmol/L — AB (ref 3.5–5.1)
SODIUM: 139 mmol/L (ref 135–145)

## 2015-12-21 LAB — I-STAT TROPONIN, ED: Troponin i, poc: 0.02 ng/mL (ref 0.00–0.08)

## 2015-12-21 NOTE — ED Provider Notes (Signed)
CSN: [id number]     Arrival date & time 12/21/15  1335 History   First MD Initiated Contact with Patient 12/21/15 1717     Chief Complaint  Patient presents with  . Chest Pain     (Consider location/radiation/quality/duration/timing/severity/associated sxs/prior Treatment) HPI   Joan Elliott is a 75 y.o. female who presents today for evaluation of height and pinching sensation in her lower chest, on and off, both day and night for 1 week. The discomfort is mild. There is no associated cough, shortness of breath, fever, chills, weakness or dizziness. She has had some constipation and lower abdominal cramping which improved after taking Colace. She is taking all of her medications as prescribed. She is unhappy with her PCP and is trying to get a new one. There are no other known modifying factors.   Past Medical History  Diagnosis Date  . Hypertension   . Thyroid disease   . Coronary artery disease     s/p stent mid RCA 2003  . Tobacco abuse   . Hypothyroidism   . HLD (hyperlipidemia)   . Gout   . GERD (gastroesophageal reflux disease)    Past Surgical History  Procedure Laterality Date  . Abdominal hysterectomy    . Thyroid surgery     Family History  Problem Relation Age of Onset  . Hypertension Mother   . Kidney disease Mother   . Hypertension Brother   . Hypertension Sister    Social History  Substance Use Topics  . Smoking status: Current Some Day Smoker -- 0.50 packs/day    Types: Cigarettes  . Smokeless tobacco: None  . Alcohol Use: No   OB History    No data available     Review of Systems  All other systems reviewed and are negative.     Allergies  Ivp dye and Penicillins  Home Medications   Prior to Admission medications   Medication Sig Start Date End Date Taking? Authorizing Provider  albuterol (PROVENTIL HFA;VENTOLIN HFA) 108 (90 BASE) MCG/ACT inhaler Inhale 2 puffs into the lungs every 6 (six) hours as needed for wheezing or shortness of  breath.   Yes Historical Provider, MD  isosorbide mononitrate (IMDUR) 30 MG 24 hr tablet Take 30 mg by mouth daily. 02/02/15  Yes Historical Provider, MD  levothyroxine (SYNTHROID, LEVOTHROID) 50 MCG tablet Take 50 mcg by mouth daily. 02/02/15  Yes Historical Provider, MD  lisinopril (PRINIVIL,ZESTRIL) 40 MG tablet Take 40 mg by mouth every morning. 10/14/15  Yes Historical Provider, MD  lovastatin (MEVACOR) 40 MG tablet Take 40 mg by mouth at bedtime. 12/24/14  Yes Historical Provider, MD  methocarbamol (ROBAXIN) 500 MG tablet Take 500 mg by mouth 2 (two) times daily as needed for muscle spasms.  10/06/15  Yes Historical Provider, MD  metoprolol succinate (TOPROL-XL) 25 MG 24 hr tablet Take 25 mg by mouth daily. 02/02/15  Yes Historical Provider, MD  naproxen sodium (ALEVE) 220 MG tablet Take 220-440 mg by mouth 2 (two) times daily as needed (FOR PAIN).   Yes Historical Provider, MD  NIFEdipine (PROCARDIA-XL/ADALAT CC) 60 MG 24 hr tablet Take 60 mg by mouth daily. 02/02/15  Yes Historical Provider, MD  pantoprazole (PROTONIX) 40 MG tablet Take 40 mg by mouth daily. 02/02/15  Yes Historical Provider, MD  allopurinol (ZYLOPRIM) 100 MG tablet Take 100 mg by mouth daily. 02/02/15   Historical Provider, MD  clindamycin (CLEOCIN) 300 MG capsule Take 1 capsule (300 mg total) by mouth 4 (four) times  daily. X 7 days Patient not taking: Reported on 12/21/2015 03/29/15   Clayton Bibles, PA-C  HYDROcodone-acetaminophen (HYCET) 7.5-325 mg/15 ml solution Take 10 mLs by mouth 4 (four) times daily as needed for moderate pain or severe pain. Patient not taking: Reported on 12/21/2015 03/29/15   Clayton Bibles, PA-C   BP 109/69 mmHg  Pulse 100  Temp(Src) 99.1 F (37.3 C) (Oral)  Resp 20  Ht 5' (1.524 m)  Wt 210 lb (95.255 kg)  BMI 41.01 kg/m2  SpO2 99% Physical Exam  Constitutional: She is oriented to person, place, and time. She appears well-developed and well-nourished. No distress.  Elderly, somewhat overweight.  HENT:  Head:  Normocephalic and atraumatic.  Right Ear: External ear normal.  Left Ear: External ear normal.  Eyes: Conjunctivae and EOM are normal. Pupils are equal, round, and reactive to light.  Neck: Normal range of motion and phonation normal. Neck supple.  Cardiovascular: Normal rate, regular rhythm and normal heart sounds.   Pulmonary/Chest: Effort normal and breath sounds normal. No respiratory distress. She exhibits no bony tenderness.  Abdominal: Soft. She exhibits no distension. There is no tenderness. There is no guarding.  Musculoskeletal: Normal range of motion.  Neurological: She is alert and oriented to person, place, and time. No cranial nerve deficit or sensory deficit. She exhibits normal muscle tone. Coordination normal.  Skin: Skin is warm, dry and intact.  Psychiatric: She has a normal mood and affect. Her behavior is normal. Judgment and thought content normal.  Nursing note and vitals reviewed.   ED Course  Procedures (including critical care time)  Medications - No data to display  Patient Vitals for the past 24 hrs:  BP Temp Temp src Pulse Resp SpO2 Height Weight  12/21/15 1349 109/69 mmHg 99.1 F (37.3 C) Oral 100 20 99 % 5' (1.524 m) 210 lb (95.255 kg)    5:56 PM Reevaluation with update and discussion. After initial assessment and treatment, an updated evaluation reveals No change in clinical status. Findings discussed with the patient and all questions were answered. [place] Review Labs Reviewed  BASIC METABOLIC PANEL - Abnormal; Notable for the following:    Potassium 3.3 (*)    Creatinine, Ser 1.22 (*)    GFR calc non Af Amer 42 (*)    GFR calc Af Amer 49 (*)    All other components within normal limits  CBC  I-STAT TROPOININ, ED    Imaging Review Dg Chest 2 View  12/21/2015  CLINICAL DATA:  Chest pressure for 1 week worse at night. Coronary artery disease. EXAM: CHEST  2 VIEW COMPARISON:  02/23/2015 FINDINGS: Atherosclerotic calcification  in the aortic arch noted. Heart size normal. Low lung volumes are present, causing crowding of the pulmonary vasculature. Thoracic spondylosis is present. The lungs appear clear.  No pleural effusion. IMPRESSION: 1.  No active cardiopulmonary disease is radiographically apparent. 2. Atherosclerotic aortic arch. 3. Thoracic spondylosis. Electronically Signed   By: Van Clines M.D.   On: 12/21/2015 14:40   I have personally reviewed and evaluated these images and lab results as part of my medical decision-making.   EKG Interpretation   Date/Time:  Monday December 21 2015 13:49:23 EDT Ventricular Rate:  103 PR Interval:  140 QRS Duration: 110 QT Interval:  354 QTC Calculation: 463 R Axis:   -20 Text Interpretation:  Sinus tachycardia Right atrial enlargement Possible  Anterior infarct , age undetermined ST \\T \ T wave abnormality, consider  lateral ischemia Abnormal  ECG since last tracing no significant change  Confirmed by [facility]  MD, Syvanna Ciolino [phone]) on 12/21/2015 5:43:58 PM      MDM   Final diagnoses:  Gastroesophageal reflux disease, esophagitis presence not specified    Nonspecific ongoing chest discomfort, most likely to represent complications of reflux disease. Doubt ACS, PE, pneumonia, or metabolic instability.   Nursing Notes Reviewed/ Care Coordinated Applicable Imaging Reviewed Interpretation of Laboratory Data incorporated into ED treatment  The patient appears reasonably screened and/or stabilized for discharge and I doubt any other medical condition or other [facility] requiring further screening, evaluation, or treatment in the ED at this time prior to discharge.  Plan: Home Medications- add Maalox; Home Treatments- rest; return here if the recommended treatment, does not improve the symptoms; Recommended follow up- PCP, when necessary     Daleen Bo, MD 12/22/15 0105

## 2015-12-21 NOTE — ED Notes (Signed)
Patient complains of 1 week of chest pain that feels like a band around her chest, non-radiating. Complains of shortness of breath with same.

## 2015-12-21 NOTE — Discharge Instructions (Signed)
Your discomfort is most likely related to GERD. Use Maalox before meals and at bedtime for 1 or 2 weeks to see if it helps. Follow-up with a primary care doctor for ongoing care.   Gastroesophageal Reflux Disease, Adult Normally, food travels down the esophagus and stays in the stomach to be digested. If a person has gastroesophageal reflux disease (GERD), food and stomach acid move back up into the esophagus. When this happens, the esophagus becomes sore and swollen (inflamed). Over time, GERD can make small holes (ulcers) in the lining of the esophagus. HOME CARE Diet  Follow a diet as told by your doctor. You may need to avoid foods and drinks such as:  Coffee and tea (with or without caffeine).  Drinks that contain alcohol.  Energy drinks and sports drinks.  Carbonated drinks or sodas.  Chocolate and cocoa.  Peppermint and mint flavorings.  Garlic and onions.  Horseradish.  Spicy and acidic foods, such as peppers, chili powder, curry powder, vinegar, hot sauces, and BBQ sauce.  Citrus fruit juices and citrus fruits, such as oranges, lemons, and limes.  Tomato-based foods, such as red sauce, chili, salsa, and pizza with red sauce.  Fried and fatty foods, such as donuts, french fries, potato chips, and high-fat dressings.  High-fat meats, such as hot dogs, rib eye steak, sausage, ham, and bacon.  High-fat dairy items, such as whole milk, butter, and cream cheese.  Eat small meals often. Avoid eating large meals.  Avoid drinking large amounts of liquid with your meals.  Avoid eating meals during the 2-3 hours before bedtime.  Avoid lying down right after you eat.  Do not exercise right after you eat. General Instructions  Pay attention to any changes in your symptoms.  Take over-the-counter and prescription medicines only as told by your doctor. Do not take aspirin, ibuprofen, or other NSAIDs unless your doctor says it is okay.  Do not use any tobacco  products, including cigarettes, chewing tobacco, and e-cigarettes. If you need help quitting, ask your doctor.  Wear loose clothes. Do not wear anything tight around your waist.  Raise (elevate) the head of your bed about 6 inches (15 cm).  Try to lower your stress. If you need help doing this, ask your doctor.  If you are overweight, lose an amount of weight that is healthy for you. Ask your doctor about a safe weight loss goal.  Keep all follow-up visits as told by your doctor. This is important. GET HELP IF:  You have new symptoms.  You lose weight and you do not know why it is happening.  You have trouble swallowing, or it hurts to swallow.  You have wheezing or a cough that keeps happening.  Your symptoms do not get better with treatment.  You have a hoarse voice. GET HELP RIGHT AWAY IF:  You have pain in your arms, neck, jaw, teeth, or back.  You feel sweaty, dizzy, or light-headed.  You have chest pain or shortness of breath.  You throw up (vomit) and your throw up looks like blood or coffee grounds.  You pass out (faint).  Your poop (stool) is bloody or black.  You cannot swallow, drink, or eat.   This information is not intended to replace advice given to you by your health care provider. Make sure you discuss any questions you have with your health care provider.   Document Released: 12/07/2007 Document Revised: 03/11/2015 Document Reviewed: 10/15/2014 Elsevier Interactive Patient Education [employer].

## 2016-03-20 ENCOUNTER — Emergency Department (HOSPITAL_COMMUNITY): Payer: Commercial Managed Care - HMO

## 2016-03-20 ENCOUNTER — Encounter (HOSPITAL_COMMUNITY): Payer: Self-pay

## 2016-03-20 ENCOUNTER — Emergency Department (HOSPITAL_COMMUNITY)
Admission: EM | Admit: 2016-03-20 | Discharge: 2016-03-20 | Disposition: A | Discharge status: Home / Self Care | Payer: Commercial Managed Care - HMO | Attending: Emergency Medicine | Admitting: Emergency Medicine

## 2016-03-20 DIAGNOSIS — F17210 Nicotine dependence, cigarettes, uncomplicated: Secondary | ICD-10-CM | POA: Insufficient documentation

## 2016-03-20 DIAGNOSIS — E039 Hypothyroidism, unspecified: Secondary | ICD-10-CM | POA: Insufficient documentation

## 2016-03-20 DIAGNOSIS — M109 Gout, unspecified: Secondary | ICD-10-CM

## 2016-03-20 DIAGNOSIS — R079 Chest pain, unspecified: Secondary | ICD-10-CM | POA: Insufficient documentation

## 2016-03-20 DIAGNOSIS — M10032 Idiopathic gout, left wrist: Secondary | ICD-10-CM | POA: Insufficient documentation

## 2016-03-20 DIAGNOSIS — M25532 Pain in left wrist: Secondary | ICD-10-CM | POA: Diagnosis not present

## 2016-03-20 DIAGNOSIS — I2510 Atherosclerotic heart disease of native coronary artery without angina pectoris: Secondary | ICD-10-CM | POA: Insufficient documentation

## 2016-03-20 DIAGNOSIS — I10 Essential (primary) hypertension: Secondary | ICD-10-CM | POA: Diagnosis not present

## 2016-03-20 DIAGNOSIS — M7989 Other specified soft tissue disorders: Secondary | ICD-10-CM | POA: Diagnosis not present

## 2016-03-20 DIAGNOSIS — R0789 Other chest pain: Secondary | ICD-10-CM | POA: Diagnosis not present

## 2016-03-20 LAB — I-STAT TROPONIN, ED: Troponin i, poc: 0.02 ng/mL (ref 0.00–0.08)

## 2016-03-20 LAB — BASIC METABOLIC PANEL
Anion gap: 11 (ref 5–15)
BUN: 9 mg/dL (ref 6–20)
CHLORIDE: 107 mmol/L (ref 101–111)
CO2: 21 mmol/L — ABNORMAL LOW (ref 22–32)
CREATININE: 1.19 mg/dL — AB (ref 0.44–1.00)
Calcium: 9.5 mg/dL (ref 8.9–10.3)
GFR, EST AFRICAN AMERICAN: 50 mL/min — AB (ref >60)
GFR, EST NON AFRICAN AMERICAN: 43 mL/min — AB (ref >60)
Glucose, Bld: 128 mg/dL — ABNORMAL HIGH (ref 65–99)
POTASSIUM: 3.8 mmol/L (ref 3.5–5.1)
SODIUM: 139 mmol/L (ref 135–145)

## 2016-03-20 LAB — CBC
HEMATOCRIT: 43.5 % (ref 36.0–46.0)
Hemoglobin: 14.8 g/dL (ref 12.0–15.0)
MCH: 28.5 pg (ref 26.0–34.0)
MCHC: 34 g/dL (ref 30.0–36.0)
MCV: 83.8 fL (ref 78.0–100.0)
PLATELETS: 278 10*3/uL (ref 150–400)
RBC: 5.19 MIL/uL — ABNORMAL HIGH (ref 3.87–5.11)
RDW: 14.7 % (ref 11.5–15.5)
WBC: 4.4 10*3/uL (ref 4.0–10.5)

## 2016-03-20 MED ORDER — HYDROCODONE-ACETAMINOPHEN 5-325 MG PO TABS
2.0000 | ORAL_TABLET | Freq: Once | ORAL | Status: AC
  Administered 2016-03-20: 2 via ORAL
  Filled 2016-03-20: qty 2

## 2016-03-20 MED ORDER — HYDROCODONE-ACETAMINOPHEN 5-325 MG PO TABS: 2.0000 | ORAL_TABLET | ORAL | 0 refills | Status: DC | PRN

## 2016-03-20 MED ORDER — COLCHICINE 0.6 MG PO TABS
1.2000 mg | ORAL_TABLET | Freq: Once | ORAL | Status: AC
  Administered 2016-03-20: 1.2 mg via ORAL
  Filled 2016-03-20: qty 2

## 2016-03-20 NOTE — Discharge Instructions (Signed)
Take your medicines exactly as prescribed,  See your doctor in 2 days for recheck Indomethacin 3 times a day for 3 days Return to the ER for severe or worsening symptoms.

## 2016-03-20 NOTE — ED Provider Notes (Signed)
[facility] DEPT Provider Note   CSN: [id number] Arrival date & time: 03/20/16  1129     History   Chief Complaint Chief Complaint  Patient presents with  . Chest Pain  . Joint Swelling  . Flank Pain    HPI Joan Elliott is a 75 y.o. female.  The patient is a 75 year old female, she does have a history of gout, hypertension, high cholesterol, she also has a history of a cardiac stent which was placed approximately 15 years ago in [place], she moved here within the last 2 years to the city and since that time has had nightly chest pain which she describes as a tight bandlike sensation when she lays down at night consistent with her acid reflux which comes and goes depending on how she takes her medication. She also reports of the right lower back pain, upon further evaluation and upon further history the patient states that she has had a further workup by a orthopedist and they have obtained an MRI, there was no definitive diagnosis. The patient has been taking anti-inflammatory medications to help with the pain. The reason that the patient came to the emergency department today was because she developed acute onset of left hand and wrist pain this morning when she awoke. She states that initially it was very swollen, warm, there is decreased range of motion because of severe pain. She does have a history of gout in her feet and toes intermittently but currently is not having that. She has not taken her medications other than her daily allopurinol. She denies fevers or redness of the hand at this time. There is no numbness or weakness. She denies any chest pain or shortness of breath at this time. She has chronic back pain daily almost all day long.    Chest Pain    Flank Pain  Associated symptoms include chest pain.    Past Medical History:  Diagnosis Date  . Coronary artery disease    s/p stent mid RCA 2003  . GERD (gastroesophageal reflux disease)   . Gout   . HLD  (hyperlipidemia)   . Hypertension   . Hypothyroidism   . Thyroid disease   . Tobacco abuse     Patient Active Problem List   Diagnosis Date Noted  . Chest pain, rule out acute myocardial infarction   . Chest pain 02/23/2015  . Hypokalemia 02/23/2015  . AKI (acute kidney injury) ([place]) 02/23/2015  . Diarrhea 02/23/2015  . Hypertension   . Coronary artery disease   . Thyroid disease   . Tobacco abuse   . Hypothyroidism   . HLD (hyperlipidemia)   . Gout   . GERD (gastroesophageal reflux disease)     Past Surgical History:  Procedure Laterality Date  . ABDOMINAL HYSTERECTOMY    . THYROID SURGERY      OB History    No data available       Home Medications    Prior to Admission medications   Medication Sig Start Date End Date Taking? Authorizing Provider  albuterol (PROVENTIL HFA;VENTOLIN HFA) 108 (90 BASE) MCG/ACT inhaler Inhale 2 puffs into the lungs every 6 (six) hours as needed for wheezing or shortness of breath.   Yes Historical Provider, MD  allopurinol (ZYLOPRIM) 100 MG tablet Take 100 mg by mouth daily. 02/02/15  Yes Historical Provider, MD  diphenhydrAMINE (BENADRYL) 25 MG tablet Take 25 mg by mouth at bedtime as needed for itching.   Yes Historical Provider, MD  Fluticasone-Salmeterol (ADVAIR)  250-50 MCG/DOSE AEPB Inhale 2 puffs into the lungs daily as needed (for wheezing or shortness of breath).   Yes Historical Provider, MD  isosorbide mononitrate (IMDUR) 30 MG 24 hr tablet Take 30 mg by mouth daily. 02/02/15  Yes Historical Provider, MD  levothyroxine (SYNTHROID, LEVOTHROID) 50 MCG tablet Take 50 mcg by mouth daily. 02/02/15  Yes Historical Provider, MD  lisinopril (PRINIVIL,ZESTRIL) 40 MG tablet Take 40 mg by mouth every morning. 10/14/15  Yes Historical Provider, MD  lovastatin (MEVACOR) 40 MG tablet Take 40 mg by mouth at bedtime. 12/24/14  Yes Historical Provider, MD  methocarbamol (ROBAXIN) 500 MG tablet Take 500 mg by mouth 2 (two) times daily as needed for  muscle spasms.  10/06/15  Yes Historical Provider, MD  metoprolol succinate (TOPROL-XL) 25 MG 24 hr tablet Take 25 mg by mouth daily. 02/02/15  Yes Historical Provider, MD  naproxen sodium (ALEVE) 220 MG tablet Take 440 mg by mouth 2 (two) times daily as needed (FOR PAIN).    Yes Historical Provider, MD  NIFEdipine (PROCARDIA-XL/ADALAT CC) 60 MG 24 hr tablet Take 60 mg by mouth daily. 02/02/15  Yes Historical Provider, MD  pantoprazole (PROTONIX) 40 MG tablet Take 40 mg by mouth daily. 02/02/15  Yes Historical Provider, MD  HYDROcodone-acetaminophen (NORCO/VICODIN) 5-325 MG tablet Take 2 tablets by mouth every 4 (four) hours as needed. 03/20/16   Noemi Chapel, MD    Family History Family History  Problem Relation Age of Onset  . Hypertension Mother   . Kidney disease Mother   . Hypertension Brother   . Hypertension Sister     Social History Social History  Substance Use Topics  . Smoking status: Current Some Day Smoker    Packs/day: 0.50    Types: Cigarettes  . Smokeless tobacco: Never Used  . Alcohol use No     Allergies   Ivp dye [iodinated diagnostic agents] and Penicillins   Review of Systems Review of Systems  Cardiovascular: Positive for chest pain.  Genitourinary: Positive for flank pain.  All other systems reviewed and are negative.    Physical Exam Updated Vital Signs BP 129/84   Pulse 68   Temp 98.2 F (36.8 C) (Oral)   Resp 23   Ht 5' (1.524 m)   SpO2 98%   Physical Exam  Constitutional: She appears well-developed and well-nourished. No distress.  HENT:  Head: Normocephalic and atraumatic.  Mouth/Throat: Oropharynx is clear and moist. No oropharyngeal exudate.  Eyes: Conjunctivae and EOM are normal. Pupils are equal, round, and reactive to light. Right eye exhibits no discharge. Left eye exhibits no discharge. No scleral icterus.  Neck: Normal range of motion. Neck supple. No JVD present. No thyromegaly present.  Cardiovascular: Normal rate, regular rhythm,  normal heart sounds and intact distal pulses.  Exam reveals no gallop and no friction rub.   No murmur heard. Heart sounds are normal, no murmurs, no rubs, no gallops, no JVD, no peripheral edema  Pulmonary/Chest: Effort normal and breath sounds normal. No respiratory distress. She has no wheezes. She has no rales.  Abdominal: Soft. Bowel sounds are normal. She exhibits no distension and no mass. There is no tenderness.  Musculoskeletal: Normal range of motion. She exhibits tenderness ( Tenderness present over the left wrist and the right lower back, no spinal tenderness). She exhibits no edema.  Tenderness to palpation over the left wrist, significant decreased range of motion secondary to severe pain. She is able to flex and extend the fingers with no  pain.  Lymphadenopathy:    She has no cervical adenopathy.  Neurological: She is alert. Coordination normal.  Normal speech, normal strength in all 4 extremities, strength at the left hand is limited by pain with grip and range of motion of the wrist  Skin: Skin is warm and dry. No rash noted. No erythema.  No redness or warmth overlying the joint on the left wrist  Psychiatric: She has a normal mood and affect. Her behavior is normal.  Nursing note and vitals reviewed.    ED Treatments / Results  Labs (all labs ordered are listed, but only abnormal results are displayed) Labs Reviewed  BASIC METABOLIC PANEL - Abnormal; Notable for the following:       Result Value   CO2 21 (*)    Glucose, Bld 128 (*)    Creatinine, Ser 1.19 (*)    GFR calc non Af Amer 43 (*)    GFR calc Af Amer 50 (*)    All other components within normal limits  CBC - Abnormal; Notable for the following:    RBC 5.19 (*)    All other components within normal limits  I-STAT TROPOININ, ED    EKG  EKG Interpretation  Date/Time:  Sunday March 20 2016 11:46:13 EDT Ventricular Rate:  110 PR Interval:  134 QRS Duration: 106 QT Interval:  340 QTC  Calculation: 460 R Axis:   -44 Text Interpretation:  Sinus tachycardia Right atrial enlargement Left axis deviation Pulmonary disease pattern Septal infarct , age undetermined Abnormal ECG since last tracing no significant change Confirmed by Sabra Heck  MD, Sally Menard ([id number]) on 03/20/2016 4:49:44 PM       Radiology Dg Chest 2 View  Result Date: 03/20/2016 CLINICAL DATA:  Patient with chest pain for multiple months. Left wrist pain. Initial encounter. EXAM: CHEST  2 VIEW COMPARISON:  Chest radiograph 12/21/2015. FINDINGS: Normal cardiac and mediastinal contours. No consolidative pulmonary opacities. No pleural effusion or pneumothorax. Thoracic spine degenerative changes. Upper abdominal surgical clips. IMPRESSION: No acute cardiopulmonary process. Aortic atherosclerosis. Electronically Signed   By: Lovey Newcomer M.D.   On: 03/20/2016 12:59   Dg Wrist Complete Left  Result Date: 03/20/2016 CLINICAL DATA:  Left wrist pain and swelling. EXAM: LEFT WRIST - COMPLETE 3+ VIEW COMPARISON:  None. FINDINGS: Moderate-severe degenerative changes within the wrist identified with carpal crowding. No definite acute fracture or dislocation noted. Soft tissue swelling is present. Severe degenerative changes at the first carpometacarpal joint noted. IMPRESSION: Soft tissue swelling without definite acute bony abnormality. 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 Electronically Signed   By: Margarette Canada M.D.   On: 03/20/2016 13:02    Procedures Procedures (including critical care time)  Medications Ordered in ED Medications  colchicine tablet 1.2 mg (1.2 mg Oral Given 03/20/16 1722)  HYDROcodone-acetaminophen (NORCO/VICODIN) 5-325 MG per tablet 2 tablet (2 tablets Oral Given 03/20/16 1722)     Initial Impression / Assessment and Plan / ED Course  I have reviewed the triage vital signs and the nursing notes.  Pertinent labs & imaging results that were available during my care of the patient were reviewed  by me and considered in my medical decision making (see chart for details).  Clinical Course    The patient is overall well-appearing, she is not febrile or tachycardic, her EKG is nonischemic, her x-ray showed no signs of cardiac disease nor is there any signs of bony disease of the wrist. I suspect that she is having an acute gouty  attack of her left wrist. She will be given medication for that. Her troponin is negative, EKG is unremarkable, her chest pain is likely related to acid reflux and her back pain is being followed by orthopedics and does not appear to be an acute problem.  Improved with meds Stable for d/c. Instructed on reasons for return.   Final Clinical Impressions(s) / ED Diagnoses   Final diagnoses:  Acute gout of left wrist, unspecified cause    New Prescriptions New Prescriptions   HYDROCODONE-ACETAMINOPHEN (NORCO/VICODIN) 5-325 MG TABLET    Take 2 tablets by mouth every 4 (four) hours as needed.     Noemi Chapel, MD 03/20/16 [phone]

## 2016-03-20 NOTE — ED Triage Notes (Signed)
Per Pt, Pt is coming from home with complaints of left wrist swelling that started yesterday. Reports no injury. Pt reports Chest tightness that started a week ago and flank pain that started over a week ago. Pt reports urinary frequency and some lightheadedness.

## 2016-04-20 ENCOUNTER — Other Ambulatory Visit: Payer: Self-pay | Admitting: Pharmacist

## 2016-04-20 NOTE — Patient Outreach (Addendum)
Call patient's PCP's office and speak with Judeen Hammans, nurse in Dr. Fransico Setters office. Let Judeen Hammans know that I was reaching out to the patient regarding an EMMI Medication Adherence Campaign and that patient reports being out of all of her medications for about 2-3 weeks now, including isosorbide mononitrate, lisinopril, metoprolol and nifedipine. Let Judeen Hammans know that I requested that patient check her blood pressure and patient reported that it is 192/118 with a pulse of 92.   Let Judeen Hammans know that I also spoke with the pharmacist at patient's [facility] and found that patient is out of refills for her albuterol, isosorbide, allopurinol, levothyroxine and lisinopril. Let her know that patient has active refills for lovastatin, metoprolol and nifedipine. Let Judeen Hammans know that patient reports not being able to afford to pick up any of her medications today.   Judeen Hammans reports that the patient has not been seen since March. States that she will now give the patient a call to assess her and that she will discuss how to proceed with Dr. Criss Rosales. Request that Judeen Hammans call me back after speaking with the patient. Let Judeen Hammans know that if appropriate based on the provider's plan with the patient, I can make a request to the [place] [facility] to assist the patient.  Judeen Hammans states that she will call me back once she has spoken to the patient.  Harlow Asa, PharmD Clinical Pharmacist [facility] [phone]

## 2016-04-20 NOTE — Patient Outreach (Signed)
Call patient's [facility] and speak with pharmacist Sameer. Sameer reports that Joan Elliott has no refills on her albuterol, isosorbide, allopurinol, levothyroxine and lisinopril. Request that Sameer fax Dr. Criss Rosales for refills on these medications. Sameer reports that the patient does have active refills on her lovastatin, metoprolol and nifedipine. Reports that the copayment for these three medications is $39.  Harlow Asa, PharmD [facility] [phone]

## 2016-04-20 NOTE — Patient Outreach (Signed)
Outreach call to Lyman Bishop regarding her request for follow up from the [facility] Medication Adherence Campaign. Called and spoke with patient. HIPAA identifiers verified and verbal consent received.  Ms. Defrank reports that she is doing [place]. Reports that she has been out of all of her medications for about 2-3 weeks now. Reports that she has been traveling out of town a great deal due to illness in her family. Reports that she does not typically have difficulty with affording her medications. However, reports that due to all of this recent traveling, she has been unable to afford to pick up her medications. Reports that she does have an Advair inhaler that she has been using. Confirm that patient has been out of her blood pressure medications including isosorbide mononitrate, lisinopril, metoprolol and nifedipine. Patient reports that she does have a blood pressure monitor. Request that patient check her blood pressure now. Wait while patient checks and patient reports that it is 192/118 with a pulse of 92.   Let patient know that I will call her PCP and her [place] now and then call her back. Ms. Muhs confirms that she will be home.  Harlow Asa, PharmD Clinical Pharmacist [facility] [phone]

## 2016-04-20 NOTE — Patient Outreach (Signed)
Follow up call with Judeen Hammans in Dr. Fransico Setters office. Per Judeen Hammans, Dr. Criss Rosales stated that the patient needs to be seen in order to determine a plan about her blood pressure and her blood pressure medications. Judeen Hammans reports that she called the patient to let the patient know that she could schedule her for an appointment tomorrow. However, reports that the patient stated that she would schedule an appointment for around 11/1.  Judeen Hammans reports that Dr. Criss Rosales will not provide refills on any medications until the patient is seen again. Reports patient has not been seen since March. Judeen Hammans states that if the patient changes her mind and would like to be seen tomorrow, the patient may call back and Judeen Hammans will work her into the schedule.  Harlow Asa, PharmD Clinical Pharmacist [facility] [phone]

## 2016-04-21 ENCOUNTER — Other Ambulatory Visit: Payer: Self-pay | Admitting: Pharmacist

## 2016-04-21 DIAGNOSIS — F331 Major depressive disorder, recurrent, moderate: Secondary | ICD-10-CM | POA: Diagnosis not present

## 2016-04-21 DIAGNOSIS — I10 Essential (primary) hypertension: Secondary | ICD-10-CM | POA: Diagnosis not present

## 2016-04-21 DIAGNOSIS — I129 Hypertensive chronic kidney disease with stage 1 through stage 4 chronic kidney disease, or unspecified chronic kidney disease: Secondary | ICD-10-CM | POA: Diagnosis not present

## 2016-04-21 DIAGNOSIS — E039 Hypothyroidism, unspecified: Secondary | ICD-10-CM | POA: Diagnosis not present

## 2016-04-21 DIAGNOSIS — N189 Chronic kidney disease, unspecified: Secondary | ICD-10-CM | POA: Diagnosis not present

## 2016-04-21 NOTE — Patient Outreach (Signed)
Let Ms. Joan Elliott know that Joan Elliott will be able to provide her with a one time 1 month pharmacy emergency supply of her medications. Note that patient qualifies for this 1 month pharmacy emergency supply based on approval received from [facility] due to financial need.  Ms. Joan Elliott expresses appreciation for our help. Discuss with Ms. Joan Elliott the importance of medication adherence and planning ahead for the expense of her medications. Patient reports that she does not typically have difficulty with affording her medications. Discuss the extra help application with the patient. Patient reports that she has applied for this in the past, but that her income is too high to qualify. Reports that she will not have difficulty with affording her medications in the future.  Patient confirms that she has the samples of Bystolic 10 mg as provided by Dr. Criss Rosales. Confirms that she has already taken her dose of Bystolic for today.  Let Ms. Joan Elliott know that pharmacist Joan Elliott will be bringing her the medications from [facility] tomorrow between 9 and 10 am. Provide patient with Dawn's phone number. Again confirm patient's address. Let patient know that I will call to follow up with her again tomorrow afternoon.   Harlow Asa, PharmD Clinical Pharmacist [place] [facility] [phone]

## 2016-04-21 NOTE — Patient Outreach (Signed)
Call patient's [facility] [phone]) to confirm the refills that were called in for the patient. Speak with Edwena Felty in the pharmacy who reports that she has prescriptions for allopurinol, pantoprazole, clopidogrel, levothyroxine, amlodipine, losartan/hydrochlorothiazide and lovastatin ready for the patient. Edwena Felty reports that the total for these medications will be $37.  Harlow Asa, [facility] [phone]

## 2016-04-21 NOTE — Patient Outreach (Signed)
Call to follow up with Ms. Joan Elliott. Let Ms. Joan Elliott know that [facility] will be able to arrange transportation for her today to and from Dr. Fransico Setters office. Let patient know that I called Joan Elliott in Dr. Fransico Setters office this moring and arranged for her to be seen at the office by Dr. Criss Rosales at 1:45 pm. Discuss with Ms. Joan Elliott the importance of getting her blood pressure under control and keeping it controlled with her medications. Ms. Joan Elliott understanding and expresses appreciation. Let Ms. Joan Elliott know that transportation will be provided by [employer] transportation.   Let Ms. Joan Elliott know that Care Management Assistant Haynes Kerns will also be calling to follow up with her shortly to let her know when to expect [employer] to arrive. Confirm that patient has my phone number.  Harlow Asa, PharmD Clinical Pharmacist [facility] [phone]

## 2016-04-21 NOTE — Patient Outreach (Signed)
Called and spoke with patient again. HIPAA identifiers verified and verbal consent received.  Ms. Rarick reports that she spoke with Judeen Hammans and scheduled a follow up visit with Dr. Criss Rosales for 05/04/16. Ask patient about going to see Dr. Criss Rosales tomorrow. Ms. Schey reports that this option was not offered to her. Also reports that she would not have transportation for an appointment tomorrow. Reports that she uses [employer] for transportation, which would require her to have planned the appointment in advance. Ask patient if she would like for me to call tomorrow to arrange transportation and to get her a same day appointment. Ms. Delia is agreeable to this plan.   Ms. Moise states that she is feeling Okay. Review with Ms. Suk warning signs of high blood pressure. Provide patient with the phone number for the [facility] 24-hour nurse advice line and advise patient to call with any concerns or questions. Provide [name] with my phone number.  Harlow Asa, PharmD Clinical Pharmacist [facility] [phone]

## 2016-04-21 NOTE — Patient Outreach (Signed)
Call and speak with Judeen Hammans in Dr. Fransico Setters office. Request a same-day appointment be scheduled for Ms. Joan Elliott due to her uncontrolled blood pressure. Judeen Hammans puts the patient on the schedule with Dr. Criss Rosales for 1:45 pm.   Ask that Judeen Hammans give me a call back following the patient's appointment with Dr. Criss Rosales to let me know the plan for the patient's blood pressure management. Judeen Hammans takes my phone number and states that she will call.  Harlow Asa, PharmD Clinical Pharmacist [facility] [facility] [phone]

## 2016-04-21 NOTE — Patient Outreach (Addendum)
Speak with Beau Fanny, nurse with Dr. Criss Rosales. Joan Elliott reports that Dr. Criss Rosales has changed the patient's blood pressure medications, instructing the patient to take the following for her blood pressure:  Bystolic 10 mg once daily (samples given) Losartan/hydrochlorothiazide 100-12.5 mg each morning Amlodipine 5 mg twice daily  Confirm with Joan Elliott that the losartan/hydrochlorothiazide and amlodipine have been sent into the patient's pharmacy and that per Dr. Criss Rosales, patient is to stop metoprolol, nifedipine, isosorbide and lisinopril.  Joan Elliott confirms that Dr. Criss Rosales also sent to [facility] refills for levothyroxine, allopurinol, clopidogrel and pantoprazole.  Joan Elliott, PharmD Clinical Pharmacist [facility] [phone]

## 2016-04-22 ENCOUNTER — Other Ambulatory Visit: Payer: Self-pay | Admitting: Pharmacist

## 2016-04-22 NOTE — Patient Outreach (Signed)
Receive a call back from Joan Elliott. HIPAA identifiers verified and verbal consent received.  Joan Elliott reports that she is feeling better today. Reports that her head in particular is feeling better, headache gone. Ask about patient's blood pressure. Reports that she forgot to check it. Ask that patient takes her blood pressure now. Patient reports a blood pressure of 145/84, pulse of 79.   Offer to talk to patient about her medications and resources. Patient asks if I might call her back to talk about these things next week.  PLAN: Will call to follow up with Joan Elliott as scheduled with patient on 04/25/16 at [place], PharmD Clinical Pharmacist [facility] [facility] [phone]

## 2016-04-22 NOTE — Patient Outreach (Signed)
Call to follow up with Ms. Joan Elliott. Left a HIPAA compliant message on the patient's voicemail. If have not heard from patient by next week, will give her another call at that time.  Harlow Asa, PharmD Clinical Pharmacist [facility] [phone]

## 2016-04-25 ENCOUNTER — Other Ambulatory Visit: Payer: Self-pay | Admitting: Pharmacist

## 2016-04-25 NOTE — Patient Outreach (Signed)
Call to follow up with Ms. Joan Elliott. HIPAA identifiers verified and verbal consent received.  Ms. Joan Elliott reports that she is doing well. Review with Ms. Joan Elliott her medications and update EPIC accordingly.   Discuss again with Ms. Joan Elliott the importance of medication adherence. Discuss with patient the use of [employer] mail order and [employer] OTC benefit for cost savings. Provide patient with the phone numbers for each. Patient states that she will call to get setup with mail order and the OTC benefit. Also discuss with patient the importance of budgeting ahead for her medication copayments. Patient confirms that she has her 3 week follow up visit scheduled with Dr. Criss Rosales and that she will be able to make this appointment.  Patient confirms that she has my phone number. Ask that she call for any medication questions or concerns.  Harlow Asa, PharmD Clinical Pharmacist [place] [facility] [phone]

## 2016-05-10 DIAGNOSIS — M25511 Pain in right shoulder: Secondary | ICD-10-CM | POA: Diagnosis not present

## 2016-05-10 DIAGNOSIS — I10 Essential (primary) hypertension: Secondary | ICD-10-CM | POA: Diagnosis not present

## 2016-05-10 DIAGNOSIS — M130 Polyarthritis, unspecified: Secondary | ICD-10-CM | POA: Diagnosis not present

## 2016-05-22 DIAGNOSIS — I10 Essential (primary) hypertension: Secondary | ICD-10-CM | POA: Diagnosis not present

## 2016-05-22 DIAGNOSIS — M12812 Other specific arthropathies, not elsewhere classified, left shoulder: Secondary | ICD-10-CM | POA: Diagnosis not present

## 2016-05-22 DIAGNOSIS — M25712 Osteophyte, left shoulder: Secondary | ICD-10-CM | POA: Diagnosis not present

## 2016-05-22 DIAGNOSIS — M25512 Pain in left shoulder: Secondary | ICD-10-CM | POA: Diagnosis not present

## 2016-06-22 DIAGNOSIS — I10 Essential (primary) hypertension: Secondary | ICD-10-CM | POA: Diagnosis not present

## 2016-06-22 DIAGNOSIS — M1710 Unilateral primary osteoarthritis, unspecified knee: Secondary | ICD-10-CM | POA: Diagnosis not present

## 2016-08-11 DIAGNOSIS — M549 Dorsalgia, unspecified: Secondary | ICD-10-CM | POA: Diagnosis not present

## 2016-08-11 DIAGNOSIS — Z79899 Other long term (current) drug therapy: Secondary | ICD-10-CM | POA: Diagnosis not present

## 2016-08-11 DIAGNOSIS — I110 Hypertensive heart disease with heart failure: Secondary | ICD-10-CM | POA: Diagnosis not present

## 2016-08-11 DIAGNOSIS — M1712 Unilateral primary osteoarthritis, left knee: Secondary | ICD-10-CM | POA: Diagnosis not present

## 2016-08-11 DIAGNOSIS — F331 Major depressive disorder, recurrent, moderate: Secondary | ICD-10-CM | POA: Diagnosis not present

## 2016-08-16 DIAGNOSIS — M25512 Pain in left shoulder: Secondary | ICD-10-CM | POA: Diagnosis not present

## 2016-08-17 DIAGNOSIS — Z1231 Encounter for screening mammogram for malignant neoplasm of breast: Secondary | ICD-10-CM | POA: Diagnosis not present

## 2016-09-02 DIAGNOSIS — I110 Hypertensive heart disease with heart failure: Secondary | ICD-10-CM | POA: Diagnosis not present

## 2016-09-02 DIAGNOSIS — E669 Obesity, unspecified: Secondary | ICD-10-CM | POA: Diagnosis not present

## 2016-09-02 DIAGNOSIS — G629 Polyneuropathy, unspecified: Secondary | ICD-10-CM | POA: Diagnosis not present

## 2016-09-02 DIAGNOSIS — E039 Hypothyroidism, unspecified: Secondary | ICD-10-CM | POA: Diagnosis not present

## 2016-09-26 DIAGNOSIS — M545 Low back pain: Secondary | ICD-10-CM | POA: Diagnosis not present

## 2016-09-26 DIAGNOSIS — N189 Chronic kidney disease, unspecified: Secondary | ICD-10-CM | POA: Diagnosis not present

## 2016-09-26 DIAGNOSIS — J449 Chronic obstructive pulmonary disease, unspecified: Secondary | ICD-10-CM | POA: Diagnosis not present

## 2016-09-26 DIAGNOSIS — I10 Essential (primary) hypertension: Secondary | ICD-10-CM | POA: Diagnosis not present

## 2016-10-05 ENCOUNTER — Ambulatory Visit (HOSPITAL_COMMUNITY)
Admission: EM | Admit: 2016-10-05 | Discharge: 2016-10-05 | Disposition: A | Discharge status: Home / Self Care | Payer: Medicare HMO | Attending: Family Medicine | Admitting: Family Medicine

## 2016-10-05 ENCOUNTER — Encounter (HOSPITAL_COMMUNITY): Payer: Self-pay | Admitting: Family Medicine

## 2016-10-05 DIAGNOSIS — G43809 Other migraine, not intractable, without status migrainosus: Secondary | ICD-10-CM | POA: Diagnosis not present

## 2016-10-05 DIAGNOSIS — G44209 Tension-type headache, unspecified, not intractable: Secondary | ICD-10-CM

## 2016-10-05 MED ORDER — ONDANSETRON 4 MG PO TBDP
4.0000 mg | ORAL_TABLET | Freq: Once | ORAL | Status: AC
  Administered 2016-10-05: 4 mg via ORAL

## 2016-10-05 MED ORDER — METHYLPREDNISOLONE ACETATE 80 MG/ML IJ SUSP
80.0000 mg | Freq: Once | INTRAMUSCULAR | Status: AC
  Administered 2016-10-05: 80 mg via INTRAMUSCULAR

## 2016-10-05 MED ORDER — KETOROLAC TROMETHAMINE 30 MG/ML IJ SOLN
30.0000 mg | Freq: Once | INTRAMUSCULAR | Status: AC
  Administered 2016-10-05: 30 mg via INTRAMUSCULAR

## 2016-10-05 MED ORDER — ONDANSETRON 4 MG PO TBDP
ORAL_TABLET | ORAL | Status: AC
  Filled 2016-10-05: qty 1

## 2016-10-05 MED ORDER — SUMATRIPTAN SUCCINATE 100 MG PO TABS: 100.0000 mg | ORAL_TABLET | ORAL | 0 refills | Status: DC | PRN

## 2016-10-05 MED ORDER — METHYLPREDNISOLONE ACETATE 80 MG/ML IJ SUSP
INTRAMUSCULAR | Status: AC
  Filled 2016-10-05: qty 1

## 2016-10-05 MED ORDER — KETOROLAC TROMETHAMINE 60 MG/2ML IM SOLN: 60.0000 mg | Freq: Once | INTRAMUSCULAR | Status: DC

## 2016-10-05 MED ORDER — KETOROLAC TROMETHAMINE 30 MG/ML IJ SOLN
INTRAMUSCULAR | Status: AC
  Filled 2016-10-05: qty 1

## 2016-10-05 MED ORDER — METHYLPREDNISOLONE SODIUM SUCC 125 MG IJ SOLR: 80.0000 mg | Freq: Once | INTRAMUSCULAR | Status: DC

## 2016-10-05 NOTE — ED Provider Notes (Signed)
CSN: [id number]     Arrival date & time 10/05/16  1015 History   First MD Initiated Contact with Patient 10/05/16 1053     Chief Complaint  Patient presents with  . Headache   (Consider location/radiation/quality/duration/timing/severity/associated sxs/prior Treatment) Patient c/o tension headache with migraine qualities.  Patient c/o phonophotobia.  Patient c/o nausea.   The history is provided by the patient.  Headache  Pain location:  Frontal Quality:  Dull Severity currently:  5/10 Severity at highest:  9/10 Onset quality:  Sudden Duration:  2 days Timing:  Constant Progression:  Worsening Chronicity:  New Similar to prior headaches: yes   Context: bright light and loud noise   Relieved by:  None tried Worsened by:  Activity, light, neck movement and sound Associated symptoms: myalgias and nausea     Past Medical History:  Diagnosis Date  . Coronary artery disease    s/p stent mid RCA 2003  . GERD (gastroesophageal reflux disease)   . Gout   . HLD (hyperlipidemia)   . Hypertension   . Hypothyroidism   . Thyroid disease   . Tobacco abuse    Past Surgical History:  Procedure Laterality Date  . ABDOMINAL HYSTERECTOMY    . THYROID SURGERY     Family History  Problem Relation Age of Onset  . Hypertension Mother   . Kidney disease Mother   . Hypertension Brother   . Hypertension Sister    Social History  Substance Use Topics  . Smoking status: Current Some Day Smoker    Packs/day: 0.50    Types: Cigarettes  . Smokeless tobacco: Never Used  . Alcohol use No   OB History    No data available     Review of Systems  Constitutional: Negative.   HENT: Negative.   Eyes: Negative.   Respiratory: Negative.   Cardiovascular: Negative.   Gastrointestinal: Positive for nausea.  Endocrine: Negative.   Genitourinary: Negative.   Musculoskeletal: Positive for myalgias.  Allergic/Immunologic: Negative.   Neurological: Positive for headaches.  Hematological:  Negative.     Allergies  Ivp dye [iodinated diagnostic agents] and Penicillins  Home Medications   Prior to Admission medications   Medication Sig Start Date End Date Taking? Authorizing Provider  albuterol (PROVENTIL HFA;VENTOLIN HFA) 108 (90 BASE) MCG/ACT inhaler Inhale 2 puffs into the lungs every 6 (six) hours as needed for wheezing or shortness of breath.    Historical Provider, MD  allopurinol (ZYLOPRIM) 100 MG tablet Take 100 mg by mouth daily. 02/02/15   Historical Provider, MD  amLODipine (NORVASC) 5 MG tablet Take 5 mg by mouth 2 (two) times daily.    Historical Provider, MD  clopidogrel (PLAVIX) 75 MG tablet Take 75 mg by mouth daily.    Historical Provider, MD  diphenhydrAMINE (BENADRYL) 25 MG tablet Take 25 mg by mouth at bedtime as needed for itching.    Historical Provider, MD  Fluticasone-Salmeterol (ADVAIR) 250-50 MCG/DOSE AEPB Inhale 2 puffs into the lungs daily as needed (for wheezing or shortness of breath).    Historical Provider, MD  levothyroxine (SYNTHROID, LEVOTHROID) 50 MCG tablet Take 50 mcg by mouth daily. 02/02/15   Historical Provider, MD  losartan-hydrochlorothiazide (HYZAAR) 100-12.5 MG tablet Take 1 tablet by mouth daily.    Historical Provider, MD  lovastatin (MEVACOR) 40 MG tablet Take 40 mg by mouth at bedtime. 12/24/14   Historical Provider, MD  methocarbamol (ROBAXIN) 500 MG tablet Take 500 mg by mouth 2 (two) times daily as needed for muscle  spasms.  10/06/15   Historical Provider, MD  naproxen sodium (ALEVE) 220 MG tablet Take 440 mg by mouth 2 (two) times daily as needed (FOR PAIN).     Historical Provider, MD  nebivolol (BYSTOLIC) 10 MG tablet Take 10 mg by mouth daily.    Historical Provider, MD  pantoprazole (PROTONIX) 40 MG tablet Take 40 mg by mouth daily. 02/02/15   Historical Provider, MD  SUMAtriptan (IMITREX) 100 MG tablet Take 1 tablet (100 mg total) by mouth every 2 (two) hours as needed for migraine. May repeat in 2 hours if headache persists or  recurs. 10/05/16   [name], FNP   Meds Ordered and Administered this Visit   Medications  ondansetron (ZOFRAN-ODT) disintegrating tablet 4 mg (4 mg Oral Given 10/05/16 1116)  ketorolac (TORADOL) 30 MG/ML injection 30 mg (30 mg Intramuscular Given 10/05/16 1116)  methylPREDNISolone acetate (DEPO-MEDROL) injection 80 mg (80 mg Intramuscular Given 10/05/16 1118)    BP 118/68   Pulse 97   Temp 98.3 F (36.8 C)   Resp 18   SpO2 98%  No data found.   Physical Exam  Constitutional: She appears well-developed and well-nourished.  HENT:  Head: Normocephalic and atraumatic.  Eyes: Conjunctivae and EOM are normal. Pupils are equal, round, and reactive to light.  Neck: Normal range of motion. Neck supple.  Cardiovascular: Normal rate, regular rhythm and normal heart sounds.   Pulmonary/Chest: Effort normal and breath sounds normal.  Musculoskeletal: She exhibits tenderness.  TTP cervical paraspinous muscles and TTP myofascial region.  Nursing note and vitals reviewed.   Urgent Care Course     Procedures (including critical care time)  Labs Review Labs Reviewed - No data to display  Imaging Review No results found.   Visual Acuity Review  Right Eye Distance:   Left Eye Distance:   Bilateral Distance:    Right Eye Near:   Left Eye Near:    Bilateral Near:         MDM   1. Other migraine without status migrainosus, not intractable   2. Tension headache    Depo Medrol 80mg  IM Toradol 30mg IM Zofran ODT 4mg  po now  Rx for imitrex 100mg   Follow up prn.      [name], FNP 10/05/16 1200

## 2016-10-05 NOTE — ED Triage Notes (Signed)
Pt here with 3 days of headache and photophobia,

## 2016-10-06 DIAGNOSIS — I10 Essential (primary) hypertension: Secondary | ICD-10-CM | POA: Diagnosis not present

## 2016-10-06 DIAGNOSIS — E039 Hypothyroidism, unspecified: Secondary | ICD-10-CM | POA: Diagnosis not present

## 2016-10-06 DIAGNOSIS — I129 Hypertensive chronic kidney disease with stage 1 through stage 4 chronic kidney disease, or unspecified chronic kidney disease: Secondary | ICD-10-CM | POA: Diagnosis not present

## 2016-10-06 DIAGNOSIS — G4762 Sleep related leg cramps: Secondary | ICD-10-CM | POA: Diagnosis not present

## 2016-10-06 DIAGNOSIS — G629 Polyneuropathy, unspecified: Secondary | ICD-10-CM | POA: Diagnosis not present

## 2016-10-06 DIAGNOSIS — N189 Chronic kidney disease, unspecified: Secondary | ICD-10-CM | POA: Diagnosis not present

## 2016-11-07 DIAGNOSIS — I10 Essential (primary) hypertension: Secondary | ICD-10-CM | POA: Diagnosis not present

## 2016-11-07 DIAGNOSIS — J449 Chronic obstructive pulmonary disease, unspecified: Secondary | ICD-10-CM | POA: Diagnosis not present

## 2016-11-07 DIAGNOSIS — N3001 Acute cystitis with hematuria: Secondary | ICD-10-CM | POA: Diagnosis not present

## 2016-11-07 DIAGNOSIS — Z131 Encounter for screening for diabetes mellitus: Secondary | ICD-10-CM | POA: Diagnosis not present

## 2016-11-07 DIAGNOSIS — M109 Gout, unspecified: Secondary | ICD-10-CM | POA: Diagnosis not present

## 2016-11-07 DIAGNOSIS — Z13220 Encounter for screening for lipoid disorders: Secondary | ICD-10-CM | POA: Diagnosis not present

## 2016-11-07 DIAGNOSIS — J302 Other seasonal allergic rhinitis: Secondary | ICD-10-CM | POA: Diagnosis not present

## 2016-11-07 DIAGNOSIS — E559 Vitamin D deficiency, unspecified: Secondary | ICD-10-CM | POA: Diagnosis not present

## 2016-11-07 DIAGNOSIS — E038 Other specified hypothyroidism: Secondary | ICD-10-CM | POA: Diagnosis not present

## 2016-11-07 DIAGNOSIS — M19019 Primary osteoarthritis, unspecified shoulder: Secondary | ICD-10-CM | POA: Diagnosis not present

## 2017-01-23 DIAGNOSIS — M179 Osteoarthritis of knee, unspecified: Secondary | ICD-10-CM | POA: Diagnosis not present

## 2017-01-23 DIAGNOSIS — I10 Essential (primary) hypertension: Secondary | ICD-10-CM | POA: Diagnosis not present

## 2017-01-23 DIAGNOSIS — E038 Other specified hypothyroidism: Secondary | ICD-10-CM | POA: Diagnosis not present

## 2017-01-23 DIAGNOSIS — M19019 Primary osteoarthritis, unspecified shoulder: Secondary | ICD-10-CM | POA: Diagnosis not present

## 2017-01-23 DIAGNOSIS — J440 Chronic obstructive pulmonary disease with acute lower respiratory infection: Secondary | ICD-10-CM | POA: Diagnosis not present

## 2017-02-27 ENCOUNTER — Ambulatory Visit (INDEPENDENT_AMBULATORY_CARE_PROVIDER_SITE_OTHER): Payer: Self-pay | Admitting: Orthopaedic Surgery

## 2017-03-07 ENCOUNTER — Ambulatory Visit (INDEPENDENT_AMBULATORY_CARE_PROVIDER_SITE_OTHER): Payer: Medicare HMO

## 2017-03-07 ENCOUNTER — Other Ambulatory Visit (INDEPENDENT_AMBULATORY_CARE_PROVIDER_SITE_OTHER): Payer: Self-pay | Admitting: Orthopaedic Surgery

## 2017-03-07 ENCOUNTER — Ambulatory Visit (INDEPENDENT_AMBULATORY_CARE_PROVIDER_SITE_OTHER): Payer: Medicare HMO | Admitting: Orthopaedic Surgery

## 2017-03-07 ENCOUNTER — Telehealth (INDEPENDENT_AMBULATORY_CARE_PROVIDER_SITE_OTHER): Payer: Self-pay | Admitting: Radiology

## 2017-03-07 DIAGNOSIS — M25512 Pain in left shoulder: Secondary | ICD-10-CM | POA: Diagnosis not present

## 2017-03-07 DIAGNOSIS — G8929 Other chronic pain: Secondary | ICD-10-CM

## 2017-03-07 DIAGNOSIS — M25511 Pain in right shoulder: Secondary | ICD-10-CM

## 2017-03-07 DIAGNOSIS — M542 Cervicalgia: Secondary | ICD-10-CM

## 2017-03-07 MED ORDER — METHYLPREDNISOLONE 4 MG PO TABS: ORAL_TABLET | ORAL | 0 refills | Status: DC

## 2017-03-07 MED ORDER — TIZANIDINE HCL 4 MG PO TABS: 4.0000 mg | ORAL_TABLET | Freq: Two times a day (BID) | ORAL | 0 refills | Status: DC | PRN

## 2017-03-07 NOTE — Telephone Encounter (Signed)
Calling to let Dr. Ninfa Linden know they do not have MRI report on this patient. Patient was seen in office this morning by Dr. Ninfa Linden. They were done by [facility]. Spoke with Dr. Ninfa Linden who advised he did not get results, but they were not needed, after doing physical examination pain was elsewhere and reports were not necessary to proceed.

## 2017-03-07 NOTE — Progress Notes (Signed)
Office Visit Note   Patient: Joan Elliott           Date of Birth: [birth year]           MRN: [id number] Visit Date: 03/07/2017              Requested by: [name], MD [address] [place], [place] [address] PCP: Lucianne Lei, MD   Assessment & Plan: Visit Diagnoses:  1. Neck pain   2. Chronic right shoulder pain   3. Chronic left shoulder pain     Plan:She is someone who will definitely benefit from outpatient physical therapy. She needs therapy to work on modalities that can decrease her muscle pain in her thoracic and lumbar spine as well as her trapezius and cervical spine areas. I don't think she would benefit from any type of steroid injection based on my exam and her clinical signs and symptoms. I will put her on a six-day steroid taper and try Zanaflex as a muscle relaxant. All questions are encouraged and answered. She is amenable to trying therapy working on any modalities to improve her function and decrease her pain. They may want to consider a TENS unit for her. I'll reevaluate her in 6 weeks to see how she is doing overall.  Follow-Up Instructions: Return in about 6 weeks (around 04/18/2017).   Orders:  Orders Placed This Encounter  Procedures  . XR Cervical Spine 2 or 3 views  . XR Shoulder Right   Meds ordered this encounter  Medications  . methylPREDNISolone (MEDROL) 4 MG tablet    Sig: Medrol dose pack. Take as instructed    Dispense:  21 tablet    Refill:  0  . tiZANidine (ZANAFLEX) 4 MG tablet    Sig: Take 1 tablet (4 mg total) by mouth 2 (two) times daily as needed for muscle spasms.    Dispense:  60 tablet    Refill:  0      Procedures: No procedures performed   Clinical Data: No additional findings.   Subjective: No chief complaint on file. The patient is very pleasant 76 year old female who uses a rolling walker. She comes in with chief complaint of neck pain and bilateral shoulder pain as well as back pain. She denies any pain in  her groin at all. She does get numbness and tingling in both feet and she says she is not a diabetic. This minimal for long period time. She says that she is too old to deal with it and it does wake her up at night in terms of some pain in her shoulders. She cannot take anti-inflammatories due to kidney problems and narcotics do cause significant constipation. She was told at one time she may need an epidural steroid injection but this is apparently ordered a [facility] within never done because they said that MRI to ensure reason to have an injection apparently. The pain is worse at night. It is detrimentally affected activities daily living, her quality of life, and her mobility. He can be 10 out of 10 at times and intense.  HPI  Review of Systems She currently denies any headache, chest pain, shortness of breath, fever, chills, nausea, vomiting.  Objective: Vital Signs: There were no vitals taken for this visit.  Physical Exam She is alert and oriented 3 and in no acute distress Ortho Exam I can easily move both shoulders without difficulty. Her pain seems to be more in the midline cervical spine into the trapezius area  on both sides. She has good range of motion of her cervical spine but is deathly painful to her. She has good grip strength bilaterally in the numbness in her hands. She does put a lot of pressure through her shoulders as she uses her rolling walker but again the shoulders well located clinically. Examination of her left spine show significant truncal obesity. Her pain seems to be to the right side in the paraspinal muscles more of the lower thoracic and upper lumbar spine area. Both hips move fluidly with no pain in the groin at all. Specialty Comments:  No specialty comments available.  Imaging: Xr Cervical Spine 2 Or 3 Views  Result Date: 03/07/2017 2 views of the cervical spine show straightening of the spine and chronic degenerative changes at multiple  levels.  Xr Shoulder Right  Result Date: 03/07/2017 Several views of the right shoulder obtained and show well located shoulder with some slight high riding of the humeral head and the glenoid suggesting chronic rotator cuff issues. 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 There is no acute findings.    PMFS History: Patient Active Problem List   Diagnosis Date Noted  . Neck pain 03/07/2017  . Chronic right shoulder pain 03/07/2017  . Chronic left shoulder pain 03/07/2017  . Chest pain, rule out acute myocardial infarction   . Chest pain 02/23/2015  . Hypokalemia 02/23/2015  . AKI (acute kidney injury) ([place]) 02/23/2015  . Diarrhea 02/23/2015  . Hypertension   . Coronary artery disease   . Thyroid disease   . Tobacco abuse   . Hypothyroidism   . HLD (hyperlipidemia)   . Gout   . GERD (gastroesophageal reflux disease)    Past Medical History:  Diagnosis Date  . Coronary artery disease    s/p stent mid RCA 2003  . GERD (gastroesophageal reflux disease)   . Gout   . HLD (hyperlipidemia)   . Hypertension   . Hypothyroidism   . Thyroid disease   . Tobacco abuse     Family History  Problem Relation Age of Onset  . Hypertension Mother   . Kidney disease Mother   . Hypertension Brother   . Hypertension Sister     Past Surgical History:  Procedure Laterality Date  . ABDOMINAL HYSTERECTOMY    . THYROID SURGERY     Social History   Occupational History  . Not on file.   Social History Main Topics  . Smoking status: Current Some Day Smoker    Packs/day: 0.50    Types: Cigarettes  . Smokeless tobacco: Never Used  . Alcohol use No  . Drug use: No  . Sexual activity: Not on file

## 2017-03-07 NOTE — Telephone Encounter (Signed)
Please advise 

## 2017-03-11 ENCOUNTER — Encounter (HOSPITAL_COMMUNITY): Payer: Self-pay

## 2017-03-11 ENCOUNTER — Emergency Department (HOSPITAL_COMMUNITY): Payer: Medicare HMO

## 2017-03-11 ENCOUNTER — Emergency Department (HOSPITAL_COMMUNITY)
Admission: EM | Admit: 2017-03-11 | Discharge: 2017-03-11 | Disposition: A | Discharge status: Home / Self Care | Payer: Medicare HMO | Attending: Emergency Medicine | Admitting: Emergency Medicine

## 2017-03-11 DIAGNOSIS — I2510 Atherosclerotic heart disease of native coronary artery without angina pectoris: Secondary | ICD-10-CM | POA: Insufficient documentation

## 2017-03-11 DIAGNOSIS — R42 Dizziness and giddiness: Secondary | ICD-10-CM | POA: Diagnosis not present

## 2017-03-11 DIAGNOSIS — E039 Hypothyroidism, unspecified: Secondary | ICD-10-CM | POA: Diagnosis not present

## 2017-03-11 DIAGNOSIS — F17210 Nicotine dependence, cigarettes, uncomplicated: Secondary | ICD-10-CM | POA: Diagnosis not present

## 2017-03-11 DIAGNOSIS — E860 Dehydration: Secondary | ICD-10-CM | POA: Diagnosis not present

## 2017-03-11 DIAGNOSIS — I10 Essential (primary) hypertension: Secondary | ICD-10-CM | POA: Diagnosis not present

## 2017-03-11 DIAGNOSIS — N179 Acute kidney failure, unspecified: Secondary | ICD-10-CM | POA: Diagnosis not present

## 2017-03-11 HISTORY — DX: Unspecified osteoarthritis, unspecified site: M19.90

## 2017-03-11 LAB — URINALYSIS, ROUTINE W REFLEX MICROSCOPIC
Bilirubin Urine: NEGATIVE
GLUCOSE, UA: NEGATIVE mg/dL
HGB URINE DIPSTICK: NEGATIVE
Ketones, ur: NEGATIVE mg/dL
NITRITE: NEGATIVE
PH: 5 (ref 5.0–8.0)
Protein, ur: NEGATIVE mg/dL
RBC / HPF: NONE SEEN RBC/hpf (ref 0–5)
SPECIFIC GRAVITY, URINE: 1.011 (ref 1.005–1.030)
Squamous Epithelial / HPF: NONE SEEN

## 2017-03-11 LAB — PROTIME-INR
INR: 1
Prothrombin Time: 13.1 seconds (ref 11.4–15.2)

## 2017-03-11 LAB — DIFFERENTIAL
BASOS ABS: 0 10*3/uL (ref 0.0–0.1)
Basophils Relative: 0 %
EOS ABS: 0 10*3/uL (ref 0.0–0.7)
Eosinophils Relative: 0 %
LYMPHS ABS: 2.3 10*3/uL (ref 0.7–4.0)
Lymphocytes Relative: 30 %
MONO ABS: 0.4 10*3/uL (ref 0.1–1.0)
Monocytes Relative: 5 %
NEUTROS PCT: 65 %
Neutro Abs: 4.9 10*3/uL (ref 1.7–7.7)

## 2017-03-11 LAB — COMPREHENSIVE METABOLIC PANEL
ALBUMIN: 3.8 g/dL (ref 3.5–5.0)
ALT: 18 U/L (ref 14–54)
ANION GAP: 11 (ref 5–15)
AST: 23 U/L (ref 15–41)
Alkaline Phosphatase: 85 U/L (ref 38–126)
BILIRUBIN TOTAL: 0.2 mg/dL — AB (ref 0.3–1.2)
BUN: 23 mg/dL — AB (ref 6–20)
CO2: 22 mmol/L (ref 22–32)
Calcium: 9.3 mg/dL (ref 8.9–10.3)
Chloride: 105 mmol/L (ref 101–111)
Creatinine, Ser: 1.66 mg/dL — ABNORMAL HIGH (ref 0.44–1.00)
GFR calc Af Amer: 33 mL/min — ABNORMAL LOW (ref >60)
GFR calc non Af Amer: 28 mL/min — ABNORMAL LOW (ref >60)
GLUCOSE: 157 mg/dL — AB (ref 65–99)
POTASSIUM: 3.8 mmol/L (ref 3.5–5.1)
Sodium: 138 mmol/L (ref 135–145)
TOTAL PROTEIN: 7.2 g/dL (ref 6.5–8.1)

## 2017-03-11 LAB — I-STAT TROPONIN, ED: TROPONIN I, POC: 0.02 ng/mL (ref 0.00–0.08)

## 2017-03-11 LAB — CBC
HCT: 38.8 % (ref 36.0–46.0)
Hemoglobin: 13.2 g/dL (ref 12.0–15.0)
MCH: 27.8 pg (ref 26.0–34.0)
MCHC: 34 g/dL (ref 30.0–36.0)
MCV: 81.9 fL (ref 78.0–100.0)
PLATELETS: 288 10*3/uL (ref 150–400)
RBC: 4.74 MIL/uL (ref 3.87–5.11)
RDW: 14.7 % (ref 11.5–15.5)
WBC: 7.6 10*3/uL (ref 4.0–10.5)

## 2017-03-11 LAB — I-STAT CHEM 8, ED
BUN: 27 mg/dL — AB (ref 6–20)
CALCIUM ION: 1.09 mmol/L — AB (ref 1.15–1.40)
Chloride: 104 mmol/L (ref 101–111)
Creatinine, Ser: 1.6 mg/dL — ABNORMAL HIGH (ref 0.44–1.00)
Glucose, Bld: 149 mg/dL — ABNORMAL HIGH (ref 65–99)
HEMATOCRIT: 40 % (ref 36.0–46.0)
Hemoglobin: 13.6 g/dL (ref 12.0–15.0)
Potassium: 3.8 mmol/L (ref 3.5–5.1)
SODIUM: 138 mmol/L (ref 135–145)
TCO2: 25 mmol/L (ref 22–32)

## 2017-03-11 LAB — CBG MONITORING, ED: Glucose-Capillary: 105 mg/dL — ABNORMAL HIGH (ref 65–99)

## 2017-03-11 LAB — APTT: APTT: 27 s (ref 24–36)

## 2017-03-11 MED ORDER — SODIUM CHLORIDE 0.9 % IV BOLUS (SEPSIS)
1000.0000 mL | Freq: Once | INTRAVENOUS | Status: AC
  Administered 2017-03-11: 1000 mL via INTRAVENOUS

## 2017-03-11 NOTE — ED Notes (Signed)
Patient transported to CT 

## 2017-03-11 NOTE — ED Triage Notes (Signed)
Per t, Pt was sitting in her house when she started to have "hot feeling" to her right face and right leg with tingliing. Pt reports some throat soreness and some trouble getting her words out. Pt alert and oriented x4 upon arrival.

## 2017-03-11 NOTE — ED Notes (Signed)
MD Vanita Panda in the room.

## 2017-03-11 NOTE — ED Provider Notes (Signed)
Emergency Department Provider Note   I have reviewed the triage vital signs and the nursing notes.   HISTORY  Chief Complaint Extremity Weakness   HPI Joan Elliott is a 76 y.o. female past medical history of coronary artery disease, hypertension, hyperlipidemia who presents emergency Department with dizziness. Chief complaints as extremity weakness and the triage note described extremity weakness and difficulty speaking the patient states that she was just very dizzy. At one point she felt like she was going to pass out if she stood up any further. While she was having this dizziness she spoke to her son on the phone and the son said that she was speaking normally she was just confused as to why she is feeling so bad. She states she's had TIAs in the past however they were different than this and involved weakness of an extremity or with speaking.this progressively improved about, so she was symptom-free. She doesn't state any recent illnesses. She has no urinary symptoms, gastrointestinal symptoms, chest pain or back pain that is new. She does have chronic pain however nothing above and beyond that. She does urinate a lot but no pain, malodor, discoloration.   Past Medical History:  Diagnosis Date  . Arthritis   . Coronary artery disease    s/p stent mid RCA 2003  . GERD (gastroesophageal reflux disease)   . Gout   . HLD (hyperlipidemia)   . Hypertension   . Hypothyroidism   . Thyroid disease   . Tobacco abuse     Patient Active Problem List   Diagnosis Date Noted  . Neck pain 03/07/2017  . Chronic right shoulder pain 03/07/2017  . Chronic left shoulder pain 03/07/2017  . Chest pain, rule out acute myocardial infarction   . Chest pain 02/23/2015  . Hypokalemia 02/23/2015  . AKI (acute kidney injury) ([place]) 02/23/2015  . Diarrhea 02/23/2015  . Hypertension   . Coronary artery disease   . Thyroid disease   . Tobacco abuse   . Hypothyroidism   . HLD (hyperlipidemia)     . Gout   . GERD (gastroesophageal reflux disease)     Past Surgical History:  Procedure Laterality Date  . ABDOMINAL HYSTERECTOMY    . THYROID SURGERY      Current Outpatient Rx  . Order #: [id number] Class: Historical Med  . Order #: [id number] Class: Historical Med  . Order #: [id number] Class: Historical Med  . Order #: [id number] Class: Historical Med  . Order #: [id number] Class: Historical Med  . Order #: [id number] Class: Historical Med  . Order #: [id number] Class: Historical Med  . Order #: [id number] Class: Historical Med  . Order #: [id number] Class: Historical Med  . Order #: [id number] Class: Historical Med  . Order #: [id number] Class: Historical Med  . Order #: [id number] Class: Normal  . Order #: [id number] Class: Historical Med  . Order #: [id number] Class: Normal  . Order #: [id number] Class: Normal  . Order #: [id number] Class: Historical Med    Allergies Ivp dye [iodinated diagnostic agents] and Penicillins  Family History  Problem Relation Age of Onset  . Hypertension Mother   . Kidney disease Mother   . Hypertension Brother   . Hypertension Sister     Social History Social History  Substance Use Topics  . Smoking status: Current Some Day Smoker    Packs/day: 0.50    Types: Cigarettes  . Smokeless tobacco: Never Used  . Alcohol use No    Review of Systems  All other systems negative except as documented in the HPI. All  pertinent positives and negatives as reviewed in the HPI. ____________________________________________  PHYSICAL EXAM:  VITAL SIGNS: ED Triage Vitals [03/11/17 1506]  Enc Vitals Group     BP 133/79     Pulse Rate 98     Resp 18     Temp 98.3 F (36.8 C)     Temp Source Oral     SpO2 100 %     Weight 207 lb (93.9 kg)     Height 5' (1.524 m)     Head Circumference      Peak Flow      Pain Score 8     Pain Loc      Pain Edu?      Excl. in [place]?     Constitutional: Alert and oriented. Well appearing and in no acute distress. Eyes:  Conjunctivae are normal. PERRL. EOMI. Head: Atraumatic. Nose: No congestion/rhinnorhea. Mouth/Throat: Mucous membranes are moist.  Oropharynx non-erythematous. Neck: No stridor.  No meningeal signs.   Cardiovascular: Normal rate, regular rhythm. Good peripheral circulation. Grossly normal heart sounds.   Respiratory: Normal respiratory effort.  No retractions. Lungs CTAB. Gastrointestinal: Soft and nontender. No distention.  Musculoskeletal: No lower extremity tenderness nor edema. No gross deformities of extremities. Neurologic:  Normal speech and language. No gross focal neurologic deficits are appreciated. No altered mental status, able to give full seemingly accurate history.  Face is symmetric, EOM's intact, pupils equal and reactive, vision intact, tongue and uvula midline without deviation. Upper and Lower extremity motor 5/5, intact pain perception in distal extremities, 2+ reflexes in biceps, patella and achilles tendons. Able to perform finger to nose normal with both hands.  Skin:  Skin is warm, dry and intact. No rash noted.  ____________________________________________   LABS (all labs ordered are listed, but only abnormal results are displayed)  Labs Reviewed  COMPREHENSIVE METABOLIC PANEL - Abnormal; Notable for the following:       Result Value   Glucose, Bld 157 (*)    BUN 23 (*)    Creatinine, Ser 1.66 (*)    Total Bilirubin 0.2 (*)    GFR calc non Af Amer 28 (*)    GFR calc Af Amer 33 (*)    All other components within normal limits  URINALYSIS, ROUTINE W REFLEX MICROSCOPIC - Abnormal; Notable for the following:    Color, Urine STRAW (*)    Leukocytes, UA TRACE (*)    Bacteria, UA RARE (*)    All other components within normal limits  CBG MONITORING, ED - Abnormal; Notable for the following:    Glucose-Capillary 105 (*)    All other components within normal limits  I-STAT CHEM 8, ED - Abnormal; Notable for the following:    BUN 27 (*)    Creatinine, Ser 1.60  (*)    Glucose, Bld 149 (*)    Calcium, Ion 1.09 (*)    All other components within normal limits  PROTIME-INR  APTT  CBC  DIFFERENTIAL  I-STAT TROPONIN, ED   ____________________________________________  EKG   EKG Interpretation  Date/Time:  Saturday March 11 2017 15:44:34 EDT Ventricular Rate:  80 PR Interval:    QRS Duration: 115 QT Interval:  364 QTC Calculation: 420 R Axis:   -53 Text Interpretation:  Sinus rhythm Probable left atrial enlargement Incomplete left bundle branch block Left ventricular hypertrophy Anterior Q waves, possibly due to LVH, not new No acute changes Confirmed by Merrily Pew [phone]) on 03/11/2017 3:59:01 PM      ____________________________________________  RADIOLOGY  Dg Chest 2 View  Result Date: 03/11/2017 CLINICAL DATA:  Dizziness for 2 days. History of hypertension, coronary artery disease, tobacco abuse. EXAM: CHEST  2 VIEW COMPARISON:  Chest x-ray dated 03/20/2016. FINDINGS: Heart size is upper normal. Atherosclerotic changes noted at the aortic arch. Suspect chronic bronchitic changes centrally. Lungs appear otherwise clear. No confluent opacity to suggest a consolidating pneumonia. No pleural effusion or pneumothorax seen. Degenerative spurring throughout the kyphotic thoracic spine. No acute or suspicious osseous finding. IMPRESSION: 1. No active cardiopulmonary disease. No evidence of pneumonia or pulmonary edema. 2. Probable chronic bronchitic changes. 3. Aortic atherosclerosis. Electronically Signed   By: Franki Cabot M.D.   On: 03/11/2017 18:22   Ct Head Wo Contrast  Result Date: 03/11/2017 CLINICAL DATA:  Headache EXAM: CT HEAD WITHOUT CONTRAST TECHNIQUE: Contiguous axial images were obtained from the base of the skull through the vertex without intravenous contrast. COMPARISON:  None. FINDINGS: Brain: No acute territorial infarction, hemorrhage, or intracranial mass is seen. Patchy periventricular hypodensity in the white matter  consistent with small vessel ischemic changes. Mild atrophy. Nonenlarged ventricles. Vascular: No hyperdense vessels.  Carotid artery calcifications. Skull: No fracture or suspicious bone lesion. Sinuses/Orbits: Mild mucosal thickening in the ethmoid sinuses. No acute orbital abnormality. Other: None IMPRESSION: 1. No CT evidence for acute intracranial abnormality. 2. Atrophy and mild small vessel ischemic changes of the white matter Electronically Signed   By: Donavan Foil M.D.   On: 03/11/2017 16:11   ____________________________________________   PROCEDURES  Procedure(s) performed:   Procedures ____________________________________________   INITIAL IMPRESSION / ASSESSMENT AND PLAN / ED COURSE  Pertinent labs & imaging results that were available during my care of the patient were reviewed by me and considered in my medical decision making (see chart for details).  Suspect slight dehydration as her kidney function is decreased and it does not sound like she drinks a lot of water.CT scan negative [place] intracranial bleed. If her symptoms don't continue to be resolved after some fluids we'll consider doing an MRI.  Symptoms significantly improved. Ambulated through department multiple times without difficulty. Stable for discharge for likely dehydration.   ____________________________________________  FINAL CLINICAL IMPRESSION(S) / ED DIAGNOSES  Final diagnoses:  Dizziness  Dehydration    MEDICATIONS GIVEN DURING THIS VISIT:  Medications  sodium chloride 0.9 % bolus 1,000 mL (0 mLs Intravenous Stopped 03/11/17 1826)    NEW OUTPATIENT MEDICATIONS STARTED DURING THIS VISIT:  Discharge Medication List as of 03/11/2017  7:51 PM      Note:  This document was prepared using Dragon voice recognition software and may include unintentional dictation errors.    Sully Manzi, Corene Cornea, MD 03/12/17 [phone]

## 2017-04-18 ENCOUNTER — Ambulatory Visit (INDEPENDENT_AMBULATORY_CARE_PROVIDER_SITE_OTHER): Payer: Medicare HMO | Admitting: Orthopaedic Surgery

## 2017-04-18 ENCOUNTER — Other Ambulatory Visit: Payer: Self-pay

## 2017-04-18 DIAGNOSIS — I739 Peripheral vascular disease, unspecified: Secondary | ICD-10-CM

## 2017-04-24 ENCOUNTER — Ambulatory Visit (INDEPENDENT_AMBULATORY_CARE_PROVIDER_SITE_OTHER): Payer: Medicare HMO

## 2017-04-24 ENCOUNTER — Ambulatory Visit (INDEPENDENT_AMBULATORY_CARE_PROVIDER_SITE_OTHER): Payer: Medicare HMO | Admitting: Orthopaedic Surgery

## 2017-04-24 DIAGNOSIS — M5441 Lumbago with sciatica, right side: Secondary | ICD-10-CM

## 2017-04-24 NOTE — Progress Notes (Signed)
The patient is someone that I did see in early September and he recommended physical therapy on her neck and her shoulder. Apparently she never went to physical therapy or nerve even got called from them. In the interim she is developing worsening low back pain that radiates down her right leg. She's a history of right knee replacement but says she gets a burning sensation going down her right backside to her foot. Apparently she's had an MRI of her lumbar spine that she brought in the last time but her pain was more neck and shoulder related. Again I feel that we had ordered physical therapy for her to work on a modalities to decrease her pain including considering a TENS unit. Again she says she was never called by therapy.  On exam she does have positive straight leg raise on the right side. She has numbness going down her right leg. X-rays of lumbar spine do confirm a grade 1 spondylolisthesis at L4-L5. The disc heights are otherwise well maintained given her age of 76 years old.  This point it is essentially get her into physical therapy for her neck, her shoulders, and especially her lumbar spine. They can work on a modalities to improve her function and decrease her pain. They need to definitely consider TENS unit as well.

## 2017-04-25 ENCOUNTER — Other Ambulatory Visit (INDEPENDENT_AMBULATORY_CARE_PROVIDER_SITE_OTHER): Payer: Self-pay

## 2017-04-25 DIAGNOSIS — M2550 Pain in unspecified joint: Secondary | ICD-10-CM

## 2017-05-02 ENCOUNTER — Telehealth (INDEPENDENT_AMBULATORY_CARE_PROVIDER_SITE_OTHER): Payer: Self-pay | Admitting: Orthopaedic Surgery

## 2017-05-02 NOTE — Telephone Encounter (Signed)
Can you help me with this, I don't know how to check the "process" of this appointment. We sent referral fir her to go to PT a week ago today

## 2017-05-02 NOTE — Telephone Encounter (Signed)
Patient called stated has not hear from PT yet and been a week already. Wants to know if you can follow up with PT and let her know when they will contact her

## 2017-05-03 NOTE — Telephone Encounter (Signed)
I think you'd just need to call them

## 2017-05-03 NOTE — Telephone Encounter (Signed)
Sorry my message cut off, I would call the location to inquire, it looks as if it is in their WQ, or you could have the patient call them?

## 2017-05-08 NOTE — Telephone Encounter (Signed)
She has appt 05/15/17

## 2017-05-15 ENCOUNTER — Encounter: Payer: Self-pay | Admitting: Physical Therapy

## 2017-05-15 ENCOUNTER — Ambulatory Visit: Payer: Medicare HMO | Attending: Orthopaedic Surgery | Admitting: Physical Therapy

## 2017-05-15 DIAGNOSIS — G8929 Other chronic pain: Secondary | ICD-10-CM | POA: Diagnosis present

## 2017-05-15 DIAGNOSIS — M25512 Pain in left shoulder: Secondary | ICD-10-CM | POA: Insufficient documentation

## 2017-05-15 DIAGNOSIS — M25511 Pain in right shoulder: Secondary | ICD-10-CM | POA: Insufficient documentation

## 2017-05-15 DIAGNOSIS — M79602 Pain in left arm: Secondary | ICD-10-CM | POA: Diagnosis present

## 2017-05-15 DIAGNOSIS — M79601 Pain in right arm: Secondary | ICD-10-CM | POA: Diagnosis present

## 2017-05-15 DIAGNOSIS — M542 Cervicalgia: Secondary | ICD-10-CM | POA: Diagnosis not present

## 2017-05-15 NOTE — Therapy (Signed)
[place], [place], [address] Phone: [phone]   Fax:  [phone]  Physical Therapy Evaluation  Patient Details  Name: Joan Elliott MRN: [id number] Date of Birth: 31-Jul-1940 Referring Provider: Mcarthur Rossetti, MD   Encounter Date: 05/15/2017  PT End of Session - 05/15/17 1100    Visit Number  1    Number of Visits  13    Date for PT Re-Evaluation  07/14/17    Authorization Type  [employer] MCR    PT Start Time  1100    PT Stop Time  1149    PT Time Calculation (min)  49 min    Activity Tolerance  Patient limited by pain    Behavior During Therapy  [facility] for tasks assessed/performed       Past Medical History:  Diagnosis Date  . Arthritis   . Coronary artery disease    s/p stent mid RCA 2003  . GERD (gastroesophageal reflux disease)   . Gout   . HLD (hyperlipidemia)   . Hypertension   . Hypothyroidism   . Thyroid disease   . Tobacco abuse     Past Surgical History:  Procedure Laterality Date  . ABDOMINAL HYSTERECTOMY    . THYROID SURGERY      There were no vitals filed for this visit.   Subjective Assessment - 05/15/17 1105    Subjective  It hurts very bad. Hurts to abduct arms from shoulder to hands. Swelling in bilateral wrists/hands. Pt reports diagnosis of RA and has been hurting for years. Bilateral shoulder pain (L>R) that extends into neck. Uses a cane at home, rollator when she goes out. No notable cavitations in shoulders, does feel it in neck.     How long can you sit comfortably?  unlimited    How long can you stand comfortably?  15-20 min    Patient Stated Goals  be able to grab things and lift them up, difficulty sleeping    Currently in Pain?  Yes    Pain Score  9     Pain Location  -- neck/shoulders/hands    Pain Orientation  Right;Left    Pain Descriptors / Indicators  Tingling;Tightness    Pain Frequency  Constant    Aggravating Factors   constant    Pain  Relieving Factors  pain pill         [facility] PT Assessment - 05/15/17 0001      Assessment   Medical Diagnosis  back, neck, shoulder pain    Referring Provider  Mcarthur Rossetti, MD    Onset Date/Surgical Date  -- years ago    Hand Dominance  Right    Next MD Visit  11/19    Prior Therapy  last year but unsure      Precautions   Precautions  None      Restrictions   Weight Bearing Restrictions  No      Balance Screen   Has the patient fallen in the past 6 months  No      [place] residence    Living Arrangements  Alone    Additional Comments  no stairs at home      Prior Function   Level of [place]  Retired      Associate Professor   Overall Cognitive Status  Within Functional Limits for tasks assessed reports forgetfulness  Observation/Other Assessments   Focus on Therapeutic Outcomes (FOTO)   58% limited      Sensation   Additional Comments  N/T in bilateral hands      Posture/Postural Control   Posture Comments  forward rounded & elevated shoulders      ROM / Strength   AROM / PROM / Strength  AROM;Strength      AROM   AROM Assessment Site  Cervical;Shoulder    Right/Left Shoulder  Right;Left    Right Shoulder Flexion  128 Degrees pain    Left Shoulder Flexion  118 Degrees pain    Cervical Flexion  32    Cervical - Right Rotation  53    Cervical - Left Rotation  38      Strength   Overall Strength Comments  gross GHJ strength 3+/5 with pain in all tests      Palpation   Palpation comment  concordant pain in bilat upper traps             Objective measurements completed on examination: See above findings.      [facility] Adult PT Treatment/Exercise - 05/15/17 0001      Exercises   Exercises  Shoulder      Shoulder Exercises: Seated   Other Seated Exercises  scapular retraction             PT Education - 05/15/17 1139    Education provided  Yes    Education  Details  desensitization, anatomy of condition, POC, HEP, exercise form/rationale, FOTO    Person(s) Educated  Patient    Methods  Explanation;Demonstration;Tactile cues;Verbal cues    Comprehension  Verbalized understanding;Need further instruction;Returned demonstration;Verbal cues required;Tactile cues required          PT Long Term Goals - 05/15/17 1320      PT LONG TERM GOAL #1   Title  pt will be able to lift/place all dishes overhead indpendently    Baseline  limited by weight    Time  8    Period  Weeks    Status  New    Target Date  07/14/17      PT LONG TERM GOAL #2   Title  FOTO to 48% limited to indicate significant improvements in functional ability    Baseline  58% limited at eval    Time  8    Period  Weeks    Status  New    Target Date  07/14/17      PT LONG TERM GOAL #3   Title  Cervical rotation with 5 deg R to L for equal demand on biomechanical chain    Baseline  see flowsheet    Time  8    Period  Weeks    Status  New    Target Date  07/14/17      PT LONG TERM GOAL #4   Title  Pt will demo gross UE strength 4/5 to improve functional daily use    Baseline  see flowsheet    Time  8    Period  Weeks    Status  New    Target Date  07/14/17             Plan - 05/15/17 1227    Clinical Impression Statement  Pt presents to PT with complaints of bilateral shoulder/UE and neck pain that is chronic and severe. Reports diagnosis of RA but denies being followed by rheumatology since moving to [place]. Notable swelling at  joints of wrists and hands. All motions are painful but pt demo good avail AROM. Discussed pain science and importance of continued mobility. Pt will benefit from skilled PT in order to reduce cervical/shoulder tension to improve neck/shoulder mobility and functional use.     Clinical Presentation  Stable    Clinical Decision Making  Low    Rehab Potential  Good    PT Frequency  2x / week    PT Duration  8 weeks    PT  Treatment/Interventions  ADLs/Self Care Home Management;Cryotherapy;Electrical Stimulation;Ultrasound;Traction;Moist Heat;Functional mobility training;Therapeutic activities;Therapeutic exercise;Patient/family education;Neuromuscular re-education;Manual techniques;Passive range of motion;Taping;Dry needling    PT Next Visit Plan  pulleys, manual to upper traps    PT Home Exercise Plan  scapular retraction, desensitization    Consulted and Agree with Plan of Care  Patient       Patient will benefit from skilled therapeutic intervention in order to improve the following deficits and impairments:  Impaired sensation, Improper body mechanics, Pain, Postural dysfunction, Increased muscle spasms, Decreased activity tolerance, Decreased strength, Decreased range of motion, Impaired UE functional use, Obesity, Increased edema, Difficulty walking, Decreased balance  Visit Diagnosis: Cervicalgia - Plan: PT plan of care cert/re-cert  Chronic right shoulder pain - Plan: PT plan of care cert/re-cert  Chronic left shoulder pain - Plan: PT plan of care cert/re-cert  Pain in right arm - Plan: PT plan of care cert/re-cert  Pain in left arm - Plan: PT plan of care cert/re-cert  G-Codes - 49/82/64 1328    Functional Assessment Tool Used (Outpatient Only)  FOTO 58% limited    Functional Limitation  Carrying, moving and handling objects    Carrying, Moving and Handling Objects Current Status (B5830)  At least 60 percent but less than 80 percent impaired, limited or restricted    Carrying, Moving and Handling Objects Goal Status (N4076)  At least 40 percent but less than 60 percent impaired, limited or restricted        Problem List Patient Active Problem List   Diagnosis Date Noted  . Neck pain 03/07/2017  . Chronic right shoulder pain 03/07/2017  . Chronic left shoulder pain 03/07/2017  . Chest pain, rule out acute myocardial infarction   . Chest pain 02/23/2015  . Hypokalemia 02/23/2015  . AKI  (acute kidney injury) ([place]) 02/23/2015  . Diarrhea 02/23/2015  . Hypertension   . Coronary artery disease   . Thyroid disease   . Tobacco abuse   . Hypothyroidism   . HLD (hyperlipidemia)   . Gout   . GERD (gastroesophageal reflux disease)    Kitara Hebb C. Angel Hobdy PT, DPT 05/15/17 1:33 PM   [facility] [address] [place], [place], [address] Phone: [phone]   Fax:  [phone]  Name: Joan Elliott MRN: [id number] Date of Birth: [birth year]

## 2017-05-16 ENCOUNTER — Ambulatory Visit (INDEPENDENT_AMBULATORY_CARE_PROVIDER_SITE_OTHER)
Admission: RE | Admit: 2017-05-16 | Discharge: 2017-05-16 | Disposition: A | Discharge status: Home / Self Care | Payer: Medicare HMO | Source: Ambulatory Visit | Attending: Vascular Surgery | Admitting: Vascular Surgery

## 2017-05-16 ENCOUNTER — Ambulatory Visit (HOSPITAL_COMMUNITY)
Admission: RE | Admit: 2017-05-16 | Discharge: 2017-05-16 | Disposition: A | Discharge status: Home / Self Care | Payer: Medicare HMO | Source: Ambulatory Visit | Attending: Vascular Surgery | Admitting: Vascular Surgery

## 2017-05-16 DIAGNOSIS — I739 Peripheral vascular disease, unspecified: Secondary | ICD-10-CM

## 2017-05-16 LAB — VAS US LOWER EXTREMITY ARTERIAL DUPLEX
LEFT PERO DIST SYS: 45 cm/s
LPTIBDISTSYS: 19 cm/s
LSFPPSV: -296 cm/s
Left super femoral dist sys PSV: -51 cm/s
Left super femoral mid sys PSV: -125 cm/s
RATIBDISTSYS: 40 cm/s
RSFPPSV: -152 cm/s
RTIBDISTSYS: 71 cm/s
Right peroneal sys PSV: 47 cm/s
Right super femoral dist sys PSV: -113 cm/s
Right super femoral mid sys PSV: -96 cm/s

## 2017-05-17 ENCOUNTER — Encounter: Payer: Self-pay | Admitting: Physical Therapy

## 2017-05-17 ENCOUNTER — Ambulatory Visit: Payer: Medicare HMO | Admitting: Physical Therapy

## 2017-05-17 DIAGNOSIS — M542 Cervicalgia: Secondary | ICD-10-CM

## 2017-05-17 DIAGNOSIS — G8929 Other chronic pain: Secondary | ICD-10-CM

## 2017-05-17 DIAGNOSIS — M25511 Pain in right shoulder: Secondary | ICD-10-CM

## 2017-05-17 DIAGNOSIS — M25512 Pain in left shoulder: Secondary | ICD-10-CM

## 2017-05-17 DIAGNOSIS — M79602 Pain in left arm: Secondary | ICD-10-CM

## 2017-05-17 DIAGNOSIS — M79601 Pain in right arm: Secondary | ICD-10-CM

## 2017-05-17 NOTE — Therapy (Signed)
[place], [place], [address] Phone: [phone]   Fax:  [phone]  Physical Therapy Treatment  Patient Details  Name: Joan Elliott MRN: [id number] Date of Birth: 1941/06/11 Referring Provider: Mcarthur Rossetti, MD   Encounter Date: 05/17/2017  PT End of Session - 05/17/17 0909    Visit Number  2    Number of Visits  13    Date for PT Re-Evaluation  07/14/17    Authorization Type  [employer] MCR    PT Start Time  0903    PT Stop Time  0958    PT Time Calculation (min)  55 min       Past Medical History:  Diagnosis Date  . Arthritis   . Coronary artery disease    s/p stent mid RCA 2003  . GERD (gastroesophageal reflux disease)   . Gout   . HLD (hyperlipidemia)   . Hypertension   . Hypothyroidism   . Thyroid disease   . Tobacco abuse     Past Surgical History:  Procedure Laterality Date  . ABDOMINAL HYSTERECTOMY    . THYROID SURGERY      There were no vitals filed for this visit.  Subjective Assessment - 05/17/17 0907    Subjective  Still hurting. Doing twenty shouder squeezes at a time.     Currently in Pain?  Yes    Pain Score  6     Pain Location  -- neck shoulder hands     Pain Orientation  Right    Pain Descriptors / Indicators  -- stiffness    Aggravating Factors   moving head and arms     Pain Relieving Factors  meds                       [facility] Adult PT Treatment/Exercise - 05/17/17 0001      Shoulder Exercises: Seated   Other Seated Exercises  scapular retraction    Other Seated Exercises  shoulder rolls       Shoulder Exercises: Pulleys   Flexion  1 minute      Modalities   Modalities  Moist Heat      Moist Heat Therapy   Number Minutes Moist Heat  10 Minutes    Moist Heat Location  Cervical upper traps       Manual Therapy   Manual Therapy  Soft tissue mobilization    Soft tissue mobilization  IASTM bilateral upper traps and periscapular  area, Trigger point release x 4 to each upper trap, folllowed my manual soft tissue work to upper traps.                   PT Long Term Goals - 05/15/17 1320      PT LONG TERM GOAL #1   Title  pt will be able to lift/place all dishes overhead indpendently    Baseline  limited by weight    Time  8    Period  Weeks    Status  New    Target Date  07/14/17      PT LONG TERM GOAL #2   Title  FOTO to 48% limited to indicate significant improvements in functional ability    Baseline  58% limited at eval    Time  8    Period  Weeks    Status  New    Target Date  07/14/17      PT  LONG TERM GOAL #3   Title  Cervical rotation with 5 deg R to L for equal demand on biomechanical chain    Baseline  see flowsheet    Time  8    Period  Weeks    Status  New    Target Date  07/14/17      PT LONG TERM GOAL #4   Title  Pt will demo gross UE strength 4/5 to improve functional daily use    Baseline  see flowsheet    Time  8    Period  Weeks    Status  New    Target Date  07/14/17            Plan - 05/17/17 0948    Clinical Impression Statement  Pt reports compliance with scap squeezes. Began shoulder rolls and pulleys today with good tolerance. Began manual to upper traps. Tissue softened and pt demonstrates decreased sensitiity to touch after manual. Used HMP to decrease muscle soreness pt reported at end of treatment. Pt reported less pain with getting left UE into coat at end of treatment.     PT Next Visit Plan  pulleys, manual to upper traps    PT Home Exercise Plan  scapular retraction, desensitization    Consulted and Agree with Plan of Care  Patient       Patient will benefit from skilled therapeutic intervention in order to improve the following deficits and impairments:  Impaired sensation, Improper body mechanics, Pain, Postural dysfunction, Increased muscle spasms, Decreased activity tolerance, Decreased strength, Decreased range of motion, Impaired UE functional  use, Obesity, Increased edema, Difficulty walking, Decreased balance  Visit Diagnosis: Cervicalgia  Chronic right shoulder pain  Chronic left shoulder pain  Pain in right arm  Pain in left arm     Problem List Patient Active Problem List   Diagnosis Date Noted  . Neck pain 03/07/2017  . Chronic right shoulder pain 03/07/2017  . Chronic left shoulder pain 03/07/2017  . Chest pain, rule out acute myocardial infarction   . Chest pain 02/23/2015  . Hypokalemia 02/23/2015  . AKI (acute kidney injury) ([place]) 02/23/2015  . Diarrhea 02/23/2015  . Hypertension   . Coronary artery disease   . Thyroid disease   . Tobacco abuse   . Hypothyroidism   . HLD (hyperlipidemia)   . Gout   . GERD (gastroesophageal reflux disease)     Dorene Ar, PTA 05/17/2017, 10:02 AM  [facility] [address] [place], [place], [address] Phone: [phone]   Fax:  [phone]  Name: Joan Elliott MRN: [id number] Date of Birth: 04-10-1941

## 2017-05-18 ENCOUNTER — Ambulatory Visit (INDEPENDENT_AMBULATORY_CARE_PROVIDER_SITE_OTHER): Payer: Medicare HMO | Admitting: Vascular Surgery

## 2017-05-18 ENCOUNTER — Encounter: Payer: Self-pay | Admitting: Vascular Surgery

## 2017-05-18 VITALS — BP 139/84 | HR 95 | Temp 98.5°F | Resp 20 | Ht 60.0 in | Wt 208.3 lb

## 2017-05-18 DIAGNOSIS — I739 Peripheral vascular disease, unspecified: Secondary | ICD-10-CM

## 2017-05-18 NOTE — Progress Notes (Signed)
Referring Physician: Dr Jeanie Cooks  Patient name: Joan Elliott MRN: [id number] DOB: 1941-05-28 Sex: female  REASON FOR CONSULT: Pain in thighs and feet  HPI: Joan Elliott is a 76 y.o. female a several year history of pain in her feet which occurs around the clock. He does not really made worse by standing sitting or walking. She also complains of some swelling and burning and numbness in her right thigh. She states this is been present since she had a right knee replacement. She also complains of cramps that occur in her legs at nighttime. She does not really describe claudication. She denies history of diabetes. She does smoke quarter pack of cigarettes per day. Greater than 3 minutes they spent regarding smoking cessation counseling. Other medical problems include COPD and coronary artery disease. These have been stable. She is on Plavix.  Past Medical History:  Diagnosis Date  . Arthritis   . COPD (chronic obstructive pulmonary disease) ([place])   . Coronary artery disease    s/p stent mid RCA 2003  . GERD (gastroesophageal reflux disease)   . Gout   . HLD (hyperlipidemia)   . Hypertension   . Hypothyroidism   . Stroke ([place])   . Thyroid disease   . Tobacco abuse    Past Surgical History:  Procedure Laterality Date  . ABDOMINAL HYSTERECTOMY    . JOINT REPLACEMENT Right   . THYROID SURGERY      Family History  Problem Relation Age of Onset  . Hypertension Mother   . Kidney disease Mother   . Hypertension Brother   . Hypertension Sister     SOCIAL HISTORY: Social History   Socioeconomic History  . Marital status: Single    Spouse name: Not on file  . Number of children: Not on file  . Years of education: Not on file  . Highest education level: Not on file  Social Needs  . Financial resource strain: Not on file  . Food insecurity - worry: Not on file  . Food insecurity - inability: Not on file  . Transportation needs - medical: Not on file  . Transportation needs  - non-medical: Not on file  Occupational History  . Not on file  Tobacco Use  . Smoking status: Current Some Day Smoker    Packs/day: 0.50    Types: Cigarettes  . Smokeless tobacco: Never Used  Substance and Sexual Activity  . Alcohol use: No  . Drug use: No  . Sexual activity: Not on file  Other Topics Concern  . Not on file  Social History Narrative  . Not on file    Allergies  Allergen Reactions  . Ivp Dye [Iodinated Diagnostic Agents] Nausea And Vomiting  . Penicillins Rash    Has patient had a PCN reaction causing immediate rash, facial/tongue/throat swelling, SOB or lightheadedness with hypotension: Yes Has patient had a PCN reaction causing severe rash involving mucus membranes or skin necrosis: No Has patient had a PCN reaction that required hospitalization No Has patient had a PCN reaction occurring within the last 10 years: No If all of the above answers are "NO", then may proceed with Cephalosporin use.     Current Outpatient Medications  Medication Sig Dispense Refill  . acetaminophen-codeine (TYLENOL #4) 300-60 MG tablet Take 1 tablet by mouth 2 (two) times daily as needed (severe arthritis pain).   2  . albuterol (PROVENTIL HFA;VENTOLIN HFA) 108 (90 BASE) MCG/ACT inhaler Inhale 2 puffs into the lungs every 6 (  six) hours as needed for wheezing or shortness of breath.    Marland Kitchen albuterol (PROVENTIL) (2.5 MG/3ML) 0.083% nebulizer solution Take 2.5 mg by nebulization every 6 (six) hours as needed for wheezing or shortness of breath.    . allopurinol (ZYLOPRIM) 100 MG tablet Take 100 mg by mouth daily.    . Cholecalciferol (VITAMIN D PO) Take 1 tablet by mouth daily.    . clopidogrel (PLAVIX) 75 MG tablet Take 75 mg by mouth daily.    . diclofenac sodium (VOLTAREN) 1 % GEL Apply 4 (four) times daily topically.    . fexofenadine (ALLEGRA) 180 MG tablet Take 180 mg by mouth at bedtime.    Marland Kitchen levothyroxine (SYNTHROID, LEVOTHROID) 50 MCG tablet Take 50 mcg by mouth daily.    Marland Kitchen  losartan-hydrochlorothiazide (HYZAAR) 100-12.5 MG tablet Take 1 tablet by mouth daily.    Marland Kitchen lovastatin (MEVACOR) 40 MG tablet Take 40 mg by mouth at bedtime.    . methocarbamol (ROBAXIN) 500 MG tablet Take 500 mg 2 (two) times daily by mouth.    . pantoprazole (PROTONIX) 40 MG tablet Take 40 mg by mouth daily.    . SUMAtriptan (IMITREX) 100 MG tablet Take 1 tablet (100 mg total) by mouth every 2 (two) hours as needed for migraine. May repeat in 2 hours if headache persists or recurs. 10 tablet 0  . azithromycin (ZITHROMAX) 250 MG tablet Take 250-500 mg by mouth See admin instructions. Take 2 tablets (500 mg) by mouth 1st day, then take 1 tablet (250 mg) daily on days 2-5  0  . Chlorphen-PE-Acetaminophen (NOREL AD) 4-10-325 MG TABS Take 1 tablet by mouth 2 (two) times daily as needed (congestion).    . Fluticasone-Umeclidin-Vilant (TRELEGY ELLIPTA) 100-62.5-25 MCG/INH AEPB Inhale 1 day or 1 dose into the lungs.    . methylPREDNISolone (MEDROL) 4 MG tablet Medrol dose pack. Take as instructed (Patient not taking: Reported on 05/15/2017) 21 tablet 0  . tiZANidine (ZANAFLEX) 4 MG tablet TAKE 1 TABLET(4 MG) BY MOUTH TWICE DAILY AS NEEDED FOR MUSCLE SPASMS (Patient not taking: Reported on 05/15/2017) 180 tablet 0   No current facility-administered medications for this visit.     ROS:   General:  No weight loss, Fever, chills  HEENT: No recent headaches, no nasal bleeding, no visual changes, no sore throat  Neurologic: No dizziness, blackouts, seizures. No recent symptoms of stroke or mini- stroke. No recent episodes of slurred speech, or temporary blindness.  Cardiac: No recent episodes of chest pain/pressure, no shortness of breath at rest.  + shortness of breath with exertion.  Denies history of atrial fibrillation or irregular heartbeat  Vascular: No history of rest pain in feet.  No history of claudication.  No history of non-healing ulcer, No history of DVT   Pulmonary: No home oxygen, no  productive cough, no hemoptysis,  No asthma or wheezing  Musculoskeletal:  [X]  Arthritis, [X]  Low back pain,  [X]  Joint pain  Hematologic:No history of hypercoagulable state.  No history of easy bleeding.  No history of anemia  Gastrointestinal: No hematochezia or melena,  No gastroesophageal reflux, no trouble swallowing  Urinary: [ ]  chronic Kidney disease, [ ]  on HD - [ ]  MWF or [ ]  TTHS, [ ]  Burning with urination, [ ]  Frequent urination, [ ]  Difficulty urinating;   Skin: No rashes  Psychological: No history of anxiety,  No history of depression   Physical Examination  Vitals:   05/18/17 0917  BP: 139/84  Pulse: 95  Resp: 20  Temp: 98.5 F (36.9 C)  TempSrc: Oral  SpO2: 96%  Weight: 208 lb 4.8 oz (94.5 kg)  Height: 5' (1.524 m)    Body mass index is 40.68 kg/m.  General:  Alert and oriented, no acute distress HEENT: Normal Neck: No bruit or JVD Pulmonary: Clear to auscultation bilaterally Cardiac: Regular Rate and Rhythm without murmur Abdomen: Soft, non-tender, non-distended, no mass Skin: No rash, scattered small reticular type varicosities bilateral thighs Extremity Pulses:  2+ radial, brachial, femoral, dorsalis pedis, posterior tibial pulses bilaterally Musculoskeletal: No deformity or edema  Neurologic: Upper and lower extremity motor 5/5 and symmetric  DATA:  Patient had bilateral ABIs performed 05/16/2017. Left side was 0.97 right was 1.22 area toe pressure was 100 on the right 90 on the left. Waveforms were biphasic.  ASSESSMENT:  Patient with objective findings of mild peripheral arterial disease bilateral lower extremities.  However, she does not really describe claudication symptoms. I do not believe the numbness and burning that she hasn't her right thigh is related to peripheral arterial disease. Nighttime cramps are also not related to peripheral arterial disease.  I would not recommend any intervention at this point other than risk factor  modification.   PLAN:  Patient will try to quit smoking. She will follow-up with me on an as-needed basis.   Ruta Hinds, MD [facility] Office: [phone] Pager: [phone]

## 2017-05-22 ENCOUNTER — Ambulatory Visit (INDEPENDENT_AMBULATORY_CARE_PROVIDER_SITE_OTHER): Payer: Medicare HMO | Admitting: Orthopaedic Surgery

## 2017-05-22 ENCOUNTER — Encounter (INDEPENDENT_AMBULATORY_CARE_PROVIDER_SITE_OTHER): Payer: Self-pay | Admitting: Orthopaedic Surgery

## 2017-05-22 DIAGNOSIS — G8929 Other chronic pain: Secondary | ICD-10-CM | POA: Diagnosis not present

## 2017-05-22 DIAGNOSIS — M25512 Pain in left shoulder: Secondary | ICD-10-CM

## 2017-05-22 DIAGNOSIS — M542 Cervicalgia: Secondary | ICD-10-CM | POA: Diagnosis not present

## 2017-05-22 MED ORDER — METHYLPREDNISOLONE ACETATE 40 MG/ML IJ SUSP
40.0000 mg | INTRAMUSCULAR | Status: AC | PRN
  Administered 2017-05-22: 40 mg via INTRA_ARTICULAR

## 2017-05-22 MED ORDER — LIDOCAINE HCL 1 % IJ SOLN
3.0000 mL | INTRAMUSCULAR | Status: AC | PRN
  Administered 2017-05-22: 3 mL

## 2017-05-22 NOTE — Progress Notes (Signed)
Office Visit Note   Patient: Joan Elliott           Date of Birth: September 04, 1940           MRN: [id number] Visit Date: 05/22/2017              Requested by: Lucianne Lei, MD [address] [place], [place] [address] PCP: [name], MD   Assessment & Plan: Visit Diagnoses: No diagnosis found.  Plan: I spoke with her about trying a steroid injection left shoulder and she is agreeable to this.  I placed a steroid injection in the subacromial space of her left shoulder which she tolerated well.  All questions were addressed and concerns answered.  She will follow-up as needed at this point.  Follow-Up Instructions: Return if symptoms worsen or fail to improve.   Orders:  No orders of the defined types were placed in this encounter.  No orders of the defined types were placed in this encounter.     Procedures: Large Joint Inj: L subacromial bursa on 05/22/2017 11:36 AM Indications: pain and diagnostic evaluation Details: 22 G 1.5 in needle  Arthrogram: No  Medications: 3 mL lidocaine 1 %; 40 mg methylPREDNISolone acetate 40 MG/ML Outcome: tolerated well, no immediate complications Procedure, treatment alternatives, risks and benefits explained, specific risks discussed. Consent was given by the patient. Immediately prior to procedure a time out was called to verify the correct patient, procedure, equipment, support staff and site/side marked as required. Patient was prepped and draped in the usual sterile fashion.       Clinical Data: No additional findings.   Subjective: No chief complaint on file. The patient is coming in today with still complaints of neck pain low back pain and bilateral shoulder pain.  She follow with the physical therapy and that is helped some.  She said her neck pain is decreased as well as her right shoulder pain.  The left shoulder is bothering her enough today.  She cannot take anti-inflammatories due to being on Plavix.  We already x-rayed  her cervical spine and lumbar spine last few visits and see degenerative changes in her cervical and lumbar spine.  HPI  Review of Systems She currently denies any headache, chest pain, shortness of breath, fever, chills, nausea, vomiting.  Objective: Vital Signs: There were no vitals taken for this visit.  Physical Exam She is alert and oriented x3 and in no acute distress Ortho Exam Examination of her neck and shoulders show more of the pain to be in her left shoulder on range of motion that is in her neck today.  There is some paraspinal muscle pain as well. Specialty Comments:  No specialty comments available.  Imaging: No results found.   PMFS History: Patient Active Problem List   Diagnosis Date Noted  . Neck pain 03/07/2017  . Chronic right shoulder pain 03/07/2017  . Chronic left shoulder pain 03/07/2017  . Chest pain, rule out acute myocardial infarction   . Chest pain 02/23/2015  . Hypokalemia 02/23/2015  . AKI (acute kidney injury) ([place]) 02/23/2015  . Diarrhea 02/23/2015  . Hypertension   . Coronary artery disease   . Thyroid disease   . Tobacco abuse   . Hypothyroidism   . HLD (hyperlipidemia)   . Gout   . GERD (gastroesophageal reflux disease)    Past Medical History:  Diagnosis Date  . Arthritis   . COPD (chronic obstructive pulmonary disease) ([place])   . Coronary  artery disease    s/p stent mid RCA 2003  . GERD (gastroesophageal reflux disease)   . Gout   . HLD (hyperlipidemia)   . Hypertension   . Hypothyroidism   . Stroke ([place])   . Thyroid disease   . Tobacco abuse     Family History  Problem Relation Age of Onset  . Hypertension Mother   . Kidney disease Mother   . Hypertension Brother   . Hypertension Sister     Past Surgical History:  Procedure Laterality Date  . ABDOMINAL HYSTERECTOMY    . JOINT REPLACEMENT Right   . THYROID SURGERY     Social History   Occupational History  . Not on file  Tobacco Use  . Smoking status:  Current Some Day Smoker    Packs/day: 0.50    Types: Cigarettes  . Smokeless tobacco: Never Used  Substance and Sexual Activity  . Alcohol use: No  . Drug use: No  . Sexual activity: Not on file

## 2017-05-23 ENCOUNTER — Ambulatory Visit: Payer: Medicare HMO | Admitting: Physical Therapy

## 2017-05-31 ENCOUNTER — Encounter: Payer: Self-pay | Admitting: Physical Therapy

## 2017-05-31 ENCOUNTER — Ambulatory Visit: Payer: Medicare HMO | Admitting: Physical Therapy

## 2017-05-31 DIAGNOSIS — G8929 Other chronic pain: Secondary | ICD-10-CM

## 2017-05-31 DIAGNOSIS — M25512 Pain in left shoulder: Secondary | ICD-10-CM

## 2017-05-31 DIAGNOSIS — M25511 Pain in right shoulder: Secondary | ICD-10-CM

## 2017-05-31 DIAGNOSIS — M79602 Pain in left arm: Secondary | ICD-10-CM

## 2017-05-31 DIAGNOSIS — M542 Cervicalgia: Secondary | ICD-10-CM

## 2017-05-31 DIAGNOSIS — M79601 Pain in right arm: Secondary | ICD-10-CM

## 2017-05-31 NOTE — Therapy (Signed)
[place], [place], [address] Phone: [phone]   Fax:  [phone]  Physical Therapy Treatment  Patient Details  Name: Joan Elliott MRN: [id number] Date of Birth: [birth year] Referring Provider: Mcarthur Rossetti, MD   Encounter Date: 05/31/2017  PT End of Session - 05/31/17 1014    Visit Number  3    Number of Visits  13    Date for PT Re-Evaluation  07/14/17    Authorization Type  [employer] MCR    PT Start Time  1014    PT Stop Time  1105    PT Time Calculation (min)  51 min    Activity Tolerance  Patient limited by pain    Behavior During Therapy  [facility] for tasks assessed/performed       Past Medical History:  Diagnosis Date  . Arthritis   . COPD (chronic obstructive pulmonary disease) ([place])   . Coronary artery disease    s/p stent mid RCA 2003  . GERD (gastroesophageal reflux disease)   . Gout   . HLD (hyperlipidemia)   . Hypertension   . Hypothyroidism   . Stroke ([place])   . Thyroid disease   . Tobacco abuse     Past Surgical History:  Procedure Laterality Date  . ABDOMINAL HYSTERECTOMY    . JOINT REPLACEMENT Right   . THYROID SURGERY      There were no vitals filed for this visit.  Subjective Assessment - 05/31/17 1018    Subjective  this weather is not helping me. I got the shot but it's not helping. I think the neck is better than it was, does not crack as much.     Patient Stated Goals  be able to grab things and lift them up, difficulty sleeping    Currently in Pain?  Yes    Pain Score  6     Pain Location  Shoulder    Pain Orientation  Left    Pain Descriptors / Indicators  Aching                      [facility] Adult PT Treatment/Exercise - 05/31/17 0001      Shoulder Exercises: Supine   External Rotation  20 reps;Theraband yellow    Flexion  10 reps with wand, reports "catch" in bilat shoulders ant aspect    Other Supine Exercises  cervical retraction       Shoulder Exercises: Pulleys   Flexion  3 minutes      Shoulder Exercises: Stretch   Other Shoulder Stretches  upper trap & levator stretch    Other Shoulder Stretches  chest stretch      Moist Heat Therapy   Number Minutes Moist Heat  10 Minutes    Moist Heat Location  Cervical      Manual Therapy   Manual Therapy  Manual Traction    Soft tissue mobilization  IASTM & STM to Lt upper trap    Manual Traction  cervical             PT Education - 05/31/17 1105    Education provided  Yes    Education Details  rationale for manual therapy, TPDN, exercise form/rationale    Person(s) Educated  Patient    Methods  Explanation;Demonstration;Tactile cues;Verbal cues    Comprehension  Verbalized understanding;Need further instruction;Returned demonstration;Verbal cues required;Tactile cues required          PT Long Term Goals -  05/15/17 1320      PT LONG TERM GOAL #1   Title  pt will be able to lift/place all dishes overhead indpendently    Baseline  limited by weight    Time  8    Period  Weeks    Status  New    Target Date  07/14/17      PT LONG TERM GOAL #2   Title  FOTO to 48% limited to indicate significant improvements in functional ability    Baseline  58% limited at eval    Time  8    Period  Weeks    Status  New    Target Date  07/14/17      PT LONG TERM GOAL #3   Title  Cervical rotation with 5 deg R to L for equal demand on biomechanical chain    Baseline  see flowsheet    Time  8    Period  Weeks    Status  New    Target Date  07/14/17      PT LONG TERM GOAL #4   Title  Pt will demo gross UE strength 4/5 to improve functional daily use    Baseline  see flowsheet    Time  8    Period  Weeks    Status  New    Target Date  07/14/17            Plan - 05/31/17 1102    Clinical Impression Statement  Pt c/o most pain in Lt upper trap today, concordant pain found in trigger point and reduced with manual therapy. Pt c/o significant pain in  bilateral shoulders with all exercises and wincing in chin tucks. Will continue as tolerated.     PT Treatment/Interventions  ADLs/Self Care Home Management;Cryotherapy;Electrical Stimulation;Ultrasound;Traction;Moist Heat;Functional mobility training;Therapeutic activities;Therapeutic exercise;Patient/family education;Neuromuscular re-education;Manual techniques;Passive range of motion;Taping;Dry needling    PT Next Visit Plan  pulleys, periscapular strengthening, DN upper trap, add cervical stretches to HEP    PT Home Exercise Plan  scapular retraction, desensitization       Patient will benefit from skilled therapeutic intervention in order to improve the following deficits and impairments:  Impaired sensation, Improper body mechanics, Pain, Postural dysfunction, Increased muscle spasms, Decreased activity tolerance, Decreased strength, Decreased range of motion, Impaired UE functional use, Obesity, Increased edema, Difficulty walking, Decreased balance  Visit Diagnosis: Cervicalgia  Chronic right shoulder pain  Chronic left shoulder pain  Pain in right arm  Pain in left arm     Problem List Patient Active Problem List   Diagnosis Date Noted  . Cervicalgia 03/07/2017  . Chronic right shoulder pain 03/07/2017  . Chronic left shoulder pain 03/07/2017  . Chest pain, rule out acute myocardial infarction   . Chest pain 02/23/2015  . Hypokalemia 02/23/2015  . AKI (acute kidney injury) ([place]) 02/23/2015  . Diarrhea 02/23/2015  . Hypertension   . Coronary artery disease   . Thyroid disease   . Tobacco abuse   . Hypothyroidism   . HLD (hyperlipidemia)   . Gout   . GERD (gastroesophageal reflux disease)     Stefano Trulson C. Alyxandria Wentz PT, DPT 05/31/17 11:06 AM   [facility] [address] [place], [place], [address] Phone: [phone]   Fax:  [phone]  Name: Joan Elliott MRN: [id number] Date of Birth: 1940/12/03

## 2017-06-01 ENCOUNTER — Encounter (HOSPITAL_COMMUNITY): Payer: Medicare HMO

## 2017-06-01 ENCOUNTER — Encounter: Payer: Medicare HMO | Admitting: Vascular Surgery

## 2017-06-02 ENCOUNTER — Ambulatory Visit: Payer: Medicare HMO | Admitting: Physical Therapy

## 2017-06-02 ENCOUNTER — Encounter: Payer: Self-pay | Admitting: Physical Therapy

## 2017-06-02 DIAGNOSIS — M25511 Pain in right shoulder: Secondary | ICD-10-CM

## 2017-06-02 DIAGNOSIS — M542 Cervicalgia: Secondary | ICD-10-CM | POA: Diagnosis not present

## 2017-06-02 DIAGNOSIS — G8929 Other chronic pain: Secondary | ICD-10-CM

## 2017-06-02 DIAGNOSIS — M25512 Pain in left shoulder: Secondary | ICD-10-CM

## 2017-06-02 DIAGNOSIS — M79602 Pain in left arm: Secondary | ICD-10-CM

## 2017-06-02 DIAGNOSIS — M79601 Pain in right arm: Secondary | ICD-10-CM

## 2017-06-02 NOTE — Therapy (Addendum)
[place], [place], [address] Phone: [phone]   Fax:  [phone]  Physical Therapy Treatment/Discharge Summary  Patient Details  Name: Joan Elliott MRN: [id number] Date of Birth: 1940-08-25 Referring Provider: Mcarthur Rossetti, MD   Encounter Date: 06/02/2017  PT End of Session - 06/02/17 1049    Visit Number  4    Number of Visits  13    Date for PT Re-Evaluation  07/14/17    Authorization Type  [employer] MCR    PT Start Time  1540    PT Stop Time  1052    PT Time Calculation (min)  60 min       Past Medical History:  Diagnosis Date  . Arthritis   . COPD (chronic obstructive pulmonary disease) ([place])   . Coronary artery disease    s/p stent mid RCA 2003  . GERD (gastroesophageal reflux disease)   . Gout   . HLD (hyperlipidemia)   . Hypertension   . Hypothyroidism   . Stroke ([place])   . Thyroid disease   . Tobacco abuse     Past Surgical History:  Procedure Laterality Date  . ABDOMINAL HYSTERECTOMY    . JOINT REPLACEMENT Right   . THYROID SURGERY      There were no vitals filed for this visit.  Subjective Assessment - 06/02/17 1049    Subjective  Having pain on right side of neck.      Currently in Pain?  Yes    Pain Score  6     Pain Location  Shoulder    Pain Orientation  Right;Left    Aggravating Factors   moving head and arms     Pain Relieving Factors  meds, heat                       [facility] Adult PT Treatment/Exercise - 06/02/17 0001      Shoulder Exercises: Seated   Other Seated Exercises  scapular retraction x 10, scap retraction with ER yellow theraband       Modalities   Modalities  Ultrasound      Moist Heat Therapy   Number Minutes Moist Heat  15 Minutes    Moist Heat Location  Cervical      Ultrasound   Ultrasound Location  right upper trap, cervical     Ultrasound Parameters  3.3 mhz, 100% 1.6 w/cm2       Manual Therapy   Soft  tissue mobilization  STM, TPR to bilateral upper traps       Neck Exercises: Stretches   Upper Trapezius Stretch  3 reps;10 seconds    Levator Stretch  3 reps;10 seconds             PT Education - 06/02/17 1051    Education provided  Yes    Education Details  HEP    Person(s) Educated  Patient    Methods  Explanation;Handout    Comprehension  Verbalized understanding          PT Long Term Goals - 05/15/17 1320      PT LONG TERM GOAL #1   Title  pt will be able to lift/place all dishes overhead indpendently    Baseline  limited by weight    Time  8    Period  Weeks    Status  New    Target Date  07/14/17      PT LONG TERM  GOAL #2   Title  FOTO to 48% limited to indicate significant improvements in functional ability    Baseline  58% limited at eval    Time  8    Period  Weeks    Status  New    Target Date  07/14/17      PT LONG TERM GOAL #3   Title  Cervical rotation with 5 deg R to L for equal demand on biomechanical chain    Baseline  see flowsheet    Time  8    Period  Weeks    Status  New    Target Date  07/14/17      PT LONG TERM GOAL #4   Title  Pt will demo gross UE strength 4/5 to improve functional daily use    Baseline  see flowsheet    Time  8    Period  Weeks    Status  New    Target Date  07/14/17            Plan - 06/02/17 1059    Clinical Impression Statement  Pt arrives reporting scap squeeze exercise is helping her. She has new increased pain on right side of neck which increased with neck motions. Instructed pt in upper trap and levator stretches for HEP. Also began seated yellow bands and added to HEP. She will need a picture print out as this was forgotten this visit. She will be out of town for 2 weeks and is scheduled for Dec 11th. She is open to Dry needling for upper trap pain. Performed heated [place] to tight neck and manual trigger point release to bilateral upper traps. Pt reprots decreased pain with cervical motions at end of  treatment.     PT Next Visit Plan  Review HEP, give pictures; TPDN ,pulleys, periscapular strengthening, DN upper trap, add cervical stretches to HEP    PT Home Exercise Plan  scapular retraction, desensitization, upper trap and levotor stretch, yellow band horizontal abduction and ER     Consulted and Agree with Plan of Care  Patient       Patient will benefit from skilled therapeutic intervention in order to improve the following deficits and impairments:  Impaired sensation, Improper body mechanics, Pain, Postural dysfunction, Increased muscle spasms, Decreased activity tolerance, Decreased strength, Decreased range of motion, Impaired UE functional use, Obesity, Increased edema, Difficulty walking, Decreased balance  Visit Diagnosis: Cervicalgia  Chronic right shoulder pain  Chronic left shoulder pain  Pain in right arm  Pain in left arm     Problem List Patient Active Problem List   Diagnosis Date Noted  . Cervicalgia 03/07/2017  . Chronic right shoulder pain 03/07/2017  . Chronic left shoulder pain 03/07/2017  . Chest pain, rule out acute myocardial infarction   . Chest pain 02/23/2015  . Hypokalemia 02/23/2015  . AKI (acute kidney injury) ([place]) 02/23/2015  . Diarrhea 02/23/2015  . Hypertension   . Coronary artery disease   . Thyroid disease   . Tobacco abuse   . Hypothyroidism   . HLD (hyperlipidemia)   . Gout   . GERD (gastroesophageal reflux disease)     Dorene Ar, PTA 06/02/2017, 11:10 AM  [facility] [address] [place], [place], [address] Phone: [phone]   Fax:  [phone]  Name: Joan Elliott MRN: [id number] Date of Birth: 1941/05/11  PHYSICAL THERAPY DISCHARGE SUMMARY  Visits from Start of Care: 4  Current functional level related  to goals / functional outcomes: See above   Remaining deficits: See above   Education / Equipment: Anatomy of condition, POC, HEP,exercise  form/rationale  Plan: Patient agrees to discharge.  Patient goals were not met. Patient is being discharged due to not returning since the last visit.  ?????   D/C per attendance policy for multiple No shows.    Baylen Buckner C. Hightower PT, DPT 07/03/17 9:25 AM

## 2017-06-02 NOTE — Patient Instructions (Signed)
Verbal instructions to tilt head side to side and hold 10 sec for upper trap stretch x3, also turn and look down for levator stretch 10 sec x 3 , 2 x per day   Yellow band horizontal abduction: raise arms in front with yellow band and pull apart, return slowly. Repeat 5-10 times if no pain. 2 x per day  Shoulder ER with scap squeeze: Sit with elbow flexed and at sides of body. Pull hands apart with yellow band.Also, squeeze shoulder blades together. retrun slowly. Repeat 5-10 times, if no pain.  2 x per day.

## 2017-06-13 ENCOUNTER — Encounter: Payer: Self-pay | Admitting: Physical Therapy

## 2017-06-16 ENCOUNTER — Ambulatory Visit: Payer: Medicare HMO | Attending: Orthopaedic Surgery | Admitting: Physical Therapy

## 2017-06-20 ENCOUNTER — Ambulatory Visit: Payer: Medicare HMO | Admitting: Physical Therapy

## 2017-06-23 ENCOUNTER — Encounter: Payer: Self-pay | Admitting: Physical Therapy

## 2017-08-07 ENCOUNTER — Encounter: Payer: Self-pay | Admitting: Cardiology

## 2017-09-14 ENCOUNTER — Encounter (INDEPENDENT_AMBULATORY_CARE_PROVIDER_SITE_OTHER): Payer: Self-pay | Admitting: Physician Assistant

## 2017-09-14 ENCOUNTER — Ambulatory Visit (INDEPENDENT_AMBULATORY_CARE_PROVIDER_SITE_OTHER): Payer: Medicare HMO | Admitting: Physician Assistant

## 2017-09-14 DIAGNOSIS — M4807 Spinal stenosis, lumbosacral region: Secondary | ICD-10-CM

## 2017-09-14 MED ORDER — DIAZEPAM 5 MG PO TABS: ORAL_TABLET | ORAL | 0 refills | Status: DC

## 2017-09-14 NOTE — Progress Notes (Signed)
Office Visit Note   Patient: Joan Elliott           Date of Birth: Oct 15, 1940           MRN: [id number] Visit Date: 09/14/2017              Requested by: [name], MD [address] [place], [place] [address] PCP: [name], MD   Assessment & Plan: Visit Diagnoses:  1. Spinal stenosis of lumbosacral region     Plan: We will obtain an MRI of her lumbar spine to rule out HNP as a source of her radicular symptoms down her right leg.  She is tried medications, time and physical therapy for low back pain and pain is not resolving.  Have her follow-up after the MRI to go over results and discuss further treatment.  Due to her claustrophobia she was given 2 5 mg Valium to take 1 prior to the procedure one at the time of procedure if needed.  Questions are encouraged and answered at length today.  Follow-Up Instructions: Return for AFTER MRI.   Orders:  Orders Placed This Encounter  Procedures  . MR Lumbar Spine w/o contrast   Meds ordered this encounter  Medications  . diazepam (VALIUM) 5 MG tablet    Sig: TAKE ONE TAB ONE HOUR PRIOR TO MRI REPEAT AS NEEDED #2 . ZERO REFILLS    Dispense:  2 tablet    Refill:  0      Procedures: No procedures performed   Clinical Data: No additional findings.   Subjective: Chief Complaint  Patient presents with  . Right Leg - Pain  . Left Knee - Pain    HPI Mrs. Frakes returns today due to right leg and foot pain.  She states she has a burning sensation and some numbness down the lateral aspect of her right thigh that goes anterior on the leg and down into her foot.  She states she has numbness in both feet.  She states her feet stay cold.  She has no waking pain.  She does have some low back pain.  Said no bowel bladder dysfunction.  No fevers chills. Review of Systems Review of systems positive for shortness of breath negative for chest pain negative for bowel bladder dysfunction.  Negative for fevers positive chills.   No change in weight.  Please see HPI otherwise negative  Objective: Vital Signs: There were no vitals taken for this visit.  Physical Exam  Constitutional: She is oriented to person, place, and time. She appears well-developed. No distress.  Cardiovascular: Intact distal pulses.  Pulmonary/Chest: Breath sounds normal.  Neurological: She is alert and oriented to person, place, and time.  Skin: She is not diaphoretic.  Psychiatric: She has a normal mood and affect.    Ortho Exam Lower extremities she has 5 out of 5 strength throughout against resistance.  She is able to walk on her tiptoes and heels.  Tenderness right lower lumbar spine in the paraspinous region.  Positive straight leg raise bilaterally.  Pes planus deformities bilaterally. Specialty Comments:  No specialty comments available.  Imaging: No results found.   PMFS History: Patient Active Problem List   Diagnosis Date Noted  . Cervicalgia 03/07/2017  . Chronic right shoulder pain 03/07/2017  . Chronic left shoulder pain 03/07/2017  . Chest pain, rule out acute myocardial infarction   . Chest pain 02/23/2015  . Hypokalemia 02/23/2015  . AKI (acute kidney injury) ([place]) 02/23/2015  . Diarrhea 02/23/2015  .  Hypertension   . Coronary artery disease   . Thyroid disease   . Tobacco abuse   . Hypothyroidism   . HLD (hyperlipidemia)   . Gout   . GERD (gastroesophageal reflux disease)    Past Medical History:  Diagnosis Date  . Arthritis   . COPD (chronic obstructive pulmonary disease) ([place])   . Coronary artery disease    s/p stent mid RCA 2003  . GERD (gastroesophageal reflux disease)   . Gout   . HLD (hyperlipidemia)   . Hypertension   . Hypothyroidism   . Stroke ([place])   . Thyroid disease   . Tobacco abuse     Family History  Problem Relation Age of Onset  . Hypertension Mother   . Kidney disease Mother   . Hypertension Brother   . Hypertension Sister     Past Surgical History:  Procedure  Laterality Date  . ABDOMINAL HYSTERECTOMY    . JOINT REPLACEMENT Right   . THYROID SURGERY     Social History   Occupational History  . Not on file  Tobacco Use  . Smoking status: Current Some Day Smoker    Packs/day: 0.50    Types: Cigarettes  . Smokeless tobacco: Never Used  Substance and Sexual Activity  . Alcohol use: No  . Drug use: No  . Sexual activity: Not on file

## 2017-09-29 ENCOUNTER — Ambulatory Visit
Admission: RE | Admit: 2017-09-29 | Discharge: 2017-09-29 | Disposition: A | Discharge status: Home / Self Care | Payer: Medicare HMO | Source: Ambulatory Visit | Attending: Physician Assistant | Admitting: Physician Assistant

## 2017-09-29 DIAGNOSIS — M4807 Spinal stenosis, lumbosacral region: Secondary | ICD-10-CM

## 2017-10-02 ENCOUNTER — Encounter (INDEPENDENT_AMBULATORY_CARE_PROVIDER_SITE_OTHER): Payer: Self-pay | Admitting: Specialist

## 2017-10-02 ENCOUNTER — Encounter (INDEPENDENT_AMBULATORY_CARE_PROVIDER_SITE_OTHER): Payer: Self-pay

## 2017-10-02 ENCOUNTER — Ambulatory Visit (INDEPENDENT_AMBULATORY_CARE_PROVIDER_SITE_OTHER): Payer: Medicare HMO | Admitting: Specialist

## 2017-10-02 ENCOUNTER — Encounter (INDEPENDENT_AMBULATORY_CARE_PROVIDER_SITE_OTHER): Payer: Self-pay | Admitting: Physician Assistant

## 2017-10-02 ENCOUNTER — Encounter (INDEPENDENT_AMBULATORY_CARE_PROVIDER_SITE_OTHER): Payer: Medicare HMO | Admitting: Physician Assistant

## 2017-10-02 VITALS — BP 130/75 | HR 70 | Temp 97.4°F | Ht 60.0 in | Wt 210.0 lb

## 2017-10-02 DIAGNOSIS — M48062 Spinal stenosis, lumbar region with neurogenic claudication: Secondary | ICD-10-CM | POA: Diagnosis not present

## 2017-10-02 DIAGNOSIS — M4316 Spondylolisthesis, lumbar region: Secondary | ICD-10-CM

## 2017-10-02 MED ORDER — GABAPENTIN 100 MG PO CAPS: 100.0000 mg | ORAL_CAPSULE | Freq: Every day | ORAL | 1 refills | Status: DC

## 2017-10-02 NOTE — Progress Notes (Signed)
Office Visit Note   Patient: Joan Elliott           Date of Birth: 07-Jan-1941           MRN: [id number] Visit Date: 10/02/2017              Requested by: [name], MD [address] [place], [place] [address] PCP: [name], MD   Assessment & Plan: Visit Diagnoses:  1. Spinal stenosis of lumbar region with neurogenic claudication   2. Spondylolisthesis, lumbar region     Plan: Avoid bending, stooping and avoid lifting weights greater than 10 lbs. Avoid prolong standing and walking. Avoid frequent bending and stooping  No lifting greater than 10 lbs. May use ice or moist heat for pain. Weight loss is of benefit. Handicap license is approved. Dr. Romona Curls secretary/Assistant will call to arrange for epidural steroid injection   Follow-Up Instructions: Return in about 1 month (around 10/30/2017).   Orders:  Orders Placed This Encounter  Procedures  . Ambulatory referral to Physical Medicine Rehab   No orders of the defined types were placed in this encounter.     Procedures: No procedures performed   Clinical Data: No additional findings.   Subjective: No chief complaint on file.   77 year old female with right sided leg pain greater than left, worse with standing and walking. Burning pain into the right anterior shin and right foot dorsally, plain radiographs with Grade 1 slip L4-5 and disc narrowing L3-4. She reports right knee pain, status post right TKR. She wants to avoid surgery if at all possible. She has history of borderline diabetes mellitus. Pain worsening with standing and walking. Can only walk a half a block. Tried to grocery shop but had to find a place to sit to relieve the pain in the back and leg. Feels severe pain when transitioning to standing.    Review of Systems  Constitutional: Negative.   HENT: Positive for congestion, sinus pressure, sinus pain and sneezing.   Eyes: Negative.  Negative for photophobia, pain, discharge,  redness, itching and visual disturbance.  Respiratory: Positive for cough and wheezing. Negative for apnea, choking, chest tightness, shortness of breath and stridor.   Cardiovascular: Negative.  Negative for chest pain, palpitations and leg swelling.  Gastrointestinal: Negative.  Negative for abdominal distention, abdominal pain, anal bleeding, blood in stool, constipation, diarrhea, nausea, rectal pain and vomiting.  Endocrine: Negative for cold intolerance, heat intolerance, polydipsia, polyphagia and polyuria.  Genitourinary: Negative.  Negative for difficulty urinating, dyspareunia, dysuria, enuresis, flank pain, frequency, genital sores and hematuria.  Musculoskeletal: Positive for arthralgias, back pain, gait problem and joint swelling. Negative for myalgias, neck pain and neck stiffness.  Skin: Negative.  Negative for color change, pallor, rash and wound.  Allergic/Immunologic: Negative.  Negative for environmental allergies, food allergies and immunocompromised state.  Neurological: Positive for weakness and numbness. Negative for dizziness, tremors, seizures, syncope, facial asymmetry, speech difficulty, light-headedness and headaches.  Hematological: Negative.  Negative for adenopathy. Does not bruise/bleed easily.  Psychiatric/Behavioral: Negative.  Negative for agitation, behavioral problems, confusion, decreased concentration, dysphoric mood, hallucinations, self-injury, sleep disturbance and suicidal ideas. The patient is not nervous/anxious and is not hyperactive.      Objective: Vital Signs: There were no vitals taken for this visit.  Physical Exam  Constitutional: She is oriented to person, place, and time. She appears well-developed and well-nourished.  HENT:  Head: Normocephalic and atraumatic.  Eyes: Pupils are equal, round, and reactive to light.  EOM are normal.  Neck: Normal range of motion. Neck supple.  Pulmonary/Chest: Effort normal and breath sounds normal.    Abdominal: Soft. Bowel sounds are normal.  Neurological: She is alert and oriented to person, place, and time.  Skin: Skin is warm and dry.  Psychiatric: She has a normal mood and affect. Her behavior is normal. Judgment and thought content normal.    Back Exam   Tenderness  The patient is experiencing tenderness in the lumbar.  Range of Motion  Extension: abnormal  Flexion: normal  Lateral bend right: normal  Lateral bend left: normal  Rotation right: normal  Rotation left: normal   Muscle Strength  Right Quadriceps:  5/5  Left Quadriceps:  5/5  Right Hamstrings:  5/5  Left Hamstrings:  5/5   Tests  Straight leg raise right: negative Straight leg raise left: negative  Reflexes  Patellar: normal Achilles: normal Biceps: normal Babinski's sign: normal   Other  Toe walk: normal Heel walk: normal Sensation: normal Gait: abnormal  Erythema: no back redness Scars: absent  Comments:  Right foot DF 5/5, EHL right is 5-/5.      Specialty Comments:  No specialty comments available.  Imaging: No results found.   PMFS History: Patient Active Problem List   Diagnosis Date Noted  . Cervicalgia 03/07/2017  . Chronic right shoulder pain 03/07/2017  . Chronic left shoulder pain 03/07/2017  . Chest pain, rule out acute myocardial infarction   . Chest pain 02/23/2015  . Hypokalemia 02/23/2015  . AKI (acute kidney injury) ([place]) 02/23/2015  . Diarrhea 02/23/2015  . Hypertension   . Coronary artery disease   . Thyroid disease   . Tobacco abuse   . Hypothyroidism   . HLD (hyperlipidemia)   . Gout   . GERD (gastroesophageal reflux disease)    Past Medical History:  Diagnosis Date  . Arthritis   . COPD (chronic obstructive pulmonary disease) ([place])   . Coronary artery disease    s/p stent mid RCA 2003  . GERD (gastroesophageal reflux disease)   . Gout   . HLD (hyperlipidemia)   . Hypertension   . Hypothyroidism   . Stroke ([place])   . Thyroid disease   .  Tobacco abuse     Family History  Problem Relation Age of Onset  . Hypertension Mother   . Kidney disease Mother   . Hypertension Brother   . Hypertension Sister     Past Surgical History:  Procedure Laterality Date  . ABDOMINAL HYSTERECTOMY    . JOINT REPLACEMENT Right   . THYROID SURGERY     Social History   Occupational History  . Not on file  Tobacco Use  . Smoking status: Current Some Day Smoker    Packs/day: 0.50    Types: Cigarettes  . Smokeless tobacco: Never Used  Substance and Sexual Activity  . Alcohol use: No  . Drug use: No  . Sexual activity: Not on file

## 2017-10-02 NOTE — Patient Instructions (Addendum)
Avoid bending, stooping and avoid lifting weights greater than 10 lbs. Avoid prolong standing and walking. Avoid frequent bending and stooping  No lifting greater than 10 lbs. May use ice or moist heat for pain. Weight loss is of benefit. Handicap license is approved. Dr. Romona Curls secretary/Assistant will call to arrange for epidural steroid injection  Has adequate tylenol #4  For pain.

## 2017-10-04 ENCOUNTER — Telehealth (INDEPENDENT_AMBULATORY_CARE_PROVIDER_SITE_OTHER): Payer: Self-pay | Admitting: *Deleted

## 2017-10-06 ENCOUNTER — Telehealth (INDEPENDENT_AMBULATORY_CARE_PROVIDER_SITE_OTHER): Payer: Self-pay | Admitting: *Deleted

## 2017-10-10 ENCOUNTER — Encounter: Payer: Self-pay | Admitting: Internal Medicine

## 2017-10-24 ENCOUNTER — Ambulatory Visit (INDEPENDENT_AMBULATORY_CARE_PROVIDER_SITE_OTHER): Payer: Self-pay

## 2017-10-24 ENCOUNTER — Encounter (INDEPENDENT_AMBULATORY_CARE_PROVIDER_SITE_OTHER): Payer: Self-pay | Admitting: Physical Medicine and Rehabilitation

## 2017-10-24 ENCOUNTER — Ambulatory Visit (INDEPENDENT_AMBULATORY_CARE_PROVIDER_SITE_OTHER): Payer: Medicare HMO | Admitting: Physical Medicine and Rehabilitation

## 2017-10-24 VITALS — BP 129/80 | HR 97 | Temp 98.4°F

## 2017-10-24 DIAGNOSIS — M5416 Radiculopathy, lumbar region: Secondary | ICD-10-CM | POA: Diagnosis not present

## 2017-10-24 MED ORDER — BETAMETHASONE SOD PHOS & ACET 6 (3-3) MG/ML IJ SUSP
12.0000 mg | Freq: Once | INTRAMUSCULAR | Status: AC
  Administered 2017-10-24: 12 mg

## 2017-10-24 NOTE — Patient Instructions (Signed)

## 2017-10-24 NOTE — Progress Notes (Signed)
  Numeric Pain Rating Scale and Functional Assessment Average Pain 9   In the last MONTH (on 0-10 scale) has pain interfered with the following?  1. General activity like being  able to carry out your everyday physical activities such as walking, climbing stairs, carrying groceries, or moving a chair?  Rating(8)   +Driver, -BT(stopped taking plavix 10/16/17), +Dye Allergies(contrast media).

## 2017-10-24 NOTE — Progress Notes (Signed)
appt cancelled This encounter was created in error - please disregard.

## 2017-10-25 NOTE — Procedures (Signed)
Lumbosacral Transforaminal Epidural Steroid Injection - Sub-Pedicular Approach with Fluoroscopic Guidance  Patient: Joan Elliott      Date of Birth: 1940-08-02 MRN: [id number] PCP: [name], MD      Visit Date: 10/24/2017   Universal Protocol:    Date/Time: 10/24/2017  Consent Given By: the patient  Position: PRONE  Additional Comments: Vital signs were monitored before and after the procedure. Patient was prepped and draped in the usual sterile fashion. The correct patient, procedure, and site was verified.   Injection Procedure Details:  Procedure Site One Meds Administered:  Meds ordered this encounter  Medications  . betamethasone acetate-betamethasone sodium phosphate (CELESTONE) injection 12 mg    Laterality: Bilateral  Location/Site:  L4-L5  Needle size: 22 G  Needle type: Spinal  Needle Placement: Transforaminal  Findings:    -Comments: Excellent flow of contrast along the nerve and into the epidural space.  Procedure Details: After squaring off the end-plates to get a true AP view, the C-arm was positioned so that an oblique view of the foramen as noted above was visualized. The target area is just inferior to the "nose of the scotty dog" or sub pedicular. The soft tissues overlying this structure were infiltrated with 2-3 ml. of 1% Lidocaine without Epinephrine.  The spinal needle was inserted toward the target using a "trajectory" view along the fluoroscope beam.  Under AP and lateral visualization, the needle was advanced so it did not puncture dura and was located close the 6 O'Clock position of the pedical in AP tracterory. Biplanar projections were used to confirm position. Aspiration was confirmed to be negative for CSF and/or blood. A 1-2 ml. volume of Isovue-250 was injected and flow of contrast was noted at each level. Radiographs were obtained for documentation purposes.   After attaining the desired flow of contrast documented above, a 0.5  to 1.0 ml test dose of 0.25% Marcaine was injected into each respective transforaminal space.  The patient was observed for 90 seconds post injection.  After no sensory deficits were reported, and normal lower extremity motor function was noted,   the above injectate was administered so that equal amounts of the injectate were placed at each foramen (level) into the transforaminal epidural space.   Additional Comments:  The patient tolerated the procedure well Dressing: Band-Aid    Post-procedure details: Patient was observed during the procedure. Post-procedure instructions were reviewed.  Patient left the clinic in stable condition.

## 2017-10-25 NOTE — Progress Notes (Signed)
Joan Elliott - 77 y.o. female MRN [id number]  Date of birth: [birth year]  Office Visit Note: Visit Date: 10/24/2017 PCP: [name], MD Referred by: [name], MD  Subjective: Chief Complaint  Patient presents with  . Lower Back - Pain  . Right Leg - Pain  . Left Leg - Pain   HPI: [name] is a 77 year old female that comes in today at the request of Dr. Louanne Skye for bilateral L4 transforaminal epidural steroid injection for multifactorial severe stenosis and listhesis of L4 on L5 with concordant low back pain and bilateral radicular leg pain.  The injection  will be diagnostic and hopefully therapeutic. The patient has failed conservative care including time, medications and activity modification.   ROS Otherwise per HPI.  Assessment & Plan: Visit Diagnoses:  1. Lumbar radiculopathy     Plan: No additional findings.   Meds & Orders:  Meds ordered this encounter  Medications  . betamethasone acetate-betamethasone sodium phosphate (CELESTONE) injection 12 mg    Orders Placed This Encounter  Procedures  . XR C-ARM NO REPORT  . Epidural Steroid injection    Follow-up: Return for Dr. Louanne Skye as scheduled.   Procedures: No procedures performed  Lumbosacral Transforaminal Epidural Steroid Injection - Sub-Pedicular Approach with Fluoroscopic Guidance  Patient: Joan Elliott      Date of Birth: [birth year] MRN: [id number] PCP: [name], MD      Visit Date: 10/24/2017   Universal Protocol:    Date/Time: 10/24/2017  Consent Given By: the patient  Position: PRONE  Additional Comments: Vital signs were monitored before and after the procedure. Patient was prepped and draped in the usual sterile fashion. The correct patient, procedure, and site was verified.   Injection Procedure Details:  Procedure Site One Meds Administered:  Meds ordered this encounter  Medications  . betamethasone acetate-betamethasone sodium phosphate (CELESTONE) injection 12 mg      Laterality: Bilateral  Location/Site:  L4-L5  Needle size: 22 G  Needle type: Spinal  Needle Placement: Transforaminal  Findings:    -Comments: Excellent flow of contrast along the nerve and into the epidural space.  Procedure Details: After squaring off the end-plates to get a true AP view, the C-arm was positioned so that an oblique view of the foramen as noted above was visualized. The target area is just inferior to the "nose of the scotty dog" or sub pedicular. The soft tissues overlying this structure were infiltrated with 2-3 ml. of 1% Lidocaine without Epinephrine.  The spinal needle was inserted toward the target using a "trajectory" view along the fluoroscope beam.  Under AP and lateral visualization, the needle was advanced so it did not puncture dura and was located close the 6 O'Clock position of the pedical in AP tracterory. Biplanar projections were used to confirm position. Aspiration was confirmed to be negative for CSF and/or blood. A 1-2 ml. volume of Isovue-250 was injected and flow of contrast was noted at each level. Radiographs were obtained for documentation purposes.   After attaining the desired flow of contrast documented above, a 0.5 to 1.0 ml test dose of 0.25% Marcaine was injected into each respective transforaminal space.  The patient was observed for 90 seconds post injection.  After no sensory deficits were reported, and normal lower extremity motor function was noted,   the above injectate was administered so that equal amounts of the injectate were placed at each foramen (level) into the transforaminal epidural space.   Additional Comments:  The patient  tolerated the procedure well Dressing: Band-Aid    Post-procedure details: Patient was observed during the procedure. Post-procedure instructions were reviewed.  Patient left the clinic in stable condition.    Clinical History: MRI LUMBAR SPINE WITHOUT CONTRAST  TECHNIQUE: Multiplanar,  multisequence MR imaging of the lumbar spine was performed. No intravenous contrast was administered.  COMPARISON:  Radiography 04/24/2017  FINDINGS: Segmentation:  5 lumbar type vertebral bodies.  Alignment:  5 mm anterolisthesis L4-5.  Vertebrae:  Normal  Conus medullaris and cauda equina: Conus extends to the lower L2 level. Conus and cauda equina appear normal.  Paraspinal and other soft tissues: Negative  Disc levels:  Minimal, non-compressive disc bulges at L2-3 and above.  L3-4: Mild bulging of the disc. Facet and ligamentous hypertrophy. Mild to moderate multifactorial stenosis.  L4-5: Bilateral facet arthropathy with 5 mm of anterolisthesis. Mild bulging of the disc. Severe multifactorial stenosis likely to cause neural compression.  L5-S1: Bulging of the disc more towards the left. Facet hypertrophy. No compressive stenosis.  IMPRESSION: At L4-5, there is severe multifactorial spinal stenosis secondary to hypertrophic facet arthropathy which allows anterolisthesis of 5 mm, in combination with the bulging disc. Neural compression quite likely at this level.  L3-4 shows mild to moderate multifactorial stenosis secondary to facet degeneration and hypertrophy combination with mild bulging of the disc.   Electronically Signed   By: Nelson Chimes M.D.   On: 09/29/2017 13:05   She reports that she has been smoking cigarettes.  She has been smoking about 0.50 packs per day. She has never used smokeless tobacco. No results for input(s): HGBA1C, LABURIC in the last 8760 hours.  Objective:  VS:  HT:    WT:   BMI:     BP:129/80  HR:97bpm  TEMP:98.4 F (36.9 C)(Oral)  RESP:98 % Physical Exam  Ortho Exam Imaging: Xr C-arm No Report  Result Date: 10/24/2017 Please see Notes or Procedures tab for imaging impression.   Past Medical/Family/Surgical/Social History: Medications & Allergies reviewed per EMR, new medications updated. Patient Active  Problem List   Diagnosis Date Noted  . Cervicalgia 03/07/2017  . Chronic right shoulder pain 03/07/2017  . Chronic left shoulder pain 03/07/2017  . Chest pain, rule out acute myocardial infarction   . Chest pain 02/23/2015  . Hypokalemia 02/23/2015  . AKI (acute kidney injury) ([place]) 02/23/2015  . Diarrhea 02/23/2015  . Hypertension   . Coronary artery disease   . Thyroid disease   . Tobacco abuse   . Hypothyroidism   . HLD (hyperlipidemia)   . Gout   . GERD (gastroesophageal reflux disease)    Past Medical History:  Diagnosis Date  . Arthritis   . COPD (chronic obstructive pulmonary disease) ([place])   . Coronary artery disease    s/p stent mid RCA 2003  . GERD (gastroesophageal reflux disease)   . Gout   . HLD (hyperlipidemia)   . Hypertension   . Hypothyroidism   . Stroke ([place])   . Thyroid disease   . Tobacco abuse    Family History  Problem Relation Age of Onset  . Hypertension Mother   . Kidney disease Mother   . Hypertension Brother   . Hypertension Sister    Past Surgical History:  Procedure Laterality Date  . ABDOMINAL HYSTERECTOMY    . JOINT REPLACEMENT Right   . THYROID SURGERY     Social History   Occupational History  . Not on file  Tobacco Use  . Smoking status: Current Some Day  Smoker    Packs/day: 0.50    Types: Cigarettes  . Smokeless tobacco: Never Used  Substance and Sexual Activity  . Alcohol use: No  . Drug use: No  . Sexual activity: Not on file

## 2017-10-30 ENCOUNTER — Encounter: Payer: Self-pay | Admitting: Cardiology

## 2017-11-02 ENCOUNTER — Encounter (INDEPENDENT_AMBULATORY_CARE_PROVIDER_SITE_OTHER): Payer: Self-pay | Admitting: Specialist

## 2017-11-02 ENCOUNTER — Ambulatory Visit (INDEPENDENT_AMBULATORY_CARE_PROVIDER_SITE_OTHER): Payer: Medicare HMO | Admitting: Specialist

## 2017-11-02 VITALS — BP 129/70 | HR 73 | Ht 60.0 in | Wt 212.0 lb

## 2017-11-02 DIAGNOSIS — Z96651 Presence of right artificial knee joint: Secondary | ICD-10-CM | POA: Diagnosis not present

## 2017-11-02 DIAGNOSIS — M4316 Spondylolisthesis, lumbar region: Secondary | ICD-10-CM | POA: Diagnosis not present

## 2017-11-02 DIAGNOSIS — M48062 Spinal stenosis, lumbar region with neurogenic claudication: Secondary | ICD-10-CM

## 2017-11-02 NOTE — Patient Instructions (Signed)
Avoid bending, stooping and avoid lifting weights greater than 10 lbs. Avoid prolong standing and walking. Avoid frequent bending and stooping  No lifting greater than 10 lbs. May use ice or moist heat for pain. Weight loss is of benefit. Handicap license is approved. Call us if you have increasing pain and wish to have further bilateral ESIs, we would schedule the injections and then Dr. Romona Curls secretary/Assistant will call to arrange for epidural steroid injection

## 2017-11-02 NOTE — Progress Notes (Signed)
Office Visit Note   Patient: Joan Elliott           Date of Birth: [birth year]           MRN: [id number] Visit Date: 11/02/2017              Requested by: [name], MD [address] [place], [place] [address] PCP: [name], MD   Assessment & Plan: Visit Diagnoses:  1. Status post total right knee replacement   2. Spinal stenosis of lumbar region with neurogenic claudication   3. Spondylolisthesis, lumbar region     Plan: Avoid bending, stooping and avoid lifting weights greater than 10 lbs. Avoid prolong standing and walking. Avoid frequent bending and stooping  No lifting greater than 10 lbs. May use ice or moist heat for pain. Weight loss is of benefit. Handicap license is approved. Call us if you have increasing pain and wish to have further bilateral ESIs, we would schedule the injections and then Dr. Romona Curls secretary/Assistant will call to arrange for epidural steroid injection   Follow-Up Instructions: Return in about 2 months (around 01/02/2018).   Orders:  No orders of the defined types were placed in this encounter.  No orders of the defined types were placed in this encounter.     Procedures: No procedures performed   Clinical Data: No additional findings.   Subjective: Chief Complaint  Patient presents with  . Lower Back - Pain, Follow-up    S/p ESI (Dr. Ernestina Patches) 10/24/17    77 year old female with history of bilateral knee osteoarthritis and right hip and buttock areas. Worse with standing and walking. Saw Dr. Ernestina Patches 4/23 and had bilateral L4-5 TF ESIs for stenosis and she reports good improvement in the pain the left side is better than the right interms of improvement. She still is restricted in her standing and walking tolerance.  No bowel or bladder difficulties. She does not want to undergo surgical treatment and prefers a non surgical solution. Has not been to PT as yet. She does have a left knee that gives away.   Review of  Systems  Constitutional: Positive for activity change and chills. Negative for diaphoresis and unexpected weight change.  HENT: Positive for congestion, rhinorrhea, sinus pressure and sinus pain.   Eyes: Negative.  Negative for photophobia, pain, discharge, redness, itching and visual disturbance.  Respiratory: Positive for cough and wheezing. Negative for apnea, choking, chest tightness, shortness of breath and stridor.   Cardiovascular: Negative.  Negative for palpitations and leg swelling.  Gastrointestinal: Negative.  Negative for abdominal distention, abdominal pain, anal bleeding, blood in stool, constipation, diarrhea, nausea, rectal pain and vomiting.  Endocrine: Negative.  Negative for cold intolerance, heat intolerance, polydipsia, polyphagia and polyuria.  Genitourinary: Negative.  Negative for difficulty urinating, dyspareunia, dysuria, enuresis, flank pain, frequency and hematuria.  Musculoskeletal: Positive for arthralgias, back pain and gait problem. Negative for joint swelling, myalgias, neck pain and neck stiffness.  Skin: Negative.  Negative for color change, pallor, rash and wound.  Allergic/Immunologic: Positive for environmental allergies.  Neurological: Positive for weakness and numbness. Negative for dizziness, tremors, seizures, facial asymmetry, speech difficulty and headaches.  Hematological: Negative.  Negative for adenopathy. Does not bruise/bleed easily.  Psychiatric/Behavioral: Negative.  Negative for agitation, behavioral problems, confusion, decreased concentration, dysphoric mood, hallucinations, self-injury, sleep disturbance and suicidal ideas. The patient is not nervous/anxious and is not hyperactive.      Objective: Vital Signs: BP 129/70 (BP Location: Left Arm, Patient Position:  Sitting, Cuff Size: Large)   Pulse 73   Ht 5' (1.524 m)   Wt 212 lb (96.2 kg)   BMI 41.40 kg/m   Physical Exam  Constitutional: She is oriented to person, place, and time. She  appears well-developed and well-nourished.  HENT:  Head: Normocephalic and atraumatic.  Eyes: Pupils are equal, round, and reactive to light. EOM are normal.  Neck: Normal range of motion. Neck supple.  Pulmonary/Chest: Effort normal and breath sounds normal.  Abdominal: Soft. Bowel sounds are normal.  Neurological: She is alert and oriented to person, place, and time.  Skin: Skin is warm and dry.  Psychiatric: She has a normal mood and affect. Her behavior is normal. Judgment and thought content normal.    Back Exam   Tenderness  The patient is experiencing tenderness in the lumbar.  Range of Motion  Extension: abnormal  Flexion: normal  Lateral bend right: abnormal  Lateral bend left: abnormal  Rotation right: abnormal  Rotation left: abnormal   Muscle Strength  Right Quadriceps:  5/5  Left Quadriceps:  4/5  Right Hamstrings:  5/5  Left Hamstrings:  5/5   Tests  Straight leg raise right: negative Straight leg raise left: negative  Reflexes  Patellar: abnormal Achilles: abnormal Babinski's sign: normal   Other  Toe walk: normal Heel walk: abnormal Sensation: normal Gait: normal  Erythema: no back redness Scars: absent  Comments:  Weak left foot DF 4/5 and left knee extension 4/5      Specialty Comments:  No specialty comments available.  Imaging: No results found.   PMFS History: Patient Active Problem List   Diagnosis Date Noted  . Cervicalgia 03/07/2017  . Chronic right shoulder pain 03/07/2017  . Chronic left shoulder pain 03/07/2017  . Chest pain, rule out acute myocardial infarction   . Chest pain 02/23/2015  . Hypokalemia 02/23/2015  . AKI (acute kidney injury) ([place]) 02/23/2015  . Diarrhea 02/23/2015  . Hypertension   . Coronary artery disease   . Thyroid disease   . Tobacco abuse   . Hypothyroidism   . HLD (hyperlipidemia)   . Gout   . GERD (gastroesophageal reflux disease)    Past Medical History:  Diagnosis Date  . Arthritis    . COPD (chronic obstructive pulmonary disease) ([place])   . Coronary artery disease    s/p stent mid RCA 2003  . GERD (gastroesophageal reflux disease)   . Gout   . HLD (hyperlipidemia)   . Hypertension   . Hypothyroidism   . Stroke ([place])   . Thyroid disease   . Tobacco abuse     Family History  Problem Relation Age of Onset  . Hypertension Mother   . Kidney disease Mother   . Hypertension Brother   . Hypertension Sister     Past Surgical History:  Procedure Laterality Date  . ABDOMINAL HYSTERECTOMY    . JOINT REPLACEMENT Right   . THYROID SURGERY     Social History   Occupational History  . Not on file  Tobacco Use  . Smoking status: Current Some Day Smoker    Packs/day: 0.50    Types: Cigarettes  . Smokeless tobacco: Never Used  Substance and Sexual Activity  . Alcohol use: No  . Drug use: No  . Sexual activity: Not on file

## 2017-11-08 ENCOUNTER — Ambulatory Visit: Payer: Medicare HMO | Attending: Specialist | Admitting: Physical Therapy

## 2017-11-08 DIAGNOSIS — M6281 Muscle weakness (generalized): Secondary | ICD-10-CM | POA: Diagnosis present

## 2017-11-08 DIAGNOSIS — M5440 Lumbago with sciatica, unspecified side: Secondary | ICD-10-CM | POA: Diagnosis present

## 2017-11-08 DIAGNOSIS — R262 Difficulty in walking, not elsewhere classified: Secondary | ICD-10-CM | POA: Diagnosis present

## 2017-11-08 DIAGNOSIS — R2689 Other abnormalities of gait and mobility: Secondary | ICD-10-CM | POA: Diagnosis present

## 2017-11-08 DIAGNOSIS — G8929 Other chronic pain: Secondary | ICD-10-CM | POA: Insufficient documentation

## 2017-11-08 DIAGNOSIS — M79604 Pain in right leg: Secondary | ICD-10-CM | POA: Insufficient documentation

## 2017-11-09 NOTE — Therapy (Addendum)
[place], [place], [address] Phone: [phone]   Fax:  [phone]  Physical Therapy Evaluation/Discharge  Patient Details  Name: Joan Elliott MRN: [id number] Date of Birth: 07-01-1941 Referring Provider: Dr. Louanne Skye   Encounter Date: 11/08/2017  PT End of Session - 11/08/17 1036    Visit Number  1    Number of Visits  16    Date for PT Re-Evaluation  12/06/17 Pt will be gone for May, will return for first visit June     PT Start Time  1015    PT Stop Time  1105    PT Time Calculation (min)  50 min    Activity Tolerance  Patient tolerated treatment well    Behavior During Therapy  [facility] for tasks assessed/performed       Past Medical History:  Diagnosis Date  . Arthritis   . COPD (chronic obstructive pulmonary disease) ([place])   . Coronary artery disease    s/p stent mid RCA 2003  . GERD (gastroesophageal reflux disease)   . Gout   . HLD (hyperlipidemia)   . Hypertension   . Hypothyroidism   . Stroke ([place])   . Thyroid disease   . Tobacco abuse     Past Surgical History:  Procedure Laterality Date  . ABDOMINAL HYSTERECTOMY    . JOINT REPLACEMENT Right   . THYROID SURGERY      There were no vitals filed for this visit.   Subjective Assessment - 11/08/17 1022    Subjective  Patient has had back pain for many years.  She reports an improvement over the past few months due to having steriod injection.  She has low back pain and Rt LE numbness, burning along shin and Lt LE weakness.  She says her leg feels wet at times.  She has increased pain with standing, walking.      Pertinent History  COPD, HTN, OA, Rt TKA    Limitations  Lifting;Standing;Walking;House hold activities;Sitting;Other (comment)    How long can you sit comfortably?  not limited in a supportive chair     How long can you stand comfortably?  10 min     How long can you walk comfortably?  10 min     Diagnostic tests  MRI done ,  showed multifactorial stenosis, facet arthropathy at L4-L5-S1, 30m anterolisthesis L4-L5    Patient Stated Goals  Patient would like to be able to walk better and lean over so much.     Currently in Pain?  Yes    Pain Score  6     Pain Location  Back    Pain Orientation  Right;Left    Pain Descriptors / Indicators  Throbbing;Nagging    Pain Type  Chronic pain    Pain Radiating Towards  both legs thighs, buttocks     Pain Onset  More than a month ago    Pain Frequency  Constant    Aggravating Factors   standing, walking     Pain Relieving Factors  pain medicine, sitting    Effect of Pain on Daily Activities  limits her independence          [facility] 11[facility] 0001      Assessment   Medical Diagnosis  spinal stenosis, spondylolisthesis     Referring Provider  Dr. NLouanne Skye   Onset Date/Surgical Date  -- chronic    Prior Therapy  Yes for cervical spine  Precautions   Precautions  None      Restrictions   Weight Bearing Restrictions  No      Balance Screen   Has the patient fallen in the past 6 months  No    Has the patient had a decrease in activity level because of a fear of falling?   Yes    Is the patient reluctant to leave their home because of a fear of falling?   No      Home Environment   Living Environment  Private residence    Living Arrangements  Alone    Type of [place] Access  Level entry    [place] - 4 wheels      Prior Function   Vocation  Retired    Leisure  travelling, TV       Cognition   Overall Cognitive Status  Within Functional Limits for tasks assessed      Sensation   Light Touch  Appears Intact      Posture/Postural Control   Posture/Postural Control  Postural limitations    Postural Limitations  Rounded Shoulders;Forward head;Increased thoracic kyphosis;Flexed trunk    Posture Comments  leans to R in sitting , uncomfortable       AROM   Lumbar Flexion  just distal to shin     Lumbar  Extension  unable     Lumbar - Right Side Bend  50% pain    Lumbar - Left Side Bend  50% pain    Lumbar - Right Rotation  50%    Lumbar - Left Rotation  50%      Strength   Right Hip Flexion  4/5    Left Hip Flexion  4/5    Right Knee Flexion  4+/5    Right Knee Extension  4/5    Left Knee Flexion  4+/5    Left Knee Extension  4/5    Right/Left Ankle  -- pain L ankle       Palpation   Spinal mobility  NT     Palpation comment  grossly tender throughout lumbar area, did not attempt prone due to pain      Bed Mobility   Bed Mobility  Supine to Sit;Sit to Supine    Supine to Sit  6: Modified independent (Device/Increase time)    Sit to Supine  6: Modified independent (Device/Increase time)      Ambulation/Gait   Ambulation Distance (Feet)  100 Feet    Assistive device  4-wheeled walker    Gait Pattern  Step-to pattern;Decreased stride length;Antalgic    Ambulation Surface  Level;Indoor        Objective measurements completed on examination: See above findings.      [facility] Adult PT Treatment/Exercise - 11/09/17 0001      Self-Care   Self-Care  Heat/Ice Application;Other Self-Care Comments    Other Self-Care Comments   HEP, eval findings, pain      Lumbar Exercises: Stretches   Active Hamstring Stretch  2 reps    Active Hamstring Stretch Limitations  seated HEP    Single Knee to Chest Stretch  2 reps;30 seconds    Single Knee to Chest Stretch Limitations  with sheet    Lower Trunk Rotation  10 seconds    Lower Trunk Rotation Limitations  x10       Modalities   Modalities  Moist Heat      Moist Heat Therapy  Number Minutes Moist Heat  10 Minutes    Moist Heat Location  Lumbar Spine             PT Education - 11/09/17 0603    Education provided  Yes    Education Details  PT/POC, HEP, arthritis/stenosis, anatomy, heat for muscle relaxation    Person(s) Educated  Patient    Methods  Explanation;Demonstration;Handout;Verbal cues    Comprehension   Verbalized understanding;Returned demonstration       PT Short Term Goals - 11/09/17 0608      PT SHORT TERM GOAL #1   Title  Pt will be I with initial HEP for trunk flexibility and LE strength, core     Time  4    Period  Weeks    Status  New    Target Date  12/06/17      PT SHORT TERM GOAL #2   Title  Pt will be able to report min improvement in back pain with ADLs, standing, cooking.      Time  4    Period  Weeks    Status  New    Target Date  12/06/17      PT SHORT TERM GOAL #3   Title  Other goals TBA as patient will not be back from a trip until June 3rd        PT Long Term Goals - 11/09/17 0607      PT LONG TERM GOAL #1   Title  Goals TBA as patient will not be back to PT until 12/04/17             Plan - 11/09/17 0728    Clinical Impression Statement  Pt presents for mod complexity eval of chronic intermittent radicular low back pain which she sought treatment for a few months ago.  She received a spinal injection so she did feel somewhat better.  Her pain has worsened and she has noticed more dyspnea even within her household. She has LE weakness, abnormal/poor posture and significant limitations in trunk flexibility.  She depends on her walker and can only be on her feet 5-10 min before she needs to sit due to severe pain in back and both legs.  UE limitations affect effective LE and trunk stretching. She was given 3 stretches to do as she will be travelling but once she returns in June she will be able to come 2 x per week.      History and Personal Factors relevant to plan of care:  Rt TKA, deconditioned, lives alone, neck pain and bilateral shoulder pain, arthritis, COPD    Clinical Presentation  Evolving    Clinical Presentation due to:  decline in function over the past few months.    Clinical Decision Making  Moderate    Rehab Potential  Good    PT Frequency  2x / week    PT Duration  8 weeks    PT Treatment/Interventions  ADLs/Self Care Home  Management;Electrical Stimulation;Functional mobility training;Neuromuscular re-education;Therapeutic activities;Moist Heat;Cryotherapy;Ultrasound;Gait training;Stair training;Balance training;Patient/family education;Therapeutic exercise;Manual techniques;Passive range of motion    PT Next Visit Plan  she will be out of town until June.  flexion based. Check the 3 stretches I gave her, balance/mobility screen (TUG, sit to stand or 2 min walk test?)     PT Home Exercise Plan  hamstring seated, lower trunk rot, knee to chest (flexion based stretches)     Consulted and Agree with Plan of Care  Patient  Patient will benefit from skilled therapeutic intervention in order to improve the following deficits and impairments:  Abnormal gait, Decreased balance, Decreased endurance, Decreased mobility, Difficulty walking, Hypomobility, Obesity, Improper body mechanics, Decreased range of motion, Decreased activity tolerance, Decreased strength, Increased fascial restricitons, Impaired flexibility, Impaired UE functional use, Postural dysfunction, Pain  Visit Diagnosis: Chronic bilateral low back pain with sciatica, sciatica laterality unspecified  Muscle weakness (generalized)  Difficulty in walking, not elsewhere classified  Other abnormalities of gait and mobility  Pain in right leg     Problem List Patient Active Problem List   Diagnosis Date Noted  . Cervicalgia 03/07/2017  . Chronic right shoulder pain 03/07/2017  . Chronic left shoulder pain 03/07/2017  . Chest pain, rule out acute myocardial infarction   . Chest pain 02/23/2015  . Hypokalemia 02/23/2015  . AKI (acute kidney injury) ([place]) 02/23/2015  . Diarrhea 02/23/2015  . Hypertension   . Coronary artery disease   . Thyroid disease   . Tobacco abuse   . Hypothyroidism   . HLD (hyperlipidemia)   . Gout   . GERD (gastroesophageal reflux disease)     PAA,JENNIFER 11/09/2017, 7:39 AM  [facility] [address] [place], [place], [address] Phone: [phone]   Fax:  [phone]  Name: Joan Elliott MRN: [id number] Date of Birth: 1940-08-26   Raeford Razor, PT 11/09/17 7:40 AM Phone: [phone] Fax: [phone]   PHYSICAL THERAPY DISCHARGE SUMMARY  Visits from Start of Care: 1  Current functional level related to goals / functional outcomes: unknown   Remaining deficits: unknown   Education / Equipment: Initial eval  Plan: Patient agrees to discharge.  Patient goals were not met. Patient is being discharged due to not returning since the last visit.  ?????    Raeford Razor, PT 01/08/18 10:18 AM Phone: [phone] Fax: [phone]

## 2017-12-04 ENCOUNTER — Ambulatory Visit: Payer: Medicare HMO | Admitting: Physical Therapy

## 2017-12-06 ENCOUNTER — Ambulatory Visit: Payer: Medicare HMO | Admitting: Physical Therapy

## 2017-12-11 ENCOUNTER — Ambulatory Visit: Payer: Medicare HMO | Admitting: Physical Therapy

## 2017-12-13 ENCOUNTER — Encounter: Payer: Medicare HMO | Admitting: Physical Therapy

## 2017-12-15 ENCOUNTER — Other Ambulatory Visit (INDEPENDENT_AMBULATORY_CARE_PROVIDER_SITE_OTHER): Payer: Self-pay | Admitting: Specialist

## 2017-12-15 NOTE — Telephone Encounter (Signed)
Gabapentin refill request 

## 2017-12-18 ENCOUNTER — Encounter: Payer: Medicare HMO | Admitting: Physical Therapy

## 2017-12-20 ENCOUNTER — Encounter: Payer: Medicare HMO | Admitting: Physical Therapy

## 2017-12-25 ENCOUNTER — Encounter: Payer: Medicare HMO | Admitting: Physical Therapy

## 2017-12-27 ENCOUNTER — Encounter: Payer: Medicare HMO | Admitting: Physical Therapy

## 2018-01-03 ENCOUNTER — Ambulatory Visit (INDEPENDENT_AMBULATORY_CARE_PROVIDER_SITE_OTHER): Payer: Medicare HMO | Admitting: Specialist

## 2018-01-03 ENCOUNTER — Encounter (INDEPENDENT_AMBULATORY_CARE_PROVIDER_SITE_OTHER): Payer: Self-pay | Admitting: Specialist

## 2018-01-03 VITALS — BP 124/80 | HR 80 | Ht 60.0 in | Wt 212.0 lb

## 2018-01-03 DIAGNOSIS — M48062 Spinal stenosis, lumbar region with neurogenic claudication: Secondary | ICD-10-CM | POA: Diagnosis not present

## 2018-01-03 DIAGNOSIS — R252 Cramp and spasm: Secondary | ICD-10-CM | POA: Diagnosis not present

## 2018-01-03 DIAGNOSIS — M4316 Spondylolisthesis, lumbar region: Secondary | ICD-10-CM

## 2018-01-03 MED ORDER — GABAPENTIN 100 MG PO CAPS: ORAL_CAPSULE | ORAL | 0 refills | Status: DC

## 2018-01-03 NOTE — Progress Notes (Addendum)
Office Visit Note   Patient: Joan Elliott           Date of Birth: Jun 09, 1941           MRN: [id number] Visit Date: 01/03/2018              Requested by: [name], MD [address] [place], [place] [address] PCP: [name], MD   Assessment & Plan: Visit Diagnoses:  1. Spinal stenosis of lumbar region with neurogenic claudication   2. Spondylolisthesis, lumbar region   3. Leg cramps     Plan:Avoid bending, stooping and avoid lifting weights greater than 10 lbs. Avoid prolong standing and walking. Avoid frequent bending and stooping  No lifting greater than 10 lbs. May use ice or moist heat for pain. Weight loss is of benefit. Handicap license is approved. Call if you wish to consider further epidural steroid injection please call us and we would contact Dr. Romona Curls secretary and Dr. Romona Curls secretary/Assistant will call to arrange for epidural steroid injection.  Best exercise would be stationary bicycle or pool walking.  Lumbar flexion exercises help but avoid backwards bending.  Consider CBD oil or pills as a way to decrease pain without narcotics.   CPK tested to see if there is muscle injury due to the Mevacor. Try coconut milk 1/2 glass a day for cramping. Try macarons for stomach upset and diarrhea. One or two a day.  Speak with Dr. Jeanie Cooks your primary care physician about whether you can consider lumbar surgery medically.  I believe that the nerve compression in your lower back is not likely to improve without considering Laminectomy of L3-4 and L4-5 and fusion at L4-5 to prevent further vertebra slipping and reccurence.  Follow-Up Instructions: Return in about 1 month (around 01/31/2018).   Orders:  No orders of the defined types were placed in this encounter.  No orders of the defined types were placed in this encounter.     Procedures: No procedures performed   Clinical Data: No additional findings.   Subjective: Chief Complaint    Patient presents with  . Lower Back - Follow-up    77 year old female with history of low back pain with difficulty standing and walking. She has leg and bilateral feet heaviness and neurogenic claudication symptoms. She notices dificuty with fatigue most of the day and trouble movings. No bladder difficulty, she notices diarrhea now.   Review of Systems  Constitutional: Negative.   HENT: Negative.   Eyes: Negative.   Respiratory: Negative.   Cardiovascular: Negative.   Gastrointestinal: Negative.   Endocrine: Negative.   Genitourinary: Negative.   Musculoskeletal: Negative.   Skin: Negative.   Allergic/Immunologic: Negative.   Neurological: Negative.   Hematological: Negative.   Psychiatric/Behavioral: Negative.      Objective: Vital Signs: BP 124/80 (BP Location: Left Arm, Patient Position: Sitting)   Pulse 80   Ht 5' (1.524 m)   Wt 212 lb (96.2 kg)   BMI 41.40 kg/m   Physical Exam  Constitutional: She is oriented to person, place, and time. She appears well-developed and well-nourished.  HENT:  Head: Normocephalic and atraumatic.  Eyes: Pupils are equal, round, and reactive to light. EOM are normal.  Neck: Normal range of motion. Neck supple.  Pulmonary/Chest: Effort normal and breath sounds normal.  Abdominal: Soft. Bowel sounds are normal.  Neurological: She is alert and oriented to person, place, and time.  Skin: Skin is warm and dry.  Psychiatric: She has a normal mood  and affect. Her behavior is normal. Judgment and thought content normal.    Back Exam   Tenderness  The patient is experiencing tenderness in the lumbar.  Range of Motion  Extension: abnormal  Flexion: normal  Lateral bend right: abnormal  Rotation right: abnormal  Rotation left: abnormal   Muscle Strength  Right Quadriceps:  5/5  Left Quadriceps:  5/5  Right Hamstrings:  5/5  Left Hamstrings:  5/5   Tests  Straight leg raise right: negative Straight leg raise left:  negative  Reflexes  Patellar: normal Achilles: normal Babinski's sign: normal   Other  Toe walk: normal Heel walk: normal Sensation: normal Gait: normal  Erythema: no back redness  Comments:  SLR negative bilat.  No focal motor deficit in sitting position.       Specialty Comments:  No specialty comments available.  Imaging: No results found.   PMFS History: Patient Active Problem List   Diagnosis Date Noted  . Cervicalgia 03/07/2017  . Chronic right shoulder pain 03/07/2017  . Chronic left shoulder pain 03/07/2017  . Chest pain, rule out acute myocardial infarction   . Chest pain 02/23/2015  . Hypokalemia 02/23/2015  . AKI (acute kidney injury) ([place]) 02/23/2015  . Diarrhea 02/23/2015  . Hypertension   . Coronary artery disease   . Thyroid disease   . Tobacco abuse   . Hypothyroidism   . HLD (hyperlipidemia)   . Gout   . GERD (gastroesophageal reflux disease)    Past Medical History:  Diagnosis Date  . Arthritis   . COPD (chronic obstructive pulmonary disease) ([place])   . Coronary artery disease    s/p stent mid RCA 2003  . GERD (gastroesophageal reflux disease)   . Gout   . HLD (hyperlipidemia)   . Hypertension   . Hypothyroidism   . Stroke ([place])   . Thyroid disease   . Tobacco abuse     Family History  Problem Relation Age of Onset  . Hypertension Mother   . Kidney disease Mother   . Hypertension Brother   . Hypertension Sister     Past Surgical History:  Procedure Laterality Date  . ABDOMINAL HYSTERECTOMY    . JOINT REPLACEMENT Right   . THYROID SURGERY     Social History   Occupational History  . Not on file  Tobacco Use  . Smoking status: Current Some Day Smoker    Packs/day: 0.50    Types: Cigarettes  . Smokeless tobacco: Never Used  Substance and Sexual Activity  . Alcohol use: No  . Drug use: No  . Sexual activity: Not on file

## 2018-01-03 NOTE — Patient Instructions (Addendum)
Avoid bending, stooping and avoid lifting weights greater than 10 lbs. Avoid prolong standing and walking. Avoid frequent bending and stooping  No lifting greater than 10 lbs. May use ice or moist heat for pain. Weight loss is of benefit. Handicap license is approved. Call if you wish to consider further epidural steroid injection please call us and we would contact Dr. Romona Curls secretary and Dr. Romona Curls secretary/Assistant will call to arrange for epidural steroid injection.  Best exercise would be stationary bicycle or pool walking.  Lumbar flexion exercises help but avoid backwards bending.  Consider CBD oil or pills as a way to decrease pain without narcotics.   CPK tested to see if there is muscle injury due to the Mevacor.  Try coconut milk 1/2 glass a day for cramping. Try macarons for stomach upset and diarrhea. One or two a day . Speak with Dr. Jeanie Cooks your primary care physician about whether you can consider lumbar surgery medically.  I believe that the nerve compression in your lower back is not likely to improve without considering Laminectomy of L3-4 and L4-5 and fusion at L4-5 to prevent further vertebra slipping and reccurence.

## 2018-01-03 NOTE — Addendum Note (Signed)
Addended by: Minda Ditto, Alyse Low N on: 01/03/2018 12:30 PM   Modules accepted: Orders

## 2018-01-09 ENCOUNTER — Telehealth (INDEPENDENT_AMBULATORY_CARE_PROVIDER_SITE_OTHER): Payer: Self-pay

## 2018-01-09 NOTE — Telephone Encounter (Signed)
[place] with [facility] would like an order to be faxed for lab work.  Patient is currently there.  Fax# is [phone], CB# is [phone].  Please advise.  Thank You.

## 2018-01-09 NOTE — Telephone Encounter (Signed)
Order faxed to them as requested, Alyse Low will follow up on the results.

## 2018-01-10 ENCOUNTER — Other Ambulatory Visit (INDEPENDENT_AMBULATORY_CARE_PROVIDER_SITE_OTHER): Payer: Self-pay | Admitting: Specialist

## 2018-01-10 LAB — CK: CK TOTAL: 233 U/L — AB (ref 29–143)

## 2018-01-12 ENCOUNTER — Other Ambulatory Visit (INDEPENDENT_AMBULATORY_CARE_PROVIDER_SITE_OTHER): Payer: Self-pay | Admitting: Radiology

## 2018-01-12 DIAGNOSIS — M4316 Spondylolisthesis, lumbar region: Secondary | ICD-10-CM

## 2018-01-12 DIAGNOSIS — R252 Cramp and spasm: Secondary | ICD-10-CM

## 2018-01-12 DIAGNOSIS — M48062 Spinal stenosis, lumbar region with neurogenic claudication: Secondary | ICD-10-CM

## 2018-01-12 NOTE — Telephone Encounter (Signed)
We have rec'd the labs

## 2018-01-12 NOTE — Telephone Encounter (Signed)
I called and spoke with Tiffany at [facility] and we are waiting for her results to be faxed to [place]

## 2018-01-19 NOTE — Telephone Encounter (Signed)
error 

## 2018-01-22 ENCOUNTER — Telehealth (INDEPENDENT_AMBULATORY_CARE_PROVIDER_SITE_OTHER): Payer: Self-pay | Admitting: Specialist

## 2018-01-22 NOTE — Telephone Encounter (Signed)
Patient called needing a call back concerning the results of her lab work. The number to contact patient is [phone]

## 2018-01-23 NOTE — Telephone Encounter (Signed)
Patient called needing a call back concerning the results of her lab work.---Please advise

## 2018-01-26 NOTE — Telephone Encounter (Signed)
I called and left a message on her voice mail and told her the CK level is elevated and she may wish to stop her  anticholesterol medication for 1-2 weeks and assess if her pain is better. If so then she may need to discuss with her  Primary care physician coming off her medication or changing to another or using a more natural way to decrease Her cholesterol.

## 2018-02-01 ENCOUNTER — Observation Stay (HOSPITAL_COMMUNITY)
Admission: EM | Admit: 2018-02-01 | Discharge: 2018-02-02 | Disposition: A | Discharge status: Home / Self Care | Payer: Medicare HMO | Attending: Family Medicine | Admitting: Family Medicine

## 2018-02-01 ENCOUNTER — Emergency Department (HOSPITAL_COMMUNITY): Payer: Medicare HMO

## 2018-02-01 ENCOUNTER — Encounter (HOSPITAL_COMMUNITY): Payer: Self-pay

## 2018-02-01 ENCOUNTER — Other Ambulatory Visit: Payer: Self-pay

## 2018-02-01 DIAGNOSIS — N183 Chronic kidney disease, stage 3 unspecified: Secondary | ICD-10-CM

## 2018-02-01 DIAGNOSIS — R079 Chest pain, unspecified: Secondary | ICD-10-CM | POA: Diagnosis not present

## 2018-02-01 DIAGNOSIS — R0789 Other chest pain: Principal | ICD-10-CM | POA: Insufficient documentation

## 2018-02-01 DIAGNOSIS — J449 Chronic obstructive pulmonary disease, unspecified: Secondary | ICD-10-CM | POA: Diagnosis not present

## 2018-02-01 DIAGNOSIS — Z8673 Personal history of transient ischemic attack (TIA), and cerebral infarction without residual deficits: Secondary | ICD-10-CM | POA: Diagnosis not present

## 2018-02-01 DIAGNOSIS — F17210 Nicotine dependence, cigarettes, uncomplicated: Secondary | ICD-10-CM | POA: Diagnosis not present

## 2018-02-01 DIAGNOSIS — I10 Essential (primary) hypertension: Secondary | ICD-10-CM | POA: Insufficient documentation

## 2018-02-01 DIAGNOSIS — I700 Atherosclerosis of aorta: Secondary | ICD-10-CM

## 2018-02-01 DIAGNOSIS — E039 Hypothyroidism, unspecified: Secondary | ICD-10-CM | POA: Insufficient documentation

## 2018-02-01 DIAGNOSIS — N184 Chronic kidney disease, stage 4 (severe): Secondary | ICD-10-CM

## 2018-02-01 DIAGNOSIS — I739 Peripheral vascular disease, unspecified: Secondary | ICD-10-CM

## 2018-02-01 HISTORY — DX: Morbid (severe) obesity due to excess calories: E66.01

## 2018-02-01 HISTORY — DX: Atherosclerosis of aorta: I70.0

## 2018-02-01 HISTORY — DX: Chronic kidney disease, stage 4 (severe): N18.4

## 2018-02-01 HISTORY — DX: Peripheral vascular disease, unspecified: I73.9

## 2018-02-01 HISTORY — DX: Hypertensive heart disease without heart failure: I11.9

## 2018-02-01 LAB — BASIC METABOLIC PANEL
ANION GAP: 10 (ref 5–15)
BUN: 23 mg/dL (ref 8–23)
CALCIUM: 9.4 mg/dL (ref 8.9–10.3)
CO2: 24 mmol/L (ref 22–32)
Chloride: 109 mmol/L (ref 98–111)
Creatinine, Ser: 1.65 mg/dL — ABNORMAL HIGH (ref 0.44–1.00)
GFR calc Af Amer: 33 mL/min — ABNORMAL LOW (ref >60)
GFR, EST NON AFRICAN AMERICAN: 29 mL/min — AB (ref >60)
Glucose, Bld: 172 mg/dL — ABNORMAL HIGH (ref 70–99)
POTASSIUM: 3.9 mmol/L (ref 3.5–5.1)
SODIUM: 143 mmol/L (ref 135–145)

## 2018-02-01 LAB — CBC
HEMATOCRIT: 40.7 % (ref 36.0–46.0)
Hemoglobin: 13.4 g/dL (ref 12.0–15.0)
MCH: 28.5 pg (ref 26.0–34.0)
MCHC: 32.9 g/dL (ref 30.0–36.0)
MCV: 86.4 fL (ref 78.0–100.0)
Platelets: 269 10*3/uL (ref 150–400)
RBC: 4.71 MIL/uL (ref 3.87–5.11)
RDW: 14.8 % (ref 11.5–15.5)
WBC: 4.8 10*3/uL (ref 4.0–10.5)

## 2018-02-01 LAB — I-STAT TROPONIN, ED: TROPONIN I, POC: 0.01 ng/mL (ref 0.00–0.08)

## 2018-02-01 LAB — D-DIMER, QUANTITATIVE (NOT AT ARMC): D DIMER QUANT: 1.05 ug{FEU}/mL — AB (ref 0.00–0.50)

## 2018-02-01 MED ORDER — GI COCKTAIL ~~LOC~~
30.0000 mL | Freq: Once | ORAL | Status: AC
  Administered 2018-02-01: 30 mL via ORAL
  Filled 2018-02-01: qty 30

## 2018-02-01 MED ORDER — ALBUTEROL SULFATE (2.5 MG/3ML) 0.083% IN NEBU
3.0000 mL | INHALATION_SOLUTION | Freq: Four times a day (QID) | RESPIRATORY_TRACT | Status: DC | PRN
Start: 2018-02-01 — End: 2018-02-02

## 2018-02-01 MED ORDER — IPRATROPIUM-ALBUTEROL 0.5-2.5 (3) MG/3ML IN SOLN
3.0000 mL | Freq: Four times a day (QID) | RESPIRATORY_TRACT | Status: DC
  Administered 2018-02-02: 3 mL via RESPIRATORY_TRACT
  Filled 2018-02-01: qty 3

## 2018-02-01 MED ORDER — NICOTINE 7 MG/24HR TD PT24: 7.0000 mg | MEDICATED_PATCH | Freq: Every day | TRANSDERMAL | Status: DC

## 2018-02-01 MED ORDER — ALLOPURINOL 100 MG PO TABS
100.0000 mg | ORAL_TABLET | Freq: Every day | ORAL | Status: DC
  Administered 2018-02-02: 100 mg via ORAL
  Filled 2018-02-01: qty 1

## 2018-02-01 MED ORDER — ASPIRIN 81 MG PO CHEW: 324.0000 mg | CHEWABLE_TABLET | ORAL | Status: AC

## 2018-02-01 MED ORDER — METOPROLOL TARTRATE 25 MG PO TABS
12.5000 mg | ORAL_TABLET | Freq: Two times a day (BID) | ORAL | Status: DC
  Administered 2018-02-01 – 2018-02-02 (×2): 12.5 mg via ORAL
  Filled 2018-02-01 (×2): qty 1

## 2018-02-01 MED ORDER — ENOXAPARIN SODIUM 100 MG/ML ~~LOC~~ SOLN
95.0000 mg | Freq: Once | SUBCUTANEOUS | Status: AC
  Administered 2018-02-01: 95 mg via SUBCUTANEOUS
  Filled 2018-02-01: qty 0.95

## 2018-02-01 MED ORDER — NITROGLYCERIN 0.4 MG SL SUBL
0.4000 mg | SUBLINGUAL_TABLET | SUBLINGUAL | Status: DC | PRN
Start: 2018-02-01 — End: 2018-02-02

## 2018-02-01 MED ORDER — ATORVASTATIN CALCIUM 20 MG PO TABS: 20.0000 mg | ORAL_TABLET | Freq: Every day | ORAL | Status: DC

## 2018-02-01 MED ORDER — PANTOPRAZOLE SODIUM 40 MG PO TBEC: 40.0000 mg | DELAYED_RELEASE_TABLET | Freq: Every day | ORAL | Status: DC

## 2018-02-01 MED ORDER — CLOPIDOGREL BISULFATE 75 MG PO TABS: 75.0000 mg | ORAL_TABLET | Freq: Every day | ORAL | Status: DC

## 2018-02-01 MED ORDER — ENOXAPARIN SODIUM 100 MG/ML ~~LOC~~ SOLN
95.0000 mg | SUBCUTANEOUS | Status: DC
  Filled 2018-02-01: qty 1

## 2018-02-01 MED ORDER — PREDNISONE 20 MG PO TABS
60.0000 mg | ORAL_TABLET | Freq: Once | ORAL | Status: AC
Start: 2018-02-01 — End: 2018-02-01
  Administered 2018-02-01: 60 mg via ORAL
  Filled 2018-02-01: qty 3

## 2018-02-01 MED ORDER — ASPIRIN 81 MG PO CHEW
324.0000 mg | CHEWABLE_TABLET | Freq: Once | ORAL | Status: AC
  Administered 2018-02-01: 324 mg via ORAL
  Filled 2018-02-01: qty 4

## 2018-02-01 MED ORDER — CLOPIDOGREL BISULFATE 75 MG PO TABS
75.0000 mg | ORAL_TABLET | Freq: Every day | ORAL | Status: DC
  Administered 2018-02-01 – 2018-02-02 (×2): 75 mg via ORAL
  Filled 2018-02-01 (×2): qty 1

## 2018-02-01 MED ORDER — ACETAMINOPHEN 325 MG PO TABS
650.0000 mg | ORAL_TABLET | ORAL | Status: DC | PRN
  Administered 2018-02-01: 650 mg via ORAL
  Filled 2018-02-01: qty 2

## 2018-02-01 MED ORDER — IPRATROPIUM-ALBUTEROL 0.5-2.5 (3) MG/3ML IN SOLN
3.0000 mL | RESPIRATORY_TRACT | Status: DC
  Administered 2018-02-01 (×2): 3 mL via RESPIRATORY_TRACT
  Filled 2018-02-01 (×2): qty 3

## 2018-02-01 MED ORDER — LEVOTHYROXINE SODIUM 50 MCG PO TABS
50.0000 ug | ORAL_TABLET | Freq: Every day | ORAL | Status: DC
  Administered 2018-02-02: 50 ug via ORAL
  Filled 2018-02-01: qty 1

## 2018-02-01 MED ORDER — ONDANSETRON HCL 4 MG/2ML IJ SOLN: 4.0000 mg | Freq: Four times a day (QID) | INTRAMUSCULAR | Status: DC | PRN

## 2018-02-01 MED ORDER — NICOTINE 7 MG/24HR TD PT24
7.0000 mg | MEDICATED_PATCH | Freq: Every day | TRANSDERMAL | Status: DC
Start: 2018-02-01 — End: 2018-02-02
  Administered 2018-02-02: 7 mg via TRANSDERMAL
  Filled 2018-02-01 (×2): qty 1

## 2018-02-01 MED ORDER — ASPIRIN EC 81 MG PO TBEC
81.0000 mg | DELAYED_RELEASE_TABLET | Freq: Every day | ORAL | Status: DC
  Administered 2018-02-02: 81 mg via ORAL
  Filled 2018-02-01: qty 1

## 2018-02-01 MED ORDER — ASPIRIN 300 MG RE SUPP: 300.0000 mg | RECTAL | Status: AC

## 2018-02-01 NOTE — ED Provider Notes (Signed)
Patient placed in Quick Look pathway, seen and evaluated   Chief Complaint: Chest pain  HPI:   Patient reports she has had intermittent chest pain that she describes like a ball in her chest for the past 4 days.  She reports it is worse on exertion.  It does not radiate.  She has some shortness of breath at baseline but has not noticed any change in it.  She denies any recent long trips, surgeries, known cancer, new leg pain or swelling.  She has noticed some lightheadedness and chills with the chest pain, but no diaphoresis, nausea, vomiting, or abdominal pain.  Patient is on Plavix.  ROS: + Chest pain, shortness of breath, lightheadedness -abdominal pain, nausea, vomiting, diaphoresis  Physical Exam:   Gen: No distress  Neuro: Awake and Alert  Skin: Warm    Focused Exam: Tachycardic, lungs clear to auscultation, abdomen soft, nontender   Initiation of care has begun. The patient has been counseled on the process, plan, and necessity for staying for the completion/evaluation, and the remainder of the medical screening examination    Frederica Kuster, PA-C 02/01/18 1401    Mesner, Corene Cornea, MD 02/01/18 1513

## 2018-02-01 NOTE — H&P (Signed)
History and Physical  Jo[place] WGY:[id number] DOB: 29-Mar-1941 DOA: 02/01/2018  PCP: [name], MD   Chief Complaint: Chest pain  HPI:  77 yr old female prior stent to RCA 2003 in [place] from [place] smoker half pack per day, peripheral vascular disease, gout, COPD, OSA habitus, thyroidism HLD, BMI 41, poor respiratory reserve and can walk half a block without feeling winded comes in with a one-week history of shortness of breath and intermittent chest pain States pain is central chest radiation occasionally to the right arm-quality is burning and like a "tear"-no long travel no history of recent travel States worsened over the past 2 days and yesterday felt like "knot" --She is planning a trip to [place] to go gambling this weekend she thought she would come in to have a check and make sure that this is not anything serious    ED Course: Given GI cocktail x1 prednisone 601 DuoNeb x1 aspirin 324 and nuclear medicine scan is pending  Review of Systems:   Negative for fever, visual changes, sore throat, rash, new muscle aches, chest pain, SOB, dysuria, bleeding, n/v/abdominal pain.  She does not specifically state dysuria but says she has occasional diarrhea and difficulty passing urine  Past Medical History:  Diagnosis Date  . Arthritis   . COPD (chronic obstructive pulmonary disease) ([place])   . Coronary artery disease    s/p stent mid RCA 2003  . GERD (gastroesophageal reflux disease)   . Gout   . HLD (hyperlipidemia)   . Hypertension   . Hypothyroidism   . Stroke ([place])   . Thyroid disease   . Tobacco abuse     Past Surgical History:  Procedure Laterality Date  . ABDOMINAL HYSTERECTOMY    . JOINT REPLACEMENT Right   . THYROID SURGERY       reports that she has been smoking cigarettes.  She has been smoking about 0.50 packs per day. She has never used smokeless tobacco. She reports that she does not drink alcohol or use drugs. 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 Ivp Dye [Iodinated Diagnostic Agents] Nausea And Vomiting  . Oxycodone Itching  . Penicillins Rash    Has patient had a PCN reaction causing immediate rash, facial/tongue/throat swelling, SOB or lightheadedness with hypotension: Yes Has patient had a PCN reaction causing severe rash involving mucus membranes or skin necrosis: No Has patient had a PCN reaction that required hospitalization No Has patient had a PCN reaction occurring within the last 10 years: No If all of the above answers are "NO", then may proceed with Cephalosporin use.     Family History  Problem Relation Age of Onset  . Hypertension Mother   . Kidney disease Mother   . Hypertension Brother   . Hypertension Sister      Prior to Admission medications   Medication Sig Start Date End Date Taking? Authorizing Provider  acetaminophen-codeine (TYLENOL #4) 300-60 MG tablet Take 1 tablet by mouth 2 (two) times daily as needed (severe arthritis pain).  03/08/17   [provider]  albuterol (PROVENTIL HFA;VENTOLIN HFA) 108 (90 BASE) MCG/ACT inhaler Inhale 2 puffs into the lungs every 6 (six) hours as needed for wheezing or shortness of breath.  [provider]  albuterol (PROVENTIL) (2.5 MG/3ML) 0.083% nebulizer solution Take 2.5 mg by nebulization every 6 (six) hours as needed for wheezing or shortness of breath.    [provider]  allopurinol (ZYLOPRIM) 100 MG tablet Take 100 mg by mouth daily. 02/02/15   [provider]  Chlorphen-PE-Acetaminophen (NOREL AD) 4-10-325 MG TABS Take 1 tablet by mouth 2 (two) times daily as needed (congestion).    [provider]  Cholecalciferol (VITAMIN D PO) Take  1 tablet by mouth daily.    [provider]  clopidogrel (PLAVIX) 75 MG tablet Take 75 mg by mouth daily.    [provider]  COLCRYS 0.6 MG tablet  10/29/17   [provider]  diazepam (VALIUM) 5 MG tablet TAKE ONE TAB ONE HOUR PRIOR TO MRI REPEAT AS NEEDED #2 . ZERO REFILLS Patient not taking: Reported on 01/03/2018 09/14/17   [name], PA-C  diclofenac sodium (VOLTAREN) 1 % GEL Apply 4 (four) times daily topically.    [provider]  fexofenadine (ALLEGRA) 180 MG tablet Take 180 mg by mouth at bedtime.    [provider]  Fluticasone-Umeclidin-Vilant (TRELEGY ELLIPTA) 100-62.5-25 MCG/INH AEPB Inhale 1 day or 1 dose into the lungs.    [provider]  gabapentin (NEURONTIN) 100 MG capsule TAKE 1 CAPSULE(100 MG) BY MOUTH AT BEDTIME 01/03/18   [name], MD  levothyroxine (SYNTHROID, LEVOTHROID) 50 MCG tablet Take 50 mcg by mouth daily. 02/02/15   [provider]  losartan-hydrochlorothiazide (HYZAAR) 100-12.5 MG tablet Take 1 tablet by mouth daily.    [provider]  lovastatin (MEVACOR) 40 MG tablet Take 40 mg by mouth at bedtime. 12/24/14   [provider]  methocarbamol (ROBAXIN) 500 MG tablet Take 500 mg 2 (two) times daily by mouth.    [provider]  methylPREDNISolone (MEDROL) 4 MG tablet Medrol dose pack. Take as instructed Patient not taking: Reported on 01/03/2018 03/07/17   Mcarthur Rossetti, MD  nicotine (NICODERM CQ - DOSED IN MG/24 HR) 7 mg/24hr patch Place 7 mg onto the skin daily.    [provider]  oxybutynin (DITROPAN) 5 MG tablet TK 1 T PO QPM 12/15/17   [provider]  pantoprazole (PROTONIX) 40 MG tablet Take 40 mg by mouth daily. 02/02/15   [provider]  SUMAtriptan (IMITREX) 100 MG tablet Take 1 tablet (100 mg total) by mouth every 2 (two) hours as needed for migraine. May repeat in 2 hours if headache persists or recurs. Patient not taking:  Reported on 01/03/2018 10/05/16   [name], FNP  tiZANidine (ZANAFLEX) 4 MG tablet TAKE 1 TABLET(4 MG) BY MOUTH TWICE DAILY AS NEEDED FOR MUSCLE SPASMS Patient not taking: Reported on 01/03/2018 03/07/17   Mcarthur Rossetti, MD    Physical Exam:   Awake alert pleasant thick neck Mallampati 2, no thyromegaly no JVD no bruit  S1-S2 no murmur rub or gallop reproducible pain at epigastrium  Chest clinically clear without added sound  No rebound or guarding and abdomen  No lower extremity to trace edema  Neurologically intact without issue--moves all 4 limbs equally sensory is intact  I have personally reviewed following labs and imaging studies  Labs:   BUN/creatinine 23/1.6 point-of-care troponin 0 0.01 WBC and hemogram normal d-dimer elevated 1.04  Imaging studies:   Chest x-ray personally reviewed no new findings  Medical tests:   EKG independently reviewed: Shows no ST-T wave changes from prior and is  not disimilar from prior--the changes seen to me are Rate related  Test discussed with performing physician:  Yes -with Dr. Dayna Barker   Decision to obtain old records:   yes   Review and summation of old records:   y   Active Problems:   * No active hospital problems. *   Assessment/Plan Chest pain rule out-DDX either severe reflux versus possible PE which is less likely-ROMI with troponins placed on ACS pathway cardiology consulted given heart score greater than 4 had a Myoview in 2016 which was negative-she however still smoker and is 16 years out from her intervention--- with a firm diagnosis not in mind I feel it is reasonable to start Lovenox per pharmacy which can be discontinued by my partner tonight if the nuclear scan comes back as negative, although I strongly feel this is more likely reflux and anything else She tells me she does not take her aspirin and it looks like she has not taken any of her medications in a long time-I will restart aspirin and  Plavix and also dose metoprolol 12.5 twice daily, get a lipid panel in the morning start atorvastatin and we can go from there Smoker-counseled 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 etc.-last echo confirms this 02/24/2015-at that time had mild MR Hypothyroidism-continue meds and check a TSH in the morning BMI greater than 40-needs weight loss-counseled OSA habitus-needs screening for sleep apnea as an outpatient     Severity of Illness: The appropriate patient status for this patient is OBSERVATION. Observation status is judged to be reasonable and necessary in order to provide the required intensity of service to ensure the patient's safety. The patient's presenting symptoms, physical exam findings, and initial radiographic and laboratory data in the context of their medical condition is felt to place them at decreased risk for further clinical deterioration. Furthermore, it is anticipated that the patient will be medically stable for discharge from the hospital within 2 midnights of admission. The following factors support the patient status of observation.   " The patient's presenting symptoms include chest pain which is unclear etiology. " The physical exam findings include shortness of breath chest pain. " The initial radiographic and laboratory data are somewhat reassuring however need further diagnostic work-up.      DVT prophylaxis: Therapeutic Lovenox Code Status: DNR Family Communication: None Consults called: Cardiology  Time spent: 60 minutes  Verneita Griffes, MD [facility] 5:34 PM  02/01/2018, 5:34 PM

## 2018-02-01 NOTE — Progress Notes (Signed)
ANTICOAGULATION CONSULT NOTE - Initial Consult  Pharmacy Consult:  Lovenox Indication:  Rule out acute VTE  Allergies  Allergen Reactions  . Ivp Dye [Iodinated Diagnostic Agents] Nausea And Vomiting  . Oxycodone Itching  . Penicillins Rash    Has patient had a PCN reaction causing immediate rash, facial/tongue/throat swelling, SOB or lightheadedness with hypotension: Yes Has patient had a PCN reaction causing severe rash involving mucus membranes or skin necrosis: No Has patient had a PCN reaction that required hospitalization No Has patient had a PCN reaction occurring within the last 10 years: No If all of the above answers are "NO", then may proceed with Cephalosporin use.     Patient Measurements: Height: 5' (152.4 cm) Weight: 210 lb (95.3 kg) IBW/kg (Calculated) : 45.5   Vital Signs: Temp: 98.2 F (36.8 C) (08/01 1451) Temp Source: Oral (08/01 1451) BP: 118/64 (08/01 1800) Pulse Rate: 82 (08/01 1800)  Labs: Recent Labs    02/01/18 1358  HGB 13.4  HCT 40.7  PLT 269  CREATININE 1.65*    Estimated Creatinine Clearance: 29 mL/min (A) (by C-G formula based on SCr of 1.65 mg/dL (H)).   Medical History: Past Medical History:  Diagnosis Date  . Arthritis   . COPD (chronic obstructive pulmonary disease) ([place])   . Coronary artery disease    s/p stent mid RCA 2003  . GERD (gastroesophageal reflux disease)   . Gout   . HLD (hyperlipidemia)   . Hypertension   . Hypothyroidism   . Stroke ([place])   . Thyroid disease   . Tobacco abuse       Assessment: 9 YOF presented with chest pain and elevated d-dimer.  Patient recently traveled to [place].  Pharmacy has been consulted to dose Lovenox for rule out VTE.  She denies being on a blood thinner PTA.  Patient's has reduced renal clearance.  CBC WNL.     Goal of Therapy:  Anti-Xa level 0.6-1 units/ml 4hrs after LMWH dose given Monitor platelets by anticoagulation protocol: Yes    Plan:  Lovenox 95mg  SQ  Q24H CBC Q72H while on Lovenox, PRN BMET F/U with confirmation of VTE   Livian Vanderbeck D. Mina Marble, PharmD, BCPS, [place] 02/01/2018, 6:38 PM

## 2018-02-01 NOTE — Consult Note (Addendum)
Cardiology Consult    Patient ID: [id number] [place] MRN: [id number], DOB/AGE: August 21, 1940   Admit date: 02/01/2018 Date of Consult: 02/01/2018  Primary Physician: [name], MD Primary Cardiologist: Dr. Gwenlyn Found (2016) Requesting Provider: Dr. Verlon Au Reason for Consultation: Chest pain  [place] is a 77 y.o. female who is being seen today for the evaluation of chest pain at the request of Dr. Verlon Au.   Patient Profile    77 yo female with PMH of CAD s/p stent mRCA 2003, HTN, HL, Stroke, COPD, Tobacco use and thyroid disease who presented with chest pain and shortness of breath.   Past Medical History   Past Medical History:  Diagnosis Date  . Arthritis   . COPD (chronic obstructive pulmonary disease) ([place])   . Coronary artery disease    s/p stent mid RCA 2003  . GERD (gastroesophageal reflux disease)   . Gout   . HLD (hyperlipidemia)   . Hypertension   . Hypothyroidism   . Stroke ([place])   . Thyroid disease   . Tobacco abuse     Past Surgical History:  Procedure Laterality Date  . ABDOMINAL HYSTERECTOMY    . JOINT REPLACEMENT Right   . THYROID SURGERY       Allergies  Allergies  Allergen Reactions  . Ivp Dye [Iodinated Diagnostic Agents] Nausea And Vomiting  . Oxycodone Itching  . Penicillins Rash    Has patient had a PCN reaction causing immediate rash, facial/tongue/throat swelling, SOB or lightheadedness with hypotension: Yes Has patient had a PCN reaction causing severe rash involving mucus membranes or skin necrosis: No Has patient had a PCN reaction that required hospitalization No Has patient had a PCN reaction occurring within the last 10 years: No If all of the above answers are "NO", then may proceed with Cephalosporin use.     History of Present Illness    Joan [place] is a 77 yo female with PMH of CAD s/p mRCA 2013, HTN, HL, Stroke, COPD, Tobacco use and thyroid disease. She was seen back in 2016 by Dr. Gwenlyn Found for chest pain and ruled out with  troponins. Underwent a lexiscan that was normal. Since that time was lost to follow up. She continues to smoke daily. Echo from 8/16 showed EF of 50-55% with G1DD and LVH. Reports being deconditioned and only able to walk short distances without becoming short of breath. States she is followed by Dr. Jeanie Cooks as an outpatient.   States she began to feel bad on Monday. More short of breath, and felt like she had a knot in her chest. Also felt some fluttering in her chest as well. Deep breathing seemed to make the chest pain worse. Not affected by food or activity. Symptoms have been intermittent over the past couple of days. Also reports right upper thigh pain over the past couple of days. She says she has been compliant with her home medications. 1 pack of cigarettes will last her about 2-3 days. Ambulation is mostly limited 2/2 to her leg pain. Presented to the ED with her symptoms because she is planning to go out of town to [facility] this weekend and wanted to make sure everything is ok.   In the ED her labs showed stable electrolytes, POC trop neg x1, Hgb 13.4. Ddimer 1.05. CXR negative. Her EKG showed ST with TW depression in lateral leads. Noted improvement with follow up EKG. She was admitted to IM and cardiology asked to consult.   Inpatient Medications    . [  START ON 02/02/2018] enoxaparin (LOVENOX) injection  95 mg Subcutaneous Q24H  . ipratropium-albuterol  3 mL Nebulization Q4H    Family History    Family History  Problem Relation Age of Onset  . Hypertension Mother   . Kidney disease Mother   . Hypertension Brother   . Hypertension Sister     Social History    Social History   Socioeconomic History  . Marital status: Single    Spouse name: Not on file  . Number of children: Not on file  . Years of education: Not on file  . Highest education level: Not on file  Occupational History  . Not on file  Social Needs  . Financial resource strain: Not on file  . Food insecurity:      Worry: Not on file    Inability: Not on file  . Transportation needs:    Medical: Not on file    Non-medical: Not on file  Tobacco Use  . Smoking status: Current Some Day Smoker    Packs/day: 0.50    Types: Cigarettes  . Smokeless tobacco: Never Used  Substance and Sexual Activity  . Alcohol use: No  . Drug use: No  . Sexual activity: Not on file  Lifestyle  . Physical activity:    Days per week: Not on file    Minutes per session: Not on file  . Stress: Not on file  Relationships  . Social connections:    Talks on phone: Not on file    Gets together: Not on file    Attends religious service: Not on file    Active member of club or organization: Not on file    Attends meetings of clubs or organizations: Not on file    Relationship status: Not on file  . Intimate partner violence:    Fear of current or ex partner: Not on file    Emotionally abused: Not on file    Physically abused: Not on file    Forced sexual activity: Not on file  Other Topics Concern  . Not on file  Social History Narrative  . Not on file     Review of Systems    See HPI  All other systems reviewed and are otherwise negative except as noted above.  Physical Exam    Blood pressure 131/71, pulse 82, temperature 98.2 F (36.8 C), temperature source Oral, resp. rate 18, height 5' (1.524 m), weight 210 lb (95.3 kg), SpO2 100 %.  General: Obese older AAF, NAD Psych: Normal affect. Neuro: Alert and oriented X 3. Moves all extremities spontaneously. HEENT: Normal  Neck: Supple without bruits, no JVD. Lungs:  Resp regular and unlabored, CTA. Heart: RRR no s3, s4, or murmurs. Abdomen: Soft, non-tender, non-distended, BS + x 4.  Extremities: No clubbing, cyanosis or edema. Decreased pedal pulses. Scar to right knee.   Labs    Troponin (Point of Care Test) Recent Labs    02/01/18 1408  TROPIPOC 0.01   No results for input(s): CKTOTAL, CKMB, TROPONINI in the last 72 hours. Lab Results   Component Value Date   WBC 4.8 02/01/2018   HGB 13.4 02/01/2018   HCT 40.7 02/01/2018   MCV 86.4 02/01/2018   PLT 269 02/01/2018    Recent Labs  Lab 02/01/18 1358  NA 143  K 3.9  CL 109  CO2 24  BUN 23  CREATININE 1.65*  CALCIUM 9.4  GLUCOSE 172*   Lab Results  Component Value Date  CHOL 197 02/24/2015   HDL 58 02/24/2015   LDLCALC 118 (H) 02/24/2015   TRIG 105 02/24/2015   Lab Results  Component Value Date   DDIMER 1.05 (H) 02/01/2018     Radiology Studies    Dg Chest 2 View  Result Date: 02/01/2018 CLINICAL DATA:  Chest pain. EXAM: CHEST - 2 VIEW COMPARISON:  Radiographs of March 11, 2017. FINDINGS: The heart size and mediastinal contours are within normal limits. Both lungs are clear. No pneumothorax or pleural effusion is noted. Atherosclerosis of thoracic aorta is noted. The visualized skeletal structures are unremarkable. IMPRESSION: No active cardiopulmonary disease. Aortic Atherosclerosis (ICD10-I70.0). Electronically Signed   By: Marijo Conception, M.D.   On: 02/01/2018 14:39    ECG & Cardiac Imaging    EKG:  The EKG was personally reviewed and demonstrates ST diffuse ST depression in lateral leads  Echo: 02/24/15  Study Conclusions  - Left ventricle: The cavity size was normal. Wall thickness was   increased in a pattern of mild LVH. Systolic function was normal.   The estimated ejection fraction was in the range of 50% to 55%.   Wall motion was normal; there were no regional wall motion   abnormalities. Doppler parameters are consistent with abnormal   left ventricular relaxation (grade 1 diastolic dysfunction). - Mitral valve: Calcified annulus.  Impressions:  - Normal LV systolic function; grade 1 diastolic dysfunction;   trace MR.   Assessment & Plan    77 yo female with PMH of CAD s/p stent mRCA 2003, HTN, HL, Stroke, COPD, Tobacco use and thyroid disease who presented with chest pain and shortness of breath.  1. Atypical chest  pain: Reports intermittent episodes of chest pain over the past couple of days. EKG with ST depression with elevated rates, but improved on follow up EKG with rate improvement. Does have risk factors and continues to smoke. + Ddimer with plans for VQ scan per primary. Has also bee out of her "stomach" medications recently. Could be GI related.  -- cycle troponins -- check echo -- follow up with VQ scan results   2. CAD s/p stent to mRCA 2003: Reports being compliant with her medications. Has never really established care with cardiology since moving here to [facility]. Remains on daily ASA and plavix.   3. HTN: stable with current therapy  4. HL: on statin therapy -- check lipids  5. PAD: hx of the same. On ASA and plavix Cessation advised.   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 Baseline Cr in the past appears around 1.1, but recently increase to 1.6. Avoid nephrotoxic agents -- follow BMET  Signed, Reino Bellis, NP-C Pager [phone] 02/01/2018, 7:35 PM

## 2018-02-01 NOTE — ED Triage Notes (Signed)
Pt c/o CP reports that it felt like gas, chronic SOB - no changes, denies NV, c/o chills and feeling light headed.

## 2018-02-01 NOTE — ED Provider Notes (Signed)
Emergency Department Provider Note   I have reviewed the triage vital signs and the nursing notes.   HISTORY  Chief Complaint  Chest Pain   HPI Joan Elliott is a 77 y.o. female with multiple medical problems as documented below the presents to the emergency department today secondary to chest pain.  Patient states is been going on for couple days.  Is not related to exertion, eating, position.  She is had a little bit of shortness of breath but this seems to be more chronic.  She has had minimal cough.  No fevers or chills that she recognizes.no rashes.  No history of the same.  No recent surgeries, long car rides or cancer.  No other associated or modifying symptoms.    Past Medical History:  Diagnosis Date  . Aortic atherosclerosis ([place]) 02/01/2018  . Arthritis   . COPD (chronic obstructive pulmonary disease) ([place])   . Coronary artery disease    s/p stent mid RCA 2003  . GERD (gastroesophageal reflux disease)   . Gout   . HLD (hyperlipidemia)   . Hypertension   . Hypertensive heart disease without CHF   . Hypothyroidism   . Morbid obesity ([place]) 02/01/2018  . Peripheral vascular disease ([place]) 02/01/2018  . Stage 4 chronic kidney disease ([place]) 02/01/2018  . Stroke ([place])   . Tobacco abuse     Patient Active Problem List   Diagnosis Date Noted  . Aortic atherosclerosis ([place]) 02/01/2018  . Morbid obesity ([place]) 02/01/2018  . Stage 4 chronic kidney disease ([place]) 02/01/2018  . Peripheral vascular disease ([place]) 02/01/2018  . Chest pain 02/23/2015  . Hypertensive heart disease without CHF   . Coronary artery disease   . Tobacco abuse   . Hypothyroidism   . HLD (hyperlipidemia)   . Gout   . GERD (gastroesophageal reflux disease)     Past Surgical History:  Procedure Laterality Date  . ABDOMINAL HYSTERECTOMY    . REPLACEMENT TOTAL KNEE Right   . THYROID SURGERY        Allergies Ivp dye [iodinated diagnostic agents]; Oxycodone; and Penicillins  Family History    Problem Relation Age of Onset  . Hypertension Mother   . Kidney disease Mother   . Hypertension Brother   . Hypertension Sister     Social History Social History   Tobacco Use  . Smoking status: Current Some Day Smoker    Packs/day: 0.50    Types: Cigarettes  . Smokeless tobacco: Never Used  Substance Use Topics  . Alcohol use: No  . Drug use: No    Review of Systems  All other systems negative except as documented in the HPI. All pertinent positives and negatives as reviewed in the HPI. ____________________________________________   PHYSICAL EXAM:  VITAL SIGNS: ED Triage Vitals  Enc Vitals Group     BP 02/01/18 1351 120/74     Pulse Rate 02/01/18 1351 (!) 118     Resp 02/01/18 1351 18     Temp 02/01/18 1351 98.4 F (36.9 C)     Temp Source 02/01/18 1351 Oral     SpO2 02/01/18 1351 98 %     Weight 02/01/18 1357 210 lb (95.3 kg)     Height 02/01/18 1357 5' (1.524 m)    Constitutional: Alert and oriented. Well appearing and in no acute distress. Eyes: Conjunctivae are normal. PERRL. EOMI. Head: Atraumatic. Nose: No congestion/rhinnorhea. Mouth/Throat: Mucous membranes are moist.  Oropharynx non-erythematous. Neck: No stridor.  No meningeal signs.  Cardiovascular: Normal rate, regular rhythm. Good peripheral circulation. Grossly normal heart sounds.   Respiratory: Normal respiratory effort.  No retractions. Lungs diminished with mild wheezing. Gastrointestinal: Soft and nontender. No distention.  Musculoskeletal: No lower extremity tenderness nor edema. No gross deformities of extremities. Neurologic:  Normal speech and language. No gross focal neurologic deficits are appreciated.  Skin:  Skin is warm, dry and intact. No rash noted.   ____________________________________________   LABS (all labs ordered are listed, but only abnormal results are displayed)  Labs Reviewed  BASIC METABOLIC PANEL - Abnormal; Notable for the following components:      Result  Value   Glucose, Bld 172 (*)    Creatinine, Ser 1.65 (*)    GFR calc non Af Amer 29 (*)    GFR calc Af Amer 33 (*)    All other components within normal limits  D-DIMER, QUANTITATIVE (NOT AT [facility]) - Abnormal; Notable for the following components:   D-Dimer, Quant 1.05 (*)    All other components within normal limits  CBC  D-DIMER, QUANTITATIVE (NOT AT [facility])  LIPID PANEL  BASIC METABOLIC PANEL  I-STAT TROPONIN, ED   ____________________________________________  EKG   EKG Interpretation  Date/Time:  Thursday February 01 2018 13:53:31 EDT Ventricular Rate:  116 PR Interval:  134 QRS Duration: 106 QT Interval:  336 QTC Calculation: 467 R Axis:   15 Text Interpretation:  Sinus tachycardia Right atrial enlargement Possible Anterior infarct , age undetermined ST & T wave abnormality, consider lateral ischemia Abnormal ECG depressions in lateral leads , new TWI in i/avl similar to previous Confirmed by Merrily Pew [phone]) on 02/01/2018 3:16:35 PM Also confirmed by Merrily Pew [phone]), editor Clute, Jeannetta Nap [phone])  on 02/01/2018 3:24:57 PM     \  EKG Interpretation  Date/Time:  Thursday February 01 2018 15:54:23 EDT Ventricular Rate:  83 PR Interval:  134 QRS Duration: 112 QT Interval:  354 QTC Calculation: 416 R Axis:   -55 Text Interpretation:  Sinus rhythm Left anterior fascicular block Anterior infarct, old Borderline repolarization abnormality improved ST depressions laterally compared to earlier today Confirmed by Merrily Pew [phone]) on 02/01/2018 4:28:10 PM      ____________________________________________  RADIOLOGY  Dg Chest 2 View  Result Date: 02/01/2018 CLINICAL DATA:  Chest pain. EXAM: CHEST - 2 VIEW COMPARISON:  Radiographs of March 11, 2017. FINDINGS: The heart size and mediastinal contours are within normal limits. Both lungs are clear. No pneumothorax or pleural effusion is noted. Atherosclerosis of thoracic aorta is noted. The visualized skeletal  structures are unremarkable. IMPRESSION: No active cardiopulmonary disease. Aortic Atherosclerosis (ICD10-I70.0). Electronically Signed   By: Marijo Conception, M.D.   On: 02/01/2018 14:39    ____________________________________________   PROCEDURES  Procedure(s) performed:   Procedures   ____________________________________________   INITIAL IMPRESSION / ASSESSMENT AND PLAN / ED COURSE  Mild ST depressions may have been related to the right in the lateral leads.  However could be a sign of vessel disease.  D-dimer slightly elevated so needs evaluated from that standpoint however has not reaction to dye so we will get a VQ scan.  Heart score 5 so will need hospitalist admission and will discuss with them for the same.   Pertinent labs & imaging results that were available during my care of the patient were reviewed by me and considered in my medical decision making (see chart for details).  ____________________________________________  FINAL CLINICAL IMPRESSION(S) / ED DIAGNOSES  Final diagnoses:  Nonspecific chest pain  MEDICATIONS GIVEN DURING THIS VISIT:  Medications  albuterol (PROVENTIL) (2.5 MG/3ML) 0.083% nebulizer solution 3 mL (has no administration in time range)  allopurinol (ZYLOPRIM) tablet 100 mg (has no administration in time range)  clopidogrel (PLAVIX) tablet 75 mg (75 mg Oral Given 02/01/18 2328)  levothyroxine (SYNTHROID, LEVOTHROID) tablet 50 mcg (has no administration in time range)  aspirin EC tablet 81 mg (has no administration in time range)  nitroGLYCERIN (NITROSTAT) SL tablet 0.4 mg (has no administration in time range)  acetaminophen (TYLENOL) tablet 650 mg (650 mg Oral Given 02/01/18 2328)  ondansetron (ZOFRAN) injection 4 mg (has no administration in time range)  metoprolol tartrate (LOPRESSOR) tablet 12.5 mg (12.5 mg Oral Given 02/01/18 2323)  atorvastatin (LIPITOR) tablet 20 mg (has no administration in time range)  enoxaparin (LOVENOX)  injection 95 mg (has no administration in time range)  ipratropium-albuterol (DUONEB) 0.5-2.5 (3) MG/3ML nebulizer solution 3 mL (has no administration in time range)  nicotine (NICODERM CQ - dosed in mg/24 hr) patch 7 mg (has no administration in time range)  predniSONE (DELTASONE) tablet 60 mg (60 mg Oral Given 02/01/18 1530)  gi cocktail (Maalox,Lidocaine,Donnatal) (30 mLs Oral Given 02/01/18 1531)  aspirin chewable tablet 324 mg (324 mg Oral Given 02/01/18 1735)  aspirin chewable tablet 324 mg (0 mg Oral Duplicate 08/13/91 7169)    Or  aspirin suppository 300 mg ( Rectal See Alternative 02/01/18 2049)  enoxaparin (LOVENOX) injection 95 mg (95 mg Subcutaneous Given 02/01/18 1928)     NEW OUTPATIENT MEDICATIONS STARTED DURING THIS VISIT:  Current Discharge Medication List      Note:  This note was prepared with assistance of Dragon voice recognition software. Occasional wrong-word or sound-a-like substitutions may have occurred due to the inherent limitations of voice recognition software.   Maryan Sivak, Corene Cornea, MD 02/02/18 [phone]

## 2018-02-01 NOTE — ED Notes (Signed)
Pt. Put on pur wick

## 2018-02-02 ENCOUNTER — Observation Stay (HOSPITAL_BASED_OUTPATIENT_CLINIC_OR_DEPARTMENT_OTHER): Payer: Medicare HMO

## 2018-02-02 ENCOUNTER — Observation Stay (HOSPITAL_COMMUNITY): Payer: Medicare HMO

## 2018-02-02 DIAGNOSIS — I361 Nonrheumatic tricuspid (valve) insufficiency: Secondary | ICD-10-CM | POA: Diagnosis not present

## 2018-02-02 DIAGNOSIS — R079 Chest pain, unspecified: Secondary | ICD-10-CM | POA: Diagnosis not present

## 2018-02-02 DIAGNOSIS — I10 Essential (primary) hypertension: Secondary | ICD-10-CM | POA: Diagnosis not present

## 2018-02-02 DIAGNOSIS — J449 Chronic obstructive pulmonary disease, unspecified: Secondary | ICD-10-CM | POA: Diagnosis not present

## 2018-02-02 DIAGNOSIS — R0789 Other chest pain: Secondary | ICD-10-CM | POA: Diagnosis not present

## 2018-02-02 DIAGNOSIS — E039 Hypothyroidism, unspecified: Secondary | ICD-10-CM | POA: Diagnosis not present

## 2018-02-02 LAB — ECHOCARDIOGRAM COMPLETE
Height: 60 in
WEIGHTICAEL: 3301.61 [oz_av]

## 2018-02-02 LAB — BASIC METABOLIC PANEL
Anion gap: 10 (ref 5–15)
BUN: 27 mg/dL — AB (ref 8–23)
CHLORIDE: 106 mmol/L (ref 98–111)
CO2: 22 mmol/L (ref 22–32)
Calcium: 8.7 mg/dL — ABNORMAL LOW (ref 8.9–10.3)
Creatinine, Ser: 1.49 mg/dL — ABNORMAL HIGH (ref 0.44–1.00)
GFR calc Af Amer: 38 mL/min — ABNORMAL LOW (ref >60)
GFR calc non Af Amer: 32 mL/min — ABNORMAL LOW (ref >60)
GLUCOSE: 124 mg/dL — AB (ref 70–99)
POTASSIUM: 5.1 mmol/L (ref 3.5–5.1)
Sodium: 138 mmol/L (ref 135–145)

## 2018-02-02 LAB — LIPID PANEL
Cholesterol: 188 mg/dL (ref 0–200)
HDL: 66 mg/dL (ref >40)
LDL Cholesterol: 109 mg/dL — ABNORMAL HIGH (ref 0–99)
TRIGLYCERIDES: 67 mg/dL (ref <150)
Total CHOL/HDL Ratio: 2.8 RATIO
VLDL: 13 mg/dL (ref 0–40)

## 2018-02-02 LAB — TROPONIN I

## 2018-02-02 MED ORDER — PANTOPRAZOLE SODIUM 40 MG PO TBEC: 40.0000 mg | DELAYED_RELEASE_TABLET | Freq: Two times a day (BID) | ORAL | 3 refills | Status: AC

## 2018-02-02 MED ORDER — TECHNETIUM TO 99M ALBUMIN AGGREGATED
4.2000 | Freq: Once | INTRAVENOUS | Status: AC | PRN
  Administered 2018-02-02: 4.2 via INTRAVENOUS

## 2018-02-02 MED ORDER — CLOPIDOGREL BISULFATE 75 MG PO TABS: 75.0000 mg | ORAL_TABLET | Freq: Every day | ORAL | 12 refills | Status: AC

## 2018-02-02 MED ORDER — ATORVASTATIN CALCIUM 40 MG PO TABS: 40.0000 mg | ORAL_TABLET | Freq: Every day | ORAL | Status: DC

## 2018-02-02 MED ORDER — OXYBUTYNIN CHLORIDE 5 MG PO TABS: 5.0000 mg | ORAL_TABLET | Freq: Two times a day (BID) | ORAL | 1 refills | Status: DC

## 2018-02-02 MED ORDER — TECHNETIUM TC 99M DIETHYLENETRIAME-PENTAACETIC ACID
32.5000 | Freq: Once | INTRAVENOUS | Status: AC | PRN
  Administered 2018-02-02: 32.5 via INTRAVENOUS

## 2018-02-02 MED ORDER — GABAPENTIN 100 MG PO CAPS: ORAL_CAPSULE | ORAL | 1 refills | Status: AC

## 2018-02-02 MED ORDER — ALLOPURINOL 100 MG PO TABS: 100.0000 mg | ORAL_TABLET | Freq: Every day | ORAL | 1 refills | Status: AC

## 2018-02-02 MED ORDER — ATORVASTATIN CALCIUM 40 MG PO TABS: 40.0000 mg | ORAL_TABLET | Freq: Every day | ORAL | 0 refills | Status: DC

## 2018-02-02 MED ORDER — LEVOTHYROXINE SODIUM 50 MCG PO TABS: 50.0000 ug | ORAL_TABLET | Freq: Every day | ORAL | 1 refills | Status: DC

## 2018-02-02 MED ORDER — METOPROLOL TARTRATE 25 MG PO TABS: 12.5000 mg | ORAL_TABLET | Freq: Two times a day (BID) | ORAL | 0 refills | Status: DC

## 2018-02-02 NOTE — Progress Notes (Signed)
Progress Note  Patient Name: Joan Elliott Date of Encounter: 02/02/2018  Primary Cardiologist:   No primary care provider on file.   Subjective   She has not had chest pain.  She has some chronic dyspnea but was able to lie flat.    Inpatient Medications    Scheduled Meds: . allopurinol  100 mg Oral Daily  . aspirin EC  81 mg Oral Daily  . atorvastatin  20 mg Oral q1800  . clopidogrel  75 mg Oral Daily  . enoxaparin (LOVENOX) injection  95 mg Subcutaneous Q24H  . levothyroxine  50 mcg Oral QAC breakfast  . metoprolol tartrate  12.5 mg Oral BID  . nicotine  7 mg Transdermal Daily   Continuous Infusions:  PRN Meds: acetaminophen, albuterol, nitroGLYCERIN, ondansetron (ZOFRAN) IV   Vital Signs    Vitals:   02/01/18 1950 02/02/18 0554 02/02/18 0810 02/02/18 0843  BP: 137/79 112/62 (!) 144/51   Pulse: 87 73 74   Resp: 18 18    Temp: 98 F (36.7 C) 98 F (36.7 C)    TempSrc: Oral Oral    SpO2: 98% 99%  98%  Weight: 206 lb 9.6 oz (93.7 kg) 206 lb 5.6 oz (93.6 kg)    Height: 5' (1.524 m)       Intake/Output Summary (Last 24 hours) at 02/02/2018 1122 Last data filed at 02/02/2018 0000 Gross per 24 hour  Intake 480 ml  Output -  Net 480 ml   Filed Weights   02/01/18 1357 02/01/18 1950 02/02/18 0554  Weight: 210 lb (95.3 kg) 206 lb 9.6 oz (93.7 kg) 206 lb 5.6 oz (93.6 kg)    Telemetry    NSR, with PACs - Personally Reviewed  ECG    NA - Personally Reviewed  Physical Exam   GEN: No acute distress.   Neck: No  JVD Cardiac: RRR, no murmurs, rubs, or gallops.  Respiratory: Clear  to auscultation bilaterally. GI: Soft, nontender, non-distended  MS: No  edema; No deformity. Neuro:  Nonfocal  Psych: Normal affect   Labs    Chemistry Recent Labs  Lab 02/01/18 1358 02/02/18 0410  NA 143 138  K 3.9 5.1  CL 109 106  CO2 24 22  GLUCOSE 172* 124*  BUN 23 27*  CREATININE 1.65* 1.49*  CALCIUM 9.4 8.7*  GFRNONAA 29* 32*  GFRAA 33* 38*  ANIONGAP 10 10       Hematology Recent Labs  Lab 02/01/18 1358  WBC 4.8  RBC 4.71  HGB 13.4  HCT 40.7  MCV 86.4  MCH 28.5  MCHC 32.9  RDW 14.8  PLT 269    Cardiac EnzymesNo results for input(s): TROPONINI in the last 168 hours.  Recent Labs  Lab 02/01/18 1408  TROPIPOC 0.01     BNPNo results for input(s): BNP, PROBNP in the last 168 hours.   DDimer  Recent Labs  Lab 02/01/18 1629  DDIMER 1.05*    Lab Results  Component Value Date   CHOL 188 02/02/2018   TRIG 67 02/02/2018   HDL 66 02/02/2018   LDLCALC 109 (H) 02/02/2018    Radiology    Dg Chest 2 View  Result Date: 02/01/2018 CLINICAL DATA:  Chest pain. EXAM: CHEST - 2 VIEW COMPARISON:  Radiographs of March 11, 2017. FINDINGS: The heart size and mediastinal contours are within normal limits. Both lungs are clear. No pneumothorax or pleural effusion is noted. Atherosclerosis of thoracic aorta is noted. The visualized skeletal structures are  unremarkable. IMPRESSION: No active cardiopulmonary disease. Aortic Atherosclerosis (ICD10-I70.0). Electronically Signed   By: Marijo Conception, M.D.   On: 02/01/2018 14:39    Cardiac Studies   ECHO:  Pending  Patient Profile     77 y.o. female with PMH of CAD s/p stent mRCA 2003, HTN, HL, Stroke, COPD, Tobacco use and thyroid disease who presented with chest pain and shortness of breath.    Assessment & Plan    CHEST PAIN:   Dr. Wynonia Lawman suggested troponin and an echo.  However, I don't see enzymes other than the I-stat.  I will order.  I don't see the echo.  I will order.  If these are normal labs/unchanged echo then no further cardiac work up.   CAD:  As above.    HTN:  BP OK.  Continue current meds.     DYSLIPIDEMIA:   LDL not at target.  Increase Lipitor to 40 mg po daily and repeat lipid profile and liver enzymes in 8 weeks.   CKD IV:    Creat is stable.    For questions or updates, please contact [place] Please consult [email or web address] for contact info under  Cardiology/STEMI.   Signed, Minus Breeding, MD  02/02/2018, 11:22 AM

## 2018-02-02 NOTE — Progress Notes (Signed)
Patient received discharge information and acknowledged understanding of it. Patient IV was removed.  

## 2018-02-02 NOTE — Care Management Obs Status (Signed)
MEDICARE OBSERVATION STATUS NOTIFICATION   Patient Details  Name: Joan Elliott MRN: [id number] Date of Birth: 04/18/1941   Medicare Observation Status Notification Given:  Yes    Bethena Roys, RN 02/02/2018, 3:52 PM

## 2018-02-02 NOTE — Discharge Summary (Signed)
Physician Discharge Summary  Joan Elliott ZOX:[id number] DOB: 01-07-1941 DOA: 02/01/2018  PCP: [name], MD  Admit date: 02/01/2018 Discharge date: 02/02/2018  Time spent: 20 minutes  Recommendations for Outpatient Follow-up:  1. Follow up as OP with PCP  Discharge Diagnoses:  Active Problems:   Nonspecific chest pain   Aortic atherosclerosis (H[facility])   Morbid obesity ([facility])   Stage 4 chronic kidney disease ([place])   Peripheral vascular disease ([place])   Discharge Condition: Improved  Diet recommendation: Heart healthy  Filed Weights   02/01/18 1357 02/01/18 1950 02/02/18 0554  Weight: 95.3 kg (210 lb) 93.7 kg (206 lb 9.6 oz) 93.6 kg (206 lb 5.6 oz)    History of present illness:  This 77 year old female with prior CAD and stenting in 2003 was admitted overnight with chest pain-she had a d-dimer that was mildly elevated at 1.0 and had a negative nuclear scan-because of her continued smoking and heart score >5 she was seen by cardiology and an echo was ordered which showed no wall motion abnormalities however did show grade 2 diastolic dysfunction She is noncompliant with her medications at baseline and I prescribed all of her meds for her and call them into her pharmacy In addition she should not take any Goody powders and should double her Protonix dose until she is seen by her PCP and then back down after 2 months to regular dosing of Protonix she should also be on Plavix and I prescribed Lipitor as her LDL was slightly elevated She has met maximal hospital benefit from this hospitalization     Discharge Exam: Vitals:   02/02/18 0843 02/02/18 1431  BP:  128/68  Pulse:  76  Resp:    Temp:  98.4 F (36.9 C)  SpO2: 98% 100%    General: EOMI NCAT obese pleasant Cardiovascular: S1-S2 no murmur Respiratory: Clinically clear no added sound Abdomen obese nontender no rebound  Discharge Instructions   Discharge Instructions    Diet - low sodium heart healthy   Complete  by:  As directed    Discharge instructions   Complete by:  As directed    Your chest pain was probably not cardiac it seems like severe reflux and for that I have prescribed Protonix twice a day which she should continue at least for 2 to 3 months and then follow-up with the primary physician I notice you were not taking any of your home meds so I have given you at least 1 month supply with 1 refill on most of them Your cholesterol was high and you will need adjustment of your meds as an outpatient by her primary physician In addition I have discontinued 1 of your fluid pills and added metoprolol and aspirin Plavix back to your care-you will need these medications unless you have a severe intolerance-do not take any Goody powders or over-the-counter painkillers as this can cause irritation of your belly You will also need some labs in about 1 to 2 weeks at your primary physician's office Good luck and have a good summer   Increase activity slowly   Complete by:  As directed      Allergies as of 02/02/2018      Reactions   Ivp Dye [iodinated Diagnostic Agents] Nausea And Vomiting   Oxycodone Itching   Penicillins Rash   Has patient had a PCN reaction causing immediate rash, facial/tongue/throat swelling, SOB or lightheadedness with hypotension: Yes Has patient had a PCN reaction causing severe rash involving mucus membranes or  skin necrosis: No Has patient had a PCN reaction that required hospitalization No Has patient had a PCN reaction occurring within the last 10 years: No If all of the above answers are "NO", then may proceed with Cephalosporin use.      Medication List    STOP taking these medications   acetaminophen-codeine 300-60 MG tablet Commonly known as:  TYLENOL #4   albuterol (2.5 MG/3ML) 0.083% nebulizer solution Commonly known as:  PROVENTIL   albuterol 108 (90 Base) MCG/ACT inhaler Commonly known as:  PROVENTIL HFA;VENTOLIN HFA   COLCRYS 0.6 MG tablet Generic drug:   colchicine   diazepam 5 MG tablet Commonly known as:  VALIUM   fexofenadine 180 MG tablet Commonly known as:  ALLEGRA   fluticasone 50 MCG/ACT nasal spray Commonly known as:  FLONASE   losartan-hydrochlorothiazide 100-12.5 MG tablet Commonly known as:  HYZAAR   methylPREDNISolone 4 MG tablet Commonly known as:  MEDROL   NOREL AD 4-10-325 MG Tabs Generic drug:  Chlorphen-PE-Acetaminophen   SUMAtriptan 100 MG tablet Commonly known as:  IMITREX   tiZANidine 4 MG tablet Commonly known as:  ZANAFLEX     TAKE these medications   allopurinol 100 MG tablet Commonly known as:  ZYLOPRIM Take 1 tablet (100 mg total) by mouth daily.   atorvastatin 40 MG tablet Commonly known as:  LIPITOR Take 1 tablet (40 mg total) by mouth daily at 6 PM.   clopidogrel 75 MG tablet Commonly known as:  PLAVIX Take 1 tablet (75 mg total) by mouth daily.   gabapentin 100 MG capsule Commonly known as:  NEURONTIN TAKE 1 CAPSULE(100 MG) BY MOUTH AT BEDTIME   levothyroxine 50 MCG tablet Commonly known as:  SYNTHROID, LEVOTHROID Take 1 tablet (50 mcg total) by mouth daily.   metoprolol tartrate 25 MG tablet Commonly known as:  LOPRESSOR Take 0.5 tablets (12.5 mg total) by mouth 2 (two) times daily.   nicotine 7 mg/24hr patch Commonly known as:  NICODERM CQ - dosed in mg/24 hr Place 7 mg onto the skin daily as needed.   oxybutynin 5 MG tablet Commonly known as:  DITROPAN Take 1 tablet (5 mg total) by mouth 2 (two) times daily. What changed:  See the new instructions.   pantoprazole 40 MG tablet Commonly known as:  PROTONIX Take 1 tablet (40 mg total) by mouth 2 (two) times daily. What changed:  when to take this      Allergies  Allergen Reactions  . Ivp Dye [Iodinated Diagnostic Agents] Nausea And Vomiting  . Oxycodone Itching  . Penicillins Rash    Has patient had a PCN reaction causing immediate rash, facial/tongue/throat swelling, SOB or lightheadedness with hypotension:  Yes Has patient had a PCN reaction causing severe rash involving mucus membranes or skin necrosis: No Has patient had a PCN reaction that required hospitalization No Has patient had a PCN reaction occurring within the last 10 years: No If all of the above answers are "NO", then may proceed with Cephalosporin use.       The results of significant diagnostics from this hospitalization (including imaging, microbiology, ancillary and laboratory) are listed below for reference.    Significant Diagnostic Studies: Dg Chest 2 View  Result Date: 02/01/2018 CLINICAL DATA:  Chest pain. EXAM: CHEST - 2 VIEW COMPARISON:  Radiographs of March 11, 2017. FINDINGS: The heart size and mediastinal contours are within normal limits. Both lungs are clear. No pneumothorax or pleural effusion is noted. Atherosclerosis of thoracic aorta is noted. The  visualized skeletal structures are unremarkable. IMPRESSION: No active cardiopulmonary disease. Aortic Atherosclerosis (ICD10-I70.0). Electronically Signed   By: Marijo Conception, M.D.   On: 02/01/2018 14:39   Nm Pulmonary Vent And Perf (v/q Scan)  Result Date: 02/02/2018 CLINICAL DATA:  77 year old female with shortness of breath and intermittent chest pain for 1 week. EXAM: NUCLEAR MEDICINE VENTILATION - PERFUSION LUNG SCAN TECHNIQUE: Ventilation images were obtained in multiple projections using inhaled aerosol Tc-66mDTPA. Perfusion images were obtained in multiple projections after intravenous injection of Tc-955mAA. RADIOPHARMACEUTICALS:  32.5 mCi of Tc-9964mPA aerosol inhalation and 4.2 mCi Tc99m61m IV COMPARISON:  Chest radiographs 02/01/2018 FINDINGS: Ventilation: Incidental gastric contamination. Homogeneous ventilation activity in both lungs. No ventilation defect. Perfusion: Homogeneous perfusion radiotracer activity in both lungs. No perfusion defect. IMPRESSION: Normal VQ scan. Electronically Signed   By: H  HGenevie Ann.   On: 02/02/2018 14:13     Microbiology: No results found for this or any previous visit (from the past 240 hour(s)).   Labs: Basic Metabolic Panel: Recent Labs  Lab 02/01/18 1358 02/02/18 0410  NA 143 138  K 3.9 5.1  CL 109 106  CO2 24 22  GLUCOSE 172* 124*  BUN 23 27*  CREATININE 1.65* 1.49*  CALCIUM 9.4 8.7*   Liver Function Tests: No results for input(s): AST, ALT, ALKPHOS, BILITOT, PROT, ALBUMIN in the last 168 hours. No results for input(s): LIPASE, AMYLASE in the last 168 hours. No results for input(s): AMMONIA in the last 168 hours. CBC: Recent Labs  Lab 02/01/18 1358  WBC 4.8  HGB 13.4  HCT 40.7  MCV 86.4  PLT 269   Cardiac Enzymes: Recent Labs  Lab 02/02/18 1214  TROPONINI <0.03   BNP: BNP (last 3 results) No results for input(s): BNP in the last 8760 hours.  ProBNP (last 3 results) No results for input(s): PROBNP in the last 8760 hours.  CBG: No results for input(s): GLUCAP in the last 168 hours.     Signed:  Jai-Nita Sells  [facility] 02/02/2018, 4:52 PM

## 2018-02-02 NOTE — Care Management Note (Signed)
Case Management Note  Patient Details  Name: TASHEEMA PERRONE MRN: [id number] Date of Birth: [birth year]  Subjective/Objective: Pt presented for Chest Pain- PTA from home alone. Patient States that she has support from family members. Pt uses [facility] for appointments and gets her medications without any problems. Pt has DME: [facility] [employer] in the home                  Action/Plan: CM did discuss [place] with the patient. Pt feels like she will not need any HH Services at this time. CM did make patient aware that if she needs [place] in the future to contact her PCP. No further needs from CM at this time.   Expected Discharge Date:                  Expected Discharge Plan:  Home/Self Care  In-House Referral:  NA  Discharge planning Services  CM Consult  Post Acute Care Choice:  NA Choice offered to:  NA  DME Arranged:  N/A DME Agency:  NA  HH Arranged:  NA HH Agency:  NA  Status of Service:  Completed, signed off  If discussed at [place] of Stay Meetings, dates discussed:    Additional Comments:  Bethena Roys, RN 02/02/2018, 4:01 PM

## 2018-02-02 NOTE — Progress Notes (Signed)
  Echocardiogram 2D Echocardiogram has been performed.  Margaurite Salido G Rondall Radigan 02/02/2018, 3:14 PM

## 2018-02-22 ENCOUNTER — Encounter: Payer: Self-pay | Admitting: Cardiology

## 2018-03-17 NOTE — Progress Notes (Signed)
Cardiology Office Note   Date:  03/19/2018   ID:  Joan Elliott, DOB 1941-01-09, MRN [id number]  PCP:  [name], MD  Cardiologist:   No primary care provider on file.   Chief Complaint  Patient presents with  . Chest Pain      History of Present Illness: Joan Elliott is a 77 y.o. female who presents for follow up of CAD.   CAD s/p mRCA 2003.  She was seen back in 2016 by Dr. Gwenlyn Found for chest pain and ruled out with troponins. She underwent a lexiscan that was normal. Since that time was lost to follow up. She was in the hospital recently with chest pain that was thought to be nonanginal.  Echo was unremarkable.     Since going out of the hospital she is had a midsternal burning that was her previous discomfort on admission but she is continuing to get some pain in the left breast.  This is sharp.  Sporadic.  It seems to happen daily.  It might last for 5 to 15 minutes.  She might walk 100 yards or so to the grocery store with a walker routinely and she does not bring on the symptoms with this although this makes her very tired.  She has chronic dyspnea.  She is relatively sedentary walking with a cane or a walker.  She is not describing new PND or orthopnea.  Is not describing palpitations, presyncope or syncope.  The discomfort that she has does not radiate.  It goes away spontaneously.  It might be slightly reproducible with certain movements.  She cannot recall whether it is similar to the discomfort she had in 2003 when she had her stent.   Past Medical History:  Diagnosis Date  . Aortic atherosclerosis ([place]) 02/01/2018  . Arthritis   . COPD (chronic obstructive pulmonary disease) ([place])   . Coronary artery disease    s/p stent mid RCA 2003  . GERD (gastroesophageal reflux disease)   . Gout   . HLD (hyperlipidemia)   . Hypertension   . Hypertensive heart disease without CHF   . Hypothyroidism   . Morbid obesity ([place]) 02/01/2018  . Peripheral vascular disease ([place])  02/01/2018  . Stage 4 chronic kidney disease ([place]) 02/01/2018  . Stroke ([place])   . Tobacco abuse     Past Surgical History:  Procedure Laterality Date  . ABDOMINAL HYSTERECTOMY    . REPLACEMENT TOTAL KNEE Right   . THYROID SURGERY       Current Outpatient Medications  Medication Sig Dispense Refill  . allopurinol (ZYLOPRIM) 100 MG tablet Take 1 tablet (100 mg total) by mouth daily. 30 tablet 1  . atorvastatin (LIPITOR) 80 MG tablet Take 1 tablet (80 mg total) by mouth daily at 6 PM. 90 tablet 3  . clopidogrel (PLAVIX) 75 MG tablet Take 1 tablet (75 mg total) by mouth daily. 30 tablet 12  . gabapentin (NEURONTIN) 100 MG capsule TAKE 1 CAPSULE(100 MG) BY MOUTH AT BEDTIME 30 capsule 1  . levothyroxine (SYNTHROID, LEVOTHROID) 50 MCG tablet Take 1 tablet (50 mcg total) by mouth daily. 30 tablet 1  . metoprolol tartrate (LOPRESSOR) 25 MG tablet Take 0.5 tablets (12.5 mg total) by mouth 2 (two) times daily. 60 tablet 0  . oxybutynin (DITROPAN) 5 MG tablet Take 1 tablet (5 mg total) by mouth 2 (two) times daily. 30 tablet 1  . pantoprazole (PROTONIX) 40 MG tablet Take 1 tablet (40 mg total) by mouth  2 (two) times daily. 60 tablet 3  . nicotine (NICODERM CQ - DOSED IN MG/24 HR) 7 mg/24hr patch Place 7 mg onto the skin daily as needed.      No current facility-administered medications for this visit.     Allergies:   Ivp dye [iodinated diagnostic agents]; Oxycodone; and Penicillins    ROS:  Please see the history of present illness.   Otherwise, review of systems are positive for none.   All other systems are reviewed and negative.    PHYSICAL EXAM: VS:  BP 122/68   Pulse 85   Ht 5' (1.524 m)   Wt 204 lb (92.5 kg)   SpO2 98%   BMI 39.84 kg/m  , BMI Body mass index is 39.84 kg/m.  GENERAL:  Well appearing NECK:  No jugular venous distention, waveform within normal limits, carotid upstroke brisk and symmetric, no bruits, no thyromegaly LUNGS:  Clear to auscultation bilaterally CHEST:   Unremarkable HEART:  PMI not displaced or sustained,S1 and S2 within normal limits, no S3, no S4, no clicks, no rubs, no murmurs ABD:  Flat, positive bowel sounds normal in frequency in pitch, no bruits, no rebound, no guarding, no midline pulsatile mass, no hepatomegaly, no splenomegaly EXT:  2 plus pulses throughout, no edema, no cyanosis no clubbing    EKG:  EKG is not ordered today.   Recent Labs: 02/01/2018: Hemoglobin 13.4; Platelets 269 02/02/2018: BUN 27; Creatinine, Ser 1.49; Potassium 5.1; Sodium 138    Lipid Panel    Component Value Date/Time   CHOL 188 02/02/2018 0410   TRIG 67 02/02/2018 0410   HDL 66 02/02/2018 0410   CHOLHDL 2.8 02/02/2018 0410   VLDL 13 02/02/2018 0410   LDLCALC 109 (H) 02/02/2018 0410      Wt Readings from Last 3 Encounters:  03/19/18 204 lb (92.5 kg)  02/02/18 206 lb 5.6 oz (93.6 kg)  01/03/18 212 lb (96.2 kg)      Other studies Reviewed: Additional studies/ records that were reviewed today include:  Hospital records. Review of the above records demonstrates:  Please see elsewhere in the note.     ASSESSMENT AND PLAN:  CAD:   The patient has some chest discomfort that is somewhat atypical.  However, given the fact that she has ongoing risk factors and it is an ongoing discomfort she needs screening.  She would not be able walk on a treadmill so she will have a [employer].  HTN:  The blood pressure is at target. No change in medications is indicated. We will continue with therapeutic lifestyle changes (TLC).  DYSLIPIDEMIA:   She is not at target with her lipids and I will increase her Lipitor to 80 mg daily.  She will get a lipid profile liver enzymes in 8 weeks.  CKD IV:   Creatinine has been stable and as above.  No change in therapy.  TOBACCO ABUSE:   We talked about the need to quit smoking 4 cigarettes/day and that she is currently smoking.  PRE DM:   This is followed by [name], MD   Current medicines are reviewed  at length with the patient today.  The patient does not have concerns regarding medicines.  The following changes have been made:  no change  Labs/ tests ordered today include:   Orders Placed This Encounter  Procedures  . Lipid panel  . Hepatic function panel  . Myocardial Perfusion Imaging     Disposition:   FU with based on  the results of the above.      Signed, Minus Breeding, MD  03/19/2018 11:31 AM    [facility]

## 2018-03-19 ENCOUNTER — Ambulatory Visit: Payer: Medicare HMO | Admitting: Cardiology

## 2018-03-19 ENCOUNTER — Encounter: Payer: Self-pay | Admitting: Cardiology

## 2018-03-19 VITALS — BP 122/68 | HR 85 | Ht 60.0 in | Wt 204.0 lb

## 2018-03-19 DIAGNOSIS — I2510 Atherosclerotic heart disease of native coronary artery without angina pectoris: Secondary | ICD-10-CM

## 2018-03-19 DIAGNOSIS — E785 Hyperlipidemia, unspecified: Secondary | ICD-10-CM

## 2018-03-19 DIAGNOSIS — R079 Chest pain, unspecified: Secondary | ICD-10-CM | POA: Diagnosis not present

## 2018-03-19 DIAGNOSIS — N184 Chronic kidney disease, stage 4 (severe): Secondary | ICD-10-CM

## 2018-03-19 DIAGNOSIS — Z720 Tobacco use: Secondary | ICD-10-CM

## 2018-03-19 MED ORDER — ATORVASTATIN CALCIUM 80 MG PO TABS: 80.0000 mg | ORAL_TABLET | Freq: Every day | ORAL | 3 refills | Status: DC

## 2018-03-19 NOTE — Patient Instructions (Signed)
Medication Instructions:  Your physician has recommended you make the following change in your medication:  INCREASE Atorvastatin 80mg  daily. An Rx has been sent to your pharmacy   Labwork: Your physician recommends that you return for a FASTING lipid profile and hepatic panel   Testing/Procedures: Your physician has requested that you have a lexiscan myoview. For further information please visit [email or web address]. Please follow instruction sheet, as given.   Follow-Up: Your physician wants you to follow-up in: 1 year with Dr.Hochrein You will receive a reminder letter in the mail two months in advance. If you don't receive a letter, please call our office to schedule the follow-up appointment.   Any Other Special Instructions Will Be Listed Below (If Applicable).     If you need a refill on your cardiac medications before your next appointment, please call your pharmacy.  Marland Kitchen

## 2018-03-21 ENCOUNTER — Telehealth (HOSPITAL_COMMUNITY): Payer: Self-pay

## 2018-03-21 NOTE — Telephone Encounter (Signed)
Encounter complete. 

## 2018-03-23 ENCOUNTER — Ambulatory Visit (HOSPITAL_COMMUNITY)
Admission: RE | Admit: 2018-03-23 | Discharge: 2018-03-23 | Disposition: A | Discharge status: Home / Self Care | Payer: Medicare HMO | Source: Ambulatory Visit | Attending: Internal Medicine | Admitting: Internal Medicine

## 2018-03-23 DIAGNOSIS — R079 Chest pain, unspecified: Secondary | ICD-10-CM | POA: Diagnosis present

## 2018-03-23 DIAGNOSIS — I2510 Atherosclerotic heart disease of native coronary artery without angina pectoris: Secondary | ICD-10-CM | POA: Diagnosis not present

## 2018-03-23 LAB — MYOCARDIAL PERFUSION IMAGING
CHL CUP NUCLEAR SSS: 1
CHL CUP RESTING HR STRESS: 66 {beats}/min
CSEPPHR: 103 {beats}/min
LV sys vol: 27 mL
LVDIAVOL: 67 mL (ref 46–106)
SDS: 1
SRS: 0
TID: 1.18

## 2018-03-23 MED ORDER — TECHNETIUM TC 99M TETROFOSMIN IV KIT
10.1000 | PACK | Freq: Once | INTRAVENOUS | Status: AC | PRN
  Administered 2018-03-23: 10.1 via INTRAVENOUS
  Filled 2018-03-23: qty 11

## 2018-03-23 MED ORDER — AMINOPHYLLINE 25 MG/ML IV SOLN
75.0000 mg | Freq: Once | INTRAVENOUS | Status: AC
Start: 2018-03-23 — End: 2018-03-23
  Administered 2018-03-23: 75 mg via INTRAVENOUS

## 2018-03-23 MED ORDER — TECHNETIUM TC 99M TETROFOSMIN IV KIT
30.0000 | PACK | Freq: Once | INTRAVENOUS | Status: AC | PRN
  Administered 2018-03-23: 30 via INTRAVENOUS
  Filled 2018-03-23: qty 30

## 2018-03-23 MED ORDER — REGADENOSON 0.4 MG/5ML IV SOLN
0.4000 mg | Freq: Once | INTRAVENOUS | Status: AC
Start: 2018-03-23 — End: 2018-03-23
  Administered 2018-03-23: 0.4 mg via INTRAVENOUS

## 2018-04-05 ENCOUNTER — Other Ambulatory Visit: Payer: Self-pay

## 2018-04-05 ENCOUNTER — Ambulatory Visit (INDEPENDENT_AMBULATORY_CARE_PROVIDER_SITE_OTHER): Payer: Medicare HMO | Admitting: Specialist

## 2018-04-05 ENCOUNTER — Encounter (HOSPITAL_COMMUNITY): Payer: Self-pay

## 2018-04-05 ENCOUNTER — Emergency Department (HOSPITAL_COMMUNITY): Payer: Medicare HMO

## 2018-04-05 ENCOUNTER — Inpatient Hospital Stay (HOSPITAL_COMMUNITY)
Admission: EM | Admit: 2018-04-05 | Discharge: 2018-04-24 | DRG: 329 | Disposition: A | Discharge status: Home Health | Payer: Medicare HMO | Attending: Internal Medicine | Admitting: Internal Medicine

## 2018-04-05 DIAGNOSIS — N183 Chronic kidney disease, stage 3 unspecified: Secondary | ICD-10-CM | POA: Diagnosis present

## 2018-04-05 DIAGNOSIS — E876 Hypokalemia: Secondary | ICD-10-CM | POA: Diagnosis present

## 2018-04-05 DIAGNOSIS — D62 Acute posthemorrhagic anemia: Secondary | ICD-10-CM | POA: Diagnosis not present

## 2018-04-05 DIAGNOSIS — K631 Perforation of intestine (nontraumatic): Secondary | ICD-10-CM | POA: Diagnosis not present

## 2018-04-05 DIAGNOSIS — M109 Gout, unspecified: Secondary | ICD-10-CM | POA: Diagnosis present

## 2018-04-05 DIAGNOSIS — K6819 Other retroperitoneal abscess: Secondary | ICD-10-CM | POA: Diagnosis present

## 2018-04-05 DIAGNOSIS — R1110 Vomiting, unspecified: Secondary | ICD-10-CM

## 2018-04-05 DIAGNOSIS — K3532 Acute appendicitis with perforation and localized peritonitis, without abscess: Secondary | ICD-10-CM | POA: Diagnosis present

## 2018-04-05 DIAGNOSIS — Z955 Presence of coronary angioplasty implant and graft: Secondary | ICD-10-CM | POA: Diagnosis not present

## 2018-04-05 DIAGNOSIS — E785 Hyperlipidemia, unspecified: Secondary | ICD-10-CM | POA: Diagnosis present

## 2018-04-05 DIAGNOSIS — I5032 Chronic diastolic (congestive) heart failure: Secondary | ICD-10-CM | POA: Diagnosis present

## 2018-04-05 DIAGNOSIS — Z781 Physical restraint status: Secondary | ICD-10-CM | POA: Diagnosis not present

## 2018-04-05 DIAGNOSIS — Z8673 Personal history of transient ischemic attack (TIA), and cerebral infarction without residual deficits: Secondary | ICD-10-CM

## 2018-04-05 DIAGNOSIS — J449 Chronic obstructive pulmonary disease, unspecified: Secondary | ICD-10-CM | POA: Diagnosis present

## 2018-04-05 DIAGNOSIS — Z7902 Long term (current) use of antithrombotics/antiplatelets: Secondary | ICD-10-CM

## 2018-04-05 DIAGNOSIS — Z6841 Body Mass Index (BMI) 40.0 and over, adult: Secondary | ICD-10-CM | POA: Diagnosis not present

## 2018-04-05 DIAGNOSIS — K219 Gastro-esophageal reflux disease without esophagitis: Secondary | ICD-10-CM | POA: Diagnosis present

## 2018-04-05 DIAGNOSIS — K5730 Diverticulosis of large intestine without perforation or abscess without bleeding: Secondary | ICD-10-CM | POA: Diagnosis present

## 2018-04-05 DIAGNOSIS — K3533 Acute appendicitis with perforation and localized peritonitis, with abscess: Secondary | ICD-10-CM | POA: Diagnosis present

## 2018-04-05 DIAGNOSIS — K449 Diaphragmatic hernia without obstruction or gangrene: Secondary | ICD-10-CM | POA: Diagnosis present

## 2018-04-05 DIAGNOSIS — K9189 Other postprocedural complications and disorders of digestive system: Secondary | ICD-10-CM | POA: Diagnosis not present

## 2018-04-05 DIAGNOSIS — N390 Urinary tract infection, site not specified: Secondary | ICD-10-CM | POA: Diagnosis present

## 2018-04-05 DIAGNOSIS — I2510 Atherosclerotic heart disease of native coronary artery without angina pectoris: Secondary | ICD-10-CM | POA: Diagnosis present

## 2018-04-05 DIAGNOSIS — K660 Peritoneal adhesions (postprocedural) (postinfection): Secondary | ICD-10-CM | POA: Diagnosis present

## 2018-04-05 DIAGNOSIS — Z885 Allergy status to narcotic agent status: Secondary | ICD-10-CM

## 2018-04-05 DIAGNOSIS — I10 Essential (primary) hypertension: Secondary | ICD-10-CM | POA: Diagnosis present

## 2018-04-05 DIAGNOSIS — F17210 Nicotine dependence, cigarettes, uncomplicated: Secondary | ICD-10-CM | POA: Diagnosis present

## 2018-04-05 DIAGNOSIS — E039 Hypothyroidism, unspecified: Secondary | ICD-10-CM | POA: Diagnosis present

## 2018-04-05 DIAGNOSIS — R410 Disorientation, unspecified: Secondary | ICD-10-CM | POA: Diagnosis not present

## 2018-04-05 DIAGNOSIS — N179 Acute kidney failure, unspecified: Secondary | ICD-10-CM | POA: Diagnosis present

## 2018-04-05 DIAGNOSIS — K567 Ileus, unspecified: Secondary | ICD-10-CM | POA: Diagnosis not present

## 2018-04-05 DIAGNOSIS — N184 Chronic kidney disease, stage 4 (severe): Secondary | ICD-10-CM | POA: Diagnosis present

## 2018-04-05 DIAGNOSIS — Z452 Encounter for adjustment and management of vascular access device: Secondary | ICD-10-CM

## 2018-04-05 DIAGNOSIS — I130 Hypertensive heart and chronic kidney disease with heart failure and stage 1 through stage 4 chronic kidney disease, or unspecified chronic kidney disease: Secondary | ICD-10-CM | POA: Diagnosis present

## 2018-04-05 DIAGNOSIS — Z841 Family history of disorders of kidney and ureter: Secondary | ICD-10-CM

## 2018-04-05 DIAGNOSIS — Z880 Allergy status to penicillin: Secondary | ICD-10-CM

## 2018-04-05 DIAGNOSIS — E669 Obesity, unspecified: Secondary | ICD-10-CM | POA: Diagnosis present

## 2018-04-05 DIAGNOSIS — E872 Acidosis, unspecified: Secondary | ICD-10-CM | POA: Diagnosis present

## 2018-04-05 DIAGNOSIS — I739 Peripheral vascular disease, unspecified: Secondary | ICD-10-CM | POA: Diagnosis present

## 2018-04-05 DIAGNOSIS — Z79890 Hormone replacement therapy (postmenopausal): Secondary | ICD-10-CM

## 2018-04-05 DIAGNOSIS — Y92230 Patient room in hospital as the place of occurrence of the external cause: Secondary | ICD-10-CM | POA: Diagnosis not present

## 2018-04-05 DIAGNOSIS — Z96651 Presence of right artificial knee joint: Secondary | ICD-10-CM | POA: Diagnosis present

## 2018-04-05 DIAGNOSIS — Z91041 Radiographic dye allergy status: Secondary | ICD-10-CM

## 2018-04-05 DIAGNOSIS — T50915A Adverse effect of multiple unspecified drugs, medicaments and biological substances, initial encounter: Secondary | ICD-10-CM | POA: Diagnosis not present

## 2018-04-05 DIAGNOSIS — Z8249 Family history of ischemic heart disease and other diseases of the circulatory system: Secondary | ICD-10-CM

## 2018-04-05 LAB — COMPREHENSIVE METABOLIC PANEL
ALT: 26 U/L (ref 0–44)
AST: 41 U/L (ref 15–41)
Albumin: 3.2 g/dL — ABNORMAL LOW (ref 3.5–5.0)
Alkaline Phosphatase: 138 U/L — ABNORMAL HIGH (ref 38–126)
Anion gap: 17 — ABNORMAL HIGH (ref 5–15)
BUN: 45 mg/dL — AB (ref 8–23)
CO2: 18 mmol/L — ABNORMAL LOW (ref 22–32)
CREATININE: 3.45 mg/dL — AB (ref 0.44–1.00)
Calcium: 9.5 mg/dL (ref 8.9–10.3)
Chloride: 100 mmol/L (ref 98–111)
GFR calc Af Amer: 14 mL/min — ABNORMAL LOW (ref >60)
GFR, EST NON AFRICAN AMERICAN: 12 mL/min — AB (ref >60)
Glucose, Bld: 101 mg/dL — ABNORMAL HIGH (ref 70–99)
POTASSIUM: 3.4 mmol/L — AB (ref 3.5–5.1)
Sodium: 135 mmol/L (ref 135–145)
Total Bilirubin: 1.3 mg/dL — ABNORMAL HIGH (ref 0.3–1.2)
Total Protein: 8.1 g/dL (ref 6.5–8.1)

## 2018-04-05 LAB — CBC
HCT: 36.1 % (ref 36.0–46.0)
Hemoglobin: 12.1 g/dL (ref 12.0–15.0)
MCH: 28.2 pg (ref 26.0–34.0)
MCHC: 33.5 g/dL (ref 30.0–36.0)
MCV: 84.1 fL (ref 78.0–100.0)
PLATELETS: 336 10*3/uL (ref 150–400)
RBC: 4.29 MIL/uL (ref 3.87–5.11)
RDW: 14 % (ref 11.5–15.5)
WBC: 10.9 10*3/uL — AB (ref 4.0–10.5)

## 2018-04-05 LAB — LACTIC ACID, PLASMA: Lactic Acid, Venous: 1.1 mmol/L (ref 0.5–1.9)

## 2018-04-05 LAB — URINALYSIS, ROUTINE W REFLEX MICROSCOPIC
BILIRUBIN URINE: NEGATIVE
Glucose, UA: NEGATIVE mg/dL
KETONES UR: NEGATIVE mg/dL
NITRITE: NEGATIVE
PH: 5 (ref 5.0–8.0)
Protein, ur: NEGATIVE mg/dL
Specific Gravity, Urine: 1.011 (ref 1.005–1.030)

## 2018-04-05 LAB — LIPASE, BLOOD: Lipase: 29 U/L (ref 11–51)

## 2018-04-05 MED ORDER — METRONIDAZOLE IN NACL 5-0.79 MG/ML-% IV SOLN
500.0000 mg | Freq: Three times a day (TID) | INTRAVENOUS | Status: AC
  Administered 2018-04-06 – 2018-04-19 (×39): 500 mg via INTRAVENOUS
  Filled 2018-04-05 (×41): qty 100

## 2018-04-05 MED ORDER — ONDANSETRON HCL 4 MG PO TABS
4.0000 mg | ORAL_TABLET | Freq: Four times a day (QID) | ORAL | Status: DC | PRN
  Administered 2018-04-14: 4 mg via ORAL
  Filled 2018-04-05: qty 1

## 2018-04-05 MED ORDER — STERILE WATER FOR INJECTION IV SOLN: INTRAVENOUS | Status: DC

## 2018-04-05 MED ORDER — GABAPENTIN 100 MG PO CAPS
100.0000 mg | ORAL_CAPSULE | Freq: Every day | ORAL | Status: DC
  Administered 2018-04-05 – 2018-04-17 (×12): 100 mg via ORAL
  Filled 2018-04-05 (×13): qty 1

## 2018-04-05 MED ORDER — ALLOPURINOL 100 MG PO TABS
100.0000 mg | ORAL_TABLET | Freq: Every day | ORAL | Status: DC
  Administered 2018-04-06 – 2018-04-10 (×5): 100 mg via ORAL
  Filled 2018-04-05 (×6): qty 1

## 2018-04-05 MED ORDER — HYDROMORPHONE HCL 1 MG/ML IJ SOLN
0.5000 mg | Freq: Once | INTRAMUSCULAR | Status: AC
  Administered 2018-04-05: 0.5 mg via INTRAVENOUS
  Filled 2018-04-05: qty 1

## 2018-04-05 MED ORDER — ATORVASTATIN CALCIUM 80 MG PO TABS
80.0000 mg | ORAL_TABLET | Freq: Every day | ORAL | Status: DC
  Administered 2018-04-06 – 2018-04-10 (×4): 80 mg via ORAL
  Filled 2018-04-05 (×6): qty 1

## 2018-04-05 MED ORDER — SODIUM CHLORIDE 0.9 % IV SOLN
1.0000 g | INTRAVENOUS | Status: AC
  Administered 2018-04-06 – 2018-04-18 (×13): 1 g via INTRAVENOUS
  Filled 2018-04-05 (×15): qty 1

## 2018-04-05 MED ORDER — LEVOTHYROXINE SODIUM 50 MCG PO TABS
50.0000 ug | ORAL_TABLET | Freq: Every day | ORAL | Status: DC
  Administered 2018-04-06 – 2018-04-11 (×6): 50 ug via ORAL
  Filled 2018-04-05 (×7): qty 1

## 2018-04-05 MED ORDER — PANTOPRAZOLE SODIUM 40 MG PO TBEC
40.0000 mg | DELAYED_RELEASE_TABLET | Freq: Two times a day (BID) | ORAL | Status: DC
  Administered 2018-04-05 – 2018-04-10 (×11): 40 mg via ORAL
  Filled 2018-04-05 (×13): qty 1

## 2018-04-05 MED ORDER — ACETAMINOPHEN 325 MG PO TABS
650.0000 mg | ORAL_TABLET | Freq: Four times a day (QID) | ORAL | Status: DC | PRN
  Administered 2018-04-06 – 2018-04-10 (×7): 650 mg via ORAL
  Filled 2018-04-05 (×8): qty 2

## 2018-04-05 MED ORDER — POTASSIUM CHLORIDE CRYS ER 20 MEQ PO TBCR
20.0000 meq | EXTENDED_RELEASE_TABLET | Freq: Once | ORAL | Status: AC
  Administered 2018-04-05: 20 meq via ORAL
  Filled 2018-04-05: qty 1

## 2018-04-05 MED ORDER — SODIUM CHLORIDE 0.9 % IV BOLUS
1000.0000 mL | Freq: Once | INTRAVENOUS | Status: AC
  Administered 2018-04-05: 1000 mL via INTRAVENOUS

## 2018-04-05 MED ORDER — SODIUM CHLORIDE 0.9 % IV SOLN
2.0000 g | Freq: Once | INTRAVENOUS | Status: AC
  Administered 2018-04-05: 2 g via INTRAVENOUS
  Filled 2018-04-05: qty 2

## 2018-04-05 MED ORDER — ONDANSETRON HCL 4 MG/2ML IJ SOLN
4.0000 mg | Freq: Four times a day (QID) | INTRAMUSCULAR | Status: DC | PRN
  Administered 2018-04-07 – 2018-04-21 (×11): 4 mg via INTRAVENOUS
  Filled 2018-04-05 (×11): qty 2

## 2018-04-05 MED ORDER — SODIUM CHLORIDE 0.9 % IV SOLN
INTRAVENOUS | Status: DC
  Administered 2018-04-05 – 2018-04-06 (×2): via INTRAVENOUS

## 2018-04-05 MED ORDER — HYDROMORPHONE HCL 1 MG/ML IJ SOLN: 0.5000 mg | INTRAMUSCULAR | Status: DC | PRN

## 2018-04-05 MED ORDER — OXYBUTYNIN CHLORIDE 5 MG PO TABS
5.0000 mg | ORAL_TABLET | Freq: Two times a day (BID) | ORAL | Status: DC
  Administered 2018-04-05 – 2018-04-24 (×36): 5 mg via ORAL
  Filled 2018-04-05 (×37): qty 1

## 2018-04-05 MED ORDER — ONDANSETRON HCL 4 MG/2ML IJ SOLN
4.0000 mg | Freq: Once | INTRAMUSCULAR | Status: DC
  Filled 2018-04-05: qty 2

## 2018-04-05 MED ORDER — HYDROMORPHONE HCL 1 MG/ML IJ SOLN
0.5000 mg | INTRAMUSCULAR | Status: DC | PRN
  Administered 2018-04-05 – 2018-04-06 (×2): 0.5 mg via INTRAVENOUS
  Administered 2018-04-06 – 2018-04-07 (×3): 1 mg via INTRAVENOUS
  Administered 2018-04-07: 0.5 mg via INTRAVENOUS
  Administered 2018-04-07 – 2018-04-08 (×2): 1 mg via INTRAVENOUS
  Administered 2018-04-09: 0.5 mg via INTRAVENOUS
  Administered 2018-04-09 (×2): 1 mg via INTRAVENOUS
  Administered 2018-04-10: 0.5 mg via INTRAVENOUS
  Administered 2018-04-10 – 2018-04-12 (×7): 1 mg via INTRAVENOUS
  Administered 2018-04-12 (×2): 0.5 mg via INTRAVENOUS
  Administered 2018-04-12: 1 mg via INTRAVENOUS
  Administered 2018-04-12 – 2018-04-17 (×8): 0.5 mg via INTRAVENOUS
  Administered 2018-04-17: 1 mg via INTRAVENOUS
  Administered 2018-04-17: 0.5 mg via INTRAVENOUS
  Administered 2018-04-18 – 2018-04-20 (×10): 1 mg via INTRAVENOUS
  Filled 2018-04-05 (×6): qty 1
  Filled 2018-04-05: qty 0.5
  Filled 2018-04-05 (×3): qty 1
  Filled 2018-04-05 (×2): qty 0.5
  Filled 2018-04-05 (×14): qty 1
  Filled 2018-04-05: qty 0.5
  Filled 2018-04-05 (×5): qty 1
  Filled 2018-04-05: qty 0.5
  Filled 2018-04-05 (×4): qty 1
  Filled 2018-04-05: qty 0.5
  Filled 2018-04-05 (×4): qty 1
  Filled 2018-04-05: qty 0.5

## 2018-04-05 MED ORDER — ACETAMINOPHEN 650 MG RE SUPP: 650.0000 mg | Freq: Four times a day (QID) | RECTAL | Status: DC | PRN

## 2018-04-05 MED ORDER — METRONIDAZOLE IN NACL 5-0.79 MG/ML-% IV SOLN
500.0000 mg | Freq: Once | INTRAVENOUS | Status: AC
  Administered 2018-04-05: 500 mg via INTRAVENOUS
  Filled 2018-04-05: qty 100

## 2018-04-05 NOTE — ED Triage Notes (Signed)
Pt presents for evaluation of RLQ pain with radiation to center of abd and flank since last Friday. Pt reports was supposed to see PCP today but went to wrong office.

## 2018-04-05 NOTE — Consult Note (Addendum)
Reason for Consult: Right lower quadrant abscess Referring Physician: Aileen Pilot  Joan Elliott is an 77 y.o. female.  HPI: Joan Elliott presented to the emergency department complaining of one-week history of right lower quadrant abdominal pain associated with anorexia.  She has not been eating much.  She reports just drinking enough fluids to take her medications.  She is passing gas but has not had a bowel movement in a couple of days.  She came to the emergency department for evaluation.  She was found to have mild leukocytosis of 10,900 and acute kidney injury with creatinine of 3.4.  CT scan of the abdomen and pelvis demonstrates right lower quadrant intra-abdominal abscess likely due to perforated appendicitis.  Mass cannot be ruled out.  I was asked to see her regarding surgical management.  She could not remember if she has ever had a colonoscopy.  Past Medical History:  Diagnosis Date  . Aortic atherosclerosis ([place]) 02/01/2018  . Arthritis   . COPD (chronic obstructive pulmonary disease) ([place])   . Coronary artery disease    s/p stent mid RCA 2003  . GERD (gastroesophageal reflux disease)   . Gout   . HLD (hyperlipidemia)   . Hypertension   . Hypertensive heart disease without CHF   . Hypothyroidism   . Morbid obesity ([place]) 02/01/2018  . Peripheral vascular disease ([place]) 02/01/2018  . Stage 4 chronic kidney disease ([place]) 02/01/2018  . Stroke ([place])   . Tobacco abuse     Past Surgical History:  Procedure Laterality Date  . ABDOMINAL HYSTERECTOMY    . REPLACEMENT TOTAL KNEE Right   . THYROID SURGERY      Family History  Problem Relation Age of Onset  . Hypertension Mother   . Kidney disease Mother   . Hypertension Brother   . Hypertension Sister     Social History:  reports that she has been smoking cigarettes. She has been smoking about 0.50 packs per day. She has never used smokeless tobacco. She reports that she does not drink alcohol or use drugs.  Allergies:  Allergies   Allergen Reactions  . Ivp Dye [Iodinated Diagnostic Agents] Nausea And Vomiting  . Oxycodone Itching  . Penicillins Rash    Has patient had a PCN reaction causing immediate rash, facial/tongue/throat swelling, SOB or lightheadedness with hypotension: Yes Has patient had a PCN reaction causing severe rash involving mucus membranes or skin necrosis: No Has patient had a PCN reaction that required hospitalization No Has patient had a PCN reaction occurring within the last 10 years: No If all of the above answers are "NO", then may proceed with Cephalosporin use.     Medications: I have reviewed the patient's current medications.  Results for orders placed or performed during the hospital encounter of 04/05/18 (from the past 48 hour(s))  Lipase, blood     Status: None   Collection Time: 04/05/18  1:01 PM  Result Value Ref Range   Lipase 29 11 - 51 U/L    Comment: Performed at [facility], [place] [address] [place], [place] [address]  Comprehensive metabolic panel     Status: Abnormal   Collection Time: 04/05/18  1:01 PM  Result Value Ref Range   Sodium 135 135 - 145 mmol/L   Potassium 3.4 (L) 3.5 - 5.1 mmol/L   Chloride 100 98 - 111 mmol/L   CO2 18 (L) 22 - 32 mmol/L   Glucose, Bld 101 (H) 70 - 99 mg/dL   BUN  45 (H) 8 - 23 mg/dL   Creatinine, Ser 3.45 (H) 0.44 - 1.00 mg/dL   Calcium 9.5 8.9 - 10.3 mg/dL   Total Protein 8.1 6.5 - 8.1 g/dL   Albumin 3.2 (L) 3.5 - 5.0 g/dL   AST 41 15 - 41 U/L   ALT 26 0 - 44 U/L   Alkaline Phosphatase 138 (H) 38 - 126 U/L   Total Bilirubin 1.3 (H) 0.3 - 1.2 mg/dL   GFR calc non Af Amer 12 (L) >60 mL/min   GFR calc Af Amer 14 (L) >60 mL/min    Comment: (NOTE) The eGFR has been calculated using the CKD EPI equation. This calculation has not been validated in all clinical situations. eGFR's persistently <60 mL/min signify possible Chronic Kidney Disease.    Anion gap 17 (H) 5 - 15    Comment: Performed at [facility], [place]  [address] [place], [place] [address]  CBC     Status: Abnormal   Collection Time: 04/05/18  1:01 PM  Result Value Ref Range   WBC 10.9 (H) 4.0 - 10.5 K/uL   RBC 4.29 3.87 - 5.11 MIL/uL   Hemoglobin 12.1 12.0 - 15.0 g/dL   HCT 36.1 36.0 - 46.0 %   MCV 84.1 78.0 - 100.0 fL   MCH 28.2 26.0 - 34.0 pg   MCHC 33.5 30.0 - 36.0 g/dL   RDW 14.0 11.5 - 15.5 %   Platelets 336 150 - 400 K/uL    Comment: Performed at [facility] [address] [place], [place] [address]  Urinalysis, Routine w reflex microscopic     Status: Abnormal   Collection Time: 04/05/18  6:12 PM  Result Value Ref Range   Color, Urine YELLOW YELLOW   APPearance HAZY (A) CLEAR   Specific Gravity, Urine 1.011 1.005 - 1.030   pH 5.0 5.0 - 8.0   Glucose, UA NEGATIVE NEGATIVE mg/dL   Hgb urine dipstick SMALL (A) NEGATIVE   Bilirubin Urine NEGATIVE NEGATIVE   Ketones, ur NEGATIVE NEGATIVE mg/dL   Protein, ur NEGATIVE NEGATIVE mg/dL   Nitrite NEGATIVE NEGATIVE   Leukocytes, UA LARGE (A) NEGATIVE   RBC / HPF 0-5 0 - 5 RBC/hpf   WBC, UA 11-20 0 - 5 WBC/hpf   Bacteria, UA FEW (A) NONE SEEN   Squamous Epithelial / LPF 0-5 0 - 5   Mucus PRESENT    Hyaline Casts, UA PRESENT    Non Squamous Epithelial 0-5 (A) NONE SEEN    Comment: Performed at [facility], [place] [address] [place], [place] [address]    Ct Abdomen Pelvis Wo Contrast  Result Date: 04/05/2018 CLINICAL DATA:  Right lower quadrant pain and flank pain. EXAM: CT ABDOMEN AND PELVIS WITHOUT CONTRAST TECHNIQUE: Multidetector CT imaging of the abdomen and pelvis was performed following the standard protocol without IV contrast. COMPARISON:  None. FINDINGS: Lower chest: Calcific atherosclerotic disease of the coronary arteries. Calcified mediastinal and hilar lymph nodes, likely due to prior granulomatous disease. Hepatobiliary: No focal liver abnormality is seen. Status post cholecystectomy. No biliary dilatation. Pancreas: Unremarkable. No pancreatic ductal  dilatation or surrounding inflammatory changes. Spleen: Normal in size without focal abnormality. Adrenals/Urinary Tract: Bilateral diffuse adrenal thickening. No evidence of nephrolithiasis or hydronephrosis. Stomach/Bowel: Normal appearance of the stomach and small bowel. 6.0 x 5.1 x 6.0 cm ill marginated soft tissue mass versus an abscess emanates from the cecum, just inferior to the ileocecal valve. There is adjacent mesenteric stranding.  Surrounding small round lymph nodes also seen. The appendix is not clearly identified. Vascular/Lymphatic: Aortic atherosclerosis. No enlarged abdominal or pelvic lymph nodes. Reproductive: Status post hysterectomy. No adnexal masses. Other: Small fat containing anterior periumbilical abdominal wall hernia. Musculoskeletal: Spondylosis of the lumbosacral spine mild anterolisthesis of L4 on L5, likely degenerative. IMPRESSION: 6 cm ill marginated soft tissue mass versus an abscess emanating from the cecum, just inferior to the ileocecal valve. Adjacent mesenteric stranding and sub pathologic by size criteria but rounded mesenteric lymph nodes. No evidence of nephrolithiasis or hydronephrosis. Bilateral adrenal gland thickening, likely due to adrenal hyperplasia. These results were called by telephone at the time of interpretation on 04/05/2018 at 6:41 pm to Dr. Gilford Raid , who verbally acknowledged these results. Electronically Signed   By: Fidela Salisbury M.D.   On: 04/05/2018 18:45    Review of Systems  Constitutional: Positive for chills and malaise/fatigue.  HENT: Negative for hearing loss.   Eyes: Negative for blurred vision.  Respiratory: Positive for sputum production.   Cardiovascular: Negative for chest pain.  Gastrointestinal: Positive for abdominal pain and nausea. Negative for blood in stool, diarrhea and vomiting.  Genitourinary:       Dark urine  Musculoskeletal: Negative.   Neurological: Negative.   Endo/Heme/Allergies: Negative.    Psychiatric/Behavioral: Negative.    Blood pressure (!) 101/52, pulse 90, temperature 98.7 F (37.1 C), temperature source Oral, resp. rate 18, SpO2 98 %. Physical Exam  Constitutional: She is oriented to person, place, and time. She appears well-developed and well-nourished. No distress.  HENT:  Head: Normocephalic.  Right Ear: External ear normal.  Left Ear: External ear normal.  Oral mucosa dry  Eyes: Pupils are equal, round, and reactive to light.  Neck: Neck supple. No tracheal deviation present. No thyromegaly present.  Cardiovascular: Normal rate and normal heart sounds.  Respiratory: Effort normal. No respiratory distress. She has wheezes. She has no rales. She exhibits no tenderness.  Minimal wheezing  GI: Soft. She exhibits no distension. There is tenderness. There is guarding. There is no rebound.  Tenderness right lower quadrant with voluntary guarding, no peritonitis, no generalized tenderness  Musculoskeletal: She exhibits edema.  Neurological: She is alert and oriented to person, place, and time.  Skin: Skin is warm.  Psychiatric: She has a normal mood and affect.    Assessment/Plan: Right lower quadrant intra-abdominal abscess likely consistent with perforated appendicitis.  Cannot rule out mass.  Agree with medical admission with IV antibiotics for broad-spectrum coverage.  Recommend interventional radiology for percutaneous drainage.  We will follow along.  Acute kidney injury and other medical problems -Per primary service  Zenovia Jarred 04/05/2018, 8:21 PM

## 2018-04-05 NOTE — ED Notes (Signed)
Attempted report x1. Left call back with secretary.

## 2018-04-05 NOTE — ED Provider Notes (Signed)
[facility] EMERGENCY DEPARTMENT Provider Note   CSN: [id number] Arrival date & time: 04/05/18  1204     History   Chief Complaint Chief Complaint  Patient presents with  . Abdominal Pain    HPI Joan Elliott Joan Elliott is a 77 y.o. female.  HPI  77 year old female with PMH for COPD, CAD status post stents, CHF last known EF 60-65%, HLD, HTN, hypothyroidism, PVD, CKD stage IV, prior CVA who presents with chief complaint of right lower quadrant abdominal pain.  Patient is a for the last week she is had progressive worsening right lower quadrant pain, aching, 8/10 in severity, constant, radiating to mid abdomen nothing makes it better, nothing makes it worse, associated with anorexia, nausea without vomiting.  Patient unable to state last bowel movement, greater than 7 days ago.  Patient still passing gas.  Patient denies fevers endorses chills.  Denies other infectious symptoms.  Patient has never had this pain before.  Denies urinary symptoms.  Patient endorses change in color of urine to a gold color over the last week.  Past Medical History:  Diagnosis Date  . Aortic atherosclerosis ([place]) 02/01/2018  . Arthritis   . COPD (chronic obstructive pulmonary disease) ([place])   . Coronary artery disease    s/p stent mid RCA 2003  . GERD (gastroesophageal reflux disease)   . Gout   . HLD (hyperlipidemia)   . Hypertension   . Hypertensive heart disease without CHF   . Hypothyroidism   . Morbid obesity ([place]) 02/01/2018  . Peripheral vascular disease ([place]) 02/01/2018  . Stage 4 chronic kidney disease ([place]) 02/01/2018  . Stroke ([place])   . Tobacco abuse     Patient Active Problem List   Diagnosis Date Noted  . AKI (acute kidney injury) ([place]) 04/05/2018  . Acute perforated appendicitis 04/05/2018  . HTN (hypertension) 04/05/2018  . Aortic atherosclerosis ([place]) 02/01/2018  . Morbid obesity ([place]) 02/01/2018  . CKD (chronic kidney disease) stage 3, GFR 30-59 ml/min (HCC) 02/01/2018  .  Peripheral vascular disease ([place]) 02/01/2018  . Nonspecific chest pain 02/23/2015  . Hypertensive heart disease without CHF   . Coronary artery disease   . Tobacco abuse   . Hypothyroidism   . HLD (hyperlipidemia)   . Gout   . GERD (gastroesophageal reflux disease)     Past Surgical History:  Procedure Laterality Date  . ABDOMINAL HYSTERECTOMY    . REPLACEMENT TOTAL KNEE Right   . THYROID SURGERY       OB History   None      Home Medications    Prior to Admission medications   Medication Sig Start Date End Date Taking? Authorizing Provider  allopurinol (ZYLOPRIM) 100 MG tablet Take 1 tablet (100 mg total) by mouth daily. 02/02/18  Yes Nita Sells, MD  atorvastatin (LIPITOR) 80 MG tablet Take 1 tablet (80 mg total) by mouth daily at 6 PM. 03/19/18  Yes Hochrein, Jeneen Rinks, MD  clopidogrel (PLAVIX) 75 MG tablet Take 1 tablet (75 mg total) by mouth daily. 02/02/18  Yes Nita Sells, MD  gabapentin (NEURONTIN) 100 MG capsule TAKE 1 CAPSULE(100 MG) BY MOUTH AT BEDTIME Patient taking differently: Take 100 mg by mouth at bedtime.  02/02/18   Nita Sells, MD  levothyroxine (SYNTHROID, LEVOTHROID) 50 MCG tablet Take 1 tablet (50 mcg total) by mouth daily. 02/02/18   Nita Sells, MD  metoprolol tartrate (LOPRESSOR) 25 MG tablet Take 0.5 tablets (12.5 mg total) by mouth 2 (two) times daily. 02/02/18  Nita Sells, MD  nicotine (NICODERM CQ - DOSED IN MG/24 HR) 7 mg/24hr patch Place 7 mg onto the skin daily as needed.     [provider]  oxybutynin (DITROPAN) 5 MG tablet Take 1 tablet (5 mg total) by mouth 2 (two) times daily. 02/02/18   Nita Sells, MD  pantoprazole (PROTONIX) 40 MG tablet Take 1 tablet (40 mg total) by mouth 2 (two) times daily. 02/02/18   Nita Sells, MD    Family History Family History  Problem Relation Age of Onset  . Hypertension Mother   . Kidney disease Mother   . Hypertension Brother   . Hypertension  Sister     Social History Social History   Tobacco Use  . Smoking status: Current Some Day Smoker    Packs/day: 0.50    Types: Cigarettes  . Smokeless tobacco: Never Used  Substance Use Topics  . Alcohol use: No  . Drug use: No     Allergies   Ivp dye [iodinated diagnostic agents]; Oxycodone; and Penicillins   Review of Systems Review of Systems  Constitutional: Positive for appetite change, chills and fatigue. Negative for diaphoresis and fever.  HENT: Negative for congestion, ear pain, sinus pain and sore throat.   Eyes: Negative for pain and visual disturbance.  Respiratory: Negative for cough, chest tightness and shortness of breath.   Cardiovascular: Negative for chest pain and palpitations.  Gastrointestinal: Positive for abdominal distention, abdominal pain, constipation and nausea. Negative for diarrhea and vomiting.  Genitourinary: Negative for dysuria and hematuria.  Musculoskeletal: Negative for arthralgias and back pain.  Skin: Negative for color change and rash.  Neurological: Negative for seizures and syncope.  All other systems reviewed and are negative.    Physical Exam Updated Vital Signs BP 138/75   Pulse 93   Temp 98.7 F (37.1 C) (Oral)   Resp 18   SpO2 100%   Physical Exam  Constitutional: She appears well-developed and well-nourished. No distress.  HENT:  Head: Normocephalic and atraumatic.  Eyes: Conjunctivae are normal.  Neck: Neck supple.  Cardiovascular: Normal rate and regular rhythm.  No murmur heard. Pulmonary/Chest: Effort normal and breath sounds normal. No respiratory distress.  Abdominal: Soft. There is generalized tenderness and tenderness in the right lower quadrant and suprapubic area. There is no rigidity, no rebound, no guarding, no tenderness at McBurney's point and negative Murphy's sign.  Musculoskeletal: She exhibits no edema.  Neurological: She is alert.  Skin: Skin is warm and dry.  Psychiatric: She has a normal  mood and affect.  Nursing note and vitals reviewed.    ED Treatments / Results  Labs (all labs ordered are listed, but only abnormal results are displayed) Labs Reviewed  COMPREHENSIVE METABOLIC PANEL - Abnormal; Notable for the following components:      Result Value   Potassium 3.4 (*)    CO2 18 (*)    Glucose, Bld 101 (*)    BUN 45 (*)    Creatinine, Ser 3.45 (*)    Albumin 3.2 (*)    Alkaline Phosphatase 138 (*)    Total Bilirubin 1.3 (*)    GFR calc non Af Amer 12 (*)    GFR calc Af Amer 14 (*)    Anion gap 17 (*)    All other components within normal limits  CBC - Abnormal; Notable for the following components:   WBC 10.9 (*)    All other components within normal limits  URINALYSIS, ROUTINE W REFLEX MICROSCOPIC - Abnormal;  Notable for the following components:   APPearance HAZY (*)    Hgb urine dipstick SMALL (*)    Leukocytes, UA LARGE (*)    Bacteria, UA FEW (*)    Non Squamous Epithelial 0-5 (*)    All other components within normal limits  URINE CULTURE  LIPASE, BLOOD  BASIC METABOLIC PANEL  CBC  LACTIC ACID, PLASMA  LACTIC ACID, PLASMA    EKG None  Radiology Ct Abdomen Pelvis Wo Contrast  Result Date: 04/05/2018 CLINICAL DATA:  Right lower quadrant pain and flank pain. EXAM: CT ABDOMEN AND PELVIS WITHOUT CONTRAST TECHNIQUE: Multidetector CT imaging of the abdomen and pelvis was performed following the standard protocol without IV contrast. COMPARISON:  None. FINDINGS: Lower chest: Calcific atherosclerotic disease of the coronary arteries. Calcified mediastinal and hilar lymph nodes, likely due to prior granulomatous disease. Hepatobiliary: No focal liver abnormality is seen. Status post cholecystectomy. No biliary dilatation. Pancreas: Unremarkable. No pancreatic ductal dilatation or surrounding inflammatory changes. Spleen: Normal in size without focal abnormality. Adrenals/Urinary Tract: Bilateral diffuse adrenal thickening. No evidence of nephrolithiasis  or hydronephrosis. Stomach/Bowel: Normal appearance of the stomach and small bowel. 6.0 x 5.1 x 6.0 cm ill marginated soft tissue mass versus an abscess emanates from the cecum, just inferior to the ileocecal valve. There is adjacent mesenteric stranding. Surrounding small round lymph nodes also seen. The appendix is not clearly identified. Vascular/Lymphatic: Aortic atherosclerosis. No enlarged abdominal or pelvic lymph nodes. Reproductive: Status post hysterectomy. No adnexal masses. Other: Small fat containing anterior periumbilical abdominal wall hernia. Musculoskeletal: Spondylosis of the lumbosacral spine mild anterolisthesis of L4 on L5, likely degenerative. IMPRESSION: 6 cm ill marginated soft tissue mass versus an abscess emanating from the cecum, just inferior to the ileocecal valve. Adjacent mesenteric stranding and sub pathologic by size criteria but rounded mesenteric lymph nodes. No evidence of nephrolithiasis or hydronephrosis. Bilateral adrenal gland thickening, likely due to adrenal hyperplasia. These results were called by telephone at the time of interpretation on 04/05/2018 at 6:41 pm to Dr. Gilford Raid , who verbally acknowledged these results. Electronically Signed   By: Fidela Salisbury M.D.   On: 04/05/2018 18:45    Procedures Procedures (including critical care time)  Medications Ordered in ED Medications  ondansetron (ZOFRAN) injection 4 mg (4 mg Intravenous Refused 04/05/18 2227)  sodium chloride 0.9 % bolus 1,000 mL (1,000 mLs Intravenous New Bag/Given 04/05/18 2229)  0.9 %  sodium chloride infusion (has no administration in time range)  levothyroxine (SYNTHROID, LEVOTHROID) tablet 50 mcg (has no administration in time range)  oxybutynin (DITROPAN) tablet 5 mg (has no administration in time range)  pantoprazole (PROTONIX) EC tablet 40 mg (has no administration in time range)  atorvastatin (LIPITOR) tablet 80 mg (has no administration in time range)  allopurinol (ZYLOPRIM)  tablet 100 mg (has no administration in time range)  gabapentin (NEURONTIN) capsule 100 mg (has no administration in time range)  metroNIDAZOLE (FLAGYL) IVPB 500 mg (has no administration in time range)  potassium chloride SA (K-DUR,KLOR-CON) CR tablet 20 mEq (has no administration in time range)  ceFEPIme (MAXIPIME) 1 g in sodium chloride 0.9 % 100 mL IVPB (has no administration in time range)  acetaminophen (TYLENOL) tablet 650 mg (has no administration in time range)    Or  acetaminophen (TYLENOL) suppository 650 mg (has no administration in time range)  ondansetron (ZOFRAN) tablet 4 mg (has no administration in time range)    Or  ondansetron (ZOFRAN) injection 4 mg (has no administration in time range)  HYDROmorphone (DILAUDID) injection 0.5-1 mg (has no administration in time range)  HYDROmorphone (DILAUDID) injection 0.5 mg (0.5 mg Intravenous Given 04/05/18 2051)  ceFEPIme (MAXIPIME) 2 g in sodium chloride 0.9 % 100 mL IVPB (2 g Intravenous New Bag/Given 04/05/18 2236)    And  metroNIDAZOLE (FLAGYL) IVPB 500 mg (0 mg Intravenous Stopped 04/05/18 2236)     Initial Impression / Assessment and Plan / ED Course  I have reviewed the triage vital signs and the nursing notes.  Pertinent labs & imaging results that were available during my care of the patient were reviewed by me and considered in my medical decision making (see chart for details).    77 year old female with PMH for COPD, CAD status post stents, CHF last known EF 60-65%, HLD, HTN, hypothyroidism, PVD, CKD stage IV, prior CVA who presents with chief complaint of right lower quadrant abdominal pain.  DDX includes appendicitis, perforated appendicitis, bowel obstruction with incarceration, pyelonephritis, kidney stone or colitis.  Patient afebrile, heart rate 94, normotensive.  Patient given IVF, morphine and Zofran.  Labs imaging performed which revealed Creatinine 3.45, last known 1.49 2 months ago.  AG 17, CO2 18, BG 101.   K 3.4.  CT abdomen reveals right lower quadrant abscess, appendix not visualized.  Possible mass of oncologic origin.  History and physical most consistent with ruptured appendicitis with right lower quadrant abscess.  Patient started on IV antibiotics and IV fluids.  General surgery consulted, advised patient hospitalization with admission to hospitalist service for IV antibiotics and surgery to follow.  Patient will likely require percutaneous drain for abscess with delayed surgical procedure.  Patient admitted to hospitalist for further management.  Patient is in contact with my attending Dr. Gilford Raid who agrees with plan and disposition.  Final Clinical Impressions(s) / ED Diagnoses   Final diagnoses:  Perforated appendicitis    ED Discharge Orders    None       Keenan Bachelor, MD 04/05/18 2324    Isla Pence, MD 04/05/18 [phone]

## 2018-04-05 NOTE — H&P (Addendum)
History and Physical    Joan Elliott:[id number] DOB: October 04, 1940 DOA: 04/05/2018  PCP: [name], MD  Patient coming from: Home  I have personally briefly reviewed patient's old medical records in [place]  Chief Complaint: Abd pain  HPI: Joan Elliott is a 77 y.o. female with medical history significant of CKD stage 3, HTN, stroke, CAD.  Patient presents to the ED with ~7 day history of worsening RLQ abd pain.  Associated anorexia and poor PO intake.  Passing gas but no BM in past couple of days.  Symptoms worsening, nothing makes it better nor worse.  Had nausea without vomiting.  Had chills but no fevers.  Urine color change over past week.   ED Course: WBC 10k, AKI creat 3.4 up from baseline 1.6.  Bicarb 18.  UA shows mild UTI.  CT abd/pelvis shows RLQ mass vs abscess originating from cecum.   Review of Systems: As per HPI otherwise 10 point review of systems negative.   Past Medical History:  Diagnosis Date  . Aortic atherosclerosis ([place]) 02/01/2018  . Arthritis   . COPD (chronic obstructive pulmonary disease) ([place])   . Coronary artery disease    s/p stent mid RCA 2003  . GERD (gastroesophageal reflux disease)   . Gout   . HLD (hyperlipidemia)   . Hypertension   . Hypertensive heart disease without CHF   . Hypothyroidism   . Morbid obesity ([place]) 02/01/2018  . Peripheral vascular disease ([place]) 02/01/2018  . Stage 4 chronic kidney disease ([place]) 02/01/2018  . Stroke ([place])   . Tobacco abuse     Past Surgical History:  Procedure Laterality Date  . ABDOMINAL HYSTERECTOMY    . REPLACEMENT TOTAL KNEE Right   . THYROID SURGERY       reports that she has been smoking cigarettes. She has been smoking about 0.50 packs per day. She has never used smokeless tobacco. She reports that she does not drink alcohol or use drugs.  Allergies  Allergen Reactions  . Ivp Dye [Iodinated Diagnostic Agents] Nausea And Vomiting  . Oxycodone Itching  . Penicillins Rash   Has patient had a PCN reaction causing immediate rash, facial/tongue/throat swelling, SOB or lightheadedness with hypotension: Yes Has patient had a PCN reaction causing severe rash involving mucus membranes or skin necrosis: No Has patient had a PCN reaction that required hospitalization No Has patient had a PCN reaction occurring within the last 10 years: No If all of the above answers are "NO", then may proceed with Cephalosporin use.     Family History  Problem Relation Age of Onset  . Hypertension Mother   . Kidney disease Mother   . Hypertension Brother   . Hypertension Sister      Prior to Admission medications   Medication Sig Start Date End Date Taking? Authorizing Provider  allopurinol (ZYLOPRIM) 100 MG tablet Take 1 tablet (100 mg total) by mouth daily. 02/02/18  Yes Nita Sells, MD  atorvastatin (LIPITOR) 80 MG tablet Take 1 tablet (80 mg total) by mouth daily at 6 PM. 03/19/18  Yes Hochrein, Jeneen Rinks, MD  clopidogrel (PLAVIX) 75 MG tablet Take 1 tablet (75 mg total) by mouth daily. 02/02/18  Yes Nita Sells, MD  gabapentin (NEURONTIN) 100 MG capsule TAKE 1 CAPSULE(100 MG) BY MOUTH AT BEDTIME Patient taking differently: Take 100 mg by mouth at bedtime.  02/02/18   Nita Sells, MD  levothyroxine (SYNTHROID, LEVOTHROID) 50 MCG tablet Take 1 tablet (50 mcg total) by mouth  daily. 02/02/18   Nita Sells, MD  metoprolol tartrate (LOPRESSOR) 25 MG tablet Take 0.5 tablets (12.5 mg total) by mouth 2 (two) times daily. 02/02/18   Nita Sells, MD  nicotine (NICODERM CQ - DOSED IN MG/24 HR) 7 mg/24hr patch Place 7 mg onto the skin daily as needed.     [provider]  oxybutynin (DITROPAN) 5 MG tablet Take 1 tablet (5 mg total) by mouth 2 (two) times daily. 02/02/18   Nita Sells, MD  pantoprazole (PROTONIX) 40 MG tablet Take 1 tablet (40 mg total) by mouth 2 (two) times daily. 02/02/18   Nita Sells, MD    Physical Exam: Vitals:     04/05/18 1228 04/05/18 1459 04/05/18 1926 04/05/18 2115  BP: 98/67 (!) 101/52 115/68 123/90  Pulse: 94 90 94 93  Resp: 20 18 16    Temp: 98.7 F (37.1 C)     TempSrc: Oral     SpO2: 99% 98% 100% 99%    Constitutional: NAD, calm, comfortable Eyes: PERRL, lids and conjunctivae normal ENMT: Mucous membranes are dry. Posterior pharynx clear of any exudate or lesions.Normal dentition.  Neck: normal, supple, no masses, no thyromegaly Respiratory: clear to auscultation bilaterally, no wheezing, no crackles. Normal respiratory effort. No accessory muscle use.  Cardiovascular: Regular rate and rhythm, no murmurs / rubs / gallops. No extremity edema. 2+ pedal pulses. No carotid bruits.  Abdomen: RLQ TTP, guarding, no rebound, no generalized tenderness Musculoskeletal: no clubbing / cyanosis. No joint deformity upper and lower extremities. Good ROM, no contractures. Normal muscle tone.  Skin: no rashes, lesions, ulcers. No induration Neurologic: CN 2-12 grossly intact. Sensation intact, DTR normal. Strength 5/5 in all 4.  Psychiatric: Normal judgment and insight. Alert and oriented x 3. Normal mood.    Labs on Admission: I have personally reviewed following labs and imaging studies  CBC: Recent Labs  Lab 04/05/18 1301  WBC 10.9*  HGB 12.1  HCT 36.1  MCV 84.1  PLT 195   Basic Metabolic Panel: Recent Labs  Lab 04/05/18 1301  NA 135  K 3.4*  CL 100  CO2 18*  GLUCOSE 101*  BUN 45*  CREATININE 3.45*  CALCIUM 9.5   GFR: Estimated Creatinine Clearance: 13.4 mL/min (A) (by C-G formula based on SCr of 3.45 mg/dL (H)). Liver Function Tests: Recent Labs  Lab 04/05/18 1301  AST 41  ALT 26  ALKPHOS 138*  BILITOT 1.3*  PROT 8.1  ALBUMIN 3.2*   Recent Labs  Lab 04/05/18 1301  LIPASE 29   No results for input(s): AMMONIA in the last 168 hours. Coagulation Profile: No results for input(s): INR, PROTIME in the last 168 hours. Cardiac Enzymes: No results for input(s):  CKTOTAL, CKMB, CKMBINDEX, TROPONINI in the last 168 hours. BNP (last 3 results) No results for input(s): PROBNP in the last 8760 hours. HbA1C: No results for input(s): HGBA1C in the last 72 hours. CBG: No results for input(s): GLUCAP in the last 168 hours. Lipid Profile: No results for input(s): CHOL, HDL, LDLCALC, TRIG, CHOLHDL, LDLDIRECT in the last 72 hours. Thyroid Function Tests: No results for input(s): TSH, T4TOTAL, FREET4, T3FREE, THYROIDAB in the last 72 hours. Anemia Panel: No results for input(s): VITAMINB12, FOLATE, FERRITIN, TIBC, IRON, RETICCTPCT in the last 72 hours. Urine analysis:    Component Value Date/Time   COLORURINE YELLOW 04/05/2018 1812   APPEARANCEUR HAZY (A) 04/05/2018 1812   LABSPEC 1.011 04/05/2018 1812   PHURINE 5.0 04/05/2018 1812   GLUCOSEU NEGATIVE 04/05/2018 1812  HGBUR SMALL (A) 04/05/2018 1812   BILIRUBINUR NEGATIVE 04/05/2018 1812   KETONESUR NEGATIVE 04/05/2018 1812   PROTEINUR NEGATIVE 04/05/2018 1812   NITRITE NEGATIVE 04/05/2018 1812   LEUKOCYTESUR LARGE (A) 04/05/2018 1812    Radiological Exams on Admission: Ct Abdomen Pelvis Wo Contrast  Result Date: 04/05/2018 CLINICAL DATA:  Right lower quadrant pain and flank pain. EXAM: CT ABDOMEN AND PELVIS WITHOUT CONTRAST TECHNIQUE: Multidetector CT imaging of the abdomen and pelvis was performed following the standard protocol without IV contrast. COMPARISON:  None. FINDINGS: Lower chest: Calcific atherosclerotic disease of the coronary arteries. Calcified mediastinal and hilar lymph nodes, likely due to prior granulomatous disease. Hepatobiliary: No focal liver abnormality is seen. Status post cholecystectomy. No biliary dilatation. Pancreas: Unremarkable. No pancreatic ductal dilatation or surrounding inflammatory changes. Spleen: Normal in size without focal abnormality. Adrenals/Urinary Tract: Bilateral diffuse adrenal thickening. No evidence of nephrolithiasis or hydronephrosis. Stomach/Bowel:  Normal appearance of the stomach and small bowel. 6.0 x 5.1 x 6.0 cm ill marginated soft tissue mass versus an abscess emanates from the cecum, just inferior to the ileocecal valve. There is adjacent mesenteric stranding. Surrounding small round lymph nodes also seen. The appendix is not clearly identified. Vascular/Lymphatic: Aortic atherosclerosis. No enlarged abdominal or pelvic lymph nodes. Reproductive: Status post hysterectomy. No adnexal masses. Other: Small fat containing anterior periumbilical abdominal wall hernia. Musculoskeletal: Spondylosis of the lumbosacral spine mild anterolisthesis of L4 on L5, likely degenerative. IMPRESSION: 6 cm ill marginated soft tissue mass versus an abscess emanating from the cecum, just inferior to the ileocecal valve. Adjacent mesenteric stranding and sub pathologic by size criteria but rounded mesenteric lymph nodes. No evidence of nephrolithiasis or hydronephrosis. Bilateral adrenal gland thickening, likely due to adrenal hyperplasia. These results were called by telephone at the time of interpretation on 04/05/2018 at 6:41 pm to Dr. Gilford Raid , who verbally acknowledged these results. Electronically Signed   By: Fidela Salisbury M.D.   On: 04/05/2018 18:45    EKG: Independently reviewed.  Assessment/Plan Principal Problem:   Acute perforated appendicitis Active Problems:   CKD (chronic kidney disease) stage 3, GFR 30-59 ml/min (HCC)   AKI (acute kidney injury) ([place])   HTN (hypertension)    1. Perforated appendicitis - 1. DDx includes neoplasm originating from cecum, abscess not related to appendicitis, other. 2. Cefepime / flagyl 3. IVF: 1L bolus in ED then 125 cc/hr 4. Repeat CBC/BMP in AM 5. NPO 6. Dilaudid PRN pain 7. Surgery consult: see Dr Biagio Borg note in chart 8. IR drainage tomorrow 2. AKI on CKD stage 3 - 1. Suspect pre-renal due to poor PO intake 2. IVF as above 3. Strict intake and output 4. Repeat BMP in AM 3. HTN - Holding  Lopressor, BPs on the low side in ED  DVT prophylaxis: SCDs - planned IR drainage in AM Code Status: Full - patient states she wishes to be a full code at this time Family Communication: Family at bedside Disposition Plan: Home after admit Consults called: General Surgery Admission status: Admit to inpatient - IP status for treatment of intra-abdominal abscess, likely perforated appendicitis.   Etta Quill DO [facility] Pager [phone] Only works nights!  If 7AM-7PM, please contact the primary day team physician taking care of patient  [email or web address] Password [email or web address]  04/05/2018, 9:40 PM

## 2018-04-06 LAB — BASIC METABOLIC PANEL
Anion gap: 13 (ref 5–15)
BUN: 49 mg/dL — AB (ref 8–23)
CHLORIDE: 103 mmol/L (ref 98–111)
CO2: 18 mmol/L — AB (ref 22–32)
Calcium: 8.5 mg/dL — ABNORMAL LOW (ref 8.9–10.3)
Creatinine, Ser: 2.97 mg/dL — ABNORMAL HIGH (ref 0.44–1.00)
GFR calc Af Amer: 16 mL/min — ABNORMAL LOW (ref >60)
GFR calc non Af Amer: 14 mL/min — ABNORMAL LOW (ref >60)
GLUCOSE: 88 mg/dL (ref 70–99)
POTASSIUM: 3.4 mmol/L — AB (ref 3.5–5.1)
Sodium: 134 mmol/L — ABNORMAL LOW (ref 135–145)

## 2018-04-06 LAB — CBC
HEMATOCRIT: 30.1 % — AB (ref 36.0–46.0)
Hemoglobin: 10.2 g/dL — ABNORMAL LOW (ref 12.0–15.0)
MCH: 27.9 pg (ref 26.0–34.0)
MCHC: 33.9 g/dL (ref 30.0–36.0)
MCV: 82.5 fL (ref 78.0–100.0)
Platelets: 286 10*3/uL (ref 150–400)
RBC: 3.65 MIL/uL — ABNORMAL LOW (ref 3.87–5.11)
RDW: 13.7 % (ref 11.5–15.5)
WBC: 9.7 10*3/uL (ref 4.0–10.5)

## 2018-04-06 LAB — LACTIC ACID, PLASMA: Lactic Acid, Venous: 0.7 mmol/L (ref 0.5–1.9)

## 2018-04-06 LAB — PROTIME-INR
INR: 1.25
Prothrombin Time: 15.6 seconds — ABNORMAL HIGH (ref 11.4–15.2)

## 2018-04-06 MED ORDER — METOPROLOL TARTRATE 12.5 MG HALF TABLET
12.5000 mg | ORAL_TABLET | Freq: Two times a day (BID) | ORAL | Status: DC
  Administered 2018-04-06 – 2018-04-13 (×13): 12.5 mg via ORAL
  Filled 2018-04-06 (×14): qty 1

## 2018-04-06 MED ORDER — SODIUM CHLORIDE 0.9 % IV SOLN
INTRAVENOUS | Status: AC
  Administered 2018-04-07: 05:00:00 via INTRAVENOUS

## 2018-04-06 MED ORDER — POTASSIUM CHLORIDE CRYS ER 20 MEQ PO TBCR
40.0000 meq | EXTENDED_RELEASE_TABLET | Freq: Once | ORAL | Status: AC
  Administered 2018-04-06: 40 meq via ORAL
  Filled 2018-04-06: qty 2

## 2018-04-06 NOTE — Progress Notes (Signed)
PROGRESS NOTE  Joan Elliott EUM:[id number] DOB: 1940-12-01 DOA: 04/05/2018 PCP: [name], MD  HPI/Recap of past 24 hours:  Sitting up in chair, reports generalized lower abdominal pain, no fever, no n/v this am Family at bedside  Assessment/Plan: Principal Problem:   Acute perforated appendicitis Active Problems:   CKD (chronic kidney disease) stage 3, GFR 30-59 ml/min (HCC)   AKI (acute kidney injury) ([place])   HTN (hypertension)    Right lower quadrant intra-abdominal abscess likely consistent with perforated appendicitis.  Cannot rule out mass -on cefepime/flagyl -IR consulted  "Imaging reviewed with Dr Shellia Cleverly, Collection is phlegmon at this time, No drain necessary Rec: antibiotics, Re scan 3-5 days---  If fluid like and approachable- could consider drain placement at that point Please re order drain at that time if needed" -general surgery following, diet advancement per general surgery  Acute anemia: Likely from gi blood loss in the setting of perforated appendicitis hgb 12-10, monitor  Hypokalemia: replace   AKI on CKDIII-IV -cr baseline around 1.5-1.6 -cr on presentation is 3.45 -No evidence of nephrolithiasis or hydronephrosis on Ct ab/pel -ua with rare bacteria, large leuk, urine culture in process, already on abx -hydration, repeat bmp, renal dosing meds. Hold home meds losartan/hctz.  CAD with stent in RCA and h/o CVA, h/o PVD: On betablocker (with holding parameters)/plavix/statin  Diastolic chf No edema on exam Decrease ivf from 125cc/hr to 75cc/hr Close monitor volume status  COPD/Cigarette smoking: No wheezing, no cough, no hypoxia Stable, smoking cessation education provided   Hypothyroidism On synthroid  Gout Stable on allopurinol    Code Status: full  Family Communication: patient and family  Disposition Plan: not ready to discharge   Consultants:  General  surgery  IR  Procedures:  none  Antibiotics:  As above   Objective: BP 111/62 (BP Location: Right Arm)   Pulse 78   Temp 98.8 F (37.1 C) (Oral)   Resp 17   SpO2 99%   Intake/Output Summary (Last 24 hours) at 04/06/2018 0740 Last data filed at 04/06/2018 0600 Gross per 24 hour  Intake 950.04 ml  Output -  Net 950.04 ml   There were no vitals filed for this visit.  Exam: Patient is examined daily including today on 04/06/2018, exams remain the same as of yesterday except that has changed    General:  Dose not look comfortable due to abdominal pain   Cardiovascular: RRR  Respiratory: mild bibasilar crackles, no wheezing, no rhonchi  Abdomen: lower abdominal tender with mild guarding, no rebound, positive BS  Musculoskeletal: No Edema  Neuro: alert, oriented   Data Reviewed: Basic Metabolic Panel: Recent Labs  Lab 04/05/18 1301 04/06/18 0059  NA 135 134*  K 3.4* 3.4*  CL 100 103  CO2 18* 18*  GLUCOSE 101* 88  BUN 45* 49*  CREATININE 3.45* 2.97*  CALCIUM 9.5 8.5*   Liver Function Tests: Recent Labs  Lab 04/05/18 1301  AST 41  ALT 26  ALKPHOS 138*  BILITOT 1.3*  PROT 8.1  ALBUMIN 3.2*   Recent Labs  Lab 04/05/18 1301  LIPASE 29   No results for input(s): AMMONIA in the last 168 hours. CBC: Recent Labs  Lab 04/05/18 1301 04/06/18 0059  WBC 10.9* 9.7  HGB 12.1 10.2*  HCT 36.1 30.1*  MCV 84.1 82.5  PLT 336 286   Cardiac Enzymes:   No results for input(s): CKTOTAL, CKMB, CKMBINDEX, TROPONINI in the last 168 hours. BNP (last 3 results) No results for  input(s): BNP in the last 8760 hours.  ProBNP (last 3 results) No results for input(s): PROBNP in the last 8760 hours.  CBG: No results for input(s): GLUCAP in the last 168 hours.  No results found for this or any previous visit (from the past 240 hour(s)).   Studies: Ct Abdomen Pelvis Wo Contrast  Result Date: 04/05/2018 CLINICAL DATA:  Right lower quadrant pain and flank pain.  EXAM: CT ABDOMEN AND PELVIS WITHOUT CONTRAST TECHNIQUE: Multidetector CT imaging of the abdomen and pelvis was performed following the standard protocol without IV contrast. COMPARISON:  None. FINDINGS: Lower chest: Calcific atherosclerotic disease of the coronary arteries. Calcified mediastinal and hilar lymph nodes, likely due to prior granulomatous disease. Hepatobiliary: No focal liver abnormality is seen. Status post cholecystectomy. No biliary dilatation. Pancreas: Unremarkable. No pancreatic ductal dilatation or surrounding inflammatory changes. Spleen: Normal in size without focal abnormality. Adrenals/Urinary Tract: Bilateral diffuse adrenal thickening. No evidence of nephrolithiasis or hydronephrosis. Stomach/Bowel: Normal appearance of the stomach and small bowel. 6.0 x 5.1 x 6.0 cm ill marginated soft tissue mass versus an abscess emanates from the cecum, just inferior to the ileocecal valve. There is adjacent mesenteric stranding. Surrounding small round lymph nodes also seen. The appendix is not clearly identified. Vascular/Lymphatic: Aortic atherosclerosis. No enlarged abdominal or pelvic lymph nodes. Reproductive: Status post hysterectomy. No adnexal masses. Other: Small fat containing anterior periumbilical abdominal wall hernia. Musculoskeletal: Spondylosis of the lumbosacral spine mild anterolisthesis of L4 on L5, likely degenerative. IMPRESSION: 6 cm ill marginated soft tissue mass versus an abscess emanating from the cecum, just inferior to the ileocecal valve. Adjacent mesenteric stranding and sub pathologic by size criteria but rounded mesenteric lymph nodes. No evidence of nephrolithiasis or hydronephrosis. Bilateral adrenal gland thickening, likely due to adrenal hyperplasia. These results were called by telephone at the time of interpretation on 04/05/2018 at 6:41 pm to Dr. Gilford Raid , who verbally acknowledged these results. Electronically Signed   By: Fidela Salisbury M.D.   On:  04/05/2018 18:45    Scheduled Meds: . allopurinol  100 mg Oral Daily  . atorvastatin  80 mg Oral q1800  . gabapentin  100 mg Oral QHS  . levothyroxine  50 mcg Oral QAC breakfast  . ondansetron (ZOFRAN) IV  4 mg Intravenous Once  . oxybutynin  5 mg Oral BID  . pantoprazole  40 mg Oral BID    Continuous Infusions: . sodium chloride 125 mL/hr at 04/06/18 0343  . ceFEPime (MAXIPIME) IV    . metronidazole 500 mg (04/06/18 0444)     Time spent: 72mins I have personally reviewed and interpreted on  04/06/2018 daily labs, imagings as discussed above under date review session and assessment and plans.  I reviewed all nursing notes, pharmacy notes, consultant notes,  vitals, pertinent old records  I have discussed plan of care as described above with RN , patient and family on 04/06/2018   Florencia Reasons MD, PhD  [facility] Pager [phone]. If 7PM-7AM, please contact night-coverage at [email or web address], password [facility] 04/06/2018, 7:40 AM  LOS: 1 day

## 2018-04-06 NOTE — Progress Notes (Signed)
Joan Elliott is a 77 y.o. female patient admitted from ED awake, alert - oriented  X 4 - no acute distress noted.  VSS - Blood pressure 106/66, pulse 88, temperature 99.2 F (37.3 C), temperature source Oral, resp. rate 16, SpO2 98 %.    IV in place, occlusive dsg intact without redness.      Will cont to eval and treat per MD orders.  Vidal Schwalbe, RN 04/06/2018 1:05 AM

## 2018-04-06 NOTE — Progress Notes (Signed)
[facility] Progress Note     Subjective: CC-  Persistent RLQ abdominal pain. Denies n/v over night. States that she is hungry. IR has been consulted for possible perc drain of abscess.  Objective: Vital signs in last 24 hours: Temp:  [98.7 F (37.1 C)-99.2 F (37.3 C)] 98.8 F (37.1 C) (10/04 0438) Pulse Rate:  [78-99] 78 (10/04 0438) Resp:  [16-20] 17 (10/04 0438) BP: (98-146)/(52-90) 111/62 (10/04 0438) SpO2:  [98 %-100 %] 99 % (10/04 0438) Last BM Date: 03/30/18  Intake/Output from previous day: 10/03 0701 - 10/04 0700 In: 950 [I.V.:750; IV Piggyback:200] Out: -  Intake/Output this shift: No intake/output data recorded.  PE: Gen:  Alert, NAD, pleasant HEENT: EOM's intact, pupils equal and round Card:  RRR Pulm:  Mild bilateral wheezing, effort normal Abd: Soft, mild distension, TTP RLQ with voluntary guarding, +BS Psych: A&Ox3  Skin: no rashes noted, warm and dry  Lab Results:  Recent Labs    04/05/18 1301 04/06/18 0059  WBC 10.9* 9.7  HGB 12.1 10.2*  HCT 36.1 30.1*  PLT 336 286   BMET Recent Labs    04/05/18 1301 04/06/18 0059  NA 135 134*  K 3.4* 3.4*  CL 100 103  CO2 18* 18*  GLUCOSE 101* 88  BUN 45* 49*  CREATININE 3.45* 2.97*  CALCIUM 9.5 8.5*   PT/INR Recent Labs    04/06/18 0749  LABPROT 15.6*  INR 1.25   CMP     Component Value Date/Time   NA 134 (L) 04/06/2018 0059   K 3.4 (L) 04/06/2018 0059   CL 103 04/06/2018 0059   CO2 18 (L) 04/06/2018 0059   GLUCOSE 88 04/06/2018 0059   BUN 49 (H) 04/06/2018 0059   CREATININE 2.97 (H) 04/06/2018 0059   CALCIUM 8.5 (L) 04/06/2018 0059   PROT 8.1 04/05/2018 1301   ALBUMIN 3.2 (L) 04/05/2018 1301   AST 41 04/05/2018 1301   ALT 26 04/05/2018 1301   ALKPHOS 138 (H) 04/05/2018 1301   BILITOT 1.3 (H) 04/05/2018 1301   GFRNONAA 14 (L) 04/06/2018 0059   GFRAA 16 (L) 04/06/2018 0059   Lipase     Component Value Date/Time   LIPASE 29 04/05/2018 1301        Studies/Results: Ct Abdomen Pelvis Wo Contrast  Result Date: 04/05/2018 CLINICAL DATA:  Right lower quadrant pain and flank pain. EXAM: CT ABDOMEN AND PELVIS WITHOUT CONTRAST TECHNIQUE: Multidetector CT imaging of the abdomen and pelvis was performed following the standard protocol without IV contrast. COMPARISON:  None. FINDINGS: Lower chest: Calcific atherosclerotic disease of the coronary arteries. Calcified mediastinal and hilar lymph nodes, likely due to prior granulomatous disease. Hepatobiliary: No focal liver abnormality is seen. Status post cholecystectomy. No biliary dilatation. Pancreas: Unremarkable. No pancreatic ductal dilatation or surrounding inflammatory changes. Spleen: Normal in size without focal abnormality. Adrenals/Urinary Tract: Bilateral diffuse adrenal thickening. No evidence of nephrolithiasis or hydronephrosis. Stomach/Bowel: Normal appearance of the stomach and small bowel. 6.0 x 5.1 x 6.0 cm ill marginated soft tissue mass versus an abscess emanates from the cecum, just inferior to the ileocecal valve. There is adjacent mesenteric stranding. Surrounding small round lymph nodes also seen. The appendix is not clearly identified. Vascular/Lymphatic: Aortic atherosclerosis. No enlarged abdominal or pelvic lymph nodes. Reproductive: Status post hysterectomy. No adnexal masses. Other: Small fat containing anterior periumbilical abdominal wall hernia. Musculoskeletal: Spondylosis of the lumbosacral spine mild anterolisthesis of L4 on L5, likely degenerative. IMPRESSION: 6 cm ill marginated soft tissue mass versus an  abscess emanating from the cecum, just inferior to the ileocecal valve. Adjacent mesenteric stranding and sub pathologic by size criteria but rounded mesenteric lymph nodes. No evidence of nephrolithiasis or hydronephrosis. Bilateral adrenal gland thickening, likely due to adrenal hyperplasia. These results were called by telephone at the time of interpretation on  04/05/2018 at 6:41 pm to Dr. Gilford Raid , who verbally acknowledged these results. Electronically Signed   By: Fidela Salisbury M.D.   On: 04/05/2018 18:45    Anti-infectives: Anti-infectives (From admission, onward)   Start     Dose/Rate Route Frequency Ordered Stop   04/06/18 2000  ceFEPIme (MAXIPIME) 1 g in sodium chloride 0.9 % 100 mL IVPB     1 g 200 mL/hr over 30 Minutes Intravenous Every 24 hours 04/05/18 2005     04/06/18 0600  metroNIDAZOLE (FLAGYL) IVPB 500 mg     500 mg 100 mL/hr over 60 Minutes Intravenous Every 8 hours 04/05/18 1957     04/05/18 1930  ceFEPIme (MAXIPIME) 2 g in sodium chloride 0.9 % 100 mL IVPB     2 g 200 mL/hr over 30 Minutes Intravenous  Once 04/05/18 1921 04/06/18 0104   04/05/18 1930  metroNIDAZOLE (FLAGYL) IVPB 500 mg     500 mg 100 mL/hr over 60 Minutes Intravenous  Once 04/05/18 1921 04/05/18 2236       Assessment/Plan HTN HLD CAD, s/p cardiac stent 2003 - on plavix PAD Gout GERD H/o CVA CKD-III and AKI - Cr trending down 2.97 COPD Hypothyroidism Obese Tobacco abuse  Perforated appendicitis with abscess - cannot rule out mass - Continue broad spectrum antibiotics. IR consult pending for placement of percutaneous drain. [place]gil for clear liquids after procedure from surgical standpoint.  ID - cefepime/flagyl 10/3>> FEN - IVF, NPO VTE - SCDs Foley - none   LOS: 1 day    Joan Elliott , [facility] 04/06/2018, 8:53 AM Pager: [phone] Mon 7:00 am -11:30 AM Tues-Fri 7:00 am-4:30 pm Sat-Sun 7:00 am-11:30 am

## 2018-04-06 NOTE — Progress Notes (Signed)
Patient ID: Joan Elliott, female   DOB: 02/20/1941, 77 y.o.   MRN: [id number]   Request received for abscess drain  Imaging reviewed with Dr Shellia Cleverly Collection is phlegmon at this time No drain necessary  Rec: antibiotics Re scan 3-5 days---  If fluid like and approachable- could consider drain placement at that point  Please re order drain at that time if needed

## 2018-04-07 LAB — COMPREHENSIVE METABOLIC PANEL
ALBUMIN: 2.4 g/dL — AB (ref 3.5–5.0)
ALK PHOS: 105 U/L (ref 38–126)
ALT: 20 U/L (ref 0–44)
ANION GAP: 11 (ref 5–15)
AST: 33 U/L (ref 15–41)
BILIRUBIN TOTAL: 0.8 mg/dL (ref 0.3–1.2)
BUN: 28 mg/dL — AB (ref 8–23)
CALCIUM: 8.8 mg/dL — AB (ref 8.9–10.3)
CO2: 19 mmol/L — AB (ref 22–32)
CREATININE: 1.97 mg/dL — AB (ref 0.44–1.00)
Chloride: 107 mmol/L (ref 98–111)
GFR calc Af Amer: 27 mL/min — ABNORMAL LOW (ref >60)
GFR calc non Af Amer: 23 mL/min — ABNORMAL LOW (ref >60)
GLUCOSE: 116 mg/dL — AB (ref 70–99)
Potassium: 4.1 mmol/L (ref 3.5–5.1)
Sodium: 137 mmol/L (ref 135–145)
Total Protein: 6.7 g/dL (ref 6.5–8.1)

## 2018-04-07 LAB — CBC WITH DIFFERENTIAL/PLATELET
Abs Immature Granulocytes: 0 10*3/uL (ref 0.0–0.1)
Basophils Absolute: 0 10*3/uL (ref 0.0–0.1)
Basophils Relative: 0 %
EOS PCT: 0 %
Eosinophils Absolute: 0 10*3/uL (ref 0.0–0.7)
HEMATOCRIT: 30.4 % — AB (ref 36.0–46.0)
HEMOGLOBIN: 10.2 g/dL — AB (ref 12.0–15.0)
Immature Granulocytes: 0 %
LYMPHS ABS: 0.9 10*3/uL (ref 0.7–4.0)
LYMPHS PCT: 10 %
MCH: 27.7 pg (ref 26.0–34.0)
MCHC: 33.6 g/dL (ref 30.0–36.0)
MCV: 82.6 fL (ref 78.0–100.0)
MONOS PCT: 9 %
Monocytes Absolute: 0.8 10*3/uL (ref 0.1–1.0)
Neutro Abs: 7.5 10*3/uL (ref 1.7–7.7)
Neutrophils Relative %: 81 %
Platelets: 290 10*3/uL (ref 150–400)
RBC: 3.68 MIL/uL — ABNORMAL LOW (ref 3.87–5.11)
RDW: 14.1 % (ref 11.5–15.5)
WBC: 9.3 10*3/uL (ref 4.0–10.5)

## 2018-04-07 LAB — URINE CULTURE: Culture: <10000 — AB

## 2018-04-07 LAB — TSH: TSH: 1.387 u[IU]/mL (ref 0.350–4.500)

## 2018-04-07 NOTE — Plan of Care (Signed)
  Problem: Safety: Goal: Ability to remain free from injury will improve Outcome: Progressing   Problem: Skin Integrity: Goal: Risk for impaired skin integrity will decrease Outcome: Progressing   

## 2018-04-07 NOTE — Progress Notes (Addendum)
PROGRESS NOTE  Joan Elliott DGU:[id number] DOB: 07/08/1940 DOA: 04/05/2018 PCP: [name], MD  HPI/Recap of past 24 hours:  reports right lower abdominal pain is worse today, 10/10 when she moves, Feeling nauseous, no momiting Had bm, reports it was green in color  no fever,    Assessment/Plan: Principal Problem:   Acute perforated appendicitis Active Problems:   CKD (chronic kidney disease) stage 3, GFR 30-59 ml/min (HCC)   AKI (acute kidney injury) ([place])   HTN (hypertension)    Right lower quadrant intra-abdominal abscess likely consistent with perforated appendicitis.  Cannot rule out mass -on cefepime/flagyl -IR consulted  "Imaging reviewed with Dr Shellia Cleverly, Collection is phlegmon at this time, No drain necessary Rec: antibiotics, Re scan 3-5 days---  If fluid like and approachable- could consider drain placement at that point Please re order drain at that time if needed" -general surgery following, diet advancement per general surgery  Acute anemia: Likely from gi blood loss in the setting of perforated appendicitis hgb 06-12-09, monitor  Hypokalemia: replaced   AKI on CKDIII-IV -cr baseline around 1.5-1.6 -cr on presentation is 3.45 -No evidence of nephrolithiasis or hydronephrosis on Ct ab/pel -ua with rare bacteria, large leuk, urine culture <10,000 colinies, already on abx -hydration, repeat bmp, renal dosing meds. Hold home meds losartan/hctz. -cr improving, but not back to baseline yet  CAD with stent in RCA and h/o CVA, h/o PVD: On betablocker (with holding parameters)/plavix/statin  Diastolic chf No edema on exam Decrease ivf from 125cc/hr to 75cc/hr Close monitor volume status  COPD/Cigarette smoking: No wheezing, no cough, no hypoxia Stable, smoking cessation education provided   Hypothyroidism On synthroid  Gout Stable on allopurinol    Code Status: full  Family Communication: patient and family on 10/4  Disposition  Plan: not ready to discharge   Consultants:  General surgery  IR  Procedures:  none  Antibiotics:  As above   Objective: BP (!) 107/57 (BP Location: Left Arm)   Pulse 76   Temp 98.6 F (37 C) (Oral)   Resp 18   SpO2 99%   Intake/Output Summary (Last 24 hours) at 04/07/2018 0909 Last data filed at 04/07/2018 0730 Gross per 24 hour  Intake 2037.5 ml  Output 1604 ml  Net 433.5 ml   There were no vitals filed for this visit.  Exam: Patient is examined daily including today on 04/07/2018, exams remain the same as of yesterday except that has changed    General:  Dose not look comfortable due to abdominal pain   Cardiovascular: RRR  Respiratory: mild bibasilar crackles, no wheezing, no rhonchi  Abdomen: lower abdominal tender with mild guarding, no rebound, positive BS  Musculoskeletal: No Edema  Neuro: alert, oriented   Data Reviewed: Basic Metabolic Panel: Recent Labs  Lab 04/05/18 1301 04/06/18 0059 04/07/18 0537  NA 135 134* 137  K 3.4* 3.4* 4.1  CL 100 103 107  CO2 18* 18* 19*  GLUCOSE 101* 88 116*  BUN 45* 49* 28*  CREATININE 3.45* 2.97* 1.97*  CALCIUM 9.5 8.5* 8.8*   Liver Function Tests: Recent Labs  Lab 04/05/18 1301 04/07/18 0537  AST 41 33  ALT 26 20  ALKPHOS 138* 105  BILITOT 1.3* 0.8  PROT 8.1 6.7  ALBUMIN 3.2* 2.4*   Recent Labs  Lab 04/05/18 1301  LIPASE 29   No results for input(s): AMMONIA in the last 168 hours. CBC: Recent Labs  Lab 04/05/18 1301 04/06/18 0059 04/07/18 0537  WBC 10.9*  9.7 9.3  NEUTROABS  --   --  7.5  HGB 12.1 10.2* 10.2*  HCT 36.1 30.1* 30.4*  MCV 84.1 82.5 82.6  PLT 336 286 290   Cardiac Enzymes:   No results for input(s): CKTOTAL, CKMB, CKMBINDEX, TROPONINI in the last 168 hours. BNP (last 3 results) No results for input(s): BNP in the last 8760 hours.  ProBNP (last 3 results) No results for input(s): PROBNP in the last 8760 hours.  CBG: No results for input(s): GLUCAP in the last  168 hours.  No results found for this or any previous visit (from the past 240 hour(s)).   Studies: No results found.  Scheduled Meds: . allopurinol  100 mg Oral Daily  . atorvastatin  80 mg Oral q1800  . gabapentin  100 mg Oral QHS  . levothyroxine  50 mcg Oral QAC breakfast  . metoprolol tartrate  12.5 mg Oral BID  . ondansetron (ZOFRAN) IV  4 mg Intravenous Once  . oxybutynin  5 mg Oral BID  . pantoprazole  40 mg Oral BID    Continuous Infusions: . sodium chloride 75 mL/hr at 04/07/18 0506  . ceFEPime (MAXIPIME) IV 1 g (04/06/18 1937)  . metronidazole 500 mg (04/07/18 0505)     Time spent: 68mins I have personally reviewed and interpreted on  04/07/2018 daily labs, imagings as discussed above under date review session and assessment and plans.  I reviewed all nursing notes, pharmacy notes, consultant notes,  vitals, pertinent old records  I have discussed plan of care as described above with RN , patient  on 04/07/2018   Florencia Reasons MD, PhD  [facility] Pager [phone]. If 7PM-7AM, please contact night-coverage at [email or web address], password [facility] 04/07/2018, 9:09 AM  LOS: 2 days

## 2018-04-07 NOTE — Progress Notes (Addendum)
[facility] Progress Note     Subjective: CC:  Reports abdominal pain is 10/10 today. Does not feel the pain is worse after PO intake. Endorses mild nausea after eating but denies vomiting. Had a loose, green BM this AM. Is not mobilizing due to pain.   Objective: Vital signs in last 24 hours: Temp:  [98.6 F (37 C)-100.3 F (37.9 C)] 98.6 F (37 C) (10/05 0500) Pulse Rate:  [72-87] 76 (10/05 0500) Resp:  [16-18] 18 (10/05 0500) BP: (96-107)/(57-63) 107/57 (10/05 0500) SpO2:  [96 %-99 %] 99 % (10/05 0500) Last BM Date: 04/06/18  Intake/Output from previous day: 10/04 0701 - 10/05 0700 In: 2037.5 [P.O.:300; I.V.:1337.5; IV Piggyback:400] Out: 1751 [Urine:1352; Stool:2] Intake/Output this shift: Total I/O In: -  Out: 250 [Urine:250]  PE: Gen:  Alert, NAD, pleasant and cooperative  HEENT: EOM's intact, pupils equal and round Card:  RRR Pulm:  Mild bilateral wheezing, effort normal Abd: Soft, obese, TTP RLQ with voluntary guarding, +BS  Psych: A&Ox3  Skin: no rashes noted, warm and dry  Lab Results:  Recent Labs    04/06/18 0059 04/07/18 0537  WBC 9.7 9.3  HGB 10.2* 10.2*  HCT 30.1* 30.4*  PLT 286 290   BMET Recent Labs    04/06/18 0059 04/07/18 0537  NA 134* 137  K 3.4* 4.1  CL 103 107  CO2 18* 19*  GLUCOSE 88 116*  BUN 49* 28*  CREATININE 2.97* 1.97*  CALCIUM 8.5* 8.8*   PT/INR Recent Labs    04/06/18 0749  LABPROT 15.6*  INR 1.25   CMP     Component Value Date/Time   NA 137 04/07/2018 0537   K 4.1 04/07/2018 0537   CL 107 04/07/2018 0537   CO2 19 (L) 04/07/2018 0537   GLUCOSE 116 (H) 04/07/2018 0537   BUN 28 (H) 04/07/2018 0537   CREATININE 1.97 (H) 04/07/2018 0537   CALCIUM 8.8 (L) 04/07/2018 0537   PROT 6.7 04/07/2018 0537   ALBUMIN 2.4 (L) 04/07/2018 0537   AST 33 04/07/2018 0537   ALT 20 04/07/2018 0537   ALKPHOS 105 04/07/2018 0537   BILITOT 0.8 04/07/2018 0537   GFRNONAA 23 (L) 04/07/2018 0537   GFRAA 27 (L)  04/07/2018 0537   Lipase     Component Value Date/Time   LIPASE 29 04/05/2018 1301       Studies/Results: Ct Abdomen Pelvis Wo Contrast  Result Date: 04/05/2018 CLINICAL DATA:  Right lower quadrant pain and flank pain. EXAM: CT ABDOMEN AND PELVIS WITHOUT CONTRAST TECHNIQUE: Multidetector CT imaging of the abdomen and pelvis was performed following the standard protocol without IV contrast. COMPARISON:  None. FINDINGS: Lower chest: Calcific atherosclerotic disease of the coronary arteries. Calcified mediastinal and hilar lymph nodes, likely due to prior granulomatous disease. Hepatobiliary: No focal liver abnormality is seen. Status post cholecystectomy. No biliary dilatation. Pancreas: Unremarkable. No pancreatic ductal dilatation or surrounding inflammatory changes. Spleen: Normal in size without focal abnormality. Adrenals/Urinary Tract: Bilateral diffuse adrenal thickening. No evidence of nephrolithiasis or hydronephrosis. Stomach/Bowel: Normal appearance of the stomach and small bowel. 6.0 x 5.1 x 6.0 cm ill marginated soft tissue mass versus an abscess emanates from the cecum, just inferior to the ileocecal valve. There is adjacent mesenteric stranding. Surrounding small round lymph nodes also seen. The appendix is not clearly identified. Vascular/Lymphatic: Aortic atherosclerosis. No enlarged abdominal or pelvic lymph nodes. Reproductive: Status post hysterectomy. No adnexal masses. Other: Small fat containing anterior periumbilical abdominal wall hernia. Musculoskeletal: Spondylosis of  the lumbosacral spine mild anterolisthesis of L4 on L5, likely degenerative. IMPRESSION: 6 cm ill marginated soft tissue mass versus an abscess emanating from the cecum, just inferior to the ileocecal valve. Adjacent mesenteric stranding and sub pathologic by size criteria but rounded mesenteric lymph nodes. No evidence of nephrolithiasis or hydronephrosis. Bilateral adrenal gland thickening, likely due to adrenal  hyperplasia. These results were called by telephone at the time of interpretation on 04/05/2018 at 6:41 pm to Dr. Gilford Raid , who verbally acknowledged these results. Electronically Signed   By: Fidela Salisbury M.D.   On: 04/05/2018 18:45    Anti-infectives: Anti-infectives (From admission, onward)   Start     Dose/Rate Route Frequency Ordered Stop   04/06/18 2000  ceFEPIme (MAXIPIME) 1 g in sodium chloride 0.9 % 100 mL IVPB     1 g 200 mL/hr over 30 Minutes Intravenous Every 24 hours 04/05/18 2005     04/06/18 0600  metroNIDAZOLE (FLAGYL) IVPB 500 mg     500 mg 100 mL/hr over 60 Minutes Intravenous Every 8 hours 04/05/18 1957     04/05/18 1930  ceFEPIme (MAXIPIME) 2 g in sodium chloride 0.9 % 100 mL IVPB     2 g 200 mL/hr over 30 Minutes Intravenous  Once 04/05/18 1921 04/06/18 0104   04/05/18 1930  metroNIDAZOLE (FLAGYL) IVPB 500 mg     500 mg 100 mL/hr over 60 Minutes Intravenous  Once 04/05/18 1921 04/05/18 2236     Assessment/Plan HTN HLD CAD, s/p cardiac stent 2003 - on plavix PAD Gout GERD H/o CVA CKD-III and AKI - Cr trending down 2.97 COPD Hypothyroidism Obese Tobacco abuse  Perforated appendicitis with abscess  - cannot rule out mass  - afebrile, VSS, leukocytosis resolved (9,300)  - IR denied drain placement 10/4, states it looks more phlegmonous - rec repeat CT in 3-5 days.  - Continue broad spectrum antibiotics. - continue clears  - mobilize  ID - cefepime/flagyl 10/3>> FEN - IVF, clears - would not advance diet today given pain/nausea.  VTE - SCDs Foley - none   LOS: 2 days   Obie Dredge, [facility] Pager: [phone]  Agree with above.  Still complains of a lot of pain - but she has good BS.  And she is taking po's.  Alphonsa Overall, MD, [facility] Pager: [phone] Office phone:  [phone]

## 2018-04-08 ENCOUNTER — Other Ambulatory Visit: Payer: Self-pay

## 2018-04-08 LAB — CBC
HEMATOCRIT: 30.2 % — AB (ref 36.0–46.0)
Hemoglobin: 10.3 g/dL — ABNORMAL LOW (ref 12.0–15.0)
MCH: 28.3 pg (ref 26.0–34.0)
MCHC: 34.1 g/dL (ref 30.0–36.0)
MCV: 83 fL (ref 78.0–100.0)
Platelets: 292 10*3/uL (ref 150–400)
RBC: 3.64 MIL/uL — ABNORMAL LOW (ref 3.87–5.11)
RDW: 14.2 % (ref 11.5–15.5)
WBC: 10.3 10*3/uL (ref 4.0–10.5)

## 2018-04-08 LAB — BASIC METABOLIC PANEL
ANION GAP: 11 (ref 5–15)
BUN: 16 mg/dL (ref 8–23)
CALCIUM: 8.7 mg/dL — AB (ref 8.9–10.3)
CO2: 20 mmol/L — ABNORMAL LOW (ref 22–32)
CREATININE: 1.59 mg/dL — AB (ref 0.44–1.00)
Chloride: 107 mmol/L (ref 98–111)
GFR calc Af Amer: 35 mL/min — ABNORMAL LOW (ref >60)
GFR, EST NON AFRICAN AMERICAN: 30 mL/min — AB (ref >60)
GLUCOSE: 99 mg/dL (ref 70–99)
Potassium: 4 mmol/L (ref 3.5–5.1)
Sodium: 138 mmol/L (ref 135–145)

## 2018-04-08 MED ORDER — WHITE PETROLATUM EX OINT
TOPICAL_OINTMENT | CUTANEOUS | Status: AC
  Administered 2018-04-08: 0.2
  Filled 2018-04-08: qty 28.35

## 2018-04-08 NOTE — Progress Notes (Signed)
PROGRESS NOTE  Joan Elliott LEX:[id number] DOB: 1941-04-04 DOA: 04/05/2018 PCP: [name], MD  HPI/Recap of past 24 hours:  reports less right lower abdominal pain, less nauseous ,  no vomiting No bm today  no fever,    Assessment/Plan: Principal Problem:   Acute perforated appendicitis Active Problems:   CKD (chronic kidney disease) stage 3, GFR 30-59 ml/min (HCC)   AKI (acute kidney injury) ([place])   HTN (hypertension)    Right lower quadrant intra-abdominal abscess likely consistent with perforated appendicitis.  Cannot rule out mass -on cefepime/flagyl -IR consulted  "Imaging reviewed with Dr Shellia Cleverly, Collection is phlegmon at this time, No drain necessary Rec: antibiotics, Re scan 3-5 days---  If fluid like and approachable- could consider drain placement at that point Please re order drain at that time if needed" -general surgery following, diet advancement per general surgery  Acute anemia: Likely from gi blood loss in the setting of perforated appendicitis hgb 06-12-09, monitor  Hypokalemia: replaced   AKI on CKDIII-IV -cr baseline around 1.5-1.6 -cr on presentation is 3.45 -No evidence of nephrolithiasis or hydronephrosis on Ct ab/pel -ua with rare bacteria, large leuk, urine culture <10,000 colinies, already on abx -hydration, repeat bmp, renal dosing meds. Hold home meds losartan/hctz. -cr improving, today is close to baseline  CAD with stent in RCA and h/o CVA, h/o PVD: On betablocker (with holding parameters)/plavix/statin  Diastolic chf No edema on exam Decrease ivf from 125cc/hr to 75cc/hr, d/c ivf on 10/6 Close monitor volume status  COPD/Cigarette smoking: No wheezing, no cough, no hypoxia Stable, smoking cessation education provided   Hypothyroidism On synthroid  Gout Stable on allopurinol    Code Status: full  Family Communication: patient and family on 10/4  Disposition Plan: not ready to  discharge   Consultants:  General surgery  IR  Procedures:  none  Antibiotics:  As above   Objective: BP 125/66 (BP Location: Right Arm)   Pulse 81   Temp 98.7 F (37.1 C) (Oral)   Resp 19   SpO2 99%   Intake/Output Summary (Last 24 hours) at 04/08/2018 1239 Last data filed at 04/08/2018 0529 Gross per 24 hour  Intake 1217.06 ml  Output 2050 ml  Net -832.94 ml   There were no vitals filed for this visit.  Exam: Patient is examined daily including today on 04/08/2018, exams remain the same as of yesterday except that has changed    General:  NAD  Cardiovascular: RRR  Respiratory: mild bibasilar crackles, no wheezing, no rhonchi  Abdomen: lower abdominal tender with mild guarding, no rebound, positive BS  Musculoskeletal: No Edema  Neuro: alert, oriented   Data Reviewed: Basic Metabolic Panel: Recent Labs  Lab 04/05/18 1301 04/06/18 0059 04/07/18 0537 04/08/18 0419  NA 135 134* 137 138  K 3.4* 3.4* 4.1 4.0  CL 100 103 107 107  CO2 18* 18* 19* 20*  GLUCOSE 101* 88 116* 99  BUN 45* 49* 28* 16  CREATININE 3.45* 2.97* 1.97* 1.59*  CALCIUM 9.5 8.5* 8.8* 8.7*   Liver Function Tests: Recent Labs  Lab 04/05/18 1301 04/07/18 0537  AST 41 33  ALT 26 20  ALKPHOS 138* 105  BILITOT 1.3* 0.8  PROT 8.1 6.7  ALBUMIN 3.2* 2.4*   Recent Labs  Lab 04/05/18 1301  LIPASE 29   No results for input(s): AMMONIA in the last 168 hours. CBC: Recent Labs  Lab 04/05/18 1301 04/06/18 0059 04/07/18 0537 04/08/18 0419  WBC 10.9* 9.7 9.3 10.3  NEUTROABS  --   --  7.5  --   HGB 12.1 10.2* 10.2* 10.3*  HCT 36.1 30.1* 30.4* 30.2*  MCV 84.1 82.5 82.6 83.0  PLT 336 286 290 292   Cardiac Enzymes:   No results for input(s): CKTOTAL, CKMB, CKMBINDEX, TROPONINI in the last 168 hours. BNP (last 3 results) No results for input(s): BNP in the last 8760 hours.  ProBNP (last 3 results) No results for input(s): PROBNP in the last 8760 hours.  CBG: No results  for input(s): GLUCAP in the last 168 hours.  Recent Results (from the past 240 hour(s))  Culture, Urine     Status: Abnormal   Collection Time: 04/05/18  8:02 PM  Result Value Ref Range Status   Specimen Description URINE, RANDOM  Final   Special Requests NONE  Final   Culture (A)  Final    <10,000 COLONIES/mL INSIGNIFICANT GROWTH Performed at [facility], [address] [place], [place] [address]    Report Status 04/07/2018 FINAL  Final     Studies: No results found.  Scheduled Meds: . allopurinol  100 mg Oral Daily  . atorvastatin  80 mg Oral q1800  . gabapentin  100 mg Oral QHS  . levothyroxine  50 mcg Oral QAC breakfast  . metoprolol tartrate  12.5 mg Oral BID  . ondansetron (ZOFRAN) IV  4 mg Intravenous Once  . oxybutynin  5 mg Oral BID  . pantoprazole  40 mg Oral BID    Continuous Infusions: . ceFEPime (MAXIPIME) IV Stopped (04/07/18 2127)  . metronidazole 500 mg (04/08/18 7893)     Time spent: 61mins I have personally reviewed and interpreted on  04/08/2018 daily labs, imagings as discussed above under date review session and assessment and plans.  I reviewed all nursing notes, pharmacy notes, consultant notes,  vitals, pertinent old records  I have discussed plan of care as described above with RN , patient  on 04/08/2018   Florencia Reasons MD, PhD  [facility] Pager [phone]. If 7PM-7AM, please contact night-coverage at [email or web address], password [facility] 04/08/2018, 12:39 PM  LOS: 3 days

## 2018-04-08 NOTE — Plan of Care (Signed)
  Problem: Education: Goal: Knowledge of General Education information will improve Description Including pain rating scale, medication(s)/side effects and non-pharmacologic comfort measures Outcome: Progressing   Problem: Health Behavior/Discharge Planning: Goal: Ability to manage health-related needs will improve Outcome: Progressing   Problem: Clinical Measurements: Goal: Ability to maintain clinical measurements within normal limits will improve Outcome: Progressing Goal: Will remain free from infection Outcome: Progressing Goal: Diagnostic test results will improve Outcome: Progressing   Problem: Activity: Goal: Risk for activity intolerance will decrease Outcome: Progressing   Problem: Nutrition: Goal: Adequate nutrition will be maintained Outcome: Progressing   Problem: Coping: Goal: Level of anxiety will decrease Outcome: Progressing   Problem: Elimination: Goal: Will not experience complications related to bowel motility Outcome: Progressing Goal: Will not experience complications related to urinary retention Outcome: Progressing   Problem: Pain Managment: Goal: General experience of comfort will improve Outcome: Progressing   Problem: Safety: Goal: Ability to remain free from injury will improve Outcome: Progressing   Problem: Skin Integrity: Goal: Risk for impaired skin integrity will decrease Outcome: Progressing  Forestine Chute, RN 04/08/2018 11:10 PM

## 2018-04-08 NOTE — Progress Notes (Addendum)
[facility] Progress Note     Subjective: CC:  Feels slightly better today - rates pain as 7-8/10 over RLQ. +flatus. Denies BM yesterday. Tolerating clears with less nausea. Sitting up in chair.   Objective: Vital signs in last 24 hours: Temp:  [98.4 F (36.9 C)-99.5 F (37.5 C)] 98.7 F (37.1 C) (10/06 0524) Pulse Rate:  [70-83] 81 (10/06 0524) Resp:  [19-22] 19 (10/06 0524) BP: (105-125)/(58-74) 125/66 (10/06 0524) SpO2:  [99 %-100 %] 99 % (10/06 0524) Last BM Date: 04/07/18  Intake/Output from previous day: 10/05 0701 - 10/06 0700 In: 2146.2 [P.O.:1500; I.V.:349.2; IV Piggyback:297.1] Out: 2700 [Urine:2700] Intake/Output this shift: No intake/output data recorded.  PE: Gen:  Alert, NAD, pleasant Card:  Regular rate and rhythm, pedal pulses 2+ BL Pulm:  Normal effort, clear to auscultation bilaterally Abd: Soft, obese, TTP RUQ and RLQ without peritonitis, +BS Skin: warm and dry, no rashes  Psych: A&Ox3   Lab Results:  Recent Labs    04/07/18 0537 04/08/18 0419  WBC 9.3 10.3  HGB 10.2* 10.3*  HCT 30.4* 30.2*  PLT 290 292   BMET Recent Labs    04/07/18 0537 04/08/18 0419  NA 137 138  K 4.1 4.0  CL 107 107  CO2 19* 20*  GLUCOSE 116* 99  BUN 28* 16  CREATININE 1.97* 1.59*  CALCIUM 8.8* 8.7*   PT/INR Recent Labs    04/06/18 0749  LABPROT 15.6*  INR 1.25   CMP     Component Value Date/Time   NA 138 04/08/2018 0419   K 4.0 04/08/2018 0419   CL 107 04/08/2018 0419   CO2 20 (L) 04/08/2018 0419   GLUCOSE 99 04/08/2018 0419   BUN 16 04/08/2018 0419   CREATININE 1.59 (H) 04/08/2018 0419   CALCIUM 8.7 (L) 04/08/2018 0419   PROT 6.7 04/07/2018 0537   ALBUMIN 2.4 (L) 04/07/2018 0537   AST 33 04/07/2018 0537   ALT 20 04/07/2018 0537   ALKPHOS 105 04/07/2018 0537   BILITOT 0.8 04/07/2018 0537   GFRNONAA 30 (L) 04/08/2018 0419   GFRAA 35 (L) 04/08/2018 0419   Lipase     Component Value Date/Time   LIPASE 29 04/05/2018 1301    Studies/Results: No results found.  Anti-infectives: Anti-infectives (From admission, onward)   Start     Dose/Rate Route Frequency Ordered Stop   04/06/18 2000  ceFEPIme (MAXIPIME) 1 g in sodium chloride 0.9 % 100 mL IVPB     1 g 200 mL/hr over 30 Minutes Intravenous Every 24 hours 04/05/18 2005     04/06/18 0600  metroNIDAZOLE (FLAGYL) IVPB 500 mg     500 mg 100 mL/hr over 60 Minutes Intravenous Every 8 hours 04/05/18 1957     04/05/18 1930  ceFEPIme (MAXIPIME) 2 g in sodium chloride 0.9 % 100 mL IVPB     2 g 200 mL/hr over 30 Minutes Intravenous  Once 04/05/18 1921 04/06/18 0104   04/05/18 1930  metroNIDAZOLE (FLAGYL) IVPB 500 mg     500 mg 100 mL/hr over 60 Minutes Intravenous  Once 04/05/18 1921 04/05/18 2236     Assessment/Plan HTN HLD CAD, s/p cardiac stent 2003 - on plavix PAD Gout GERD H/o CVA CKD-III and AKI - Cr trending down 1.59 COPD Hypothyroidism Obese Tobacco abuse  Perforated appendicitis with abscess             - cannot rule out mass             -  afebrile, VSS, leukocytosis resolved (10,300 today from 9,300)             - IR denied drain placement 10/4, states it looks more phlegmonous; Repeat CT in 1-2 days.             - Continue broad spectrum antibiotics.  - advance diet to fulls.   - mobilize  ID -cefepime/flagyl 10/3>> (DAY#3) FEN -IVF, allow fulls today.  VTE -SCDs Foley -none   LOS: 3 days    Obie Dredge, [facility] Pager: [phone]  Agree with above. WBC - 10,300 - 04/08/2018 A little better  Alphonsa Overall, MD, [facility] Pager: [phone] Office phone:  [phone]

## 2018-04-09 LAB — CBC WITH DIFFERENTIAL/PLATELET
ABS IMMATURE GRANULOCYTES: 0 10*3/uL (ref 0.0–0.1)
BASOS ABS: 0 10*3/uL (ref 0.0–0.1)
BASOS PCT: 0 %
EOS ABS: 0.1 10*3/uL (ref 0.0–0.7)
EOS PCT: 1 %
HCT: 29.4 % — ABNORMAL LOW (ref 36.0–46.0)
Hemoglobin: 9.9 g/dL — ABNORMAL LOW (ref 12.0–15.0)
Immature Granulocytes: 0 %
Lymphocytes Relative: 17 %
Lymphs Abs: 1.3 10*3/uL (ref 0.7–4.0)
MCH: 28 pg (ref 26.0–34.0)
MCHC: 33.7 g/dL (ref 30.0–36.0)
MCV: 83.1 fL (ref 78.0–100.0)
MONO ABS: 0.7 10*3/uL (ref 0.1–1.0)
MONOS PCT: 9 %
Neutro Abs: 5.5 10*3/uL (ref 1.7–7.7)
Neutrophils Relative %: 73 %
PLATELETS: 290 10*3/uL (ref 150–400)
RBC: 3.54 MIL/uL — ABNORMAL LOW (ref 3.87–5.11)
RDW: 14.2 % (ref 11.5–15.5)
WBC: 7.7 10*3/uL (ref 4.0–10.5)

## 2018-04-09 LAB — BASIC METABOLIC PANEL
Anion gap: 9 (ref 5–15)
BUN: 13 mg/dL (ref 8–23)
CO2: 21 mmol/L — ABNORMAL LOW (ref 22–32)
CREATININE: 1.43 mg/dL — AB (ref 0.44–1.00)
Calcium: 8.3 mg/dL — ABNORMAL LOW (ref 8.9–10.3)
Chloride: 107 mmol/L (ref 98–111)
GFR calc Af Amer: 39 mL/min — ABNORMAL LOW (ref >60)
GFR calc non Af Amer: 34 mL/min — ABNORMAL LOW (ref >60)
GLUCOSE: 93 mg/dL (ref 70–99)
Potassium: 4.1 mmol/L (ref 3.5–5.1)
Sodium: 137 mmol/L (ref 135–145)

## 2018-04-09 LAB — MAGNESIUM: Magnesium: 1.6 mg/dL — ABNORMAL LOW (ref 1.7–2.4)

## 2018-04-09 MED ORDER — MAGNESIUM SULFATE IN D5W 1-5 GM/100ML-% IV SOLN
1.0000 g | Freq: Once | INTRAVENOUS | Status: AC
  Administered 2018-04-09: 1 g via INTRAVENOUS
  Filled 2018-04-09: qty 100

## 2018-04-09 MED ORDER — ADULT MULTIVITAMIN W/MINERALS CH
1.0000 | ORAL_TABLET | Freq: Every day | ORAL | Status: DC
  Administered 2018-04-09 – 2018-04-10 (×2): 1 via ORAL
  Filled 2018-04-09 (×3): qty 1

## 2018-04-09 MED ORDER — ENSURE ENLIVE PO LIQD
237.0000 mL | Freq: Two times a day (BID) | ORAL | Status: DC
  Administered 2018-04-09 – 2018-04-10 (×4): 237 mL via ORAL

## 2018-04-09 NOTE — Progress Notes (Signed)
Initial Nutrition Assessment  DOCUMENTATION CODES:   Obesity unspecified  INTERVENTION:   -Continue Ensure Enlive po BID, each supplement provides 350 kcal and 20 grams of protein -MVI with minerals daily  NUTRITION DIAGNOSIS:   Inadequate oral intake related to altered GI function, decreased appetite as evidenced by meal completion < 50%, per patient/family report.  GOAL:   Patient will meet greater than or equal to 90% of their needs  MONITOR:   PO intake, Supplement acceptance, Diet advancement, Labs, Weight trends, Skin, I & O's  REASON FOR ASSESSMENT:   Malnutrition Screening Tool    ASSESSMENT:   Joan Elliott is a 77 y.o. female with medical history significant of CKD stage 3, HTN, stroke, CAD. Patient presents to the ED with ~7 day history of worsening RLQ abd pain.  Associated anorexia and poor PO intake.  Passing gas but no BM in past couple of days.  Symptoms worsening, nothing makes it better nor worse.  Had nausea without vomiting.  Had chills but no fevers.  Urine color change over past week.  Pt admitted with acute perforated appendicitis with abscess.   10/4- per IR notes, drain noted placed as collection is phlegmon   Pt in good spirits today, making jokes with mobility tech while preparing for walk.   Per H&P, pt with decreased appetite over the past week. Observed breakfast tray- pt consumed 100% of milk and pudding, 75% of oatmeal, and a few sips of juice. Per discussion with RN, Ensure supplements were added by MD and RN just provided pt one prior to visit- pt completed supplement by the time RD entered the room. Noted meal completion decreased, but improved (PO 25-65%). Agree with Ensure supplements due to decreased oral intake and limitations of liquid diet.  Reviewed wt hx; pt has experienced a 2.9% wt loss over the past 6 months, which is not significant for time frame. Pt also with no signs of fat and muscle depletion on exam.   Per MD notes, plan  to repeat CT scan tomorrow (04/10/18).   Labs reviewed: Mg: 1.6, K WDL.   NUTRITION - FOCUSED PHYSICAL EXAM:    Most Recent Value  Orbital Region  No depletion  Upper Arm Region  No depletion  Thoracic and Lumbar Region  No depletion  Buccal Region  No depletion  Temple Region  No depletion  Clavicle Bone Region  No depletion  Clavicle and Acromion Bone Region  No depletion  Scapular Bone Region  No depletion  Dorsal Hand  No depletion  Patellar Region  No depletion  Anterior Thigh Region  No depletion  Posterior Calf Region  No depletion  Edema (RD Assessment)  None  Hair  Reviewed  Eyes  Reviewed  Mouth  Reviewed  Skin  Reviewed  Nails  Reviewed       Diet Order:   Diet Order            Diet full liquid Room service appropriate? Yes; Fluid consistency: Thin  Diet effective now              EDUCATION NEEDS:   No education needs have been identified at this time  Skin:  Skin Assessment: Reviewed RN Assessment  Last BM:  04/08/18  Height:   Ht Readings from Last 1 Encounters:  04/09/18 5' (1.524 m)    Weight:   Wt Readings from Last 1 Encounters:  04/09/18 92.5 kg    Ideal Body Weight:  45.5 kg  BMI:  Body mass index is 39.83 kg/m.  Estimated Nutritional Needs:   Kcal:  1600-1800  Protein:  75-90 grams  Fluid:  >1.6 L   Khalifa Knecht A. Jimmye Norman, RD, LDN, CDE Pager: [phone] After hours Pager: [phone]

## 2018-04-09 NOTE — Progress Notes (Signed)
   Subjective/Chief Complaint: Feels better, flatus, no bm, no n/v   Objective: Vital signs in last 24 hours: Temp:  [98.3 F (36.8 C)-99.9 F (37.7 C)] 99 F (37.2 C) (10/07 0616) Pulse Rate:  [68-95] 71 (10/07 0616) Resp:  [16-18] 18 (10/07 0616) BP: (92-114)/(51-65) 114/61 (10/07 0616) SpO2:  [96 %-100 %] 100 % (10/07 0616) Weight:  [92.5 kg] 92.5 kg (10/07 0314) Last BM Date: 04/08/18  Intake/Output from previous day: 10/06 0701 - 10/07 0700 In: 962 [P.O.:462; IV Piggyback:500] Out: 400 [Urine:400] Intake/Output this shift: Total I/O In: 222 [P.O.:222] Out: -   GI: mild right sided tenderness, she says this is better, nondistended bs present  Lab Results:  Recent Labs    04/08/18 0419 04/09/18 0603  WBC 10.3 7.7  HGB 10.3* 9.9*  HCT 30.2* 29.4*  PLT 292 290   BMET Recent Labs    04/08/18 0419 04/09/18 0603  NA 138 137  K 4.0 4.1  CL 107 107  CO2 20* 21*  GLUCOSE 99 93  BUN 16 13  CREATININE 1.59* 1.43*  CALCIUM 8.7* 8.3*   PT/INR No results for input(s): LABPROT, INR in the last 72 hours. ABG No results for input(s): PHART, HCO3 in the last 72 hours.  Invalid input(s): PCO2, PO2  Studies/Results: No results found.  Anti-infectives: Anti-infectives (From admission, onward)   Start     Dose/Rate Route Frequency Ordered Stop   04/06/18 2000  ceFEPIme (MAXIPIME) 1 g in sodium chloride 0.9 % 100 mL IVPB     1 g 200 mL/hr over 30 Minutes Intravenous Every 24 hours 04/05/18 2005     04/06/18 0600  metroNIDAZOLE (FLAGYL) IVPB 500 mg     500 mg 100 mL/hr over 60 Minutes Intravenous Every 8 hours 04/05/18 1957     04/05/18 1930  ceFEPIme (MAXIPIME) 2 g in sodium chloride 0.9 % 100 mL IVPB     2 g 200 mL/hr over 30 Minutes Intravenous  Once 04/05/18 1921 04/06/18 0104   04/05/18 1930  metroNIDAZOLE (FLAGYL) IVPB 500 mg     500 mg 100 mL/hr over 60 Minutes Intravenous  Once 04/05/18 1921 04/05/18 2236      Assessment/Plan:  Perforated  appendicitis with abscess - cannot rule out mass - afebrile, VSS, leukocytosis resolved - IR denied drain placement 10/4, states it looks more phlegmonous; Repeat CT tomorrow - Continue broad spectrum antibiotics.             - advance diet to fulls with supplements             - mobilize ID -cefepime/flagyl 10/3>> (DAY#4) FEN -IVF,fulls VTE -SCDs    Rolm Bookbinder 04/09/2018

## 2018-04-09 NOTE — Progress Notes (Signed)
PROGRESS NOTE  Joan Elliott WLN:[id number] DOB: Aug 13, 1940 DOA: 04/05/2018 PCP: [name], MD  HPI/Recap of past 24 hours:  reports less right lower abdominal pain, less nauseous ,  no vomiting,   bm x3 last night  no fever,    Assessment/Plan: Principal Problem:   Acute perforated appendicitis Active Problems:   CKD (chronic kidney disease) stage 3, GFR 30-59 ml/min (HCC)   AKI (acute kidney injury) ([place])   HTN (hypertension)    Right lower quadrant intra-abdominal abscess likely consistent with perforated appendicitis.  Cannot rule out mass -on cefepime/flagyl -IR consulted  "Imaging reviewed with Dr Shellia Cleverly, Collection is phlegmon at this time, No drain necessary Rec: antibiotics, Re scan 3-5 days---  If fluid like and approachable- could consider drain placement at that point Please re order drain at that time if needed" -general surgery following, diet advancement per general surgery  Acute anemia: Likely from gi blood loss in the setting of perforated appendicitis hgb 06-12-09-9.9, monitor Currently on scd for DVT prophylaxis, if hgb stable, no plan for IR or surgical intervention, will add on prophylaxis lovenox  Hypokalemia/hypomagnesemia: replace k/mag Keep k>4, mag>2   AKI on CKDIII-IV -cr baseline around 1.5-1.6 -cr on presentation is 3.45 -No evidence of nephrolithiasis or hydronephrosis on Ct ab/pel -ua with rare bacteria, large leuk, urine culture <10,000 colinies, already on abx -renal dosing meds. Hold home meds losartan/hctz. -cr improving, today is close to baseline  CAD with stent in RCA and h/o CVA, h/o PVD: On betablocker (with holding parameters)/plavix/statin  Diastolic chf No edema on exam Decrease ivf from 125cc/hr to 75cc/hr, d/c ivf on 10/6 Close monitor volume status  COPD/Cigarette smoking: No wheezing, no cough, no hypoxia Stable, smoking cessation education provided   Hypothyroidism On  synthroid  Gout Stable on allopurinol   Body mass index is 39.83 kg/m.  Code Status: full  Family Communication: patient and family on 10/4  Disposition Plan: not ready to discharge   Consultants:  General surgery  IR  Procedures:  none  Antibiotics:  As above   Objective: BP 114/61 (BP Location: Left Arm)   Pulse 71   Temp 99 F (37.2 C) (Oral)   Resp 18   Ht 5' (1.524 m)   Wt 92.5 kg   SpO2 100%   BMI 39.83 kg/m   Intake/Output Summary (Last 24 hours) at 04/09/2018 0840 Last data filed at 04/09/2018 0300 Gross per 24 hour  Intake 962 ml  Output 400 ml  Net 562 ml   Filed Weights   04/09/18 0314  Weight: 92.5 kg    Exam: Patient is examined daily including today on 04/09/2018, exams remain the same as of yesterday except that has changed    General:  NAD  Cardiovascular: RRR  Respiratory: mild bibasilar crackles, no wheezing, no rhonchi  Abdomen: lower abdominal tender with mild guarding, no rebound, positive BS  Musculoskeletal: No Edema  Neuro: alert, oriented   Data Reviewed: Basic Metabolic Panel: Recent Labs  Lab 04/05/18 1301 04/06/18 0059 04/07/18 0537 04/08/18 0419 04/09/18 0603  NA 135 134* 137 138 137  K 3.4* 3.4* 4.1 4.0 4.1  CL 100 103 107 107 107  CO2 18* 18* 19* 20* 21*  GLUCOSE 101* 88 116* 99 93  BUN 45* 49* 28* 16 13  CREATININE 3.45* 2.97* 1.97* 1.59* 1.43*  CALCIUM 9.5 8.5* 8.8* 8.7* 8.3*  MG  --   --   --   --  1.6*   Liver  Function Tests: Recent Labs  Lab 04/05/18 1301 04/07/18 0537  AST 41 33  ALT 26 20  ALKPHOS 138* 105  BILITOT 1.3* 0.8  PROT 8.1 6.7  ALBUMIN 3.2* 2.4*   Recent Labs  Lab 04/05/18 1301  LIPASE 29   No results for input(s): AMMONIA in the last 168 hours. CBC: Recent Labs  Lab 04/05/18 1301 04/06/18 0059 04/07/18 0537 04/08/18 0419 04/09/18 0603  WBC 10.9* 9.7 9.3 10.3 7.7  NEUTROABS  --   --  7.5  --  5.5  HGB 12.1 10.2* 10.2* 10.3* 9.9*  HCT 36.1 30.1* 30.4*  30.2* 29.4*  MCV 84.1 82.5 82.6 83.0 83.1  PLT 336 286 290 292 290   Cardiac Enzymes:   No results for input(s): CKTOTAL, CKMB, CKMBINDEX, TROPONINI in the last 168 hours. BNP (last 3 results) No results for input(s): BNP in the last 8760 hours.  ProBNP (last 3 results) No results for input(s): PROBNP in the last 8760 hours.  CBG: No results for input(s): GLUCAP in the last 168 hours.  Recent Results (from the past 240 hour(s))  Culture, Urine     Status: Abnormal   Collection Time: 04/05/18  8:02 PM  Result Value Ref Range Status   Specimen Description URINE, RANDOM  Final   Special Requests NONE  Final   Culture (A)  Final    <10,000 COLONIES/mL INSIGNIFICANT GROWTH Performed at [facility], [address], [place], [place] [address]    Report Status 04/07/2018 FINAL  Final     Studies: No results found.  Scheduled Meds: . allopurinol  100 mg Oral Daily  . atorvastatin  80 mg Oral q1800  . gabapentin  100 mg Oral QHS  . levothyroxine  50 mcg Oral QAC breakfast  . metoprolol tartrate  12.5 mg Oral BID  . ondansetron (ZOFRAN) IV  4 mg Intravenous Once  . oxybutynin  5 mg Oral BID  . pantoprazole  40 mg Oral BID    Continuous Infusions: . ceFEPime (MAXIPIME) IV Stopped (04/08/18 2056)  . magnesium sulfate 1 - 4 g bolus IVPB    . metronidazole 500 mg (04/09/18 0542)     Time spent: 17mins I have personally reviewed and interpreted on  04/09/2018 daily labs, imagings as discussed above under date review session and assessment and plans.  I reviewed all nursing notes, pharmacy notes, consultant notes,  vitals, pertinent old records  I have discussed plan of care as described above with RN , patient  on 04/09/2018   Florencia Reasons MD, PhD  [facility] Pager [phone]. If 7PM-7AM, please contact night-coverage at [email or web address], password [facility] 04/09/2018, 8:40 AM  LOS: 4 days

## 2018-04-09 NOTE — Care Management Important Message (Signed)
Important Message  Patient Details  Name: Joan Elliott MRN: [id number] Date of Birth: 1941-04-27   Medicare Important Message Given:  Yes    Orbie Pyo 04/09/2018, 4:22 PM

## 2018-04-09 NOTE — Evaluation (Signed)
Physical Therapy Evaluation Patient Details Name: Joan Elliott MRN: [id number] DOB: 06-May-1941 Today's Date: 04/09/2018   History of Present Illness  Pt is a 77 y/o female admitted secondry to worsentin RLQ pain. Thought to have acute perforated appendicitis with abscess, however, a mass has not been ruled out. IR denied drain placement per notes. PMH includes CKD and HTN.    Clinical Impression  Pt admitted secondary to problem above with deficits below. Pt requiring min guard to supervision for mobility this session. Pt with notable fatigue and SOB, however, pt refusing to take rest break and oxygen sats WFL on RA. Will continue to follow acutely to maximize functional mobility independence and safety.     Follow Up Recommendations Home health PT;Supervision for mobility/OOB    Equipment Recommendations  3in1 (PT)    Recommendations for Other Services OT consult     Precautions / Restrictions Precautions Precautions: None Restrictions Weight Bearing Restrictions: No      Mobility  Bed Mobility               General bed mobility comments: Sitting up in recliner upon entry.   Transfers Overall transfer level: Needs assistance Equipment used: Rolling walker (2 wheeled) Transfers: Sit to/from Stand Sit to Stand: Supervision         General transfer comment: Supervision for safety. Increased time required.   Ambulation/Gait Ambulation/Gait assistance: Min guard Gait Distance (Feet): 200 Feet Assistive device: Rolling walker (2 wheeled) Gait Pattern/deviations: Step-through pattern;Decreased stride length Gait velocity: Decreased    General Gait Details: Slow, cautious gait. Cues for upright posture when using RW. Pt with notable fatigue and SOB, however, refusing to rest. Checked oxygen sats and sats at 96% on RA.   Stairs            Wheelchair Mobility    Modified Rankin (Stroke Patients Only)       Balance Overall balance assessment: Needs  assistance Sitting-balance support: No upper extremity supported;Feet supported Sitting balance-Leahy Scale: Good     Standing balance support: Bilateral upper extremity supported;During functional activity Standing balance-Leahy Scale: Poor Standing balance comment: Reliant on BUE support.                              Pertinent Vitals/Pain Pain Assessment: Faces Faces Pain Scale: Hurts a little bit Pain Location: lower R abdomen  Pain Descriptors / Indicators: Aching Pain Intervention(s): Limited activity within patient's tolerance;Monitored during session;Repositioned    Home Living Family/patient expects to be discharged to:: Private residence Living Arrangements: Alone Available Help at Discharge: Family;Available PRN/intermittently Type of Home: House Home Access: Stairs to enter Entrance Stairs-Rails: Psychiatric nurse of Steps: 2 Home Layout: One level Home Equipment: Walker - 2 wheels;Walker - 4 wheels      Prior Function Level of Independence: Independent with assistive device(s)         Comments: Reports she uses rollator or RW for ambulation outside of her house.      Hand Dominance        Extremity/Trunk Assessment   Upper Extremity Assessment Upper Extremity Assessment: Defer to OT evaluation    Lower Extremity Assessment Lower Extremity Assessment: Generalized weakness    Cervical / Trunk Assessment Cervical / Trunk Assessment: Normal  Communication   Communication: No difficulties  Cognition Arousal/Alertness: Awake/alert Behavior During Therapy: WFL for tasks assessed/performed Overall Cognitive Status: Within Functional Limits for tasks assessed  General Comments      Exercises     Assessment/Plan    PT Assessment Patient needs continued PT services  PT Problem List Decreased strength;Decreased mobility;Decreased knowledge of use of DME;Decreased  activity tolerance       PT Treatment Interventions DME instruction;Gait training;Stair training;Functional mobility training;Therapeutic activities;Therapeutic exercise;Balance training;Patient/family education    PT Goals (Current goals can be found in the Care Plan section)  Acute Rehab PT Goals Patient Stated Goal: to go home as soon as I can  PT Goal Formulation: With patient Time For Goal Achievement: 04/23/18 Potential to Achieve Goals: Good    Frequency Min 3X/week   Barriers to discharge Decreased caregiver support      Co-evaluation               AM-PAC PT "6 Clicks" Daily Activity  Outcome Measure Difficulty turning over in bed (including adjusting bedclothes, sheets and blankets)?: A Little Difficulty moving from lying on back to sitting on the side of the bed? : A Little Difficulty sitting down on and standing up from a chair with arms (e.g., wheelchair, bedside commode, etc,.)?: Unable Help needed moving to and from a bed to chair (including a wheelchair)?: A Little Help needed walking in hospital room?: A Little Help needed climbing 3-5 steps with a railing? : A Lot 6 Click Score: 15    End of Session   Activity Tolerance: Patient limited by fatigue Patient left: in bed;with call bell/phone within reach(sitting EOB ) Nurse Communication: Mobility status PT Visit Diagnosis: Muscle weakness (generalized) (M62.81);Other abnormalities of gait and mobility (R26.89)    Time: 9093-1121 PT Time Calculation (min) (ACUTE ONLY): 17 min   Charges:   PT Evaluation $PT Eval Low Complexity: Rosalia, PT, DPT  Acute Rehabilitation Services  Pager: [phone] Office: [phone]   [name] 04/09/2018, 3:47 PM

## 2018-04-09 NOTE — Progress Notes (Signed)
PT Cancellation Note  Patient Details Name: Joan Elliott MRN: [id number] DOB: [birth year]   Cancelled Treatment:    Reason Eval/Treat Not Completed: Other (comment) Pt just getting back from walk with mobility tech and requesting for PT to come back later. Will follow up as schedule allows.   Leighton Ruff, PT, DPT  Acute Rehabilitation Services  Pager: [phone] Office: [phone]    [name] 04/09/2018, 10:50 AM

## 2018-04-10 ENCOUNTER — Inpatient Hospital Stay (HOSPITAL_COMMUNITY): Payer: Medicare HMO

## 2018-04-10 LAB — BASIC METABOLIC PANEL
Anion gap: 7 (ref 5–15)
BUN: 14 mg/dL (ref 8–23)
CHLORIDE: 107 mmol/L (ref 98–111)
CO2: 23 mmol/L (ref 22–32)
CREATININE: 1.36 mg/dL — AB (ref 0.44–1.00)
Calcium: 8.5 mg/dL — ABNORMAL LOW (ref 8.9–10.3)
GFR calc non Af Amer: 36 mL/min — ABNORMAL LOW (ref >60)
GFR, EST AFRICAN AMERICAN: 42 mL/min — AB (ref >60)
Glucose, Bld: 103 mg/dL — ABNORMAL HIGH (ref 70–99)
Potassium: 4 mmol/L (ref 3.5–5.1)
Sodium: 137 mmol/L (ref 135–145)

## 2018-04-10 LAB — CBC WITH DIFFERENTIAL/PLATELET
Abs Immature Granulocytes: 0 10*3/uL (ref 0.00–0.07)
Basophils Absolute: 0 10*3/uL (ref 0.0–0.1)
Basophils Relative: 1 %
EOS PCT: 2 %
Eosinophils Absolute: 0.1 10*3/uL (ref 0.0–0.5)
HCT: 31 % — ABNORMAL LOW (ref 36.0–46.0)
HEMOGLOBIN: 10.3 g/dL — AB (ref 12.0–15.0)
Immature Granulocytes: 0 %
LYMPHS ABS: 1.6 10*3/uL (ref 0.7–4.0)
LYMPHS PCT: 23 %
MCH: 27.7 pg (ref 26.0–34.0)
MCHC: 33.2 g/dL (ref 30.0–36.0)
MCV: 83.3 fL (ref 80.0–100.0)
MONO ABS: 0.7 10*3/uL (ref 0.1–1.0)
Monocytes Relative: 11 %
Neutro Abs: 4.3 10*3/uL (ref 1.7–7.7)
Neutrophils Relative %: 63 %
Platelets: 278 10*3/uL (ref 150–400)
RBC: 3.72 MIL/uL — ABNORMAL LOW (ref 3.87–5.11)
RDW: 14.3 % (ref 11.5–15.5)
WBC: 6.8 10*3/uL (ref 4.0–10.5)

## 2018-04-10 LAB — MAGNESIUM: Magnesium: 1.9 mg/dL (ref 1.7–2.4)

## 2018-04-10 NOTE — Progress Notes (Signed)
[facility]uma Progress Note      Assessment/Plan Perforated appendicitis with abscess - cannot rule out mass - afebrile, VSS, leukocytosis resolved - IR denied drain placement 10/4, states it looks more phlegmonous; Repeat CT  - Continue broad spectrum antibiotics. - mobilize  ID -cefepime/flagyl 10/3>>(DAY#4) FEN -IVF,fulls VTE -SCDs Foley: none Follow up: TBD  DISPO: repeat CT scan today     LOS: 5 days    Subjective: CC: RLQ abdominal pain  Pt states pain is worse today than yesterday. No nausea, vomiting, fever, chills overnight. She is having bowel function.   Objective: Vital signs in last 24 hours: Temp:  [98.1 F (36.7 C)-98.7 F (37.1 C)] 98.2 F (36.8 C) (10/08 0446) Pulse Rate:  [71-75] 75 (10/08 0446) Resp:  [17-18] 18 (10/08 0446) BP: (101-118)/(53-68) 118/68 (10/08 0446) SpO2:  [97 %-100 %] 99 % (10/08 0446) Last BM Date: 04/09/18  Intake/Output from previous day: 10/07 0701 - 10/08 0700 In: 962 [P.O.:862; IV Piggyback:100] Out: 300 [Urine:300] Intake/Output this shift: No intake/output data recorded.  PE: Gen:  Alert, NAD, pleasant, cooperative Pulm:  Rate and effort normal Abd: Soft, obese, ND, +BS, generalized TTP worse in the RLQ, + guarding Skin: no rashes noted, warm and dry   Anti-infectives: Anti-infectives (From admission, onward)   Start     Dose/Rate Route Frequency Ordered Stop   04/06/18 2000  ceFEPIme (MAXIPIME) 1 g in sodium chloride 0.9 % 100 mL IVPB     1 g 200 mL/hr over 30 Minutes Intravenous Every 24 hours 04/05/18 2005     04/06/18 0600  metroNIDAZOLE (FLAGYL) IVPB 500 mg     500 mg 100 mL/hr over 60 Minutes Intravenous Every 8 hours 04/05/18 1957     04/05/18 1930  ceFEPIme (MAXIPIME) 2 g in sodium chloride 0.9 % 100 mL IVPB     2 g 200 mL/hr over 30 Minutes Intravenous  Once 04/05/18 1921 04/06/18 0104   04/05/18 1930  metroNIDAZOLE (FLAGYL) IVPB 500 mg     500 mg 100 mL/hr over 60  Minutes Intravenous  Once 04/05/18 1921 04/05/18 2236      Lab Results:  Recent Labs    04/09/18 0603 04/10/18 0521  WBC 7.7 6.8  HGB 9.9* 10.3*  HCT 29.4* 31.0*  PLT 290 278   BMET Recent Labs    04/09/18 0603 04/10/18 0521  NA 137 137  K 4.1 4.0  CL 107 107  CO2 21* 23  GLUCOSE 93 103*  BUN 13 14  CREATININE 1.43* 1.36*  CALCIUM 8.3* 8.5*   PT/INR No results for input(s): LABPROT, INR in the last 72 hours. CMP     Component Value Date/Time   NA 137 04/10/2018 0521   K 4.0 04/10/2018 0521   CL 107 04/10/2018 0521   CO2 23 04/10/2018 0521   GLUCOSE 103 (H) 04/10/2018 0521   BUN 14 04/10/2018 0521   CREATININE 1.36 (H) 04/10/2018 0521   CALCIUM 8.5 (L) 04/10/2018 0521   PROT 6.7 04/07/2018 0537   ALBUMIN 2.4 (L) 04/07/2018 0537   AST 33 04/07/2018 0537   ALT 20 04/07/2018 0537   ALKPHOS 105 04/07/2018 0537   BILITOT 0.8 04/07/2018 0537   GFRNONAA 36 (L) 04/10/2018 0521   GFRAA 42 (L) 04/10/2018 0521   Lipase     Component Value Date/Time   LIPASE 29 04/05/2018 1301    Studies/Results: No results found.    Kalman Drape , [facility] Surgery 04/10/2018, 8:23 AM  Pager: [phone] Mon-Wed, Friday 7:00am-4:30pm Thurs 7am-11:30am  Consults: [phone]

## 2018-04-10 NOTE — Plan of Care (Signed)
  Problem: Education: Goal: Knowledge of General Education information will improve Description Including pain rating scale, medication(s)/side effects and non-pharmacologic comfort measures Outcome: Progressing   Problem: Health Behavior/Discharge Planning: Goal: Ability to manage health-related needs will improve Outcome: Progressing   Problem: Clinical Measurements: Goal: Ability to maintain clinical measurements within normal limits will improve Outcome: Progressing Goal: Will remain free from infection Outcome: Progressing Goal: Diagnostic test results will improve Outcome: Progressing Goal: Respiratory complications will improve Outcome: Progressing Goal: Cardiovascular complication will be avoided Outcome: Progressing   Problem: Activity: Goal: Risk for activity intolerance will decrease Outcome: Progressing   Problem: Nutrition: Goal: Adequate nutrition will be maintained Outcome: Progressing   Problem: Coping: Goal: Level of anxiety will decrease Outcome: Progressing   Problem: Elimination: Goal: Will not experience complications related to bowel motility Outcome: Progressing Goal: Will not experience complications related to urinary retention Outcome: Progressing   Problem: Pain Managment: Goal: General experience of comfort will improve Outcome: Progressing   Problem: Safety: Goal: Ability to remain free from injury will improve Outcome: Progressing   Problem: Skin Integrity: Goal: Risk for impaired skin integrity will decrease Outcome: Progressing  Forestine Chute, RN 04/10/2018

## 2018-04-10 NOTE — Progress Notes (Signed)
PROGRESS NOTE  Joan Elliott VHQ:[id number] DOB: Dec 10, 1940 DOA: 04/05/2018 PCP: [name], MD  Brief Summary:  Joan Elliott is a 77 y.o. female with medical history significant of CKD stage 3, HTN, stroke, CAD.  Patient presents to the ED with ~7 day history of worsening RLQ abd pain.  Associated anorexia and poor PO intake    HPI/Recap of past 24 hours:  Continue to have  right lower abdominal pain,  No n/v, having bm, no hematochezia  no fever,    Assessment/Plan: Principal Problem:   Acute perforated appendicitis Active Problems:   CKD (chronic kidney disease) stage 3, GFR 30-59 ml/min (HCC)   AKI (acute kidney injury) ([place])   HTN (hypertension)    Right lower quadrant intra-abdominal abscess likely consistent with perforated appendicitis.  Cannot rule out mass -on cefepime/flagyl -IR consulted initially on presentation, nothing to drain at the time  -continue to have right lower quadrant pain, repeat ct today -general surgery following, will follow recommendation  Acute anemia: Likely from gi blood loss in the setting of perforated appendicitis hgb 06-12-09-9.9-10.3, monitor Currently on scd for DVT prophylaxis, avoid prophylaxis lovenox due to possible needing inervention  Hypokalemia/hypomagnesemia: k 3.4, mag 1.6 on presentation Replaced,  k 4 and mag 1.9 today, monitor Keep k>4, mag>2   AKI on CKDIII-IV -cr baseline around 1.5-1.6 -cr on presentation is 3.45 -No evidence of nephrolithiasis or hydronephrosis on Ct ab/pel -ua with rare bacteria, large leuk, urine culture <10,000 colinies, already on abx -renal dosing meds. Hold home meds losartan/hctz. -cr improving, today is close to baseline at 1.36  CAD with stent in RCA and h/o CVA, h/o PVD: On betablocker (with holding parameters)/plavix/statin  Diastolic chf No edema on exam She ws on ivf 125cc/hr then 75cc/hr,  ivf  D/ed on 10/6 Close monitor volume status  COPD/Cigarette  smoking: No wheezing, no cough, no hypoxia Stable, smoking cessation education provided   Hypothyroidism On synthroid  Gout Stable on allopurinol   Body mass index is 39.83 kg/m.  Code Status: full  Family Communication: patient and family on 10/4  Disposition Plan: not ready to discharge   Consultants:  General surgery  IR  Procedures:  none  Antibiotics:  As above   Objective: BP 118/68 (BP Location: Left Arm)   Pulse 75   Temp 98.2 F (36.8 C) (Oral)   Resp 18   Ht 5' (1.524 m)   Wt 92.5 kg   SpO2 99%   BMI 39.83 kg/m   Intake/Output Summary (Last 24 hours) at 04/10/2018 1219 Last data filed at 04/10/2018 0444 Gross per 24 hour  Intake 500 ml  Output 300 ml  Net 200 ml   Filed Weights   04/09/18 0314  Weight: 92.5 kg    Exam: Patient is examined daily including today on 04/10/2018, exams remain the same as of yesterday except that has changed    General:  NAD  Cardiovascular: RRR  Respiratory: mild bibasilar crackles has resolved today, no wheezing, no rhonchi  Abdomen: lower abdominal tender with mild guarding, no rebound, positive BS  Musculoskeletal: No Edema  Neuro: alert, oriented   Data Reviewed: Basic Metabolic Panel: Recent Labs  Lab 04/06/18 0059 04/07/18 0537 04/08/18 0419 04/09/18 0603 04/10/18 0521  NA 134* 137 138 137 137  K 3.4* 4.1 4.0 4.1 4.0  CL 103 107 107 107 107  CO2 18* 19* 20* 21* 23  GLUCOSE 88 116* 99 93 103*  BUN 49* 28* 16 13 14  CREATININE 2.97* 1.97* 1.59* 1.43* 1.36*  CALCIUM 8.5* 8.8* 8.7* 8.3* 8.5*  MG  --   --   --  1.6* 1.9   Liver Function Tests: Recent Labs  Lab 04/05/18 1301 04/07/18 0537  AST 41 33  ALT 26 20  ALKPHOS 138* 105  BILITOT 1.3* 0.8  PROT 8.1 6.7  ALBUMIN 3.2* 2.4*   Recent Labs  Lab 04/05/18 1301  LIPASE 29   No results for input(s): AMMONIA in the last 168 hours. CBC: Recent Labs  Lab 04/06/18 0059 04/07/18 0537 04/08/18 0419 04/09/18 0603  04/10/18 0521  WBC 9.7 9.3 10.3 7.7 6.8  NEUTROABS  --  7.5  --  5.5 4.3  HGB 10.2* 10.2* 10.3* 9.9* 10.3*  HCT 30.1* 30.4* 30.2* 29.4* 31.0*  MCV 82.5 82.6 83.0 83.1 83.3  PLT 286 290 292 290 278   Cardiac Enzymes:   No results for input(s): CKTOTAL, CKMB, CKMBINDEX, TROPONINI in the last 168 hours. BNP (last 3 results) No results for input(s): BNP in the last 8760 hours.  ProBNP (last 3 results) No results for input(s): PROBNP in the last 8760 hours.  CBG: No results for input(s): GLUCAP in the last 168 hours.  Recent Results (from the past 240 hour(s))  Culture, Urine     Status: Abnormal   Collection Time: 04/05/18  8:02 PM  Result Value Ref Range Status   Specimen Description URINE, RANDOM  Final   Special Requests NONE  Final   Culture (A)  Final    <10,000 COLONIES/mL INSIGNIFICANT GROWTH Performed at [facility], [address] [place], [place] [address]    Report Status 04/07/2018 FINAL  Final     Studies: No results found.  Scheduled Meds: . allopurinol  100 mg Oral Daily  . atorvastatin  80 mg Oral q1800  . feeding supplement (ENSURE ENLIVE)  237 mL Oral BID BM  . gabapentin  100 mg Oral QHS  . levothyroxine  50 mcg Oral QAC breakfast  . metoprolol tartrate  12.5 mg Oral BID  . multivitamin with minerals  1 tablet Oral Daily  . ondansetron (ZOFRAN) IV  4 mg Intravenous Once  . oxybutynin  5 mg Oral BID  . pantoprazole  40 mg Oral BID    Continuous Infusions: . ceFEPime (MAXIPIME) IV 1 g (04/09/18 2019)  . metronidazole 500 mg (04/10/18 0529)     Time spent: 76mins I have personally reviewed and interpreted on  04/10/2018 daily labs, imagings as discussed above under date review session and assessment and plans.  I reviewed all nursing notes, pharmacy notes, consultant notes,  vitals, pertinent old records  I have discussed plan of care as described above with RN , patient  on 04/10/2018   Florencia Reasons MD, PhD  [facility] Pager  [phone]. If 7PM-7AM, please contact night-coverage at [email or web address], password [facility] 04/10/2018, 12:19 PM  LOS: 5 days

## 2018-04-10 NOTE — Progress Notes (Signed)
Patient ID: Joan Elliott, female   DOB: Jul 09, 1940, 77 y.o.   MRN: [id number] Ct without any real change, still tender but better.  Nothing to drain really. I think if not significantly improved in am will need to consider surgery

## 2018-04-11 ENCOUNTER — Encounter (HOSPITAL_COMMUNITY): Payer: Self-pay | Admitting: Surgery

## 2018-04-11 ENCOUNTER — Inpatient Hospital Stay (HOSPITAL_COMMUNITY): Payer: Medicare HMO | Admitting: Certified Registered Nurse Anesthetist

## 2018-04-11 ENCOUNTER — Encounter (HOSPITAL_COMMUNITY)
Admission: EM | Disposition: A | Discharge status: Home Health | Payer: Self-pay | Source: Home / Self Care | Attending: Internal Medicine

## 2018-04-11 ENCOUNTER — Inpatient Hospital Stay (HOSPITAL_COMMUNITY): Payer: Medicare HMO

## 2018-04-11 HISTORY — PX: PARTIAL COLECTOMY: SHX5273

## 2018-04-11 HISTORY — PX: LAPAROTOMY: SHX154

## 2018-04-11 HISTORY — PX: LYSIS OF ADHESION: SHX5961

## 2018-04-11 HISTORY — PX: LAPAROSCOPY: SHX197

## 2018-04-11 LAB — ABO/RH: ABO/RH(D): B POS

## 2018-04-11 LAB — CBC
HEMATOCRIT: 30.1 % — AB (ref 36.0–46.0)
Hemoglobin: 9.7 g/dL — ABNORMAL LOW (ref 12.0–15.0)
MCH: 26.5 pg (ref 26.0–34.0)
MCHC: 32.2 g/dL (ref 30.0–36.0)
MCV: 82.2 fL (ref 80.0–100.0)
PLATELETS: 350 10*3/uL (ref 150–400)
RBC: 3.66 MIL/uL — ABNORMAL LOW (ref 3.87–5.11)
RDW: 14.3 % (ref 11.5–15.5)
WBC: 6.7 10*3/uL (ref 4.0–10.5)
nRBC: 0 % (ref 0.0–0.2)

## 2018-04-11 LAB — SURGICAL PCR SCREEN
MRSA, PCR: NEGATIVE
Staphylococcus aureus: NEGATIVE

## 2018-04-11 LAB — BASIC METABOLIC PANEL
Anion gap: 9 (ref 5–15)
BUN: 10 mg/dL (ref 8–23)
CALCIUM: 8.5 mg/dL — AB (ref 8.9–10.3)
CO2: 23 mmol/L (ref 22–32)
CREATININE: 1.27 mg/dL — AB (ref 0.44–1.00)
Chloride: 104 mmol/L (ref 98–111)
GFR, EST AFRICAN AMERICAN: 45 mL/min — AB (ref >60)
GFR, EST NON AFRICAN AMERICAN: 39 mL/min — AB (ref >60)
GLUCOSE: 92 mg/dL (ref 70–99)
Potassium: 4.1 mmol/L (ref 3.5–5.1)
Sodium: 136 mmol/L (ref 135–145)

## 2018-04-11 LAB — TYPE AND SCREEN
ABO/RH(D): B POS
ANTIBODY SCREEN: NEGATIVE

## 2018-04-11 LAB — MAGNESIUM: Magnesium: 1.8 mg/dL (ref 1.7–2.4)

## 2018-04-11 SURGERY — LAPAROSCOPY, DIAGNOSTIC
Anesthesia: General | Site: Abdomen

## 2018-04-11 MED ORDER — ACETAMINOPHEN 10 MG/ML IV SOLN
1000.0000 mg | Freq: Four times a day (QID) | INTRAVENOUS | Status: DC
  Administered 2018-04-11 – 2018-04-12 (×3): 1000 mg via INTRAVENOUS
  Filled 2018-04-11 (×3): qty 100

## 2018-04-11 MED ORDER — FENTANYL CITRATE (PF) 100 MCG/2ML IJ SOLN
INTRAMUSCULAR | Status: AC
  Filled 2018-04-11: qty 2

## 2018-04-11 MED ORDER — PROPOFOL 10 MG/ML IV BOLUS
INTRAVENOUS | Status: AC
  Filled 2018-04-11: qty 20

## 2018-04-11 MED ORDER — MEPERIDINE HCL 50 MG/ML IJ SOLN: 6.2500 mg | INTRAMUSCULAR | Status: DC | PRN

## 2018-04-11 MED ORDER — LACTATED RINGERS IV SOLN
INTRAVENOUS | Status: DC | PRN
  Administered 2018-04-11 (×2): via INTRAVENOUS

## 2018-04-11 MED ORDER — SUCCINYLCHOLINE CHLORIDE 200 MG/10ML IV SOSY
PREFILLED_SYRINGE | INTRAVENOUS | Status: DC | PRN
  Administered 2018-04-11: 120 mg via INTRAVENOUS

## 2018-04-11 MED ORDER — DEXAMETHASONE SODIUM PHOSPHATE 10 MG/ML IJ SOLN
INTRAMUSCULAR | Status: AC
  Filled 2018-04-11: qty 1

## 2018-04-11 MED ORDER — ROCURONIUM BROMIDE 50 MG/5ML IV SOSY
PREFILLED_SYRINGE | INTRAVENOUS | Status: DC | PRN
  Administered 2018-04-11: 40 mg via INTRAVENOUS
  Administered 2018-04-11 (×2): 10 mg via INTRAVENOUS

## 2018-04-11 MED ORDER — FENTANYL CITRATE (PF) 100 MCG/2ML IJ SOLN
INTRAMUSCULAR | Status: DC | PRN
  Administered 2018-04-11 (×10): 50 ug via INTRAVENOUS

## 2018-04-11 MED ORDER — LIDOCAINE 2% (20 MG/ML) 5 ML SYRINGE
INTRAMUSCULAR | Status: AC
  Filled 2018-04-11: qty 5

## 2018-04-11 MED ORDER — PHENYLEPHRINE 40 MCG/ML (10ML) SYRINGE FOR IV PUSH (FOR BLOOD PRESSURE SUPPORT)
PREFILLED_SYRINGE | INTRAVENOUS | Status: DC | PRN
  Administered 2018-04-11 (×2): 80 ug via INTRAVENOUS

## 2018-04-11 MED ORDER — DEXAMETHASONE SODIUM PHOSPHATE 10 MG/ML IJ SOLN
INTRAMUSCULAR | Status: DC | PRN
  Administered 2018-04-11: 10 mg via INTRAVENOUS

## 2018-04-11 MED ORDER — PROMETHAZINE HCL 25 MG/ML IJ SOLN: 6.2500 mg | INTRAMUSCULAR | Status: DC | PRN

## 2018-04-11 MED ORDER — PROPOFOL 10 MG/ML IV BOLUS
INTRAVENOUS | Status: DC | PRN
  Administered 2018-04-11: 100 mg via INTRAVENOUS

## 2018-04-11 MED ORDER — CHLORHEXIDINE GLUCONATE CLOTH 2 % EX PADS
6.0000 | MEDICATED_PAD | Freq: Every day | CUTANEOUS | Status: DC
  Administered 2018-04-12 – 2018-04-16 (×5): 6 via TOPICAL

## 2018-04-11 MED ORDER — SODIUM CHLORIDE 0.9% FLUSH
10.0000 mL | Freq: Two times a day (BID) | INTRAVENOUS | Status: DC
  Administered 2018-04-11: 20 mL
  Administered 2018-04-12: 10 mL
  Administered 2018-04-12: 20 mL
  Administered 2018-04-14 – 2018-04-23 (×5): 10 mL

## 2018-04-11 MED ORDER — ACETAMINOPHEN 10 MG/ML IV SOLN
INTRAVENOUS | Status: AC
  Filled 2018-04-11: qty 100

## 2018-04-11 MED ORDER — FENTANYL CITRATE (PF) 100 MCG/2ML IJ SOLN
25.0000 ug | INTRAMUSCULAR | Status: DC | PRN
  Administered 2018-04-11: 50 ug via INTRAVENOUS
  Administered 2018-04-11 (×2): 25 ug via INTRAVENOUS
  Administered 2018-04-11: 50 ug via INTRAVENOUS

## 2018-04-11 MED ORDER — FENTANYL CITRATE (PF) 250 MCG/5ML IJ SOLN
INTRAMUSCULAR | Status: AC
  Filled 2018-04-11: qty 5

## 2018-04-11 MED ORDER — BUPIVACAINE-EPINEPHRINE 0.25% -1:200000 IJ SOLN
INTRAMUSCULAR | Status: DC | PRN
  Administered 2018-04-11: 10 mL

## 2018-04-11 MED ORDER — BUPIVACAINE-EPINEPHRINE (PF) 0.25% -1:200000 IJ SOLN
INTRAMUSCULAR | Status: AC
  Filled 2018-04-11: qty 30

## 2018-04-11 MED ORDER — SODIUM CHLORIDE 0.9% FLUSH
10.0000 mL | INTRAVENOUS | Status: DC | PRN
  Administered 2018-04-12 – 2018-04-23 (×5): 10 mL
  Filled 2018-04-11 (×5): qty 40

## 2018-04-11 MED ORDER — LACTATED RINGERS IV SOLN: INTRAVENOUS | Status: DC

## 2018-04-11 MED ORDER — ALBUMIN HUMAN 5 % IV SOLN
INTRAVENOUS | Status: DC | PRN
  Administered 2018-04-11: 16:00:00 via INTRAVENOUS

## 2018-04-11 MED ORDER — SUGAMMADEX SODIUM 200 MG/2ML IV SOLN
INTRAVENOUS | Status: DC | PRN
  Administered 2018-04-11: 200 mg via INTRAVENOUS

## 2018-04-11 MED ORDER — ONDANSETRON HCL 4 MG/2ML IJ SOLN
INTRAMUSCULAR | Status: DC | PRN
  Administered 2018-04-11: 4 mg via INTRAVENOUS

## 2018-04-11 MED ORDER — 0.9 % SODIUM CHLORIDE (POUR BTL) OPTIME
TOPICAL | Status: DC | PRN
  Administered 2018-04-11 (×3): 1000 mL

## 2018-04-11 MED ORDER — ROCURONIUM BROMIDE 50 MG/5ML IV SOSY
PREFILLED_SYRINGE | INTRAVENOUS | Status: AC
  Filled 2018-04-11: qty 5

## 2018-04-11 MED ORDER — ONDANSETRON HCL 4 MG/2ML IJ SOLN
INTRAMUSCULAR | Status: AC
  Filled 2018-04-11: qty 2

## 2018-04-11 SURGICAL SUPPLY — 66 items
APPLIER CLIP 5 13 M/L LIGAMAX5 (MISCELLANEOUS)
BIOPATCH RED 1 DISK 7.0 (GAUZE/BANDAGES/DRESSINGS) ×2 IMPLANT
BIOPATCH RED 1IN DISK 7.0MM (GAUZE/BANDAGES/DRESSINGS) ×1
CANISTER SUCT 3000ML PPV (MISCELLANEOUS) ×3 IMPLANT
CHLORAPREP W/TINT 26ML (MISCELLANEOUS) ×6 IMPLANT
CLIP APPLIE 5 13 M/L LIGAMAX5 (MISCELLANEOUS) IMPLANT
COVER SURGICAL LIGHT HANDLE (MISCELLANEOUS) ×3 IMPLANT
COVER WAND RF STERILE (DRAPES) ×3 IMPLANT
DERMABOND ADVANCED (GAUZE/BANDAGES/DRESSINGS) ×2
DERMABOND ADVANCED .7 DNX12 (GAUZE/BANDAGES/DRESSINGS) ×1 IMPLANT
DRAIN CHANNEL 19F RND (DRAIN) ×3 IMPLANT
DRAPE LAPAROSCOPIC ABDOMINAL (DRAPES) ×3 IMPLANT
DRAPE WARM FLUID 44X44 (DRAPE) ×3 IMPLANT
DRSG OPSITE POSTOP 4X10 (GAUZE/BANDAGES/DRESSINGS) ×3 IMPLANT
DRSG OPSITE POSTOP 4X8 (GAUZE/BANDAGES/DRESSINGS) IMPLANT
DRSG TEGADERM 4X4.75 (GAUZE/BANDAGES/DRESSINGS) ×3 IMPLANT
DRSG VAC ATS MED SENSATRAC (GAUZE/BANDAGES/DRESSINGS) ×3 IMPLANT
ELECT REM PT RETURN 9FT ADLT (ELECTROSURGICAL) ×3
ELECTRODE REM PT RTRN 9FT ADLT (ELECTROSURGICAL) ×1 IMPLANT
EVACUATOR SILICONE 100CC (DRAIN) ×3 IMPLANT
GLOVE BIO SURGEON STRL SZ7 (GLOVE) ×6 IMPLANT
GLOVE BIOGEL PI IND STRL 7.5 (GLOVE) ×2 IMPLANT
GLOVE BIOGEL PI INDICATOR 7.5 (GLOVE) ×4
GOWN STRL REUS W/ TWL LRG LVL3 (GOWN DISPOSABLE) ×6 IMPLANT
GOWN STRL REUS W/TWL LRG LVL3 (GOWN DISPOSABLE) ×12
HANDLE SUCTION POOLE (INSTRUMENTS) ×1 IMPLANT
KIT BASIN OR (CUSTOM PROCEDURE TRAY) ×3 IMPLANT
KIT TURNOVER KIT B (KITS) ×3 IMPLANT
LIGASURE IMPACT 36 18CM CVD LR (INSTRUMENTS) ×3 IMPLANT
NS IRRIG 1000ML POUR BTL (IV SOLUTION) ×6 IMPLANT
PACK COLON (CUSTOM PROCEDURE TRAY) ×3 IMPLANT
PAD ARMBOARD 7.5X6 YLW CONV (MISCELLANEOUS) ×3 IMPLANT
PENCIL BUTTON HOLSTER BLD 10FT (ELECTRODE) ×3 IMPLANT
PENCIL SMOKE EVACUATOR COATED (MISCELLANEOUS) ×3 IMPLANT
RELOAD PROXIMATE 75MM BLUE (ENDOMECHANICALS) ×6 IMPLANT
SCISSORS LAP 5X35 DISP (ENDOMECHANICALS) IMPLANT
SET IRRIG TUBING LAPAROSCOPIC (IRRIGATION / IRRIGATOR) IMPLANT
SLEEVE ENDOPATH XCEL 5M (ENDOMECHANICALS) ×9 IMPLANT
SPONGE LAP 18X18 RF (DISPOSABLE) ×3 IMPLANT
SPONGE LAP 18X18 X RAY DECT (DISPOSABLE) IMPLANT
STAPLER GUN LINEAR PROX 60 (STAPLE) ×3 IMPLANT
STAPLER PROXIMATE 75MM BLUE (STAPLE) ×3 IMPLANT
STAPLER VISISTAT 35W (STAPLE) ×3 IMPLANT
SUCTION POOLE HANDLE (INSTRUMENTS) ×3
SURGILUBE 2OZ TUBE FLIPTOP (MISCELLANEOUS) IMPLANT
SUT ETHILON 2 0 FS 18 (SUTURE) ×3 IMPLANT
SUT PDS AB 1 TP1 96 (SUTURE) ×9 IMPLANT
SUT PROLENE 2 0 CT2 30 (SUTURE) IMPLANT
SUT PROLENE 2 0 KS (SUTURE) IMPLANT
SUT SILK 2 0 SH CR/8 (SUTURE) ×3 IMPLANT
SUT SILK 2 0 TIES 10X30 (SUTURE) ×3 IMPLANT
SUT SILK 3 0 SH CR/8 (SUTURE) ×9 IMPLANT
SUT SILK 3 0 TIES 10X30 (SUTURE) ×3 IMPLANT
SUT VIC AB 3-0 SH 18 (SUTURE) IMPLANT
SYR BULB IRRIGATION 50ML (SYRINGE) ×3 IMPLANT
TOWEL OR 17X24 6PK STRL BLUE (TOWEL DISPOSABLE) ×3 IMPLANT
TOWEL OR 17X26 10 PK STRL BLUE (TOWEL DISPOSABLE) ×3 IMPLANT
TRAY FOLEY MTR SLVR 14FR STAT (SET/KITS/TRAYS/PACK) IMPLANT
TRAY LAPAROSCOPIC MC (CUSTOM PROCEDURE TRAY) ×3 IMPLANT
TRAY PROCTOSCOPIC FIBER OPTIC (SET/KITS/TRAYS/PACK) IMPLANT
TROCAR XCEL BLUNT TIP 100MML (ENDOMECHANICALS) IMPLANT
TROCAR XCEL NON-BLD 11X100MML (ENDOMECHANICALS) IMPLANT
TROCAR XCEL NON-BLD 5MMX100MML (ENDOMECHANICALS) ×3 IMPLANT
TUBE CONNECTING 12'X1/4 (SUCTIONS) ×2
TUBE CONNECTING 12X1/4 (SUCTIONS) ×4 IMPLANT
TUBING INSUFFLATION (TUBING) ×3 IMPLANT

## 2018-04-11 NOTE — Progress Notes (Signed)
Patient went to step down unit after surgery, belongings given to her daughter in law, including her jewerly and red robe.

## 2018-04-11 NOTE — Care Management Note (Signed)
Case Management Note  Patient Details  Name: Joan Elliott MRN: [id number] Date of Birth: 08/15/1940  Subjective/Objective:                 Perf appy, sx 10/9   Action/Plan:  Sx 10/9, from home alone, initial PT rec for [facility] prior to sx, will need reassessed post op. CM will continue to follow.   Expected Discharge Date:                  Expected Discharge Plan:  [place]  In-House Referral:     Discharge planning Services  CM Consult  Post Acute Care Choice:    Choice offered to:     DME Arranged:    DME Agency:     HH Arranged:    [place] Agency:     Status of Service:  In process, will continue to follow  If discussed at Long Length of Stay Meetings, dates discussed:    Additional Comments:  Carles Collet, RN 04/11/2018, 2:08 PM

## 2018-04-11 NOTE — Progress Notes (Signed)
Patient ID: Joan Elliott, female   DOB: 1941-06-10, 76 y.o.   MRN: [id number]  PROGRESS NOTE    KWANA RINGEL  JEH:[id number] DOB: 05/12/1941 DOA: 04/05/2018 PCP: [name], MD   Brief Narrative:  77 year old female with history of chronic kidney disease stage III, hypertension, stroke, CAD presented on 04/05/2018 with abdominal pain.  She was admitted with probable perforated appendicitis and probable underlying mass.  General surgery was consulted.  Patient was started on IV antibiotics.  IR was consulted but there was no abscess to be drained.   Assessment & Plan:   Principal Problem:   Acute perforated appendicitis Active Problems:   CKD (chronic kidney disease) stage 3, GFR 30-59 ml/min (HCC)   AKI (acute kidney injury) ([place])   HTN (hypertension)   Perforated appendicitis with abscess -Cannot rule out mass -IR was consulted on admission but there was no abscess to be drained -Currently on cefepime and Flagyl -Repeat CT on 04/10/2018 did not show any abscess -Still tender in the abdomen and complains of abdominal pain.  General surgery following and probably planning for surgical intervention today. -Continue pain management  Acute anemia -Probably from GI blood loss in the setting of perforated appendicitis -Hemoglobin stable.  Monitor  Acute kidney injury on chronic kidney disease stage III-IV -No evidence of nephrolithiasis or hydronephrosis on CT of the abdomen and pelvis.  Much improved.  Monitor. -Home losartan/hydrochlorothiazide on hold  CAD with stent in RCA and history of CVA, history of PVD -Continue beta-blocker and statin.  Plavix held probably for surgery  Chronic diastolic CHF -Currently compensated.  Monitor strict input and output.  Continue metoprolol.  Losartan/hydrochlorthiazide on hold.    COPD with tobacco abuse -Stable.  No wheezing or hypoxia  Hypothyroidism On Synthroid  Gout -Stable on allopurinol  Obesity -Outpatient  follow-up  DVT prophylaxis: SCDs.  Not on Lovenox for probable surgical intervention Code Status: Full Family Communication: None at bedside Disposition Plan: Depends on clinical outcome  Consultants: General surgery/IR  Procedures: None  Antimicrobials: Cefepime and Flagyl from 04/05/2018 onwards   Subjective: Patient seen and examined at bedside.  Still has intermittent abdominal pain.  No overnight fever or vomiting.  Objective: Vitals:   04/10/18 0446 04/10/18 1356 04/10/18 2119 04/11/18 0555  BP: 118/68 129/69 130/64 133/63  Pulse: 75 63 66 69  Resp: 18 18 18 18   Temp: 98.2 F (36.8 C) (!) 97.5 F (36.4 C) 98 F (36.7 C) 98.4 F (36.9 C)  TempSrc: Oral Oral Oral Oral  SpO2: 99% 100% 99% 100%  Weight:      Height:        Intake/Output Summary (Last 24 hours) at 04/11/2018 0949 Last data filed at 04/11/2018 3785 Gross per 24 hour  Intake 923.58 ml  Output -  Net 923.58 ml   Filed Weights   04/09/18 0314  Weight: 92.5 kg    Examination:  General exam: Appears calm and comfortable  Respiratory system: Bilateral decreased breath sounds at bases Cardiovascular system: S1 & S2 heard, Rate controlled Gastrointestinal system: Abdomen is nondistended, soft and mildly tender in the periumbilical and right lower quadrants. Normal bowel sounds heard. Extremities: No cyanosis, clubbing, edema    Data Reviewed: I have personally reviewed following labs and imaging studies  CBC: Recent Labs  Lab 04/07/18 0537 04/08/18 0419 04/09/18 0603 04/10/18 0521 04/11/18 0338  WBC 9.3 10.3 7.7 6.8 6.7  NEUTROABS 7.5  --  5.5 4.3  --   HGB 10.2* 10.3* 9.9*  10.3* 9.7*  HCT 30.4* 30.2* 29.4* 31.0* 30.1*  MCV 82.6 83.0 83.1 83.3 82.2  PLT 290 292 290 278 683   Basic Metabolic Panel: Recent Labs  Lab 04/07/18 0537 04/08/18 0419 04/09/18 0603 04/10/18 0521 04/11/18 0338  NA 137 138 137 137 136  K 4.1 4.0 4.1 4.0 4.1  CL 107 107 107 107 104  CO2 19* 20* 21* 23 23   GLUCOSE 116* 99 93 103* 92  BUN 28* 16 13 14 10   CREATININE 1.97* 1.59* 1.43* 1.36* 1.27*  CALCIUM 8.8* 8.7* 8.3* 8.5* 8.5*  MG  --   --  1.6* 1.9 1.8   GFR: Estimated Creatinine Clearance: 36.5 mL/min (A) (by C-G formula based on SCr of 1.27 mg/dL (H)). Liver Function Tests: Recent Labs  Lab 04/05/18 1301 04/07/18 0537  AST 41 33  ALT 26 20  ALKPHOS 138* 105  BILITOT 1.3* 0.8  PROT 8.1 6.7  ALBUMIN 3.2* 2.4*   Recent Labs  Lab 04/05/18 1301  LIPASE 29   No results for input(s): AMMONIA in the last 168 hours. Coagulation Profile: Recent Labs  Lab 04/06/18 0749  INR 1.25   Cardiac Enzymes: No results for input(s): CKTOTAL, CKMB, CKMBINDEX, TROPONINI in the last 168 hours. BNP (last 3 results) No results for input(s): PROBNP in the last 8760 hours. HbA1C: No results for input(s): HGBA1C in the last 72 hours. CBG: No results for input(s): GLUCAP in the last 168 hours. Lipid Profile: No results for input(s): CHOL, HDL, LDLCALC, TRIG, CHOLHDL, LDLDIRECT in the last 72 hours. Thyroid Function Tests: No results for input(s): TSH, T4TOTAL, FREET4, T3FREE, THYROIDAB in the last 72 hours. Anemia Panel: No results for input(s): VITAMINB12, FOLATE, FERRITIN, TIBC, IRON, RETICCTPCT in the last 72 hours. Sepsis Labs: Recent Labs  Lab 04/05/18 2240 04/06/18 0059  LATICACIDVEN 1.1 0.7    Recent Results (from the past 240 hour(s))  Culture, Urine     Status: Abnormal   Collection Time: 04/05/18  8:02 PM  Result Value Ref Range Status   Specimen Description URINE, RANDOM  Final   Special Requests NONE  Final   Culture (A)  Final    <10,000 COLONIES/mL INSIGNIFICANT GROWTH Performed at [facility], [address], [place],  [address]    Report Status 04/07/2018 FINAL  Final         Radiology Studies: Ct Abdomen Pelvis Wo Contrast  Result Date: 04/10/2018 CLINICAL DATA:  77 year old female with appendicitis. Prior hysterectomy. EXAM: CT ABDOMEN  AND PELVIS WITHOUT CONTRAST TECHNIQUE: Multidetector CT imaging of the abdomen and pelvis was performed following the standard protocol without IV contrast. COMPARISON:  None. FINDINGS: Lower chest: Calcified right hilar lymph nodes consistent with prior granulomatous exposure. Heart size top-normal. Coronary artery calcifications. Hepatobiliary: Taking into account limitation by non contrast imaging, no worrisome hepatic lesion. Post cholecystectomy. Pancreas: Taking into account limitation by non contrast imaging, no pancreatic mass or inflammation. Spleen: Taking into account limitation by non contrast imaging, no worrisome splenic mass. Tiny calcified granulomas. No enlargement. Adrenals/Urinary Tract: Mild fullness renal pelvis bilaterally without obstructing stone noted. This may be related to full bladder. Taking into account limitation by non contrast imaging, no worrisome renal or bladder lesion. Hyperplasia adrenal glands larger on the left. Calcification left adrenal gland may indicate prior granulomatous involvement. Stomach/Bowel: Retrocecal inflammatory process which may represent ruptured appendicitis with adjacent reactive lymph nodes spans over 6.2 x 5.2 x 6.1 cm versus prior 6 x 5.1 x 6 cm.  No well-defined central abscess identified (evaluation limited without contrast, contrast not administered secondary to poor renal function). Interval increase in thickening of the cecal tip and terminal ileum consistent with progressive secondary inflammation of such. Underlying mass not excluded. Diverticulosis descending colon. Very small hiatal hernia. Decompressed stomach without gross abnormality. Vascular/Lymphatic: Atherosclerotic changes aorta and aortic branch vessels. No abdominal aortic aneurysm. Reproductive: Post hysterectomy. Right ovary in place. No worrisome adnexal mass noted. Other: No free air or bowel containing hernia. Mild diastases rectus muscles. Transverse colon extends to the  undersurface of this diastasis where small fat and vessel containing hernia is noted. Musculoskeletal: Degenerative changes throughout the lower thoracic and lumbar spine. Hip joint degenerative changes. Sacroiliac joint degenerative changes. IMPRESSION: 1. Retrocecal inflammatory process which may represent ruptured appendicitis with adjacent reactive lymph nodes spans over 6.2 x 5.2 x 6.1 cm versus prior 6 x 5.1 x 6 cm. No well-defined central abscess identified (evaluation limited without contrast, contrast not administered secondary to poor renal function). Interval increase in thickening of the cecal tip and terminal ileum consistent with progressive secondary inflammation of such. Underlying mass not excluded. 2. Mild fullness renal pelvis bilaterally without obstructing stone noted. This may be related to full bladder. 3. Aortic Atherosclerosis (ICD10-I70.0). Coronary artery calcifications 4. Remainder of findings without change as detailed above. These results will be called to the ordering clinician or representative by the Radiologist Assistant, and communication documented in the PACS or zVision Dashboard. Electronically Signed   By: Genia Del M.D.   On: 04/10/2018 15:01        Scheduled Meds: . allopurinol  100 mg Oral Daily  . atorvastatin  80 mg Oral q1800  . feeding supplement (ENSURE ENLIVE)  237 mL Oral BID BM  . gabapentin  100 mg Oral QHS  . levothyroxine  50 mcg Oral QAC breakfast  . metoprolol tartrate  12.5 mg Oral BID  . multivitamin with minerals  1 tablet Oral Daily  . ondansetron (ZOFRAN) IV  4 mg Intravenous Once  . oxybutynin  5 mg Oral BID  . pantoprazole  40 mg Oral BID   Continuous Infusions: . ceFEPime (MAXIPIME) IV Stopped (04/10/18 2109)  . metronidazole 500 mg (04/11/18 0521)     LOS: 6 days        Aline August, MD [facility] Pager [phone]  If 7PM-7AM, please contact night-coverage [email or web address] Password [email or web address] 04/11/2018, 9:49  AM

## 2018-04-11 NOTE — Progress Notes (Signed)
[facility]/Trauma Progress Note      Assessment/Plan Perforated appendicitis with abscess - cannot rule out mass - afebrile, VSS, leukocytosis resolved - IR denied drain placement 10/4, states it looks more phlegmonous  - Continue broad spectrum antibiotics. - CT 10/08 without real change  ID -cefepime/flagyl 10/3>> FEN -IVF,NPO VTE -SCDs Foley: none Follow up: TBD  DISPO: considering surgery today. Will discuss with Dr. Donne Hazel.     LOS: 6 days    Subjective: CC: RLQ abdominal pain  Pt states pain is better today yesterday but still needed pain medicine. No nausea, vomiting, fever, chills overnight. She is having bowel function. No family at bedside.   Objective: Vital signs in last 24 hours: Temp:  [97.5 F (36.4 C)-98.4 F (36.9 C)] 98.4 F (36.9 C) (10/09 0555) Pulse Rate:  [63-69] 69 (10/09 0555) Resp:  [18] 18 (10/09 0555) BP: (129-133)/(63-69) 133/63 (10/09 0555) SpO2:  [99 %-100 %] 100 % (10/09 0555) Last BM Date: 04/09/18  Intake/Output from previous day: 10/08 0701 - 10/09 0700 In: 1163.6 [P.O.:600; IV Piggyback:563.6] Out: -  Intake/Output this shift: No intake/output data recorded.  PE: Gen:  Alert, NAD, pleasant, cooperative Pulm:  Rate and effort normal Abd: Soft, obese, ND, +BS, generalized TTP worse in the RLQ, + guarding. Skin: no rashes noted, warm and dry  Anti-infectives: Anti-infectives (From admission, onward)   Start     Dose/Rate Route Frequency Ordered Stop   04/06/18 2000  ceFEPIme (MAXIPIME) 1 g in sodium chloride 0.9 % 100 mL IVPB     1 g 200 mL/hr over 30 Minutes Intravenous Every 24 hours 04/05/18 2005     04/06/18 0600  metroNIDAZOLE (FLAGYL) IVPB 500 mg     500 mg 100 mL/hr over 60 Minutes Intravenous Every 8 hours 04/05/18 1957     04/05/18 1930  ceFEPIme (MAXIPIME) 2 g in sodium chloride 0.9 % 100 mL IVPB     2 g 200 mL/hr over 30 Minutes Intravenous  Once 04/05/18 1921 04/06/18 0104   04/05/18 1930  metroNIDAZOLE (FLAGYL) IVPB 500 mg     500 mg 100 mL/hr over 60 Minutes Intravenous  Once 04/05/18 1921 04/05/18 2236      Lab Results:  Recent Labs    04/10/18 0521 04/11/18 0338  WBC 6.8 6.7  HGB 10.3* 9.7*  HCT 31.0* 30.1*  PLT 278 350   BMET Recent Labs    04/10/18 0521 04/11/18 0338  NA 137 136  K 4.0 4.1  CL 107 104  CO2 23 23  GLUCOSE 103* 92  BUN 14 10  CREATININE 1.36* 1.27*  CALCIUM 8.5* 8.5*   PT/INR No results for input(s): LABPROT, INR in the last 72 hours. CMP     Component Value Date/Time   NA 136 04/11/2018 0338   K 4.1 04/11/2018 0338   CL 104 04/11/2018 0338   CO2 23 04/11/2018 0338   GLUCOSE 92 04/11/2018 0338   BUN 10 04/11/2018 0338   CREATININE 1.27 (H) 04/11/2018 0338   CALCIUM 8.5 (L) 04/11/2018 0338   PROT 6.7 04/07/2018 0537   ALBUMIN 2.4 (L) 04/07/2018 0537   AST 33 04/07/2018 0537   ALT 20 04/07/2018 0537   ALKPHOS 105 04/07/2018 0537   BILITOT 0.8 04/07/2018 0537   GFRNONAA 39 (L) 04/11/2018 0338   GFRAA 45 (L) 04/11/2018 0338   Lipase     Component Value Date/Time   LIPASE 29 04/05/2018 1301    Studies/Results: Ct Abdomen Pelvis Wo Contrast  Result Date: 04/10/2018 CLINICAL DATA:  77 year old female with appendicitis. Prior hysterectomy. EXAM: CT ABDOMEN AND PELVIS WITHOUT CONTRAST TECHNIQUE: Multidetector CT imaging of the abdomen and pelvis was performed following the standard protocol without IV contrast. COMPARISON:  None. FINDINGS: Lower chest: Calcified right hilar lymph nodes consistent with prior granulomatous exposure. Heart size top-normal. Coronary artery calcifications. Hepatobiliary: Taking into account limitation by non contrast imaging, no worrisome hepatic lesion. Post cholecystectomy. Pancreas: Taking into account limitation by non contrast imaging, no pancreatic mass or inflammation. Spleen: Taking into account limitation by non contrast imaging, no worrisome splenic mass. Tiny calcified  granulomas. No enlargement. Adrenals/Urinary Tract: Mild fullness renal pelvis bilaterally without obstructing stone noted. This may be related to full bladder. Taking into account limitation by non contrast imaging, no worrisome renal or bladder lesion. Hyperplasia adrenal glands larger on the left. Calcification left adrenal gland may indicate prior granulomatous involvement. Stomach/Bowel: Retrocecal inflammatory process which may represent ruptured appendicitis with adjacent reactive lymph nodes spans over 6.2 x 5.2 x 6.1 cm versus prior 6 x 5.1 x 6 cm. No well-defined central abscess identified (evaluation limited without contrast, contrast not administered secondary to poor renal function). Interval increase in thickening of the cecal tip and terminal ileum consistent with progressive secondary inflammation of such. Underlying mass not excluded. Diverticulosis descending colon. Very small hiatal hernia. Decompressed stomach without gross abnormality. Vascular/Lymphatic: Atherosclerotic changes aorta and aortic branch vessels. No abdominal aortic aneurysm. Reproductive: Post hysterectomy. Right ovary in place. No worrisome adnexal mass noted. Other: No free air or bowel containing hernia. Mild diastases rectus muscles. Transverse colon extends to the undersurface of this diastasis where small fat and vessel containing hernia is noted. Musculoskeletal: Degenerative changes throughout the lower thoracic and lumbar spine. Hip joint degenerative changes. Sacroiliac joint degenerative changes. IMPRESSION: 1. Retrocecal inflammatory process which may represent ruptured appendicitis with adjacent reactive lymph nodes spans over 6.2 x 5.2 x 6.1 cm versus prior 6 x 5.1 x 6 cm. No well-defined central abscess identified (evaluation limited without contrast, contrast not administered secondary to poor renal function). Interval increase in thickening of the cecal tip and terminal ileum consistent with progressive secondary  inflammation of such. Underlying mass not excluded. 2. Mild fullness renal pelvis bilaterally without obstructing stone noted. This may be related to full bladder. 3. Aortic Atherosclerosis (ICD10-I70.0). Coronary artery calcifications 4. Remainder of findings without change as detailed above. These results will be called to the ordering clinician or representative by the Radiologist Assistant, and communication documented in the PACS or zVision Dashboard. Electronically Signed   By: Genia Del M.D.   On: 04/10/2018 15:01      Kalman Drape , [facility] 04/11/2018, 8:04 AM  Pager: [phone] Mon-Wed, Friday 7:00am-4:30pm Thurs 7am-11:30am  Consults: [phone]

## 2018-04-11 NOTE — Progress Notes (Signed)
PT Cancellation Note  Patient Details Name: Joan Elliott MRN: [id number] DOB: [birth year]   Cancelled Treatment:    Reason Eval/Treat Not Completed: Patient at procedure or test/unavailable. Pt currently being transported to the OR for possible apendectomy or colectomy. PT to follow up tomorrow.   Lorriane Shire 04/11/2018, 11:33 AM   Lorrin Goodell, PT  Office # [phone] Pager [phone]

## 2018-04-11 NOTE — Progress Notes (Signed)
OT EVALUATION  Pt currently demonstrates fall risk with adls and requires rest breaks. Pt requires seated bathing at sink level and required rest break x1 with DOE 3 out 4 during session. Pt reports fatigue after task. OT to follow up after surgery to reassess ability to complete adls. Pt currently requires (A) for LB max (A) and could benefit from ADL education.   04/11/18 0800  OT Visit Information  Last OT Received On 04/11/18  Assistance Needed +1  History of Present Illness Pt is a 77 y/o female admitted secondry to worsen in RLQ pain. Thought to have acute perforated appendicitis with abscess, however, a mass has not been ruled out. IR denied drain placement per notes. planned OR 04/11/18 per notes PMH includes CKD and HTN.    Precautions  Precautions None  Restrictions  Weight Bearing Restrictions No  Home Living  Family/patient expects to be discharged to: Private residence  Living Arrangements Alone  Available Help at Discharge Family;Available PRN/intermittently  Type of Home House  Home Access Stairs to enter  Entrance Stairs-Number of Steps 2  Entrance Stairs-Rails Right;Left  Home Layout One level  Bathroom Shower/Tub Tub/shower unit  Tax adviser - 2 wheels;Walker - 4 wheels  Prior Function  Level of Independence Independent with assistive device(s)  Comments Reports she uses rollator or RW for ambulation outside of her house.   Communication  Communication No difficulties  Pain Assessment  Pain Assessment No/denies pain  Cognition  Arousal/Alertness Awake/alert  Behavior During Therapy WFL for tasks assessed/performed  Overall Cognitive Status Within Functional Limits for tasks assessed  Upper Extremity Assessment  Upper Extremity Assessment Overall WFL for tasks assessed  Lower Extremity Assessment  Lower Extremity Assessment Defer to PT evaluation  Cervical / Trunk Assessment  Cervical / Trunk Assessment Normal  ADL   Overall ADL's  Needs assistance/impaired  Eating/Feeding Independent  Grooming Wash/dry hands;Wash/dry face;Oral care;Applying deodorant;Modified independent;Sitting  Upper Body Bathing Modified independent;Sitting  Upper Body Bathing Details (indicate cue type and reason) requires seated surface  Lower Body Bathing Sit to/from stand;Maximal assistance  Lower Body Bathing Details (indicate cue type and reason) requires (A) for posterior peri care in static standing with bil ue on RW for support  Lower Body Dressing Maximal assistance;Sit to/from Retail buyer Supervision/safety (using IV pole for transfer)  Functional mobility during ADLs Min guard (could benefit from cane )  Vision- History  Baseline Vision/History Wears glasses  Wears Glasses Reading only  Bed Mobility  Overal bed mobility Independent  General bed mobility comments using bed rail to roll onto R side and then able to push up with Elbow  Transfers  Overall transfer level Needs assistance  Transfers Sit to/from Stand  Sit to Stand Supervision  General transfer comment pt reaching for IV pole and requires R UE support for transfer  Balance  Overall balance assessment Needs assistance  Sitting-balance support No upper extremity supported;Feet supported  Sitting balance-Leahy Scale Normal  Standing balance support Bilateral upper extremity supported;During functional activity  Standing balance-Leahy Scale Poor  Standing balance comment requires UE support and leaning on sink surface during adl for balance  General Comments  General comments (skin integrity, edema, etc.) educated on use of clean linen after surgery . washing the incision area first then washing away from surgerical area  OT - End of Session  Equipment Utilized During Treatment Gait belt  Activity Tolerance Patient tolerated treatment well  Patient left in chair;with call bell/phone  within reach  Nurse Communication Mobility  status;Precautions  OT Assessment  OT Recommendation/Assessment Patient needs continued OT Services  OT Visit Diagnosis Unsteadiness on feet (R26.81);Muscle weakness (generalized) (M62.81)  OT Problem List Decreased strength;Decreased activity tolerance;Impaired balance (sitting and/or standing);Decreased safety awareness;Decreased knowledge of use of DME or AE;Decreased knowledge of precautions;Cardiopulmonary status limiting activity;Obesity  OT Plan  OT Frequency (ACUTE ONLY) Min 2X/week  OT Treatment/Interventions (ACUTE ONLY) Self-care/ADL training;Therapeutic exercise;Neuromuscular education;Energy conservation;DME and/or AE instruction;Manual therapy;Therapeutic activities;Patient/family education;Balance training  AM-PAC OT "6 Clicks" Daily Activity Outcome Measure  Help from another person eating meals? 4  Help from another person taking care of personal grooming? 4  Help from another person toileting, which includes using toliet, bedpan, or urinal? 3  Help from another person bathing (including washing, rinsing, drying)? 2  Help from another person to put on and taking off regular upper body clothing? 3  Help from another person to put on and taking off regular lower body clothing? 2  6 Click Score 18  ADL G Code Conversion CK  OT Recommendation  Follow Up Recommendations Home health OT  OT Equipment 3 in 1 bedside commode  Individuals Consulted  Consulted and Agree with Results and Recommendations Patient  Acute Rehab OT Goals  Patient Stated Goal none stated but did report did not sleep well due to pending surgery  OT Goal Formulation With patient  Time For Goal Achievement 04/25/18  Potential to Achieve Goals Good  OT Time Calculation  OT Start Time (ACUTE ONLY) 0840  OT Stop Time (ACUTE ONLY) 0906  OT Time Calculation (min) 26 min  OT General Charges  $OT Visit 1 Visit  OT Evaluation  $OT Eval Moderate Complexity 1 Mod  OT Treatments  $Self Care/Home Management  8-22  mins  Written Expression  Dominant Hand Right    Jeri Modena, OTR/L  Acute Rehabilitation Services Pager: [phone] Office: [phone] .

## 2018-04-11 NOTE — Op Note (Signed)
Preoperative diagnosis: Localized right colonic perforation with mass Postoperative diagnosis: Same as above Procedure: Diagnostic laparoscopy with 45 minutes of lysis of adhesions 2.  Open right colectomy Surgeon: Dr. Serita Grammes Assistant: Will Creig Hines Anesthesia: General Estimated blood loss: 150 cc Drains: 19 French Blake drain to right retroperitoneal abscess Complications: None Sponge needle count was correct x2 at an operation Disposition to stepdown in stable condition  Indications: This is a 77 year old female with multiple comorbidities who has had a couple weeks of abdominal pain.  She was admitted last week and was thought to have localized perforated appendicitis.  She initially got better on antibiotics but her exam is worsened.  A repeat CAT scan showed really nothing that was drainable and no change in this area.  I discussed going to the operating room for attempt at laparoscopy to evaluate this area and possibly performing a right colectomy as I am concerned that there is a mass in this region.  Procedure: After informed consent was obtained the patient was taken the operating room.  She was already on antibiotics.  SCDs were placed.  She was placed under general anesthesia without complication.  An orogastric tube and Foley catheter were placed.  She was then prepped and draped in standard sterile surgical fashion.  Surgical timeout was then performed.  I infiltrate a marking the left upper quadrant.  I made an incision with an 11 blade.  I then proceeded to access the peritoneum with a 5 mm Optiview trocar.  There was no evidence of an entry injury.  I then insufflated the abdomen to 15 mmHg pressure.  I immediately encountered dense adhesions from the supraumbilical position all the way down into her pelvis.  I was able to go across to the right side and I placed 3 5 mm trochars on the right side.  I then spent 45 minutes lysing mostly omental adhesions to the anterior  abdominal wall and was able to then evaluate the right side.  There were no evidence of any bowel injury during this.  I then found the cecum.  The cecum was adherent to the sidewall and there was a large mass that was present in this region.  I elected at that point to do an open right colectomy.  I made an incision and entered in the abdomen.  I lysed a few more anterior abdominal wall adhesions.  I then was able to identify the terminal ileum.  I ran the small bowel several times and there was no evidence of an injury.  I then used a accommodation of blunt dissection and cautery to release the cecum from the abdominal wall.  There was a perforation of the colon that appeared to be from appendicitis but will need to follow-up in the final pathology to make sure.  This abscess extended into the right retroperitoneum.  This was all along the psoas muscle.  I then divided the white line of Toldt and rolled up the right colon medially.  The duodenum was identified and not injured.  I then remove the omentum from the transverse colon.  I picked a spot on the terminal ileum and divided this with a GIA stapler.  I did the same to the transverse colon.  I then used a LigaSure device to divide the mesentery and remove the right colon.  This was sent for pathology.  Irrigation was performed.  Hemostasis was observed.  I then closed the mesenteric defect after ensuring the small bowel was in the correct  position with 3-0 silk sutures.  I then brought the small bowel next to the transverse colon.  I put 3 oh silks to approximate them.  I then made enterotomies in both and used a GIA stapler to create an anastomosis.  The common enterotomy was closed with a TX stapler.  The mesenteric defect was completely closed with silk suture.  The anastomosis that appeared to be hemostatic.  It was patent and viable.  I placed the omentum overlying it.  I then placed a 46 [place] Blake drain and ran this into the abscess cavity.  This was  secured with a 2-0 nylon suture.  I then placed the omentum overlying this.  We then switched to the clean portion of the protocol.  I closed the abdomen with #1 looped PDS with a number of intermittent #1 Novafil sutures.  The wound was left open and a negative pressure dressing was placed.  She tolerated this well extubated and will be transferred to stepdown for monitoring overnight.

## 2018-04-11 NOTE — Anesthesia Procedure Notes (Signed)
Central Venous Catheter Insertion Performed by: Effie Berkshire, MD, anesthesiologist Start/End10/03/2018 12:45 PM, 04/11/2018 12:55 PM Patient location: Pre-op. Preanesthetic checklist: patient identified, IV checked, site marked, risks and benefits discussed, surgical consent, monitors and equipment checked, pre-op evaluation, timeout performed and anesthesia consent Position: Trendelenburg Lidocaine 1% used for infiltration and patient sedated Hand hygiene performed , maximum sterile barriers used  and Seldinger technique used Catheter size: 8 Fr Total catheter length 16. Central line was placed.Double lumen Procedure performed using ultrasound guided technique. Ultrasound Notes:anatomy identified, needle tip was noted to be adjacent to the nerve/plexus identified, no ultrasound evidence of intravascular and/or intraneural injection and image(s) printed for medical record Attempts: 1 Following insertion, dressing applied, line sutured and Biopatch. Post procedure assessment: blood return through all ports  Patient tolerated the procedure well with no immediate complications.

## 2018-04-11 NOTE — Progress Notes (Signed)
Nutrition Follow-up  DOCUMENTATION CODES:   Obesity unspecified  INTERVENTION:   -RD will follow for diet advancement and adjust supplement regimen as appropriate -Once diet is advanced, resume: -Ensure Enlive po BID, each supplement provides 350 kcal and 20 grams of protein and MVI with minerals daily  NUTRITION DIAGNOSIS:   Inadequate oral intake related to altered GI function, decreased appetite as evidenced by meal completion < 50%, per patient/family report.  Oongoing  GOAL:   Patient will meet greater than or equal to 90% of their needs  Progressing  MONITOR:   PO intake, Supplement acceptance, Diet advancement, Labs, Weight trends, Skin, I & O's  REASON FOR ASSESSMENT:   Malnutrition Screening Tool    ASSESSMENT:   Joan Elliott is a 77 y.o. female with medical history significant of CKD stage 3, HTN, stroke, CAD. Patient presents to the ED with ~7 day history of worsening RLQ abd pain.  Associated anorexia and poor PO intake.  Passing gas but no BM in past couple of days.  Symptoms worsening, nothing makes it better nor worse.  Had nausea without vomiting.  Had chills but no fevers.  Urine color change over past week.  10/4- per IR notes, drain noted placed as collection is phlegmon   Pt sleeping soundly at time of visit. Prior to today, pt was on a full liquid diet with fair to poor intake (PO: 10-65%). Pt was also consuming Ensure supplements per MAR.  Pt is NPO for lap and possible appendectomy. Per general surgery notes, CT has not changes much and concerning that pt has something more than perforated appendicitis.   Labs reviewed.   Diet Order:   Diet Order            Diet NPO time specified  Diet effective now              EDUCATION NEEDS:   No education needs have been identified at this time  Skin:  Skin Assessment: Reviewed RN Assessment  Last BM:  04/09/18  Height:   Ht Readings from Last 1 Encounters:  04/09/18 5' (1.524 m)     Weight:   Wt Readings from Last 1 Encounters:  04/09/18 92.5 kg    Ideal Body Weight:  45.5 kg  BMI:  Body mass index is 39.83 kg/m.  Estimated Nutritional Needs:   Kcal:  1600-1800  Protein:  75-90 grams  Fluid:  >1.6 L    Alexzandria Massman A. Jimmye Norman, RD, LDN, CDE Pager: [phone] After hours Pager: [phone]

## 2018-04-11 NOTE — Progress Notes (Signed)
Pt. Came to floor from PACu at 630pm.  VS are stable.  Pt is alert and oriented but in a lot of pain.  Set up PC monitor.  Medicated patient for pain.  Report will be given to oncoming nurse.

## 2018-04-11 NOTE — Anesthesia Procedure Notes (Signed)
Procedure Name: Intubation Date/Time: 04/11/2018 2:14 PM Performed by: Candis Shine, CRNA Pre-anesthesia Checklist: Patient identified, Emergency Drugs available, Suction available and Patient being monitored Patient Re-evaluated:Patient Re-evaluated prior to induction Oxygen Delivery Method: Circle System Utilized Preoxygenation: Pre-oxygenation with 100% oxygen Induction Type: IV induction, Rapid sequence and Cricoid Pressure applied Laryngoscope Size: Mac and 3 Grade View: Grade I Tube type: Oral Tube size: 7.0 mm Number of attempts: 1 Airway Equipment and Method: Stylet Placement Confirmation: ETT inserted through vocal cords under direct vision,  positive ETCO2 and breath sounds checked- equal and bilateral Secured at: 22 cm Tube secured with: Tape Dental Injury: Teeth and Oropharynx as per pre-operative assessment

## 2018-04-11 NOTE — Anesthesia Preprocedure Evaluation (Addendum)
Anesthesia Evaluation  Patient identified by MRN, date of birth, ID band Patient awake    Reviewed: Allergy & Precautions, NPO status , Patient's Chart, lab work & pertinent test results  Airway Mallampati: I  TM Distance: >3 FB Neck ROM: Full    Dental  (+) Poor Dentition, Missing, Chipped,    Pulmonary COPD, Current Smoker,    breath sounds clear to auscultation       Cardiovascular hypertension, Pt. on medications and Pt. on home beta blockers + CAD, + Cardiac Stents and + Peripheral Vascular Disease   Rhythm:Regular Rate:Normal     Neuro/Psych CVA    GI/Hepatic GERD  Medicated,  Endo/Other  Hypothyroidism   Renal/GU CRFRenal disease     Musculoskeletal  (+) Arthritis ,   Abdominal Normal abdominal exam  (+)   Peds  Hematology   Anesthesia Other Findings - HLD  Reproductive/Obstetrics                            Lab Results  Component Value Date   WBC 6.7 04/11/2018   HGB 9.7 (L) 04/11/2018   HCT 30.1 (L) 04/11/2018   MCV 82.2 04/11/2018   PLT 350 04/11/2018   Lab Results  Component Value Date   CREATININE 1.27 (H) 04/11/2018   BUN 10 04/11/2018   NA 136 04/11/2018   K 4.1 04/11/2018   CL 104 04/11/2018   CO2 23 04/11/2018   Lab Results  Component Value Date   INR 1.25 04/06/2018   INR 1.00 03/11/2017   INR 1.11 02/24/2015   Echo: - Left ventricle: The cavity size was normal. There was moderate   focal basal hypertrophy. Systolic function was normal. The   estimated ejection fraction was in the range of 60% to 65%. Wall   motion was normal; there were no regional wall motion   abnormalities. Features are consistent with a pseudonormal left   ventricular filling pattern, with concomitant abnormal relaxation   and increased filling pressure (grade 2 diastolic dysfunction).   Doppler parameters are consistent with high ventricular filling   pressure. - Mitral valve:  Calcified annulus. - Tricuspid valve: There was mild regurgitation. - Pulmonic valve: There was trivial regurgitation. - Pulmonary arteries: PA peak pressure: 33 mm Hg (S).  Anesthesia Physical Anesthesia Plan  ASA: III  Anesthesia Plan: General   Post-op Pain Management:    Induction: Intravenous  PONV Risk Score and Plan: 3 and Ondansetron, Dexamethasone and Treatment may vary due to age or medical condition  Airway Management Planned: Oral ETT  Additional Equipment: CVP and Ultrasound Guidance Line Placement  Intra-op Plan:   Post-operative Plan: Possible Post-op intubation/ventilation  Informed Consent: I have reviewed the patients History and Physical, chart, labs and discussed the procedure including the risks, benefits and alternatives for the proposed anesthesia with the patient or authorized representative who has indicated his/her understanding and acceptance.   Dental advisory given  Plan Discussed with: CRNA  Anesthesia Plan Comments:        Anesthesia Quick Evaluation

## 2018-04-11 NOTE — Transfer of Care (Signed)
Immediate Anesthesia Transfer of Care Note  Patient: Joan Elliott  Procedure(s) Performed: LAPAROSCOPY DIAGNOSTIC (N/A Abdomen) COLECTOMY (N/A Abdomen) EXPLORATORY LAPAROTOMY (N/A Abdomen) LYSIS OF ADHESION (N/A Abdomen)  Patient Location: PACU  Anesthesia Type:General  Level of Consciousness: awake, alert  and oriented  Airway & Oxygen Therapy: Patient Spontanous Breathing and Patient connected to nasal cannula oxygen  Post-op Assessment: Report given to RN and Post -op Vital signs reviewed and stable  Post vital signs: Reviewed and stable  Last Vitals:  Vitals Value Taken Time  BP 135/71 04/11/2018  4:28 PM  Temp    Pulse 87 04/11/2018  4:31 PM  Resp 20 04/11/2018  4:31 PM  SpO2 99 % 04/11/2018  4:31 PM  Vitals shown include unvalidated device data.  Last Pain:  Vitals:   04/11/18 0555  TempSrc: Oral  PainSc:       Patients Stated Pain Goal: 0 (00/37/04 8889)  Complications: No apparent anesthesia complications

## 2018-04-12 ENCOUNTER — Encounter (HOSPITAL_COMMUNITY): Payer: Self-pay | Admitting: General Surgery

## 2018-04-12 DIAGNOSIS — K631 Perforation of intestine (nontraumatic): Secondary | ICD-10-CM

## 2018-04-12 LAB — CBC WITH DIFFERENTIAL/PLATELET
ABS IMMATURE GRANULOCYTES: 0.06 10*3/uL (ref 0.00–0.07)
BASOS PCT: 0 %
Basophils Absolute: 0 10*3/uL (ref 0.0–0.1)
EOS ABS: 0 10*3/uL (ref 0.0–0.5)
Eosinophils Relative: 0 %
HEMATOCRIT: 30.5 % — AB (ref 36.0–46.0)
Hemoglobin: 10 g/dL — ABNORMAL LOW (ref 12.0–15.0)
Immature Granulocytes: 0 %
Lymphocytes Relative: 3 %
Lymphs Abs: 0.4 10*3/uL — ABNORMAL LOW (ref 0.7–4.0)
MCH: 27.5 pg (ref 26.0–34.0)
MCHC: 32.8 g/dL (ref 30.0–36.0)
MCV: 84 fL (ref 80.0–100.0)
MONO ABS: 0.5 10*3/uL (ref 0.1–1.0)
Monocytes Relative: 3 %
NEUTROS ABS: 13.7 10*3/uL — AB (ref 1.7–7.7)
Neutrophils Relative %: 94 %
PLATELETS: 394 10*3/uL (ref 150–400)
RBC: 3.63 MIL/uL — ABNORMAL LOW (ref 3.87–5.11)
RDW: 14.6 % (ref 11.5–15.5)
WBC: 14.6 10*3/uL — ABNORMAL HIGH (ref 4.0–10.5)
nRBC: 0 % (ref 0.0–0.2)

## 2018-04-12 LAB — BASIC METABOLIC PANEL WITH GFR
Anion gap: 11 (ref 5–15)
BUN: 15 mg/dL (ref 8–23)
CO2: 22 mmol/L (ref 22–32)
Calcium: 8.7 mg/dL — ABNORMAL LOW (ref 8.9–10.3)
Chloride: 106 mmol/L (ref 98–111)
Creatinine, Ser: 1.49 mg/dL — ABNORMAL HIGH (ref 0.44–1.00)
GFR calc Af Amer: 37 mL/min — ABNORMAL LOW
GFR calc non Af Amer: 32 mL/min — ABNORMAL LOW
Glucose, Bld: 127 mg/dL — ABNORMAL HIGH (ref 70–99)
Potassium: 5.1 mmol/L (ref 3.5–5.1)
Sodium: 139 mmol/L (ref 135–145)

## 2018-04-12 LAB — MAGNESIUM: Magnesium: 1.9 mg/dL (ref 1.7–2.4)

## 2018-04-12 MED ORDER — SODIUM CHLORIDE 0.9 % IV SOLN
INTRAVENOUS | Status: DC
  Administered 2018-04-12 – 2018-04-16 (×8): via INTRAVENOUS

## 2018-04-12 MED ORDER — HALOPERIDOL LACTATE 5 MG/ML IJ SOLN
2.0000 mg | Freq: Once | INTRAMUSCULAR | Status: AC
  Administered 2018-04-12: 2 mg via INTRAVENOUS
  Filled 2018-04-12: qty 1

## 2018-04-12 MED ORDER — METHOCARBAMOL 1000 MG/10ML IJ SOLN
500.0000 mg | Freq: Three times a day (TID) | INTRAVENOUS | Status: DC
  Administered 2018-04-12 – 2018-04-18 (×17): 500 mg via INTRAVENOUS
  Filled 2018-04-12 (×12): qty 5
  Filled 2018-04-12: qty 500
  Filled 2018-04-12 (×2): qty 5
  Filled 2018-04-12: qty 500
  Filled 2018-04-12 (×2): qty 5
  Filled 2018-04-12: qty 500
  Filled 2018-04-12: qty 5

## 2018-04-12 MED ORDER — ACETAMINOPHEN 10 MG/ML IV SOLN
1000.0000 mg | Freq: Four times a day (QID) | INTRAVENOUS | Status: DC
  Administered 2018-04-12 – 2018-04-13 (×3): 1000 mg via INTRAVENOUS
  Filled 2018-04-12 (×4): qty 100

## 2018-04-12 MED ORDER — ENOXAPARIN SODIUM 40 MG/0.4ML ~~LOC~~ SOLN
40.0000 mg | SUBCUTANEOUS | Status: DC
  Administered 2018-04-12 – 2018-04-24 (×13): 40 mg via SUBCUTANEOUS
  Filled 2018-04-12 (×13): qty 0.4

## 2018-04-12 MED ORDER — LEVOTHYROXINE SODIUM 100 MCG IV SOLR
25.0000 ug | Freq: Every day | INTRAVENOUS | Status: DC
  Administered 2018-04-12 – 2018-04-14 (×3): 25 ug via INTRAVENOUS
  Filled 2018-04-12 (×3): qty 5

## 2018-04-12 MED ORDER — FAMOTIDINE IN NACL 20-0.9 MG/50ML-% IV SOLN
20.0000 mg | INTRAVENOUS | Status: DC
  Administered 2018-04-12 – 2018-04-16 (×5): 20 mg via INTRAVENOUS
  Filled 2018-04-12 (×5): qty 50

## 2018-04-12 NOTE — Progress Notes (Signed)
PT Cancellation Note  Patient Details Name: MCKINSLEY KOELZER MRN: [id number] DOB: 1941/05/15   Cancelled Treatment:    Reason Eval/Treat Not Completed: Patient not medically ready. New MD order received as well as documentation in surgical progress note for PT to start tomorrow (04/13/18).   Lorriane Shire 04/12/2018, 11:33 AM

## 2018-04-12 NOTE — Progress Notes (Signed)
1 Day Post-Op    CC: Perforated appendix  Subjective: Patient is awake and alert this morning.  Pain seems to be fairly well controlled.  She has been up to walk to the bathroom.  No bowel sounds, no flatus.  Serous fluid from the JP.  Wound VAC in place.  Objective: Vital signs in last 24 hours: Temp:  [97.6 F (36.4 C)-97.9 F (36.6 C)] 97.6 F (36.4 C) (10/10 0437) Pulse Rate:  [80-89] 89 (10/09 1800) Resp:  [18-28] 20 (10/10 0015) BP: (113-137)/(66-75) 114/67 (10/10 0437) SpO2:  [95 %-100 %] 95 % (10/09 1800) Last BM Date: 04/09/18 2740 PO 450 urine recorded Drain 105 Afebrile, VSS WBC up 14.6 HH 10.3/30.2 >> 10/30.5 today WBC up 14.6 Creatinine up some 1.49 Intake/Output from previous day: 10/09 0701 - 10/10 0700 In: 2740 [I.V.:2130; IV Piggyback:550] Out: 630 [Urine:450; Drains:105; Blood:75] Intake/Output this shift: Total I/O In: 30 [Other:30] Out: 450 [Urine:450]  General appearance: alert, cooperative and no distress Resp: clear to auscultation bilaterally and Anterior exam GI: Wound VAC in place.  Serosanguineous fluid from the JP.  No bowel sounds no flatus.  Lab Results:  Recent Labs    04/11/18 0338 04/12/18 0351  WBC 6.7 14.6*  HGB 9.7* 10.0*  HCT 30.1* 30.5*  PLT 350 394    BMET Recent Labs    04/11/18 0338 04/12/18 0351  NA 136 139  K 4.1 5.1  CL 104 106  CO2 23 22  GLUCOSE 92 127*  BUN 10 15  CREATININE 1.27* 1.49*  CALCIUM 8.5* 8.7*   PT/INR No results for input(s): LABPROT, INR in the last 72 hours.  Recent Labs  Lab 04/05/18 1301 04/07/18 0537  AST 41 33  ALT 26 20  ALKPHOS 138* 105  BILITOT 1.3* 0.8  PROT 8.1 6.7  ALBUMIN 3.2* 2.4*     Lipase     Component Value Date/Time   LIPASE 29 04/05/2018 1301     Medications: . allopurinol  100 mg Oral Daily  . atorvastatin  80 mg Oral q1800  . Chlorhexidine Gluconate Cloth  6 each Topical Q0600  . feeding supplement (ENSURE ENLIVE)  237 mL Oral BID BM  .  gabapentin  100 mg Oral QHS  . levothyroxine  50 mcg Oral QAC breakfast  . metoprolol tartrate  12.5 mg Oral BID  . multivitamin with minerals  1 tablet Oral Daily  . ondansetron (ZOFRAN) IV  4 mg Intravenous Once  . oxybutynin  5 mg Oral BID  . pantoprazole  40 mg Oral BID  . sodium chloride flush  10-40 mL Intracatheter Q12H   . sodium chloride    . acetaminophen 1,000 mg (04/12/18 0640)  . ceFEPime (MAXIPIME) IV 1 g (04/11/18 2157)  . metronidazole 500 mg (04/12/18 7782)   Anti-infectives (From admission, onward)   Start     Dose/Rate Route Frequency Ordered Stop   04/06/18 2000  ceFEPIme (MAXIPIME) 1 g in sodium chloride 0.9 % 100 mL IVPB     1 g 200 mL/hr over 30 Minutes Intravenous Every 24 hours 04/05/18 2005     04/06/18 0600  metroNIDAZOLE (FLAGYL) IVPB 500 mg     500 mg 100 mL/hr over 60 Minutes Intravenous Every 8 hours 04/05/18 1957     04/05/18 1930  ceFEPIme (MAXIPIME) 2 g in sodium chloride 0.9 % 100 mL IVPB     2 g 200 mL/hr over 30 Minutes Intravenous  Once 04/05/18 1921 04/06/18 0104   04/05/18 1930  metroNIDAZOLE (FLAGYL) IVPB 500 mg     500 mg 100 mL/hr over 60 Minutes Intravenous  Once 04/05/18 1921 04/05/18 2236      Assessment/Plan Hypertension Chronic kidney disease stage III/acute kidney injury Anemia CAD with history of stents Chronic diastolic congestive heart failure COPD with history of tobacco use Hx of CVA/PVD Hypothyroid Hx gout BMI 39.8  Localized right colonic perforation with mass Diagnostic laparoscopy with 19minutes of lysis of adhesions, right colectomy, placement of right retroperitoneal abscess drain, 04/11/2018 Dr. Rolm Bookbinder - Pathology pending  FEN:  NPO/IV fluids ID:  Flagyl/Maxipime 10/3 =>> day 8 DVT:  Add Lovenox today/SCD Foley:  IN Follow up:  Dr. Donne Hazel  Plan: Continue IV, sips and chips from the floor.  Continue IV antibiotics.  Change wound [facility] Monday Wednesday Friday, and out of bed to chair and  bathroom.  Will start PT tomorrow.  Continue most of her medicines IV.         LOS: 7 days    Sekai Nayak 04/12/2018 [phone]

## 2018-04-12 NOTE — Progress Notes (Signed)
Patient ID: Joan Elliott, female   DOB: 25-Mar-1941, 77 y.o.   MRN: [id number]  PROGRESS NOTE    MARELIN TAT  [id number] DOB: 12/18/1940 DOA: 04/05/2018 PCP: [name], MD   Brief Narrative:  77 year old female with history of chronic kidney disease stage III, hypertension, stroke, CAD presented on 04/05/2018 with abdominal pain.  She was admitted with probable perforated appendicitis and probable underlying mass.  General surgery was consulted.  Patient was started on IV antibiotics.  IR was consulted but there was no abscess to be drained.  Patient continued to have abdominal pain.  She underwent diagnostic laparoscopy with lysis of adhesions and open right colectomy on 04/11/2018 by general surgery.   Assessment & Plan:   Principal Problem:   Acute perforated appendicitis Active Problems:   CKD (chronic kidney disease) stage 3, GFR 30-59 ml/min (HCC)   AKI (acute kidney injury) ([place])   HTN (hypertension)   Localized right colonic perforation causing abscess/mass -IR was consulted on admission but there was no abscess to be drained -Currently on cefepime and Flagyl -Repeat CT on 04/10/2018 did not show any abscess -Status post diagnostic laparoscopy with lysis of adhesions and open right colectomy on 04/11/2018 by general surgery.  Diet advancement as per general surgery.  Wound care as per general surgery.  Continue antibiotics.  Pain management  Leukocytosis -Probably reactive.  Monitor  Acute anemia -Probably from GI blood loss in the setting of perforated appendicitis -Hemoglobin stable.  Monitor  Acute kidney injury on chronic kidney disease stage III-IV -No evidence of nephrolithiasis or hydronephrosis on CT of the abdomen and pelvis.   -Improving.  Monitor -Home losartan/hydrochlorothiazide on hold  CAD with stent in RCA and history of CVA, history of PVD -Continue beta-blocker and statin.  Plavix held probably for surgery  Chronic diastolic CHF -Currently  compensated.  Monitor strict input and output.  Continue metoprolol.  Losartan/hydrochlorthiazide on hold.    COPD with tobacco abuse -Stable.  No wheezing or hypoxia  Hypothyroidism On Synthroid  Gout -Stable on allopurinol  Obesity -Outpatient follow-up  DVT prophylaxis: Lovenox Code Status: Full Family Communication: None at bedside Disposition Plan: Depends on clinical outcome  Consultants: General surgery/IR  Procedures: diagnostic laparoscopy with lysis of adhesions and open right colectomy on 04/11/2018 by general surgery  Antimicrobials: Cefepime and Flagyl from 04/05/2018 onwards   Subjective: Patient seen and examined at bedside.  Patient feels slightly better.  Denies worsening overnight abdominal pain, nausea or vomiting. Objective: Vitals:   04/11/18 1800 04/12/18 0015 04/12/18 0437 04/12/18 0923  BP: 128/70 130/75 114/67   Pulse: 89     Resp: (!) 26 20    Temp: 97.9 F (36.6 C) 97.7 F (36.5 C) 97.6 F (36.4 C) 97.9 F (36.6 C)  TempSrc:  Oral Oral Oral  SpO2: 95%     Weight:      Height:        Intake/Output Summary (Last 24 hours) at 04/12/2018 1022 Last data filed at 04/12/2018 0800 Gross per 24 hour  Intake 2770 ml  Output 1080 ml  Net 1690 ml   Filed Weights   04/09/18 0314  Weight: 92.5 kg    Examination:  General exam: Appears calm and comfortable, no distress  respiratory system: Bilateral decreased breath sounds at bases, some scattered crackles Cardiovascular system: S1 & S2 heard, Rate controlled Gastrointestinal system: Abdomen is nondistended, soft and mildly tender diffusely.  JP drain and wound VAC present.  Bowel sounds sluggish  extremities:  No cyanosis, edema    Data Reviewed: I have personally reviewed following labs and imaging studies  CBC: Recent Labs  Lab 04/07/18 0537 04/08/18 0419 04/09/18 0603 04/10/18 0521 04/11/18 0338 04/12/18 0351  WBC 9.3 10.3 7.7 6.8 6.7 14.6*  NEUTROABS 7.5  --  5.5 4.3  --   13.7*  HGB 10.2* 10.3* 9.9* 10.3* 9.7* 10.0*  HCT 30.4* 30.2* 29.4* 31.0* 30.1* 30.5*  MCV 82.6 83.0 83.1 83.3 82.2 84.0  PLT 290 292 290 278 350 355   Basic Metabolic Panel: Recent Labs  Lab 04/08/18 0419 04/09/18 0603 04/10/18 0521 04/11/18 0338 04/12/18 0351  NA 138 137 137 136 139  K 4.0 4.1 4.0 4.1 5.1  CL 107 107 107 104 106  CO2 20* 21* 23 23 22   GLUCOSE 99 93 103* 92 127*  BUN 16 13 14 10 15   CREATININE 1.59* 1.43* 1.36* 1.27* 1.49*  CALCIUM 8.7* 8.3* 8.5* 8.5* 8.7*  MG  --  1.6* 1.9 1.8 1.9   GFR: Estimated Creatinine Clearance: 31.1 mL/min (A) (by C-G formula based on SCr of 1.49 mg/dL (H)). Liver Function Tests: Recent Labs  Lab 04/05/18 1301 04/07/18 0537  AST 41 33  ALT 26 20  ALKPHOS 138* 105  BILITOT 1.3* 0.8  PROT 8.1 6.7  ALBUMIN 3.2* 2.4*   Recent Labs  Lab 04/05/18 1301  LIPASE 29   No results for input(s): AMMONIA in the last 168 hours. Coagulation Profile: Recent Labs  Lab 04/06/18 0749  INR 1.25   Cardiac Enzymes: No results for input(s): CKTOTAL, CKMB, CKMBINDEX, TROPONINI in the last 168 hours. BNP (last 3 results) No results for input(s): PROBNP in the last 8760 hours. HbA1C: No results for input(s): HGBA1C in the last 72 hours. CBG: No results for input(s): GLUCAP in the last 168 hours. Lipid Profile: No results for input(s): CHOL, HDL, LDLCALC, TRIG, CHOLHDL, LDLDIRECT in the last 72 hours. Thyroid Function Tests: No results for input(s): TSH, T4TOTAL, FREET4, T3FREE, THYROIDAB in the last 72 hours. Anemia Panel: No results for input(s): VITAMINB12, FOLATE, FERRITIN, TIBC, IRON, RETICCTPCT in the last 72 hours. Sepsis Labs: Recent Labs  Lab 04/05/18 2240 04/06/18 0059  LATICACIDVEN 1.1 0.7    Recent Results (from the past 240 hour(s))  Culture, Urine     Status: Abnormal   Collection Time: 04/05/18  8:02 PM  Result Value Ref Range Status   Specimen Description URINE, RANDOM  Final   Special Requests NONE  Final    Culture (A)  Final    <10,000 COLONIES/mL INSIGNIFICANT GROWTH Performed at [facility], [address], [place], [place] [address]    Report Status 04/07/2018 FINAL  Final  Surgical pcr screen     Status: None   Collection Time: 04/11/18 11:34 AM  Result Value Ref Range Status   MRSA, PCR NEGATIVE NEGATIVE Final   Staphylococcus aureus NEGATIVE NEGATIVE Final    Comment: (NOTE) The Xpert SA Assay (FDA approved for NASAL specimens in patients 57 years of age and older), is one component of a comprehensive surveillance program. It is not intended to diagnose infection nor to guide or monitor treatment. Performed at [facility], [place] [address] [place],  [address]          Radiology Studies: Ct Abdomen Pelvis Wo Contrast  Result Date: 04/10/2018 CLINICAL DATA:  77 year old female with appendicitis. Prior hysterectomy. EXAM: CT ABDOMEN AND PELVIS WITHOUT CONTRAST TECHNIQUE: Multidetector CT imaging of the abdomen and pelvis  was performed following the standard protocol without IV contrast. COMPARISON:  None. FINDINGS: Lower chest: Calcified right hilar lymph nodes consistent with prior granulomatous exposure. Heart size top-normal. Coronary artery calcifications. Hepatobiliary: Taking into account limitation by non contrast imaging, no worrisome hepatic lesion. Post cholecystectomy. Pancreas: Taking into account limitation by non contrast imaging, no pancreatic mass or inflammation. Spleen: Taking into account limitation by non contrast imaging, no worrisome splenic mass. Tiny calcified granulomas. No enlargement. Adrenals/Urinary Tract: Mild fullness renal pelvis bilaterally without obstructing stone noted. This may be related to full bladder. Taking into account limitation by non contrast imaging, no worrisome renal or bladder lesion. Hyperplasia adrenal glands larger on the left. Calcification left adrenal gland may indicate prior granulomatous involvement.  Stomach/Bowel: Retrocecal inflammatory process which may represent ruptured appendicitis with adjacent reactive lymph nodes spans over 6.2 x 5.2 x 6.1 cm versus prior 6 x 5.1 x 6 cm. No well-defined central abscess identified (evaluation limited without contrast, contrast not administered secondary to poor renal function). Interval increase in thickening of the cecal tip and terminal ileum consistent with progressive secondary inflammation of such. Underlying mass not excluded. Diverticulosis descending colon. Very small hiatal hernia. Decompressed stomach without gross abnormality. Vascular/Lymphatic: Atherosclerotic changes aorta and aortic branch vessels. No abdominal aortic aneurysm. Reproductive: Post hysterectomy. Right ovary in place. No worrisome adnexal mass noted. Other: No free air or bowel containing hernia. Mild diastases rectus muscles. Transverse colon extends to the undersurface of this diastasis where small fat and vessel containing hernia is noted. Musculoskeletal: Degenerative changes throughout the lower thoracic and lumbar spine. Hip joint degenerative changes. Sacroiliac joint degenerative changes. IMPRESSION: 1. Retrocecal inflammatory process which may represent ruptured appendicitis with adjacent reactive lymph nodes spans over 6.2 x 5.2 x 6.1 cm versus prior 6 x 5.1 x 6 cm. No well-defined central abscess identified (evaluation limited without contrast, contrast not administered secondary to poor renal function). Interval increase in thickening of the cecal tip and terminal ileum consistent with progressive secondary inflammation of such. Underlying mass not excluded. 2. Mild fullness renal pelvis bilaterally without obstructing stone noted. This may be related to full bladder. 3. Aortic Atherosclerosis (ICD10-I70.0). Coronary artery calcifications 4. Remainder of findings without change as detailed above. These results will be called to the ordering clinician or representative by the  Radiologist Assistant, and communication documented in the PACS or zVision Dashboard. Electronically Signed   By: Genia Del M.D.   On: 04/10/2018 15:01   Dg Chest Port 1 View  Result Date: 04/11/2018 CLINICAL DATA:  Central line placement EXAM: PORTABLE CHEST 1 VIEW COMPARISON:  February 01, 2018 FINDINGS: The heart size and mediastinal contours are stable. Right jugular central venous line is identified with distal tip in the superior vena cava/right atrial junction. There is no pneumothorax. Mild linear opacity is identified in the left lung base probably due to atelectasis. Minimal left pleural effusion is noted. The visualized skeletal structures are unremarkable. IMPRESSION: Right jugular central venous line, distal tip in the superior vena cava/right atrial junction. There is no pneumothorax. Mild atelectasis of left lung base with minimal left pleural effusion. Electronically Signed   By: Abelardo Diesel M.D.   On: 04/11/2018 17:26        Scheduled Meds: . Chlorhexidine Gluconate Cloth  6 each Topical Q0600  . enoxaparin (LOVENOX) injection  40 mg Subcutaneous Q24H  . gabapentin  100 mg Oral QHS  . levothyroxine  25 mcg Intravenous Daily  . metoprolol tartrate  12.5 mg  Oral BID  . ondansetron (ZOFRAN) IV  4 mg Intravenous Once  . oxybutynin  5 mg Oral BID  . sodium chloride flush  10-40 mL Intracatheter Q12H   Continuous Infusions: . sodium chloride    . acetaminophen    . ceFEPime (MAXIPIME) IV 1 g (04/11/18 2157)  . famotidine (PEPCID) IV    . metronidazole 500 mg (04/12/18 0644)     LOS: 7 days        Aline August, MD [facility] Pager [phone]  If 7PM-7AM, please contact night-coverage [email or web address] Password [email or web address] 04/12/2018, 10:22 AM

## 2018-04-12 NOTE — Plan of Care (Signed)
  Problem: Education: Goal: Knowledge of General Education information will improve Description Including pain rating scale, medication(s)/side effects and non-pharmacologic comfort measures Outcome: Progressing   Problem: Health Behavior/Discharge Planning: Goal: Ability to manage health-related needs will improve Outcome: Progressing   Problem: Clinical Measurements: Goal: Ability to maintain clinical measurements within normal limits will improve Outcome: Progressing Goal: Will remain free from infection Outcome: Progressing Goal: Diagnostic test results will improve Outcome: Progressing Goal: Respiratory complications will improve Outcome: Progressing Goal: Cardiovascular complication will be avoided Outcome: Progressing   Problem: Activity: Goal: Risk for activity intolerance will decrease Outcome: Progressing   Problem: Nutrition: Goal: Adequate nutrition will be maintained Outcome: Progressing   Problem: Coping: Goal: Level of anxiety will decrease Outcome: Progressing   Problem: Elimination: Goal: Will not experience complications related to bowel motility Outcome: Progressing Goal: Will not experience complications related to urinary retention Outcome: Progressing   Problem: Pain Managment: Goal: General experience of comfort will improve Outcome: Progressing   Problem: Safety: Goal: Ability to remain free from injury will improve Outcome: Progressing   Problem: Skin Integrity: Goal: Risk for impaired skin integrity will decrease Outcome: Progressing  Patient alert and oriented x 4, frequent complaints of pain to mid abdomen.  Wound Vac remains intact with no output.  JP drain intact with serosanguinous drainage to bulb.  Stable condition requiring frequent pain medication.  Will continue to monitor.

## 2018-04-12 NOTE — Anesthesia Postprocedure Evaluation (Signed)
Anesthesia Post Note  Patient: Joan Elliott  Procedure(s) Performed: LAPAROSCOPY DIAGNOSTIC (N/A Abdomen) COLECTOMY (N/A Abdomen) EXPLORATORY LAPAROTOMY (N/A Abdomen) LYSIS OF ADHESION (N/A Abdomen)     Patient location during evaluation: PACU Anesthesia Type: General Level of consciousness: awake and alert Pain management: pain level controlled Vital Signs Assessment: post-procedure vital signs reviewed and stable Respiratory status: spontaneous breathing, nonlabored ventilation, respiratory function stable and patient connected to nasal cannula oxygen Cardiovascular status: blood pressure returned to baseline and stable Postop Assessment: no apparent nausea or vomiting Anesthetic complications: no    Last Vitals:  Vitals:   04/12/18 0437 04/12/18 0923  BP: 114/67   Pulse:    Resp:    Temp: 36.4 C 36.6 C  SpO2:      Last Pain:  Vitals:   04/12/18 1628  TempSrc:   PainSc: [place] D [place]

## 2018-04-13 LAB — BASIC METABOLIC PANEL
ANION GAP: 7 (ref 5–15)
BUN: 17 mg/dL (ref 8–23)
CALCIUM: 8.2 mg/dL — AB (ref 8.9–10.3)
CHLORIDE: 110 mmol/L (ref 98–111)
CO2: 24 mmol/L (ref 22–32)
Creatinine, Ser: 1.44 mg/dL — ABNORMAL HIGH (ref 0.44–1.00)
GFR calc non Af Amer: 34 mL/min — ABNORMAL LOW (ref >60)
GFR, EST AFRICAN AMERICAN: 39 mL/min — AB (ref >60)
Glucose, Bld: 118 mg/dL — ABNORMAL HIGH (ref 70–99)
POTASSIUM: 4.2 mmol/L (ref 3.5–5.1)
Sodium: 141 mmol/L (ref 135–145)

## 2018-04-13 LAB — MAGNESIUM: Magnesium: 1.9 mg/dL (ref 1.7–2.4)

## 2018-04-13 LAB — CBC
HEMATOCRIT: 26.6 % — AB (ref 36.0–46.0)
Hemoglobin: 8.7 g/dL — ABNORMAL LOW (ref 12.0–15.0)
MCH: 27.6 pg (ref 26.0–34.0)
MCHC: 32.7 g/dL (ref 30.0–36.0)
MCV: 84.4 fL (ref 80.0–100.0)
NRBC: 0 % (ref 0.0–0.2)
Platelets: 370 10*3/uL (ref 150–400)
RBC: 3.15 MIL/uL — AB (ref 3.87–5.11)
RDW: 14.7 % (ref 11.5–15.5)
WBC: 11.5 10*3/uL — ABNORMAL HIGH (ref 4.0–10.5)

## 2018-04-13 MED ORDER — CODEINE SULFATE 15 MG PO TABS
30.0000 mg | ORAL_TABLET | Freq: Four times a day (QID) | ORAL | Status: DC | PRN
  Administered 2018-04-13 – 2018-04-15 (×6): 30 mg via ORAL
  Filled 2018-04-13 (×6): qty 2

## 2018-04-13 MED ORDER — ACETAMINOPHEN 500 MG PO TABS
1000.0000 mg | ORAL_TABLET | Freq: Three times a day (TID) | ORAL | Status: DC
  Administered 2018-04-13 – 2018-04-19 (×18): 1000 mg via ORAL
  Filled 2018-04-13 (×22): qty 2

## 2018-04-13 NOTE — Progress Notes (Addendum)
2 Days Post-Op    WU:[id number] appendix  Subjective: Was confused last PM and in Restraints, also received haldol.  Did well with sips and chips, some bowel sounds this AM. Almost nothing thru the drain and it is serosanguinous  Objective: Vital signs in last 24 hours: Temp:  [97.9 F (36.6 C)-99.4 F (37.4 C)] 99.4 F (37.4 C) (10/11 0648) Pulse Rate:  [73-82] 82 (10/11 0648) Resp:  [19-20] 20 (10/11 0648) BP: (113-118)/(64-75) 113/64 (10/11 0648) SpO2:  [91 %-95 %] 95 % (10/11 0648) Last BM Date: 04/09/18 240 PO 600 IV 1900 urine 40 drain Afebrile, VSS Creatinine is stable, WBC improving, H/H down some   Intake/Output from previous day: 10/10 0701 - 10/11 0700 In: 881.1 [P.O.:240; I.V.:361.1; IV Piggyback:250] Out: 1940 [Urine:1900; Drains:40] Intake/Output this shift: No intake/output data recorded.  General appearance: alert, cooperative, no distress and still very tender. Resp: clear anterior exam GI: soft, tender, not distended few BS, she isn't sure about flatus Media Information   Document Information   Photos    04/13/2018 08:19  Attached To:  Hospital Encounter on 04/10/18  Source Information   Earnstine Regal, PA-C  [facility]    Lab Results:  Recent Labs    04/12/18 0351 04/13/18 0327  WBC 14.6* 11.5*  HGB 10.0* 8.7*  HCT 30.5* 26.6*  PLT 394 370    BMET Recent Labs    04/12/18 0351 04/13/18 0327  NA 139 141  K 5.1 4.2  CL 106 110  CO2 22 24  GLUCOSE 127* 118*  BUN 15 17  CREATININE 1.49* 1.44*  CALCIUM 8.7* 8.2*   PT/INR No results for input(s): LABPROT, INR in the last 72 hours.  Recent Labs  Lab 04/07/18 0537  AST 33  ALT 20  ALKPHOS 105  BILITOT 0.8  PROT 6.7  ALBUMIN 2.4*     Lipase     Component Value Date/Time   LIPASE 29 04/05/2018 1301     Medications: . Chlorhexidine Gluconate Cloth  6 each Topical Q0600  . enoxaparin (LOVENOX) injection  40 mg Subcutaneous Q24H  . gabapentin  100 mg  Oral QHS  . levothyroxine  25 mcg Intravenous Daily  . metoprolol tartrate  12.5 mg Oral BID  . ondansetron (ZOFRAN) IV  4 mg Intravenous Once  . oxybutynin  5 mg Oral BID  . sodium chloride flush  10-40 mL Intracatheter Q12H    Assessment/Plan Hypertension Chronic kidney disease stage III/acute kidney injury Anemia CAD with history of stents Chronic diastolic congestive heart failure COPD with history of tobacco use Hx of CVA/PVD Hypothyroid Hx gout BMI 39.8  Localized right colonic perforation with mass Diagnostic laparoscopy with 2minutes of lysis of adhesions, right colectomy, placement of right retroperitoneal abscess drain, 04/11/2018 Dr. Rolm Bookbinder POD#2  - Pathology pending  FEN:  NPO/IV fluids ID:  Flagyl/Maxipime 10/3 =>> day 9/12 DVT:  Add Lovenox today/SCD Foley:  IN Follow up:  Dr. Donne Hazel  Plan:  OOB, continue her on sips of clears, I told staff to be more generous with sips if she is not nauseated.  Add some PO pain meds and see if this also helps.  DC foley/ OOB more PT, change wound vac today.   IS. She has Ultram and oxycodone listed as allergies, will try just plain Tylenol and codeine.       LOS: 8 days    Temperence Zenor 04/13/2018 [phone]

## 2018-04-13 NOTE — Care Management Note (Signed)
Case Management Note  Patient Details  Name: Joan Elliott MRN: [id number] Date of Birth: 07/15/1940  Subjective/Objective:      Admitted with probable perforated appendicitis and probable underlying mass. Resides alone. States PTA independent with ADL's.         - S/p lysis of adhesions and open right colectomy on 04/11/2018 by general surgery  Eddie Dibbles (Son) Darcell Eulas Post (Niece)    [phone]     PCP: Dr.E. Jeanie Cooks  Action/Plan:  PT evaluation pending post surgery.Marland KitchenMarland KitchenNCM following for TOC needs.   Pt selected [facility] for home health services if needed @ d/c.   Expected Discharge Date:                  Expected Discharge Plan:  [place]  In-House Referral:     Discharge planning Services  CM Consult  Post Acute Care Choice:    Choice offered to:  Patient  DME Arranged:    DME Agency:     HH Arranged:   RN, PT,OT,NA HH Agency:  [facility]  Status of Service:  In process, will continue to follow  If discussed at Long Length of Stay Meetings, dates discussed:    Additional Comments:  Sharin Mons, RN 04/13/2018, 1:44 PM

## 2018-04-13 NOTE — Consult Note (Signed)
[facility] Nurse wound consult note Patient's NPWT dressing changed in the presence of her surgical PA and MD.  No family present. One piece of black foam removed from the wound bed.  One piece of black foam placed.  Patient tolerated very well.  Immediate seal obtained. Reason for Consult: VAC dressing changed. Wound type: Surgical Wound bed: 100% pink granulation Drainage (amount, consistency, odor) Serosanginous Periwound: intact, normal color and texture Dressing procedure/placement/frequency: MWF VAC dressing change by primary RN team as it is a straight forward, uncomplicated dressing change. Monitor the wound area(s) for worsening of condition such as: Signs/symptoms of infection,  Increase in size,  Development of or worsening of odor, Development of pain, or increased pain at the affected locations.  Notify the medical team if any of these develop.  Thank you for the consult.  Discussed plan of care with the patient and bedside nurse.  [facility] nurse will not follow at this time.  Please re-consult the [facility] team if needed.  Val Riles, RN, MSN, CWOCN, CNS-BC, pager [phone]

## 2018-04-13 NOTE — Progress Notes (Signed)
Called to patients room around 2330 for confusion.  Upon arrival to room patient was stating "I'm getting out of here, yall are trying to hurt me, see this blood in this tube".  Attempted to re-orient patient to situation, however she remained confused.  She attempted to pull out right IJ, and became combative.  Hospitalist notified and ordered prn medication to help with confusion and combativeness. Patient continue to pull at lines once medication administered, orders received to apply soft wrist restraints.  Son called to inform him of patients confusion and encourage bedside support for a familiar face to help calm patient.  Son arrived to unit within hour and remained with patient for duration of shift.  Patient confusion begin to resolve around shift change.  Stable condition, will continue to monitor.

## 2018-04-13 NOTE — Progress Notes (Signed)
Patient ID: Joan Elliott, female   DOB: 06/20/1941, 77 y.o.   MRN: [id number]  PROGRESS NOTE    Joan Elliott  [id number] DOB: Aug 11, 1940 DOA: 04/05/2018 PCP: [name], MD   Brief Narrative:  77 year old female with history of chronic kidney disease stage III, hypertension, stroke, CAD presented on 04/05/2018 with abdominal pain.  She was admitted with probable perforated appendicitis and probable underlying mass.  General surgery was consulted.  Patient was started on IV antibiotics.  IR was consulted but there was no abscess to be drained.  Patient continued to have abdominal pain.  She underwent diagnostic laparoscopy with lysis of adhesions and open right colectomy on 04/11/2018 by general surgery.   Assessment & Plan:   Principal Problem:   Acute perforated appendicitis Active Problems:   CKD (chronic kidney disease) stage 3, GFR 30-59 ml/min (HCC)   AKI (acute kidney injury) ([place])   HTN (hypertension)   Localized right colonic perforation causing abscess/mass -IR was consulted on admission but there was no abscess to be drained -Currently on cefepime and Flagyl -Repeat CT on 04/10/2018 did not show any abscess -Status post diagnostic laparoscopy with lysis of adhesions and open right colectomy on 04/11/2018 by general surgery.  Diet advancement as per general surgery.  Wound care as per general surgery.  Continue antibiotics for a total of 7 days postop as per general surgery recommendations.  Pain management -Decrease IV fluids to 50 cc an hour  Leukocytosis -Probably reactive.  Improving.  Repeat a.m. labs  Acute anemia -Probably from GI blood loss in the setting of perforated appendicitis -Hemoglobin stable.  Monitor  Acute kidney injury on chronic kidney disease stage III-IV -No evidence of nephrolithiasis or hydronephrosis on CT of the abdomen and pelvis.   -Improving.  Monitor -Home losartan/hydrochlorothiazide on hold  CAD with stent in RCA and history of  CVA, history of PVD -Continue beta-blocker and statin.  Plavix held probably for surgery  Chronic diastolic CHF -Currently compensated.  Monitor strict input and output.  Continue metoprolol.  Losartan/hydrochlorthiazide on hold.    COPD with tobacco abuse -Stable.  No wheezing or hypoxia  Hypothyroidism On Synthroid  Gout -Stable on allopurinol  Obesity -Outpatient follow-up  DVT prophylaxis: Lovenox Code Status: Full Family Communication: None at bedside Disposition Plan: Depends on clinical outcome  Consultants: General surgery/IR  Procedures: diagnostic laparoscopy with lysis of adhesions and open right colectomy on 04/11/2018 by general surgery  Antimicrobials: Cefepime and Flagyl from 04/05/2018 onwards   Subjective: Patient seen and examined at bedside.  Nursing staff reports episodes of agitation overnight.  No overnight fever or vomiting.  No bowel movements yet.  She is sleepy, wakes up slightly and complains of abdominal pain.  Objective: Vitals:   04/12/18 0437 04/12/18 0923 04/12/18 2107 04/13/18 0648  BP: 114/67  118/75 113/64  Pulse:   73 82  Resp:   19 20  Temp: 97.6 F (36.4 C) 97.9 F (36.6 C) 98 F (36.7 C) 99.4 F (37.4 C)  TempSrc: Oral Oral    SpO2:   91% 95%  Weight:      Height:        Intake/Output Summary (Last 24 hours) at 04/13/2018 0953 Last data filed at 04/13/2018 0759 Gross per 24 hour  Intake 951.06 ml  Output 1490 ml  Net -538.94 ml   Filed Weights   04/09/18 0314  Weight: 92.5 kg    Examination:  General exam: Appears calm and comfortable, no acute distress.  Sleepy, wakes up slightly and complains of abdominal pain. respiratory system: Bilateral decreased breath sounds at bases, scattered crackles, no wheezing Cardiovascular system: S1 & S2 heard, Rate controlled Gastrointestinal system: Abdomen is nondistended, soft and mildly tender .  Bowel sounds sluggish.  JP drain and wound VAC present extremities: No cyanosis,  edema    Data Reviewed: I have personally reviewed following labs and imaging studies  CBC: Recent Labs  Lab 04/07/18 0537  04/09/18 0603 04/10/18 0521 04/11/18 0338 04/12/18 0351 04/13/18 0327  WBC 9.3   < > 7.7 6.8 6.7 14.6* 11.5*  NEUTROABS 7.5  --  5.5 4.3  --  13.7*  --   HGB 10.2*   < > 9.9* 10.3* 9.7* 10.0* 8.7*  HCT 30.4*   < > 29.4* 31.0* 30.1* 30.5* 26.6*  MCV 82.6   < > 83.1 83.3 82.2 84.0 84.4  PLT 290   < > 290 278 350 394 370   < > = values in this interval not displayed.   Basic Metabolic Panel: Recent Labs  Lab 04/09/18 0603 04/10/18 0521 04/11/18 0338 04/12/18 0351 04/13/18 0327  NA 137 137 136 139 141  K 4.1 4.0 4.1 5.1 4.2  CL 107 107 104 106 110  CO2 21* 23 23 22 24   GLUCOSE 93 103* 92 127* 118*  BUN 13 14 10 15 17   CREATININE 1.43* 1.36* 1.27* 1.49* 1.44*  CALCIUM 8.3* 8.5* 8.5* 8.7* 8.2*  MG 1.6* 1.9 1.8 1.9 1.9   GFR: Estimated Creatinine Clearance: 32.2 mL/min (A) (by C-G formula based on SCr of 1.44 mg/dL (H)). Liver Function Tests: Recent Labs  Lab 04/07/18 0537  AST 33  ALT 20  ALKPHOS 105  BILITOT 0.8  PROT 6.7  ALBUMIN 2.4*   No results for input(s): LIPASE, AMYLASE in the last 168 hours. No results for input(s): AMMONIA in the last 168 hours. Coagulation Profile: No results for input(s): INR, PROTIME in the last 168 hours. Cardiac Enzymes: No results for input(s): CKTOTAL, CKMB, CKMBINDEX, TROPONINI in the last 168 hours. BNP (last 3 results) No results for input(s): PROBNP in the last 8760 hours. HbA1C: No results for input(s): HGBA1C in the last 72 hours. CBG: No results for input(s): GLUCAP in the last 168 hours. Lipid Profile: No results for input(s): CHOL, HDL, LDLCALC, TRIG, CHOLHDL, LDLDIRECT in the last 72 hours. Thyroid Function Tests: No results for input(s): TSH, T4TOTAL, FREET4, T3FREE, THYROIDAB in the last 72 hours. Anemia Panel: No results for input(s): VITAMINB12, FOLATE, FERRITIN, TIBC, IRON,  RETICCTPCT in the last 72 hours. Sepsis Labs: No results for input(s): PROCALCITON, LATICACIDVEN in the last 168 hours.  Recent Results (from the past 240 hour(s))  Culture, Urine     Status: Abnormal   Collection Time: 04/05/18  8:02 PM  Result Value Ref Range Status   Specimen Description URINE, RANDOM  Final   Special Requests NONE  Final   Culture (A)  Final    <10,000 COLONIES/mL INSIGNIFICANT GROWTH Performed at [facility], [address], [place], [place] [address]    Report Status 04/07/2018 FINAL  Final  Surgical pcr screen     Status: None   Collection Time: 04/11/18 11:34 AM  Result Value Ref Range Status   MRSA, PCR NEGATIVE NEGATIVE Final   Staphylococcus aureus NEGATIVE NEGATIVE Final    Comment: (NOTE) The Xpert SA Assay (FDA approved for NASAL specimens in patients 58 years of age and older), is one component of a comprehensive  surveillance program. It is not intended to diagnose infection nor to guide or monitor treatment. Performed at [facility], [place] [address], [place], [place] [address]          Radiology Studies: Dg Chest Port 1 View  Result Date: 04/11/2018 CLINICAL DATA:  Central line placement EXAM: PORTABLE CHEST 1 VIEW COMPARISON:  February 01, 2018 FINDINGS: The heart size and mediastinal contours are stable. Right jugular central venous line is identified with distal tip in the superior vena cava/right atrial junction. There is no pneumothorax. Mild linear opacity is identified in the left lung base probably due to atelectasis. Minimal left pleural effusion is noted. The visualized skeletal structures are unremarkable. IMPRESSION: Right jugular central venous line, distal tip in the superior vena cava/right atrial junction. There is no pneumothorax. Mild atelectasis of left lung base with minimal left pleural effusion. Electronically Signed   By: Abelardo Diesel M.D.   On: 04/11/2018 17:26        Scheduled Meds: . acetaminophen   1,000 mg Oral Q8H  . Chlorhexidine Gluconate Cloth  6 each Topical Q0600  . enoxaparin (LOVENOX) injection  40 mg Subcutaneous Q24H  . gabapentin  100 mg Oral QHS  . levothyroxine  25 mcg Intravenous Daily  . metoprolol tartrate  12.5 mg Oral BID  . ondansetron (ZOFRAN) IV  4 mg Intravenous Once  . oxybutynin  5 mg Oral BID  . sodium chloride flush  10-40 mL Intracatheter Q12H   Continuous Infusions: . sodium chloride 100 mL/hr at 04/12/18 2202  . ceFEPime (MAXIPIME) IV Stopped (04/12/18 2215)  . famotidine (PEPCID) IV 20 mg (04/12/18 1244)  . methocarbamol (ROBAXIN) IV 500 mg (04/13/18 1007)  . metronidazole 500 mg (04/13/18 1219)     LOS: 8 days        Aline August, MD [facility] Pager [phone]  If 7PM-7AM, please contact night-coverage [email or web address] Password [email or web address] 04/13/2018, 9:53 AM

## 2018-04-13 NOTE — Progress Notes (Signed)
Physical Therapy Re-evaluation Patient Details Name: Joan Elliott MRN: [id number] DOB: 1941/02/18 Today's Date: 04/13/2018    History of Present Illness Pt is a 77 y/o female admitted secondry to worsen in RLQ pain. Thought to have acute perforated appendicitis with abscess, however, a mass has not been ruled out. IR denied drain placement per notes.OR 04/11/18 for colectomy and VAC placed.  PMH includes CKD and HTN.      PT Comments    Pt admitted with above diagnosis. Pt currently with functional limitations due to the deficits listed below (see PT Problem List). Pt limited eval as she only has been up to bathroom due to pain.  She is steady and should do well to go home with [facility] services.  Will follow.   Pt will benefit from skilled PT to increase their independence and safety with mobility to allow discharge to the venue listed below.    Follow Up Recommendations  Home health PT;Supervision for mobility/OOB     Equipment Recommendations       Recommendations for Other Services OT consult     Precautions / Restrictions Precautions Precautions: None Restrictions Weight Bearing Restrictions: No    Mobility  Bed Mobility Overal bed mobility: Needs Assistance Bed Mobility: Supine to Sit     Supine to sit: Min assist     General bed mobility comments: using bed rail to roll onto R side and then able to push up with Elbow, painful  Transfers Overall transfer level: Needs assistance Equipment used: Rolling walker (2 wheeled) Transfers: Sit to/from Stand Sit to Stand: Supervision            Ambulation/Gait Ambulation/Gait assistance: Min guard Gait Distance (Feet): 20 Feet Assistive device: Rolling walker (2 wheeled) Gait Pattern/deviations: Step-through pattern;Decreased stride length Gait velocity: Decreased  Gait velocity interpretation: <1.31 ft/sec, indicative of household ambulator General Gait Details: Slow, cautious gait. only ambulated to bathroom     Stairs             Wheelchair Mobility    Modified Rankin (Stroke Patients Only)       Balance Overall balance assessment: Needs assistance Sitting-balance support: No upper extremity supported;Feet supported Sitting balance-Leahy Scale: Normal     Standing balance support: Bilateral upper extremity supported;During functional activity Standing balance-Leahy Scale: Poor Standing balance comment: requires UE support                             Cognition Arousal/Alertness: Lethargic;Suspect due to medications Behavior During Therapy: WFL for tasks assessed/performed Overall Cognitive Status: Within Functional Limits for tasks assessed                                        Exercises      General Comments        Pertinent Vitals/Pain Pain Assessment: 0-10 Pain Score: 7  Pain Location: lower R abdomen  Pain Descriptors / Indicators: Aching Pain Intervention(s): Limited activity within patient's tolerance;Monitored during session;Premedicated before session;Repositioned    Home Living                      Prior Function            PT Goals (current goals can now be found in the care plan section) Progress towards PT goals: Progressing toward goals    Frequency  Min 3X/week      PT Plan Current plan remains appropriate    Co-evaluation              AM-PAC PT "6 Clicks" Daily Activity  Outcome Measure  Difficulty turning over in bed (including adjusting bedclothes, sheets and blankets)?: A Little Difficulty moving from lying on back to sitting on the side of the bed? : A Little Difficulty sitting down on and standing up from a chair with arms (e.g., wheelchair, bedside commode, etc,.)?: A Little Help needed moving to and from a bed to chair (including a wheelchair)?: A Little Help needed walking in hospital room?: A Little Help needed climbing 3-5 steps with a railing? : A Lot 6 Click Score: 17     End of Session Equipment Utilized During Treatment: Gait belt Activity Tolerance: Patient limited by fatigue;Patient limited by lethargy Patient left: in bed;with call bell/phone within reach;with family/visitor present(sitting EOB ) Nurse Communication: Mobility status PT Visit Diagnosis: Muscle weakness (generalized) (M62.81);Other abnormalities of gait and mobility (R26.89);Pain     Time: 2725-3664 PT Time Calculation (min) (ACUTE ONLY): 9 min  Charges:                        [place] Pager:  [phone]  Office:  [place] 04/13/2018, 3:59 PM

## 2018-04-14 LAB — CBC WITH DIFFERENTIAL/PLATELET
ABS IMMATURE GRANULOCYTES: 0.03 10*3/uL (ref 0.00–0.07)
BASOS PCT: 0 %
Basophils Absolute: 0 10*3/uL (ref 0.0–0.1)
EOS ABS: 0.2 10*3/uL (ref 0.0–0.5)
Eosinophils Relative: 2 %
HEMATOCRIT: 26.1 % — AB (ref 36.0–46.0)
Hemoglobin: 8.1 g/dL — ABNORMAL LOW (ref 12.0–15.0)
IMMATURE GRANULOCYTES: 0 %
LYMPHS ABS: 1.4 10*3/uL (ref 0.7–4.0)
Lymphocytes Relative: 17 %
MCH: 26.6 pg (ref 26.0–34.0)
MCHC: 31 g/dL (ref 30.0–36.0)
MCV: 85.9 fL (ref 80.0–100.0)
MONO ABS: 0.5 10*3/uL (ref 0.1–1.0)
MONOS PCT: 6 %
NEUTROS ABS: 5.9 10*3/uL (ref 1.7–7.7)
NEUTROS PCT: 75 %
Platelets: 362 10*3/uL (ref 150–400)
RBC: 3.04 MIL/uL — ABNORMAL LOW (ref 3.87–5.11)
RDW: 15 % (ref 11.5–15.5)
WBC: 7.9 10*3/uL (ref 4.0–10.5)
nRBC: 0 % (ref 0.0–0.2)

## 2018-04-14 LAB — BASIC METABOLIC PANEL
ANION GAP: 7 (ref 5–15)
BUN: 9 mg/dL (ref 8–23)
CALCIUM: 8 mg/dL — AB (ref 8.9–10.3)
CO2: 23 mmol/L (ref 22–32)
Chloride: 111 mmol/L (ref 98–111)
Creatinine, Ser: 1.19 mg/dL — ABNORMAL HIGH (ref 0.44–1.00)
GFR calc Af Amer: 49 mL/min — ABNORMAL LOW (ref >60)
GFR, EST NON AFRICAN AMERICAN: 42 mL/min — AB (ref >60)
GLUCOSE: 92 mg/dL (ref 70–99)
Potassium: 3.9 mmol/L (ref 3.5–5.1)
Sodium: 141 mmol/L (ref 135–145)

## 2018-04-14 LAB — MAGNESIUM: MAGNESIUM: 1.8 mg/dL (ref 1.7–2.4)

## 2018-04-14 MED ORDER — METOPROLOL TARTRATE 12.5 MG HALF TABLET
12.5000 mg | ORAL_TABLET | Freq: Two times a day (BID) | ORAL | Status: DC
  Administered 2018-04-14 – 2018-04-24 (×19): 12.5 mg via ORAL
  Filled 2018-04-14 (×20): qty 1

## 2018-04-14 MED ORDER — FAMOTIDINE IN NACL 20-0.9 MG/50ML-% IV SOLN: 20.0000 mg | Freq: Two times a day (BID) | INTRAVENOUS | Status: DC

## 2018-04-14 MED ORDER — LEVOTHYROXINE SODIUM 50 MCG PO TABS
50.0000 ug | ORAL_TABLET | Freq: Every day | ORAL | Status: DC
  Administered 2018-04-15 – 2018-04-24 (×10): 50 ug via ORAL
  Filled 2018-04-14 (×10): qty 1

## 2018-04-14 MED ORDER — METOPROLOL TARTRATE 5 MG/5ML IV SOLN
2.5000 mg | Freq: Four times a day (QID) | INTRAVENOUS | Status: DC
  Administered 2018-04-14: 2.5 mg via INTRAVENOUS
  Filled 2018-04-14: qty 5

## 2018-04-14 MED ORDER — METHOCARBAMOL 1000 MG/10ML IJ SOLN: 500.0000 mg | Freq: Three times a day (TID) | INTRAVENOUS | Status: DC

## 2018-04-14 MED ORDER — LEVOTHYROXINE SODIUM 100 MCG IV SOLR: 25.0000 ug | Freq: Every day | INTRAVENOUS | Status: DC

## 2018-04-14 MED ORDER — METOPROLOL TARTRATE 5 MG/5ML IV SOLN: 2.5000 mg | Freq: Four times a day (QID) | INTRAVENOUS | Status: DC | PRN

## 2018-04-14 NOTE — Progress Notes (Signed)
Patient ID: Joan Elliott, female   DOB: March 26, 1941, 77 y.o.   MRN: [id number]  PROGRESS NOTE    Joan Elliott  [id number] DOB: 1941-02-09 DOA: 04/05/2018 PCP: [name], MD   Brief Narrative:  77 year old female with history of chronic kidney disease stage III, hypertension, stroke, CAD presented on 04/05/2018 with abdominal pain.  She was admitted with probable perforated appendicitis and probable underlying mass.  General surgery was consulted.  Patient was started on IV antibiotics.  IR was consulted but there was no abscess to be drained.  Patient continued to have abdominal pain.  She underwent diagnostic laparoscopy with lysis of adhesions and open right colectomy on 04/11/2018 by general surgery.   Assessment & Plan:   Principal Problem:   Acute perforated appendicitis Active Problems:   CKD (chronic kidney disease) stage 3, GFR 30-59 ml/min (HCC)   AKI (acute kidney injury) ([place])   HTN (hypertension)   Localized right colonic perforation causing abscess/mass -IR was consulted on admission but there was no abscess to be drained -Currently on cefepime and Flagyl -Repeat CT on 04/10/2018 did not show any abscess -Status post diagnostic laparoscopy with lysis of adhesions and open right colectomy on 04/11/2018 by general surgery.  Diet advancement as per general surgery.  Wound care as per general surgery.  Continue antibiotics for a total of 7 days postop as per general surgery recommendations.  Pain management -Continue IV fluids.  Leukocytosis -Probably reactive.  Resolved.  Repeat a.m. labs  Acute anemia -Probably from GI blood loss in the setting of perforated appendicitis -Hemoglobin stable.  Monitor  Acute kidney injury on chronic kidney disease stage III-IV -No evidence of nephrolithiasis or hydronephrosis on CT of the abdomen and pelvis.   -Improving.  Monitor -Home losartan/hydrochlorothiazide on hold  CAD with stent in RCA and history of CVA, history of  PVD -Continue beta-blocker and statin.  Plavix held probably for surgery  Chronic diastolic CHF -Currently compensated.  Monitor strict input and output.  Continue metoprolol.  Losartan/hydrochlorthiazide on hold.    COPD with tobacco abuse -Stable.  No wheezing or hypoxia  Hypothyroidism On Synthroid  Gout -Stable on allopurinol  Obesity -Outpatient follow-up  DVT prophylaxis: Lovenox Code Status: Full Family Communication: None at bedside Disposition Plan: Depends on clinical outcome  Consultants: General surgery/IR  Procedures: diagnostic laparoscopy with lysis of adhesions and open right colectomy on 04/11/2018 by general surgery  Antimicrobials: Cefepime and Flagyl from 04/05/2018 onwards   Subjective: Patient seen and examined at bedside.  Nursing staff reports no episodes of agitation overnight but intermittent confusion.  No overnight fever or vomiting.  She is sleepy, hardly wakes up.  Objective: Vitals:   04/13/18 0648 04/13/18 1311 04/13/18 2108 04/14/18 0624  BP: 113/64 118/65 (!) 144/69 139/64  Pulse: 82 72 76 65  Resp: 20 16 19    Temp: 99.4 F (37.4 C) 98.6 F (37 C) 98.4 F (36.9 C) 98.6 F (37 C)  TempSrc:  Oral    SpO2: 95% 97% 99% 99%  Weight:      Height:        Intake/Output Summary (Last 24 hours) at 04/14/2018 0808 Last data filed at 04/14/2018 0433 Gross per 24 hour  Intake 2300 ml  Output 1375 ml  Net 925 ml   Filed Weights   04/09/18 0314  Weight: 92.5 kg    Examination:  General exam: Appears calm and comfortable, no distress.  Sleepy respiratory system: Bilateral decreased breath sounds at bases, scattered crackles  Cardiovascular system: Rate controlled, S1-S2 heard  gastrointestinal system: Abdomen is nondistended, soft and mildly tender .  Bowel sounds sluggish.  JP drain and wound VAC present extremities: No cyanosis, edema    Data Reviewed: I have personally reviewed following labs and imaging studies  CBC: Recent  Labs  Lab 04/09/18 0603 04/10/18 0521 04/11/18 0338 04/12/18 0351 04/13/18 0327 04/14/18 0419  WBC 7.7 6.8 6.7 14.6* 11.5* 7.9  NEUTROABS 5.5 4.3  --  13.7*  --  5.9  HGB 9.9* 10.3* 9.7* 10.0* 8.7* 8.1*  HCT 29.4* 31.0* 30.1* 30.5* 26.6* 26.1*  MCV 83.1 83.3 82.2 84.0 84.4 85.9  PLT 290 278 350 394 370 300   Basic Metabolic Panel: Recent Labs  Lab 04/10/18 0521 04/11/18 0338 04/12/18 0351 04/13/18 0327 04/14/18 0419  NA 137 136 139 141 141  K 4.0 4.1 5.1 4.2 3.9  CL 107 104 106 110 111  CO2 23 23 22 24 23   GLUCOSE 103* 92 127* 118* 92  BUN 14 10 15 17 9   CREATININE 1.36* 1.27* 1.49* 1.44* 1.19*  CALCIUM 8.5* 8.5* 8.7* 8.2* 8.0*  MG 1.9 1.8 1.9 1.9 1.8   GFR: Estimated Creatinine Clearance: 38.9 mL/min (A) (by C-G formula based on SCr of 1.19 mg/dL (H)). Liver Function Tests: No results for input(s): AST, ALT, ALKPHOS, BILITOT, PROT, ALBUMIN in the last 168 hours. No results for input(s): LIPASE, AMYLASE in the last 168 hours. No results for input(s): AMMONIA in the last 168 hours. Coagulation Profile: No results for input(s): INR, PROTIME in the last 168 hours. Cardiac Enzymes: No results for input(s): CKTOTAL, CKMB, CKMBINDEX, TROPONINI in the last 168 hours. BNP (last 3 results) No results for input(s): PROBNP in the last 8760 hours. HbA1C: No results for input(s): HGBA1C in the last 72 hours. CBG: No results for input(s): GLUCAP in the last 168 hours. Lipid Profile: No results for input(s): CHOL, HDL, LDLCALC, TRIG, CHOLHDL, LDLDIRECT in the last 72 hours. Thyroid Function Tests: No results for input(s): TSH, T4TOTAL, FREET4, T3FREE, THYROIDAB in the last 72 hours. Anemia Panel: No results for input(s): VITAMINB12, FOLATE, FERRITIN, TIBC, IRON, RETICCTPCT in the last 72 hours. Sepsis Labs: No results for input(s): PROCALCITON, LATICACIDVEN in the last 168 hours.  Recent Results (from the past 240 hour(s))  Culture, Urine     Status: Abnormal   Collection  Time: 04/05/18  8:02 PM  Result Value Ref Range Status   Specimen Description URINE, RANDOM  Final   Special Requests NONE  Final   Culture (A)  Final    <10,000 COLONIES/mL INSIGNIFICANT GROWTH Performed at [facility], [address] [place], [place] [address]    Report Status 04/07/2018 FINAL  Final  Surgical pcr screen     Status: None   Collection Time: 04/11/18 11:34 AM  Result Value Ref Range Status   MRSA, PCR NEGATIVE NEGATIVE Final   Staphylococcus aureus NEGATIVE NEGATIVE Final    Comment: (NOTE) The Xpert SA Assay (FDA approved for NASAL specimens in patients 84 years of age and older), is one component of a comprehensive surveillance program. It is not intended to diagnose infection nor to guide or monitor treatment. Performed at [facility], [place] [address], [place], [place] [address]          Radiology Studies: No results found.      Scheduled Meds: . acetaminophen  1,000 mg Oral Q8H  . Chlorhexidine Gluconate Cloth  6 each Topical Q0600  .  enoxaparin (LOVENOX) injection  40 mg Subcutaneous Q24H  . gabapentin  100 mg Oral QHS  . levothyroxine  25 mcg Intravenous Daily  . metoprolol tartrate  12.5 mg Oral BID  . ondansetron (ZOFRAN) IV  4 mg Intravenous Once  . oxybutynin  5 mg Oral BID  . sodium chloride flush  10-40 mL Intracatheter Q12H   Continuous Infusions: . sodium chloride 50 mL/hr at 04/14/18 0013  . ceFEPime (MAXIPIME) IV 1 g (04/13/18 2019)  . famotidine (PEPCID) IV 20 mg (04/13/18 1053)  . methocarbamol (ROBAXIN) IV 500 mg (04/14/18 0612)  . metronidazole 500 mg (04/14/18 1194)     LOS: 9 days        Aline August, MD [facility] Pager [phone]  If 7PM-7AM, please contact night-coverage [email or web address] Password [email or web address] 04/14/2018, 8:08 AM

## 2018-04-14 NOTE — Progress Notes (Signed)
[facility] Progress Note  3 Days Post-Op  Subjective: CC-  Patient sitting up in chair. Continues to have some abdominal pain, worse after moving around. Mild nausea last night, no emesis. Denies nausea this morning. No flatus or BM. Tolerating sips of clears and oral meds.  Objective: Vital signs in last 24 hours: Temp:  [98.4 F (36.9 C)-98.6 F (37 C)] 98.6 F (37 C) (10/12 0624) Pulse Rate:  [65-76] 65 (10/12 0624) Resp:  [16-19] 19 (10/11 2108) BP: (118-144)/(64-69) 139/64 (10/12 0624) SpO2:  [97 %-99 %] 99 % (10/12 0624) Last BM Date: 04/09/18  Intake/Output from previous day: 10/11 0701 - 10/12 0700 In: 2400 [P.O.:1140; I.V.:1010; IV Piggyback:250] Out: 6063 [Urine:1300; Drains:75] Intake/Output this shift: No intake/output data recorded.  PE: Gen:  Alert, NAD, pleasant HEENT: EOM's intact, pupils equal and round Card:  RRR Pulm:  CTAB, no W/R/R, effort normal Abd: obese, soft, ND, +BS, vac to midline incision, mild lower abdominal TTP, JP drain with SS fluid Ext:  Calves soft and nontender Skin: no rashes noted, warm and dry  Lab Results:  Recent Labs    04/13/18 0327 04/14/18 0419  WBC 11.5* 7.9  HGB 8.7* 8.1*  HCT 26.6* 26.1*  PLT 370 362   BMET Recent Labs    04/13/18 0327 04/14/18 0419  NA 141 141  K 4.2 3.9  CL 110 111  CO2 24 23  GLUCOSE 118* 92  BUN 17 9  CREATININE 1.44* 1.19*  CALCIUM 8.2* 8.0*   PT/INR No results for input(s): LABPROT, INR in the last 72 hours. CMP     Component Value Date/Time   NA 141 04/14/2018 0419   K 3.9 04/14/2018 0419   CL 111 04/14/2018 0419   CO2 23 04/14/2018 0419   GLUCOSE 92 04/14/2018 0419   BUN 9 04/14/2018 0419   CREATININE 1.19 (H) 04/14/2018 0419   CALCIUM 8.0 (L) 04/14/2018 0419   PROT 6.7 04/07/2018 0537   ALBUMIN 2.4 (L) 04/07/2018 0537   AST 33 04/07/2018 0537   ALT 20 04/07/2018 0537   ALKPHOS 105 04/07/2018 0537   BILITOT 0.8 04/07/2018 0537   GFRNONAA 42 (L)  04/14/2018 0419   GFRAA 49 (L) 04/14/2018 0419   Lipase     Component Value Date/Time   LIPASE 29 04/05/2018 1301       Studies/Results: No results found.  Anti-infectives: Anti-infectives (From admission, onward)   Start     Dose/Rate Route Frequency Ordered Stop   04/06/18 2000  ceFEPIme (MAXIPIME) 1 g in sodium chloride 0.9 % 100 mL IVPB     1 g 200 mL/hr over 30 Minutes Intravenous Every 24 hours 04/05/18 2005     04/06/18 0600  metroNIDAZOLE (FLAGYL) IVPB 500 mg     500 mg 100 mL/hr over 60 Minutes Intravenous Every 8 hours 04/05/18 1957     04/05/18 1930  ceFEPIme (MAXIPIME) 2 g in sodium chloride 0.9 % 100 mL IVPB     2 g 200 mL/hr over 30 Minutes Intravenous  Once 04/05/18 1921 04/06/18 0104   04/05/18 1930  metroNIDAZOLE (FLAGYL) IVPB 500 mg     500 mg 100 mL/hr over 60 Minutes Intravenous  Once 04/05/18 1921 04/05/18 2236       Assessment/Plan Hypertension Chronic kidney disease stage III/acute kidney injury Anemia CAD with history of stents Chronic diastolic congestive heart failure COPD with history of tobacco use Hxof CVA/PVD Hypothyroid Hx gout BMI 39.8  Localized right colonic perforation with mass  Diagnostic laparoscopy with 30minutes of lysis of adhesions, right colectomy, placement of right retroperitoneal abscess drain,04/11/2018 Dr. Rolm Bookbinder  - POD#3 - Pathology pending - vac changes MWF - no flatus or BM  FEN: IVF, sips of clears ID: Flagyl/Maxipime 10/3 =>>day# 10/12 DVT: Lovenox today/SCD Foley:  wick Follow up: Dr. Donne Hazel  Plan:  Continue mobilization/OOB. Continue sips of clears as tolerated and await return in bowel function.   LOS: 9 days    Wellington [place] , [facility] 04/14/2018, 9:08 AM Pager: [phone] Mon 7:00 am -11:30 AM Tues-Fri 7:00 am-4:30 pm Sat-Sun 7:00 am-11:30 am

## 2018-04-15 LAB — CBC WITH DIFFERENTIAL/PLATELET
Abs Immature Granulocytes: 0.05 10*3/uL (ref 0.00–0.07)
Basophils Absolute: 0 10*3/uL (ref 0.0–0.1)
Basophils Relative: 0 %
EOS ABS: 0.2 10*3/uL (ref 0.0–0.5)
EOS PCT: 3 %
HEMATOCRIT: 26.8 % — AB (ref 36.0–46.0)
HEMOGLOBIN: 8.6 g/dL — AB (ref 12.0–15.0)
Immature Granulocytes: 1 %
LYMPHS ABS: 1.2 10*3/uL (ref 0.7–4.0)
Lymphocytes Relative: 16 %
MCH: 27.3 pg (ref 26.0–34.0)
MCHC: 32.1 g/dL (ref 30.0–36.0)
MCV: 85.1 fL (ref 80.0–100.0)
MONO ABS: 0.5 10*3/uL (ref 0.1–1.0)
Monocytes Relative: 6 %
NEUTROS ABS: 5.7 10*3/uL (ref 1.7–7.7)
Neutrophils Relative %: 74 %
Platelets: 398 10*3/uL (ref 150–400)
RBC: 3.15 MIL/uL — AB (ref 3.87–5.11)
RDW: 15 % (ref 11.5–15.5)
WBC: 7.6 10*3/uL (ref 4.0–10.5)
nRBC: 0 % (ref 0.0–0.2)

## 2018-04-15 LAB — BASIC METABOLIC PANEL
Anion gap: 6 (ref 5–15)
BUN: 5 mg/dL — AB (ref 8–23)
CHLORIDE: 111 mmol/L (ref 98–111)
CO2: 22 mmol/L (ref 22–32)
CREATININE: 1.12 mg/dL — AB (ref 0.44–1.00)
Calcium: 8 mg/dL — ABNORMAL LOW (ref 8.9–10.3)
GFR calc Af Amer: 53 mL/min — ABNORMAL LOW (ref >60)
GFR, EST NON AFRICAN AMERICAN: 45 mL/min — AB (ref >60)
GLUCOSE: 94 mg/dL (ref 70–99)
POTASSIUM: 3.7 mmol/L (ref 3.5–5.1)
Sodium: 139 mmol/L (ref 135–145)

## 2018-04-15 LAB — MAGNESIUM: Magnesium: 1.8 mg/dL (ref 1.7–2.4)

## 2018-04-15 NOTE — Progress Notes (Signed)
Patient ID: Joan Elliott, female   DOB: 1941-03-09, 77 y.o.   MRN: [id number]  PROGRESS NOTE    Joan Elliott  WOE:[id number] DOB: 1940/08/12 DOA: 04/05/2018 PCP: [name], MD   Brief Narrative:  77 year old female with history of chronic kidney disease stage III, hypertension, stroke, CAD presented on 04/05/2018 with abdominal pain.  She was admitted with probable perforated appendicitis and probable underlying mass.  General surgery was consulted.  Patient was started on IV antibiotics.  IR was consulted but there was no abscess to be drained.  Patient continued to have abdominal pain.  She underwent diagnostic laparoscopy with lysis of adhesions and open right colectomy on 04/11/2018 by general surgery.   Assessment & Plan:   Principal Problem:   Acute perforated appendicitis Active Problems:   CKD (chronic kidney disease) stage 3, GFR 30-59 ml/min (HCC)   AKI (acute kidney injury) ([place])   HTN (hypertension)   Localized right colonic perforation causing abscess/mass -Status post diagnostic laparoscopy with lysis of adhesions and open right colectomy on 04/11/2018 by general surgery.  Pathology pending.  Diet advancement as per general surgery.  Wound care as per general surgery.  Continue cefepime and Flagyl for a total of 7 days postop as per general surgery recommendations.  Pain management.  No bowel movements yet. -Continue IV fluids.  Leukocytosis -Probably reactive.  Resolved.  Repeat a.m. labs  Acute anemia -Probably from GI blood loss in the setting of perforated appendicitis -Hemoglobin stable.  Monitor  Acute kidney injury on chronic kidney disease stage III-IV -No evidence of nephrolithiasis or hydronephrosis on CT of the abdomen and pelvis.   -Improved.  Monitor -Home losartan/hydrochlorothiazide on hold  CAD with stent in RCA and history of CVA, history of PVD -Continue beta-blocker and statin.  Plavix held probably for surgery  Chronic diastolic  CHF -Currently compensated.  Monitor strict input and output.  Continue metoprolol.  Losartan/hydrochlorthiazide on hold.    COPD with tobacco abuse -Stable.  No wheezing or hypoxia  Hypothyroidism On Synthroid  Gout -Stable on allopurinol  Obesity -Outpatient follow-up  DVT prophylaxis: Lovenox Code Status: Full Family Communication: None at bedside Disposition Plan: Depends on clinical outcome  Consultants: General surgery/IR  Procedures: diagnostic laparoscopy with lysis of adhesions and open right colectomy on 04/11/2018 by general surgery  Antimicrobials: Cefepime and Flagyl from 04/05/2018 onwards   Subjective: Patient seen and examined at bedside.  Patient is awake, complains of abdominal pain and nausea.  No bowel movements yet.  No overnight fever or vomiting.  Objective: Vitals:   04/14/18 2046 04/15/18 0619 04/15/18 0628 04/15/18 0753  BP:   (!) 149/76 138/69  Pulse:   65 64  Resp:   18   Temp: 98.6 F (37 C)  97.8 F (36.6 C)   TempSrc:   Oral   SpO2:   99% 98%  Weight:  96.7 kg    Height:        Intake/Output Summary (Last 24 hours) at 04/15/2018 0909 Last data filed at 04/15/2018 0811 Gross per 24 hour  Intake 4598.24 ml  Output 3520 ml  Net 1078.24 ml   Filed Weights   04/09/18 0314 04/15/18 0619  Weight: 92.5 kg 96.7 kg    Examination:  General exam: Appears calm and comfortable, no distress.  Awake and alert respiratory system: Bilateral decreased breath sounds at bases, scattered crackles, no wheezing Cardiovascular system: S1-S2 heard, rate controlled gastrointestinal system: Abdomen is nondistended, soft and mildly tender diffusely.  Bowel  sounds sluggish.  JP drain and wound VAC present extremities: No cyanosis, edema    Data Reviewed: I have personally reviewed following labs and imaging studies  CBC: Recent Labs  Lab 04/09/18 0603 04/10/18 0521 04/11/18 0338 04/12/18 0351 04/13/18 0327 04/14/18 0419 04/15/18 0401  WBC  7.7 6.8 6.7 14.6* 11.5* 7.9 7.6  NEUTROABS 5.5 4.3  --  13.7*  --  5.9 5.7  HGB 9.9* 10.3* 9.7* 10.0* 8.7* 8.1* 8.6*  HCT 29.4* 31.0* 30.1* 30.5* 26.6* 26.1* 26.8*  MCV 83.1 83.3 82.2 84.0 84.4 85.9 85.1  PLT 290 278 350 394 370 362 124   Basic Metabolic Panel: Recent Labs  Lab 04/11/18 0338 04/12/18 0351 04/13/18 0327 04/14/18 0419 04/15/18 0401  NA 136 139 141 141 139  K 4.1 5.1 4.2 3.9 3.7  CL 104 106 110 111 111  CO2 23 22 24 23 22   GLUCOSE 92 127* 118* 92 94  BUN 10 15 17 9  5*  CREATININE 1.27* 1.49* 1.44* 1.19* 1.12*  CALCIUM 8.5* 8.7* 8.2* 8.0* 8.0*  MG 1.8 1.9 1.9 1.8 1.8   GFR: Estimated Creatinine Clearance: 42.4 mL/min (A) (by C-G formula based on SCr of 1.12 mg/dL (H)). Liver Function Tests: No results for input(s): AST, ALT, ALKPHOS, BILITOT, PROT, ALBUMIN in the last 168 hours. No results for input(s): LIPASE, AMYLASE in the last 168 hours. No results for input(s): AMMONIA in the last 168 hours. Coagulation Profile: No results for input(s): INR, PROTIME in the last 168 hours. Cardiac Enzymes: No results for input(s): CKTOTAL, CKMB, CKMBINDEX, TROPONINI in the last 168 hours. BNP (last 3 results) No results for input(s): PROBNP in the last 8760 hours. HbA1C: No results for input(s): HGBA1C in the last 72 hours. CBG: No results for input(s): GLUCAP in the last 168 hours. Lipid Profile: No results for input(s): CHOL, HDL, LDLCALC, TRIG, CHOLHDL, LDLDIRECT in the last 72 hours. Thyroid Function Tests: No results for input(s): TSH, T4TOTAL, FREET4, T3FREE, THYROIDAB in the last 72 hours. Anemia Panel: No results for input(s): VITAMINB12, FOLATE, FERRITIN, TIBC, IRON, RETICCTPCT in the last 72 hours. Sepsis Labs: No results for input(s): PROCALCITON, LATICACIDVEN in the last 168 hours.  Recent Results (from the past 240 hour(s))  Culture, Urine     Status: Abnormal   Collection Time: 04/05/18  8:02 PM  Result Value Ref Range Status   Specimen Description  URINE, RANDOM  Final   Special Requests NONE  Final   Culture (A)  Final    <10,000 COLONIES/mL INSIGNIFICANT GROWTH Performed at [facility], [address], [place], [place] [address]    Report Status 04/07/2018 FINAL  Final  Surgical pcr screen     Status: None   Collection Time: 04/11/18 11:34 AM  Result Value Ref Range Status   MRSA, PCR NEGATIVE NEGATIVE Final   Staphylococcus aureus NEGATIVE NEGATIVE Final    Comment: (NOTE) The Xpert SA Assay (FDA approved for NASAL specimens in patients 5 years of age and older), is one component of a comprehensive surveillance program. It is not intended to diagnose infection nor to guide or monitor treatment. Performed at [facility], [place] [address], [place], [place] [address]          Radiology Studies: No results found.      Scheduled Meds: . acetaminophen  1,000 mg Oral Q8H  . Chlorhexidine Gluconate Cloth  6 each Topical Q0600  . enoxaparin (LOVENOX) injection  40 mg Subcutaneous Q24H  . gabapentin  100 mg Oral QHS  . levothyroxine  50 mcg Oral QAC breakfast  . metoprolol tartrate  12.5 mg Oral BID  . oxybutynin  5 mg Oral BID  . sodium chloride flush  10-40 mL Intracatheter Q12H   Continuous Infusions: . sodium chloride 50 mL/hr at 04/15/18 0802  . ceFEPime (MAXIPIME) IV Stopped (04/15/18 0700)  . famotidine (PEPCID) IV 20 mg (04/15/18 0800)  . methocarbamol (ROBAXIN) IV 500 mg (04/15/18 2330)  . metronidazole 500 mg (04/15/18 0615)     LOS: 10 days        Aline August, MD [facility] Pager [phone]  If 7PM-7AM, please contact night-coverage [email or web address] Password [email or web address] 04/15/2018, 9:09 AM

## 2018-04-15 NOTE — Progress Notes (Signed)
[facility] Progress Note  4 Days Post-Op  Subjective: CC-  Still very sore this morning. Continues to complain of abdominal pain. A little nauseated when she woke up this morning, no emesis. No flatus or BM. Tolerating few sips of clears. Spent time in the chair yesterday, plans to do the same today.  Objective: Vital signs in last 24 hours: Temp:  [97.8 F (36.6 C)-98.8 F (37.1 C)] 97.8 F (36.6 C) (10/13 0628) Pulse Rate:  [57-65] 64 (10/13 0753) Resp:  [16-18] 18 (10/13 0628) BP: (127-149)/(60-76) 138/69 (10/13 0753) SpO2:  [95 %-99 %] 98 % (10/13 0753) Weight:  [96.7 kg] 96.7 kg (10/13 0619) Last BM Date: 04/09/18  Intake/Output from previous day: 10/12 0701 - 10/13 0700 In: 4448.2 [P.O.:360; I.V.:1335.1; IV Piggyback:2753.1] Out: 3055 [Urine:2950; Drains:105] Intake/Output this shift: Total I/O In: 150 [IV Piggyback:150] Out: 500 [Urine:400; Drains:100]  PE: Gen:  Alert, NAD, pleasant HEENT: EOM's intact, pupils equal and round Card:  RRR Pulm:  CTAB, no W/R/R, effort normal Abd: obese, soft, ND, +BS, vac to midline incision, mild lower abdominal TTP, JP drain with SS fluid Ext:  Calves soft and nontender Skin: no rashes noted, warm and dry  Lab Results:  Recent Labs    04/14/18 0419 04/15/18 0401  WBC 7.9 7.6  HGB 8.1* 8.6*  HCT 26.1* 26.8*  PLT 362 398   BMET Recent Labs    04/14/18 0419 04/15/18 0401  NA 141 139  K 3.9 3.7  CL 111 111  CO2 23 22  GLUCOSE 92 94  BUN 9 5*  CREATININE 1.19* 1.12*  CALCIUM 8.0* 8.0*   PT/INR No results for input(s): LABPROT, INR in the last 72 hours. CMP     Component Value Date/Time   NA 139 04/15/2018 0401   K 3.7 04/15/2018 0401   CL 111 04/15/2018 0401   CO2 22 04/15/2018 0401   GLUCOSE 94 04/15/2018 0401   BUN 5 (L) 04/15/2018 0401   CREATININE 1.12 (H) 04/15/2018 0401   CALCIUM 8.0 (L) 04/15/2018 0401   PROT 6.7 04/07/2018 0537   ALBUMIN 2.4 (L) 04/07/2018 0537   AST 33  04/07/2018 0537   ALT 20 04/07/2018 0537   ALKPHOS 105 04/07/2018 0537   BILITOT 0.8 04/07/2018 0537   GFRNONAA 45 (L) 04/15/2018 0401   GFRAA 53 (L) 04/15/2018 0401   Lipase     Component Value Date/Time   LIPASE 29 04/05/2018 1301       Studies/Results: No results found.  Anti-infectives: Anti-infectives (From admission, onward)   Start     Dose/Rate Route Frequency Ordered Stop   04/06/18 2000  ceFEPIme (MAXIPIME) 1 g in sodium chloride 0.9 % 100 mL IVPB     1 g 200 mL/hr over 30 Minutes Intravenous Every 24 hours 04/05/18 2005     04/06/18 0600  metroNIDAZOLE (FLAGYL) IVPB 500 mg     500 mg 100 mL/hr over 60 Minutes Intravenous Every 8 hours 04/05/18 1957     04/05/18 1930  ceFEPIme (MAXIPIME) 2 g in sodium chloride 0.9 % 100 mL IVPB     2 g 200 mL/hr over 30 Minutes Intravenous  Once 04/05/18 1921 04/06/18 0104   04/05/18 1930  metroNIDAZOLE (FLAGYL) IVPB 500 mg     500 mg 100 mL/hr over 60 Minutes Intravenous  Once 04/05/18 1921 04/05/18 2236       Assessment/Plan Hypertension Chronic kidney disease stage III/acute kidney injury Anemia CAD with history of stents Chronic diastolic congestive  heart failure COPD with history of tobacco use Hxof CVA/PVD Hypothyroid Hx gout BMI 39.8  Localized right colonic perforation with mass Diagnostic laparoscopy with 61minutes of lysis of adhesions, right colectomy, placement of right retroperitoneal abscess drain,04/11/2018 Dr. Rolm Bookbinder - POD#4 - Pathology pending - vac changes MWF - JP output SS - no flatus or BM  FEN: IVF, sips of clears ID: Flagyl/Maxipime 10/3 =>>day#11/12 DVT: Lovenox /SCD Foley:  wick Follow up: Dr. Donne Hazel  Plan: Awaiting return in bowel function. Continue to encourage OOB/mobilziation as much as can tolerate. D/c wick and use bedside commode.    LOS: 10 days    Wellington Hampshire , [facility] 04/15/2018, 8:38 AM Pager: [phone] Mon 7:00  am -11:30 AM Tues-Fri 7:00 am-4:30 pm Sat-Sun 7:00 am-11:30 am

## 2018-04-15 NOTE — Progress Notes (Signed)
Patient has had no oral intake this shift except for some sips of water. Educated patient frequently about importance of eating, but patient states she just doesn't have an appetite. Patient says she will try to eat some chicken broth later. Dr. Starla Link paged to notify of poor po intake. Will continue to monitor.

## 2018-04-15 NOTE — Progress Notes (Signed)
Occupational Therapy Treatment Patient Details Name: Joan Elliott MRN: [id number] DOB: 03/08/1941 Today's Date: 04/15/2018    History of present illness Pt is a 77 y/o female admitted secondry to worsen in RLQ pain. Thought to have acute perforated appendicitis with abscess, however, a mass has not been ruled out. IR denied drain placement per notes.OR 04/11/18 for colectomy and VAC placed.  PMH includes CKD and HTN.     OT comments  Pt progressing toward OT goals, with improvements in functional mobility this session. Pt still lethargic and with pain at surgical sites. Pt requires max A for distal LE dressing and min guard during stand pivot to chair. Continue OT POC, d/c plan remains appropriate.   Follow Up Recommendations  Home health OT    Equipment Recommendations  3 in 1 bedside commode       Precautions / Restrictions Precautions Precautions: None Restrictions Weight Bearing Restrictions: No       Mobility Bed Mobility Overal bed mobility: Needs Assistance Bed Mobility: Supine to Sit     Supine to sit: Min guard     General bed mobility comments: using bed rail to roll onto R side and then able to push up with Elbow, painful  Transfers Overall transfer level: Needs assistance Equipment used: None Transfers: Sit to/from Stand;Stand Pivot Transfers Sit to Stand: Supervision Stand pivot transfers: Min guard       General transfer comment: no AE used, min guard    Balance Overall balance assessment: Needs assistance Sitting-balance support: No upper extremity supported;Feet supported Sitting balance-Leahy Scale: Normal     Standing balance support: Bilateral upper extremity supported;During functional activity Standing balance-Leahy Scale: Fair Standing balance comment: Able to progress to no UE support                           ADL either performed or assessed with clinical judgement   ADL Overall ADL's : Needs assistance/impaired                      Lower Body Dressing: Maximal assistance;Sit to/from stand Lower Body Dressing Details (indicate cue type and reason): donning socks             Functional mobility during ADLs: Min guard                 Cognition Arousal/Alertness: Lethargic;Suspect due to medications Behavior During Therapy: WFL for tasks assessed/performed Overall Cognitive Status: Within Functional Limits for tasks assessed                                                     Pertinent Vitals/ Pain       Pain Assessment: 0-10 Pain Score: 6  Pain Location: lower R abdomen  Pain Descriptors / Indicators: Aching Pain Intervention(s): Monitored during session         Frequency  Min 2X/week        Progress Toward Goals  OT Goals(current goals can now be found in the care plan section)  Progress towards OT goals: Progressing toward goals  Acute Rehab OT Goals Patient Stated Goal: "pain in stomach to go down" OT Goal Formulation: With patient Time For Goal Achievement: 04/25/18 Potential to Achieve Goals: Good  Plan Discharge plan remains appropriate  AM-PAC PT "6 Clicks" Daily Activity     Outcome Measure   Help from another person eating meals?: None Help from another person taking care of personal grooming?: None Help from another person toileting, which includes using toliet, bedpan, or urinal?: A Little Help from another person bathing (including washing, rinsing, drying)?: A Lot Help from another person to put on and taking off regular upper body clothing?: A Little Help from another person to put on and taking off regular lower body clothing?: A Lot 6 Click Score: 18       Activity Tolerance Patient tolerated treatment well   Patient Left in chair;with call bell/phone within reach   Nurse Communication Mobility status;Precautions        Time: 6222-9798 OT Time Calculation (min): 20 min  Charges: OT General Charges $OT  Visit: 1 Visit OT Treatments $Self Care/Home Management : 8-22 mins   Curtis Sites OTR/L 04/15/2018, 9:35 AM

## 2018-04-16 LAB — CBC WITH DIFFERENTIAL/PLATELET
Abs Immature Granulocytes: 0.09 10*3/uL — ABNORMAL HIGH (ref 0.00–0.07)
BASOS ABS: 0 10*3/uL (ref 0.0–0.1)
Basophils Relative: 0 %
EOS ABS: 0.1 10*3/uL (ref 0.0–0.5)
Eosinophils Relative: 2 %
HEMATOCRIT: 27.2 % — AB (ref 36.0–46.0)
HEMOGLOBIN: 8.9 g/dL — AB (ref 12.0–15.0)
Immature Granulocytes: 1 %
LYMPHS ABS: 0.9 10*3/uL (ref 0.7–4.0)
LYMPHS PCT: 12 %
MCH: 27.7 pg (ref 26.0–34.0)
MCHC: 32.7 g/dL (ref 30.0–36.0)
MCV: 84.7 fL (ref 80.0–100.0)
Monocytes Absolute: 0.5 10*3/uL (ref 0.1–1.0)
Monocytes Relative: 7 %
NRBC: 0 % (ref 0.0–0.2)
Neutro Abs: 6.2 10*3/uL (ref 1.7–7.7)
Neutrophils Relative %: 78 %
Platelets: 427 10*3/uL — ABNORMAL HIGH (ref 150–400)
RBC: 3.21 MIL/uL — ABNORMAL LOW (ref 3.87–5.11)
RDW: 14.9 % (ref 11.5–15.5)
WBC: 7.9 10*3/uL (ref 4.0–10.5)

## 2018-04-16 LAB — BASIC METABOLIC PANEL
Anion gap: 6 (ref 5–15)
BUN: 6 mg/dL — ABNORMAL LOW (ref 8–23)
CALCIUM: 8 mg/dL — AB (ref 8.9–10.3)
CO2: 22 mmol/L (ref 22–32)
CREATININE: 1.1 mg/dL — AB (ref 0.44–1.00)
Chloride: 111 mmol/L (ref 98–111)
GFR calc non Af Amer: 46 mL/min — ABNORMAL LOW (ref >60)
GFR, EST AFRICAN AMERICAN: 54 mL/min — AB (ref >60)
Glucose, Bld: 88 mg/dL (ref 70–99)
Potassium: 3.7 mmol/L (ref 3.5–5.1)
SODIUM: 139 mmol/L (ref 135–145)

## 2018-04-16 LAB — GLUCOSE, CAPILLARY
GLUCOSE-CAPILLARY: 84 mg/dL (ref 70–99)
GLUCOSE-CAPILLARY: 182 mg/dL — AB (ref 70–99)
GLUCOSE-CAPILLARY: 171 mg/dL — AB (ref 70–99)

## 2018-04-16 LAB — MAGNESIUM: Magnesium: 1.7 mg/dL (ref 1.7–2.4)

## 2018-04-16 MED ORDER — HYDROCHLOROTHIAZIDE 12.5 MG PO CAPS: 12.5000 mg | ORAL_CAPSULE | Freq: Every day | ORAL | Status: DC

## 2018-04-16 MED ORDER — LOSARTAN POTASSIUM 50 MG PO TABS: 100.0000 mg | ORAL_TABLET | Freq: Every day | ORAL | Status: DC

## 2018-04-16 MED ORDER — HYDROMORPHONE HCL 1 MG/ML IJ SOLN
0.5000 mg | Freq: Once | INTRAMUSCULAR | Status: AC
  Administered 2018-04-16: 0.5 mg via INTRAVENOUS
  Filled 2018-04-16: qty 0.5

## 2018-04-16 MED ORDER — CODEINE SULFATE 15 MG PO TABS: 30.0000 mg | ORAL_TABLET | ORAL | Status: DC | PRN

## 2018-04-16 MED ORDER — LOSARTAN POTASSIUM-HCTZ 100-12.5 MG PO TABS: 1.0000 | ORAL_TABLET | Freq: Every morning | ORAL | Status: DC

## 2018-04-16 MED ORDER — LOSARTAN POTASSIUM 50 MG PO TABS
100.0000 mg | ORAL_TABLET | Freq: Every day | ORAL | Status: DC
  Administered 2018-04-16 – 2018-04-18 (×3): 100 mg via ORAL
  Filled 2018-04-16 (×3): qty 2

## 2018-04-16 MED ORDER — BOOST / RESOURCE BREEZE PO LIQD CUSTOM
1.0000 | Freq: Three times a day (TID) | ORAL | Status: DC
  Administered 2018-04-16 – 2018-04-18 (×7): 1 via ORAL

## 2018-04-16 MED ORDER — HYDROCHLOROTHIAZIDE 12.5 MG PO CAPS
12.5000 mg | ORAL_CAPSULE | Freq: Every day | ORAL | Status: DC
  Administered 2018-04-16 – 2018-04-18 (×3): 12.5 mg via ORAL
  Filled 2018-04-16 (×5): qty 1

## 2018-04-16 MED ORDER — INSULIN ASPART 100 UNIT/ML ~~LOC~~ SOLN
0.0000 [IU] | SUBCUTANEOUS | Status: DC
  Administered 2018-04-16 – 2018-04-17 (×3): 2 [IU] via SUBCUTANEOUS
  Administered 2018-04-17 (×2): 1 [IU] via SUBCUTANEOUS
  Administered 2018-04-17: 3 [IU] via SUBCUTANEOUS
  Administered 2018-04-17 – 2018-04-19 (×7): 1 [IU] via SUBCUTANEOUS
  Administered 2018-04-19 (×3): 2 [IU] via SUBCUTANEOUS
  Administered 2018-04-19: 1 [IU] via SUBCUTANEOUS
  Administered 2018-04-19: 2 [IU] via SUBCUTANEOUS
  Administered 2018-04-20 – 2018-04-22 (×4): 1 [IU] via SUBCUTANEOUS

## 2018-04-16 MED ORDER — TRAVASOL 10 % IV SOLN
INTRAVENOUS | Status: AC
  Administered 2018-04-16: 17:00:00 via INTRAVENOUS
  Filled 2018-04-16: qty 393.6

## 2018-04-16 NOTE — Progress Notes (Addendum)
Patient ID: Joan Elliott, female   DOB: [birth year], 77 y.o.   MRN: [id number]  PROGRESS NOTE    Joan Elliott  CVE:[id number] DOB: 25-Jan-1941 DOA: 04/05/2018 PCP: Joan Ebbs, MD   Brief Narrative:  77 year old female with history of chronic kidney disease stage III, hypertension, stroke, CAD presented on 04/05/2018 with abdominal pain.  She was admitted with probable perforated appendicitis and probable underlying mass.  General surgery was consulted.  Patient was started on IV antibiotics.  IR was consulted but there was no abscess to be drained.  Patient continued to have abdominal pain.  She underwent diagnostic laparoscopy with lysis of adhesions and open right colectomy on 04/11/2018 by general surgery.   Assessment & Plan:   Principal Problem:   Acute perforated appendicitis Active Problems:   CKD (chronic kidney disease) stage 3, GFR 30-59 ml/min (HCC)   AKI (acute kidney injury) ([place])   HTN (hypertension)   Localized right colonic perforation causing abscess/mass -Status post diagnostic laparoscopy with lysis of adhesions and open right colectomy on 04/11/2018 by general surgery.  Pathology pending.  Diet advancement as per general surgery. Apetite is still very poor.  TPN is being started by general surgery.  Wound care as per general surgery.  Continue cefepime and Flagyl for a total of 7 days postop as per general surgery recommendations.  Pain management.  No bowel movements yet.  Leukocytosis -Probably reactive.  Resolved.  Repeat a.m. labs  Acute anemia -Probably from GI blood loss in the setting of perforated appendicitis -Hemoglobin stable.  Monitor  Acute kidney injury on chronic kidney disease stage III-IV -No evidence of nephrolithiasis or hydronephrosis on CT of the abdomen and pelvis.   -Improved.  Monitor -Home losartan/hydrochlorothiazide on hold for now  CAD with stent in RCA and history of CVA, history of PVD -Continue beta-blocker and statin.  Plavix  held probably for surgery  Chronic diastolic CHF -Currently compensated.  Monitor strict input and output.  Continue metoprolol.  Losartan/hydrochlorthiazide on hold.    Hypertension -Blood pressure on the higher side.  Will restart losartan/hydrochlorothiazide.  COPD with tobacco abuse -Stable.  No wheezing or hypoxia  Hypothyroidism On Synthroid  Gout -Stable on allopurinol  Obesity -Outpatient follow-up  DVT prophylaxis: Lovenox Code Status: Full Family Communication: None at bedside Disposition Plan: Depends on clinical outcome  Consultants: General surgery/IR  Procedures: diagnostic laparoscopy with lysis of adhesions and open right colectomy on 04/11/2018 by general surgery  Antimicrobials: Cefepime and Flagyl from 04/05/2018 onwards   Subjective: Patient seen and examined at bedside.  Patient states that her appetite is still very poor and wants to get out of bed.  She complains of abdominal pain and nausea.  No vomiting.  Feels weak and tired.  Objective: Vitals:   04/15/18 2026 04/15/18 2031 04/16/18 0050 04/16/18 0550  BP: (!) 157/72 (!) 157/72 (!) 155/72 (!) 168/81  Pulse: 65 64 65 (!) 59  Resp: 17 17 17 18   Temp:  98.4 F (36.9 C)  98 F (36.7 C)  TempSrc:      SpO2: 100% 99% 95% 100%  Weight:      Height:        Intake/Output Summary (Last 24 hours) at 04/16/2018 0925 Last data filed at 04/16/2018 0550 Gross per 24 hour  Intake 1526.47 ml  Output 1580 ml  Net -53.53 ml   Filed Weights   04/09/18 0314 04/15/18 0619  Weight: 92.5 kg 96.7 kg    Examination:  General exam: Appears  in mild distress secondary to abdominal pain.  Awake and alert  respiratory system: Bilateral decreased breath sounds at bases, scattered crackles Cardiovascular system: S1-S2 heard, rate controlled gastrointestinal system: Abdomen is nondistended, soft and mildly tender diffusely.  Bowel sounds sluggish.  JP drain and wound VAC present extremities: No cyanosis,  edema    Data Reviewed: I have personally reviewed following labs and imaging studies  CBC: Recent Labs  Lab 04/10/18 0521  04/12/18 0351 04/13/18 0327 04/14/18 0419 04/15/18 0401 04/16/18 0403  WBC 6.8   < > 14.6* 11.5* 7.9 7.6 7.9  NEUTROABS 4.3  --  13.7*  --  5.9 5.7 6.2  HGB 10.3*   < > 10.0* 8.7* 8.1* 8.6* 8.9*  HCT 31.0*   < > 30.5* 26.6* 26.1* 26.8* 27.2*  MCV 83.3   < > 84.0 84.4 85.9 85.1 84.7  PLT 278   < > 394 370 362 398 427*   < > = values in this interval not displayed.   Basic Metabolic Panel: Recent Labs  Lab 04/12/18 0351 04/13/18 0327 04/14/18 0419 04/15/18 0401 04/16/18 0403  NA 139 141 141 139 139  K 5.1 4.2 3.9 3.7 3.7  CL 106 110 111 111 111  CO2 22 24 23 22 22   GLUCOSE 127* 118* 92 94 88  BUN 15 17 9  5* 6*  CREATININE 1.49* 1.44* 1.19* 1.12* 1.10*  CALCIUM 8.7* 8.2* 8.0* 8.0* 8.0*  MG 1.9 1.9 1.8 1.8 1.7   GFR: Estimated Creatinine Clearance: 43.2 mL/min (A) (by C-G formula based on SCr of 1.1 mg/dL (H)). Liver Function Tests: No results for input(s): AST, ALT, ALKPHOS, BILITOT, PROT, ALBUMIN in the last 168 hours. No results for input(s): LIPASE, AMYLASE in the last 168 hours. No results for input(s): AMMONIA in the last 168 hours. Coagulation Profile: No results for input(s): INR, PROTIME in the last 168 hours. Cardiac Enzymes: No results for input(s): CKTOTAL, CKMB, CKMBINDEX, TROPONINI in the last 168 hours. BNP (last 3 results) No results for input(s): PROBNP in the last 8760 hours. HbA1C: No results for input(s): HGBA1C in the last 72 hours. CBG: No results for input(s): GLUCAP in the last 168 hours. Lipid Profile: No results for input(s): CHOL, HDL, LDLCALC, TRIG, CHOLHDL, LDLDIRECT in the last 72 hours. Thyroid Function Tests: No results for input(s): TSH, T4TOTAL, FREET4, T3FREE, THYROIDAB in the last 72 hours. Anemia Panel: No results for input(s): VITAMINB12, FOLATE, FERRITIN, TIBC, IRON, RETICCTPCT in the last 72  hours. Sepsis Labs: No results for input(s): PROCALCITON, LATICACIDVEN in the last 168 hours.  Recent Results (from the past 240 hour(s))  Surgical pcr screen     Status: None   Collection Time: 04/11/18 11:34 AM  Result Value Ref Range Status   MRSA, PCR NEGATIVE NEGATIVE Final   Staphylococcus aureus NEGATIVE NEGATIVE Final    Comment: (NOTE) The Xpert SA Assay (FDA approved for NASAL specimens in patients 20 years of age and older), is one component of a comprehensive surveillance program. It is not intended to diagnose infection nor to guide or monitor treatment. Performed at [facility], [place] [address] [place], [place] [address]          Radiology Studies: No results found.      Scheduled Meds: . acetaminophen  1,000 mg Oral Q8H  . Chlorhexidine Gluconate Cloth  6 each Topical Q0600  . enoxaparin (LOVENOX) injection  40 mg Subcutaneous Q24H  . gabapentin  100 mg Oral QHS  .  HYDROmorphone (DILAUDID) injection  0.5 mg Intravenous Once  . levothyroxine  50 mcg Oral QAC breakfast  . metoprolol tartrate  12.5 mg Oral BID  . oxybutynin  5 mg Oral BID  . sodium chloride flush  10-40 mL Intracatheter Q12H   Continuous Infusions: . sodium chloride 50 mL/hr at 04/15/18 2032  . ceFEPime (MAXIPIME) IV 1 g (04/15/18 2034)  . famotidine (PEPCID) IV Stopped (04/15/18 0905)  . methocarbamol (ROBAXIN) IV 500 mg (04/16/18 0535)  . metronidazole 500 mg (04/16/18 0535)     LOS: 11 days        Joan August, MD [facility] Pager [phone]  If 7PM-7AM, please contact night-coverage [email or web address] Password [email or web address] 04/16/2018, 9:25 AM

## 2018-04-16 NOTE — Plan of Care (Signed)

## 2018-04-16 NOTE — Progress Notes (Signed)
5 Days Post-Op    DX:IPJASNKNL pain  Subjective: Complaining of pain, has not been out of bed, still has a wick in place. Nothing for pain except Tylenol, robaxin, one dose of codeine last PM for the day, and Neurontin.  I have spoken to nursing who reports "she just got here." PT and OT ordered  Objective: Vital signs in last 24 hours: Temp:  [98 F (36.7 C)-98.5 F (36.9 C)] 98 F (36.7 C) (10/14 0550) Pulse Rate:  [57-65] 59 (10/14 0550) Resp:  [17-18] 18 (10/14 0550) BP: (136-168)/(69-81) 168/81 (10/14 0550) SpO2:  [95 %-100 %] 100 % (10/14 0550) Last BM Date: 04/09/18 580 PO 1500 IV 1900 urine Drain 180 Afebrile, VSS Labs OK Creatinine is stable, Mag 1.7,  K+ 3.7 WBC 7.9  H/H stable 8.9/27.2 .   Intake/Output from previous day: 10/13 0701 - 10/14 0700 In: 1976.5 [P.O.:580; I.V.:609.4; IV Piggyback:787.1] Out: 2080 [Urine:1900; Drains:180] Intake/Output this shift: No intake/output data recorded.  General appearance: alert, cooperative and no distress Resp: clear to auscultation bilaterally GI: soft, extremely sore, few BS, not aware of flatus. Wound vac in place, drain is serous   Lab Results:  Recent Labs    04/15/18 0401 04/16/18 0403  WBC 7.6 7.9  HGB 8.6* 8.9*  HCT 26.8* 27.2*  PLT 398 427*    BMET Recent Labs    04/15/18 0401 04/16/18 0403  NA 139 139  K 3.7 3.7  CL 111 111  CO2 22 22  GLUCOSE 94 88  BUN 5* 6*  CREATININE 1.12* 1.10*  CALCIUM 8.0* 8.0*   PT/INR No results for input(s): LABPROT, INR in the last 72 hours.  No results for input(s): AST, ALT, ALKPHOS, BILITOT, PROT, ALBUMIN in the last 168 hours.   Lipase     Component Value Date/Time   LIPASE 29 04/05/2018 1301     Medications: . acetaminophen  1,000 mg Oral Q8H  . Chlorhexidine Gluconate Cloth  6 each Topical Q0600  . enoxaparin (LOVENOX) injection  40 mg Subcutaneous Q24H  . gabapentin  100 mg Oral QHS  . levothyroxine  50 mcg Oral QAC breakfast  .  metoprolol tartrate  12.5 mg Oral BID  . oxybutynin  5 mg Oral BID  . sodium chloride flush  10-40 mL Intracatheter Q12H   . sodium chloride 50 mL/hr at 04/15/18 2032  . ceFEPime (MAXIPIME) IV 1 g (04/15/18 2034)  . famotidine (PEPCID) IV Stopped (04/15/18 0905)  . methocarbamol (ROBAXIN) IV 500 mg (04/16/18 0535)  . metronidazole 500 mg (04/16/18 0535)   Anti-infectives (From admission, onward)   Start     Dose/Rate Route Frequency Ordered Stop   04/06/18 2000  ceFEPIme (MAXIPIME) 1 g in sodium chloride 0.9 % 100 mL IVPB     1 g 200 mL/hr over 30 Minutes Intravenous Every 24 hours 04/05/18 2005     04/06/18 0600  metroNIDAZOLE (FLAGYL) IVPB 500 mg     500 mg 100 mL/hr over 60 Minutes Intravenous Every 8 hours 04/05/18 1957     04/05/18 1930  ceFEPIme (MAXIPIME) 2 g in sodium chloride 0.9 % 100 mL IVPB     2 g 200 mL/hr over 30 Minutes Intravenous  Once 04/05/18 1921 04/06/18 0104   04/05/18 1930  metroNIDAZOLE (FLAGYL) IVPB 500 mg     500 mg 100 mL/hr over 60 Minutes Intravenous  Once 04/05/18 1921 04/05/18 2236      Assessment/Plan Hypertension Chronic kidney disease stage III/acute kidney injury Anemia CAD  with history of stents Chronic diastolic congestive heart failure COPD with history of tobacco use Hxof CVA/PVD Hypothyroid Hx gout BMI 39.8  Localized right colonic perforation with mass Diagnostic laparoscopy with 34minutes of lysis of adhesions, right colectomy, placement of right retroperitoneal abscess drain,04/11/2018 Dr. Rolm Bookbinder -POD#5 - Pathology pending - vac changes MWF - JP output SS - no flatus or BM  FEN: IVF, clears ID: Flagyl/Maxipime 10/3 =>>day 12/12 DVT: Lovenox /SCD Foley:wick Follow up: Dr. Donne Hazel   Plan:  Transfer to 6N if OK with Medicine.  OOB, DC wick, give her some pain medicine now,   Add TNA.      LOS: 11 days    Joan Elliott 04/16/2018 [phone]

## 2018-04-16 NOTE — Plan of Care (Signed)
  Problem: Education: Goal: Knowledge of General Education information will improve Description Including pain rating scale, medication(s)/side effects and non-pharmacologic comfort measures Outcome: Progressing   Problem: Health Behavior/Discharge Planning: Goal: Ability to manage health-related needs will improve Outcome: Progressing   Problem: Clinical Measurements: Goal: Ability to maintain clinical measurements within normal limits will improve Outcome: Progressing Goal: Will remain free from infection Outcome: Progressing Goal: Diagnostic test results will improve Outcome: Progressing Goal: Respiratory complications will improve Outcome: Progressing Goal: Cardiovascular complication will be avoided Outcome: Progressing   Problem: Activity: Goal: Risk for activity intolerance will decrease Outcome: Progressing   Problem: Nutrition: Goal: Adequate nutrition will be maintained Outcome: Progressing   Problem: Coping: Goal: Level of anxiety will decrease Outcome: Progressing   Problem: Elimination: Goal: Will not experience complications related to bowel motility Outcome: Progressing Goal: Will not experience complications related to urinary retention Outcome: Progressing   Problem: Pain Managment: Goal: General experience of comfort will improve Outcome: Progressing   Problem: Safety: Goal: Ability to remain free from injury will improve Outcome: Progressing   Problem: Skin Integrity: Goal: Risk for impaired skin integrity will decrease Outcome: Progressing   Problem: Clinical Measurements: Goal: Ability to maintain clinical measurements within normal limits will improve Outcome: Progressing Goal: Postoperative complications will be avoided or minimized Outcome: Progressing   Problem: Skin Integrity: Goal: Demonstration of wound healing without infection will improve Outcome: Progressing  Forestine Chute, RN 04/16/2018

## 2018-04-16 NOTE — Progress Notes (Signed)
[place] CONSULT NOTE   Pharmacy Consult for TPN Indication: prolonged ileus  Patient Measurements: Height: 5' (152.4 cm) Weight: 213 lb 3 oz (96.7 kg) IBW/kg (Calculated) : 45.5 TPN AdjBW (KG): 57.3 Body mass index is 41.63 kg/m.  Assessment:  81 yof presented to the hospital with worsening RLQ abdominal pain with associated anorexia and poor PO intake. Found to have R colonic perforation with mass. S/p diagnostic laparoscopy with LOA, R colectomy and placement of R retroperitoneal abscess drain. To start TPN for nutrition support.   GI: s/p diagnostic laparoscopy with LOA, R colectomy with abscess drain placement - drain OP 180 - FAMOTIDINE Endo: No hx of DM - will monitor CBGs with start of TPN; synthroid (TSH WNL) Insulin requirements in the past 24 hours: 0 units Lytes: WNL Renal: SCr 1.1 (at baseline), UOP 0.8 Pulm: RA Cards: VSS - hctz, losartan, metoprolol Hepatobil: LFTs WNL Neuro: A&Ox4 - gabapentin ID: Flagyl/cefepime for 7 days post-op. Stop dates on place. Afebrile, WBC WNL  TPN Access: CVC TPN start date: 10/14>> Nutritional Goals (per RD recommendation on 10/14): KCal: 1600-1800 Protein: 75-90 g Fluid: >1.6 L  Goal TPN rate is 75 ml/hr (provides 74g protein, 1717 kcal)  Current Nutrition:  Clear liquid diet  Plan:  Start TPN at 41mL/hr This TPN provides 39 g of protein, 144 g of dextrose, and 27 g of lipids which provides 915 kCals per day, meeting 53% of patient needs Electrolytes in TPN: standard Add MVI, trace elements Start sensitive SSI and adjust as needed NS IVMFat 40 ml/hr Monitor TPN labs  Joan Elliott, Rande Lawman 04/16/2018,9:40 AM

## 2018-04-16 NOTE — Progress Notes (Signed)
Patient report given to 6N receiving RN.  Patient being transferred to 6N rm 03.   Marcille Blanco, RN

## 2018-04-16 NOTE — Progress Notes (Signed)
Nutrition Follow-up  DOCUMENTATION CODES:   Obesity unspecified  INTERVENTION:   -TPN management per pharmacy -Boost Breeze po TID, each supplement provides 250 kcal and 9 grams of protein -RD will follow for diet advancement and adjust supplement regimen as appropriate  NUTRITION DIAGNOSIS:   Inadequate oral intake related to altered GI function, decreased appetite as evidenced by meal completion < 50%, per patient/family report.  Progressing  GOAL:   Patient will meet greater than or equal to 90% of their needs  Progressing  MONITOR:   PO intake, Supplement acceptance, Diet advancement, Labs, Weight trends, Skin, I & O's  REASON FOR ASSESSMENT:   Consult New TPN/TNA  ASSESSMENT:   Joan Elliott is a 77 y.o. female with medical history significant of CKD stage 3, HTN, stroke, CAD. Patient presents to the ED with ~7 day history of worsening RLQ abd pain.  Associated anorexia and poor PO intake.  Passing gas but no BM in past couple of days.  Symptoms worsening, nothing makes it better nor worse.  Had nausea without vomiting.  Had chills but no fevers.  Urine color change over past week.  10/4- per IR notes, drain not placed as collection is phlegmon 10/9- s/p Procedure: Diagnostic laparoscopy with lysis of adhesions, open right colectomy, and placement of rt retroperitoneal abscess drain 10/11- advanced to clear liquids 10/14- x-fer to surgical floor   Reviewed I/O's: 104 ml x24 hours and +7 L since admission  Pt with localized rt colonic perforation with mass; pathology pending.   Pt resting quietly in recliner chair at time of visit. Pt was advanced to a clear liquid diet and tolerating well with encouragement; noted meal completion 25-100%.   Per pharmacy note, plan to place PICC and initiate TPN. Plan to start TPN at 58mL/hr, which provides 915 kcals and 39 grams protein, meeting 55% of estimated kcal needs and 43% of of estimated protein needs.  Labs reviewed:  CBGS: 84 (inpatient orders for glycemic control are 0-9 units insulin aspart every 4 hours), K and Mg WDL.   Diet Order:   Diet Order            Diet clear liquid Room service appropriate? No; Fluid consistency: Thin  Diet effective now              EDUCATION NEEDS:   No education needs have been identified at this time  Skin:  Skin Assessment: Skin Integrity Issues: Skin Integrity Issues:: Incisions, Wound VAC Wound Vac: abdomen Incisions: closed abdomen  Last BM:  04/09/18  Height:   Ht Readings from Last 1 Encounters:  04/16/18 5' (1.524 m)    Weight:   Wt Readings from Last 1 Encounters:  04/16/18 93.4 kg    Ideal Body Weight:  45.5 kg  BMI:  Body mass index is 40.21 kg/m.  Estimated Nutritional Needs:   Kcal:  1650-1850  Protein:  90-105 grams  Fluid:  > 1.7 L    Nunzio Banet A. Jimmye Norman, RD, LDN, CDE Pager: [phone] After hours Pager: [phone]

## 2018-04-16 NOTE — Care Management Note (Addendum)
Case Management Note  Patient Details  Name: Joan Elliott MRN: [id number] Date of Birth: 07-11-1940  Subjective/Objective:                    Action/Plan:  Patient's insurance not in network for home [employer] V[employer]. If negative pressure system needed at home , MD/PA please sign application for Negative wound pressure system through [facility] placed in shadow chart. Thanks  Expected Discharge Date:                  Expected Discharge Plan:  [place]  In-House Referral:     Discharge planning Services  CM Consult  Post Acute Care Choice:    Choice offered to:  Patient  DME Arranged:    DME Agency:     HH Arranged:  RN, PT, OT, Nurse's Aide Theodore Agency:  [facility]  Status of Service:  In process, will continue to follow  If discussed at Long Length of Stay Meetings, dates discussed:    Additional Comments:  Marilu Favre, RN 04/16/2018, 12:05 PM

## 2018-04-17 LAB — CBC WITH DIFFERENTIAL/PLATELET
ABS IMMATURE GRANULOCYTES: 0.08 10*3/uL — AB (ref 0.00–0.07)
Basophils Absolute: 0 10*3/uL (ref 0.0–0.1)
Basophils Relative: 0 %
Eosinophils Absolute: 0.2 10*3/uL (ref 0.0–0.5)
Eosinophils Relative: 3 %
HEMATOCRIT: 27.1 % — AB (ref 36.0–46.0)
HEMOGLOBIN: 8.8 g/dL — AB (ref 12.0–15.0)
Immature Granulocytes: 1 %
LYMPHS ABS: 1.5 10*3/uL (ref 0.7–4.0)
LYMPHS PCT: 22 %
MCH: 27.4 pg (ref 26.0–34.0)
MCHC: 32.5 g/dL (ref 30.0–36.0)
MCV: 84.4 fL (ref 80.0–100.0)
MONO ABS: 0.6 10*3/uL (ref 0.1–1.0)
MONOS PCT: 9 %
NEUTROS ABS: 4.5 10*3/uL (ref 1.7–7.7)
Neutrophils Relative %: 65 %
Platelets: 433 10*3/uL — ABNORMAL HIGH (ref 150–400)
RBC: 3.21 MIL/uL — ABNORMAL LOW (ref 3.87–5.11)
RDW: 14.9 % (ref 11.5–15.5)
WBC: 6.8 10*3/uL (ref 4.0–10.5)
nRBC: 0 % (ref 0.0–0.2)

## 2018-04-17 LAB — GLUCOSE, CAPILLARY
Glucose-Capillary: 117 mg/dL — ABNORMAL HIGH (ref 70–99)
Glucose-Capillary: 126 mg/dL — ABNORMAL HIGH (ref 70–99)
Glucose-Capillary: 130 mg/dL — ABNORMAL HIGH (ref 70–99)
Glucose-Capillary: 134 mg/dL — ABNORMAL HIGH (ref 70–99)
Glucose-Capillary: 152 mg/dL — ABNORMAL HIGH (ref 70–99)
Glucose-Capillary: 217 mg/dL — ABNORMAL HIGH (ref 70–99)

## 2018-04-17 LAB — MAGNESIUM: Magnesium: 1.7 mg/dL (ref 1.7–2.4)

## 2018-04-17 LAB — COMPREHENSIVE METABOLIC PANEL
ALK PHOS: 90 U/L (ref 38–126)
ALT: 15 U/L (ref 0–44)
ANION GAP: 4 — AB (ref 5–15)
AST: 28 U/L (ref 15–41)
Albumin: 2.1 g/dL — ABNORMAL LOW (ref 3.5–5.0)
BILIRUBIN TOTAL: 0.4 mg/dL (ref 0.3–1.2)
BUN: 10 mg/dL (ref 8–23)
CALCIUM: 8 mg/dL — AB (ref 8.9–10.3)
CO2: 22 mmol/L (ref 22–32)
CREATININE: 1.02 mg/dL — AB (ref 0.44–1.00)
Chloride: 111 mmol/L (ref 98–111)
GFR calc non Af Amer: 51 mL/min — ABNORMAL LOW (ref >60)
GFR, EST AFRICAN AMERICAN: 59 mL/min — AB (ref >60)
Glucose, Bld: 124 mg/dL — ABNORMAL HIGH (ref 70–99)
Potassium: 3.6 mmol/L (ref 3.5–5.1)
Sodium: 137 mmol/L (ref 135–145)
TOTAL PROTEIN: 5.3 g/dL — AB (ref 6.5–8.1)

## 2018-04-17 LAB — TRIGLYCERIDES: Triglycerides: 97 mg/dL (ref <150)

## 2018-04-17 LAB — PREALBUMIN: Prealbumin: 15.4 mg/dL — ABNORMAL LOW (ref 18–38)

## 2018-04-17 LAB — PHOSPHORUS: Phosphorus: 2.6 mg/dL (ref 2.5–4.6)

## 2018-04-17 MED ORDER — TRAVASOL 10 % IV SOLN
INTRAVENOUS | Status: AC
  Administered 2018-04-17: 17:00:00 via INTRAVENOUS
  Filled 2018-04-17: qty 660

## 2018-04-17 NOTE — Progress Notes (Signed)
Occupational Therapy Treatment Patient Details Name: Joan Elliott MRN: [id number] DOB: [birth year] Today's Date: 04/17/2018    History of present illness Pt is a 77 y/o female admitted secondry to worsen in RLQ pain. Thought to have acute perforated appendicitis with abscess, however, a mass has not been ruled out. IR denied drain placement per notes.OR 04/11/18 for colectomy and VAC placed.  PMH includes CKD and HTN.     OT comments  Pt completed LB dressing with AE and basic BSC transfer. Pt noted to c/o burning with void of bladder. RN notified and pt advised to keep RN informed of these changes. OT continues to follow acutely for LB adls.    Follow Up Recommendations  Home health OT    Equipment Recommendations  3 in 1 bedside commode    Recommendations for Other Services      Precautions / Restrictions Precautions Precautions: Other (comment)(wound vac) Precaution Comments: wound vac       Mobility Bed Mobility               General bed mobility comments: in chair on arrival  Transfers Overall transfer level: Needs assistance   Transfers: Sit to/from Stand;Stand Pivot Transfers Sit to Stand: Supervision              Balance Overall balance assessment: Needs assistance         Standing balance support: Bilateral upper extremity supported;During functional activity Standing balance-Leahy Scale: Fair                             ADL either performed or assessed with clinical judgement   ADL Overall ADL's : Needs assistance/impaired                     Lower Body Dressing: Minimal assistance;Sit to/from stand;With adaptive equipment Lower Body Dressing Details (indicate cue type and reason): pt educated on use of reacher for LB dressing with return demo.  Toilet Transfer: Min Adult nurse and Hygiene: Maximal assistance;Sit to/from stand Toileting - Clothing Manipulation Details (indicate  cue type and reason): pt unable to complete peri care after two attempts. pt states "i cant reach i need you to help me"       General ADL Comments: pt educated on the use of AE for LB and reports "yeah that works" Pt educated on dressing R LE first due to weakness. pt with burning after voidign bladder. Rn immediately notified     Vision       Perception     Praxis      Cognition Arousal/Alertness: Awake/alert Behavior During Therapy: WFL for tasks assessed/performed Overall Cognitive Status: Within Functional Limits for tasks assessed                                          Exercises     Shoulder Instructions       General Comments currently with lines and drain to wound vac    Pertinent Vitals/ Pain       Pain Assessment: Faces Faces Pain Scale: Hurts even more Pain Location: when peeing Pain Descriptors / Indicators: Burning Pain Intervention(s): Monitored during session;Repositioned  Home Living  Prior Functioning/Environment              Frequency  Min 2X/week        Progress Toward Goals  OT Goals(current goals can now be found in the care plan section)  Progress towards OT goals: Progressing toward goals  Acute Rehab OT Goals Patient Stated Goal: none stated at this time OT Goal Formulation: With patient Time For Goal Achievement: 04/25/18 Potential to Achieve Goals: Good ADL Goals Pt Will Perform Lower Body Bathing: with supervision;sit to/from stand;with adaptive equipment Pt Will Perform Lower Body Dressing: with supervision;with adaptive equipment;sit to/from stand Pt Will Transfer to Toilet: with supervision;ambulating;bedside commode Additional ADL Goal #1: pt will demonstrate energy conservation x2 during adl task and verbalize strategy being used  Plan Discharge plan remains appropriate    Co-evaluation                 AM-PAC PT "6 Clicks" Daily  Activity     Outcome Measure   Help from another person eating meals?: None Help from another person taking care of personal grooming?: None Help from another person toileting, which includes using toliet, bedpan, or urinal?: A Little Help from another person bathing (including washing, rinsing, drying)?: A Lot Help from another person to put on and taking off regular upper body clothing?: A Little Help from another person to put on and taking off regular lower body clothing?: A Lot 6 Click Score: 18    End of Session    OT Visit Diagnosis: Unsteadiness on feet (R26.81);Muscle weakness (generalized) (M62.81)   Activity Tolerance Patient tolerated treatment well   Patient Left in chair;with call bell/phone within reach   Nurse Communication Mobility status;Precautions        Time: 0865-7846 OT Time Calculation (min): 31 min  Charges: OT General Charges $OT Visit: 1 Visit OT Treatments $Self Care/Home Management : 23-37 mins   Jeri Modena, OTR/L  Acute Rehabilitation Services Pager: [phone] Office: [phone] .    Parke Poisson B 04/17/2018, 3:01 PM

## 2018-04-17 NOTE — Plan of Care (Signed)
  Problem: Activity: Goal: Risk for activity intolerance will decrease Outcome: Progressing   Problem: Nutrition: Goal: Adequate nutrition will be maintained Outcome: Progressing   Problem: Pain Managment: Goal: General experience of comfort will improve Outcome: Progressing   Problem: Skin Integrity: Goal: Risk for impaired skin integrity will decrease Outcome: Progressing   

## 2018-04-17 NOTE — Progress Notes (Signed)
[facility] Progress Note  6 Days Post-Op   Assessment/Plan Hypertension Chronic kidney disease stage III/acute kidney injury Anemia CAD with history of stents Chronic diastolic congestive heart failure COPD with history of tobacco use Hxof CVA/PVD Hypothyroid Hx gout BMI 39.8  Localized right colonic perforation with mass - S/P Diagnostic laparoscopy, LOA, R colectomy, placement of drain,04/11/2018 Dr. Donne Hazel - Pathology showed abscess of cecum diverticular disease of ascending colon, vermiform appendix, 3 morphologically benign lymph nodes - vac changes MWF - JP output SS - +BM  FEN: IVF, FLD, advance to soft as tolerated ID: Flagyl 10/03-10/17; Maxipime 10/3-10/16 DVT: Lovenox /SCD Foley:wick Follow up: Dr. Donne Hazel  Plan: advance diet as tolerated, will see wound tomorrow at vac change.    LOS: 12 days    Subjective: CC: abdominal soreness  Had a BM. No issues overnight. No N or V. Tolerating CLD.   Objective: Vital signs in last 24 hours: Temp:  [97.8 F (36.6 C)-98.1 F (36.7 C)] 98.1 F (36.7 C) (10/15 0535) Pulse Rate:  [58-74] 61 (10/15 0535) Resp:  [18-20] 18 (10/15 0535) BP: (108-138)/(56-75) 108/56 (10/15 0535) SpO2:  [98 %-100 %] 98 % (10/15 0535) Weight:  [93.4 kg] 93.4 kg (10/14 1133) Last BM Date: 04/16/18  Intake/Output from previous day: 10/14 0701 - 10/15 0700 In: 1275.7 [P.O.:150; I.V.:646.4; IV Piggyback:479.3] Out: 1856 [Urine:1201; Drains:655] Intake/Output this shift: No intake/output data recorded.  PE: Gen:  Alert, NAD, pleasant, cooperative Pulm:  Rate and effort normal Abd: Soft, obese, ND, +BS, incision with vac in place, drain with minimal serosanguinous drainage, mild TTP RLQ without guarding, no peritonitis  Skin: no rashes noted, warm and dry   Anti-infectives: Anti-infectives (From admission, onward)   Start     Dose/Rate Route Frequency Ordered Stop   04/06/18 2000  ceFEPIme  (MAXIPIME) 1 g in sodium chloride 0.9 % 100 mL IVPB     1 g 200 mL/hr over 30 Minutes Intravenous Every 24 hours 04/05/18 2005 04/18/18 2359   04/06/18 0600  metroNIDAZOLE (FLAGYL) IVPB 500 mg     500 mg 100 mL/hr over 60 Minutes Intravenous Every 8 hours 04/05/18 1957 04/19/18 0959   04/05/18 1930  ceFEPIme (MAXIPIME) 2 g in sodium chloride 0.9 % 100 mL IVPB     2 g 200 mL/hr over 30 Minutes Intravenous  Once 04/05/18 1921 04/06/18 0104   04/05/18 1930  metroNIDAZOLE (FLAGYL) IVPB 500 mg     500 mg 100 mL/hr over 60 Minutes Intravenous  Once 04/05/18 1921 04/05/18 2236      Lab Results:  Recent Labs    04/16/18 0403 04/17/18 0304  WBC 7.9 6.8  HGB 8.9* 8.8*  HCT 27.2* 27.1*  PLT 427* 433*   BMET Recent Labs    04/16/18 0403 04/17/18 0304  NA 139 137  K 3.7 3.6  CL 111 111  CO2 22 22  GLUCOSE 88 124*  BUN 6* 10  CREATININE 1.10* 1.02*  CALCIUM 8.0* 8.0*   PT/INR No results for input(s): LABPROT, INR in the last 72 hours. CMP     Component Value Date/Time   NA 137 04/17/2018 0304   K 3.6 04/17/2018 0304   CL 111 04/17/2018 0304   CO2 22 04/17/2018 0304   GLUCOSE 124 (H) 04/17/2018 0304   BUN 10 04/17/2018 0304   CREATININE 1.02 (H) 04/17/2018 0304   CALCIUM 8.0 (L) 04/17/2018 0304   PROT 5.3 (L) 04/17/2018 0304   ALBUMIN 2.1 (L) 04/17/2018 0304  AST 28 04/17/2018 0304   ALT 15 04/17/2018 0304   ALKPHOS 90 04/17/2018 0304   BILITOT 0.4 04/17/2018 0304   GFRNONAA 51 (L) 04/17/2018 0304   GFRAA 59 (L) 04/17/2018 0304   Lipase     Component Value Date/Time   LIPASE 29 04/05/2018 1301    Studies/Results: No results found.    Kalman Drape , [facility] 04/17/2018, 8:44 AM  Pager: [phone] Mon-Wed, Friday 7:00am-4:30pm Thurs 7am-11:30am  Consults: [phone]

## 2018-04-17 NOTE — Progress Notes (Addendum)
Physical Therapy Treatment Patient Details Name: Joan Elliott MRN: [id number] DOB: 11-Oct-1940 Today's Date: 04/17/2018    History of Present Illness Pt is a 77 y/o female admitted secondry to worsen in RLQ pain. Thought to have acute perforated appendicitis with abscess, however, a mass has not been ruled out. IR denied drain placement per notes.OR 04/11/18 for colectomy and VAC placed.  PMH includes CKD and HTN.      PT Comments    Pt sitting on EoB after using BSC and willing to do minimal therapy today due to increased abdominal pain. Focus of therapy LE strengthening and balance. Pt is worried about son who was just hospitalized and her current health condition. Recommended chaplin consult to RN.     Follow Up Recommendations  Home health PT;Supervision for mobility/OOB     Equipment Recommendations       Recommendations for Other Services OT consult     Precautions / Restrictions Precautions Precautions: Other (comment) Precaution Comments: wound vac Restrictions Weight Bearing Restrictions: No    Mobility  Bed Mobility Overal bed mobility: Needs Assistance Bed Mobility: Sit to Supine       Sit to supine: Min assist   General bed mobility comments: sitting EoB on entry, min A to bring LE into bed at end of session   Transfers Overall transfer level: Needs assistance Equipment used: Rolling walker (2 wheeled) Transfers: Sit to/from Stand Sit to Stand: Supervision            Ambulation/Gait             General Gait Details: pt request not to ambulate due to increased pain         Balance Overall balance assessment: Needs assistance Sitting-balance support: No upper extremity supported;Feet supported Sitting balance-Leahy Scale: Normal     Standing balance support: Bilateral upper extremity supported;During functional activity Standing balance-Leahy Scale: Poor Standing balance comment: requires UE support          [place] - Eyes  Opened: 20 [place] - Eyes Closed: 10                Cognition Arousal/Alertness: Awake/alert Behavior During Therapy: WFL for tasks assessed/performed Overall Cognitive Status: Within Functional Limits for tasks assessed                                        Exercises General Exercises - Lower Extremity Hip Flexion/Marching: 10 reps;Standing;AROM Mini-Sqauts: 5 reps;AROM    General Comments General comments (skin integrity, edema, etc.): currently with lines and drain to wound vac      Pertinent Vitals/Pain Pain Assessment: Faces Faces Pain Scale: Hurts whole lot Pain Location: lower R abdomen  Pain Descriptors / Indicators: Aching Pain Intervention(s): Monitored during session;Limited activity within patient's tolerance;Repositioned    Home Living                      Prior Function            PT Goals (current goals can now be found in the care plan section) Acute Rehab PT Goals Patient Stated Goal: to understand what is happening to her PT Goal Formulation: With patient Time For Goal Achievement: 04/23/18 Potential to Achieve Goals: Good Progress towards PT goals: Not progressing toward goals - comment(pt limited by pain)    Frequency    Min 3X/week  PT Plan Current plan remains appropriate       AM-PAC PT "6 Clicks" Daily Activity  Outcome Measure  Difficulty turning over in bed (including adjusting bedclothes, sheets and blankets)?: A Little Difficulty moving from lying on back to sitting on the side of the bed? : A Little Difficulty sitting down on and standing up from a chair with arms (e.g., wheelchair, bedside commode, etc,.)?: A Little Help needed moving to and from a bed to chair (including a wheelchair)?: A Little Help needed walking in hospital room?: A Little Help needed climbing 3-5 steps with a railing? : A Lot 6 Click Score: 17    End of Session Equipment Utilized During Treatment: Gait  belt Activity Tolerance: Patient limited by fatigue;Patient limited by lethargy Patient left: in bed;with call bell/phone within reach;with family/visitor present(sitting EOB ) Nurse Communication: Mobility status PT Visit Diagnosis: Muscle weakness (generalized) (M62.81);Other abnormalities of gait and mobility (R26.89);Pain     Time: 1530-1557 PT Time Calculation (min) (ACUTE ONLY): 27 min  Charges:  $Therapeutic Exercise: 23-37 mins                     Issa Luster B. Migdalia Dk PT, DPT Acute Rehabilitation Services Pager [phone] Office [phone]    [place] 04/17/2018, 5:10 PM

## 2018-04-17 NOTE — Progress Notes (Signed)
[facility] CONSULT NOTE   Pharmacy Consult for TPN Indication: prolonged ileus  Patient Measurements: Height: 5' (152.4 cm) Weight: 205 lb 14.6 oz (93.4 kg) IBW/kg (Calculated) : 45.5 TPN AdjBW (KG): 57.3 Body mass index is 40.21 kg/m.  Assessment:  7 yof presented to the hospital with worsening RLQ abdominal pain with associated anorexia and poor PO intake. Found to have R colonic perforation with mass. S/p diagnostic laparoscopy with LOA, R colectomy and placement of R retroperitoneal abscess drain. To start TPN for nutrition support.   GI: s/p diagnostic laparoscopy with LOA, R colectomy with abscess drain placement - drain OP 655, albumin 2.1 - famotidine Endo: No hx of DM - will monitor CBGs with start of TPN; synthroid (TSH WNL) Insulin requirements in the past 24 hours: 6 units Lytes: WNL Renal: SCr 1.02 (at baseline), UOP 0.5 Pulm: RA Cards: VSS - hctz, losartan, metoprolol Hepatobil: LFTs WNL, TG WNL, prealbumin 15.4 Neuro: A&Ox4 - gabapentin ID: Flagyl/cefepime for 7 days post-op. Stop dates on place. Afebrile, WBC WNL  TPN Access: CVC TPN start date: 10/14>> Nutritional Goals (per RD recommendation on 10/14): KCal: 1650-1850 Protein: 90-105 g Fluid: >1.7 L  Goal TPN rate is 70 ml/hr (provides 92g protein, 1673 kcal)  Current Nutrition:  Clear liquid diet Boost Breeze po TID, each supplement provides 250 kcal and 9 grams of protein TPN  Plan:  Increase TPN to 52mL/hr This TPN provides 66 g of protein, 168 g of dextrose, and 36 g of lipids which provides 1195 kCals per day, meeting 71% of patient needs. Along with Boost Breeze TID, this will meet 100% of nutrition needs Electrolytes in TPN: standard electrolytes, will receive more with increasing rate Add MVI, trace elements DC famotidine and add to TPN Continue sensitive SSI and adjust as needed Monitor TPN labs  Makeba Delcastillo, Rande Lawman 04/17/2018,9:28 AM

## 2018-04-17 NOTE — Progress Notes (Signed)
Patient ID: Joan Elliott, female   DOB: 12-03-1940, 77 y.o.   MRN: [id number]  PROGRESS NOTE    Joan Elliott  WNI:[id number] DOB: 1940-09-30 DOA: 04/05/2018 PCP: [name], MD   Brief Narrative:  77 year old female with history of chronic kidney disease stage III, hypertension, stroke, CAD presented on 04/05/2018 with abdominal pain.  She was admitted with probable perforated appendicitis and probable underlying mass.  General surgery was consulted.  Patient was started on IV antibiotics.  IR was consulted but there was no abscess to be drained.  Patient continued to have abdominal pain.  She underwent diagnostic laparoscopy with lysis of adhesions and open right colectomy on 04/11/2018 by general surgery.   Assessment & Plan:   Principal Problem:   Acute perforated appendicitis Active Problems:   CKD (chronic kidney disease) stage 3, GFR 30-59 ml/min (HCC)   AKI (acute kidney injury) ([place])   HTN (hypertension)   Localized right colonic perforation causing abscess/mass -Status post diagnostic laparoscopy with lysis of adhesions and open right colectomy on 04/11/2018 by general surgery.  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  Diet advancement as per general surgery. Apetite is still very poor.  Continue TPN which was started on 04/16/2018 as per general surgery.  Wound care as per general surgery.  Continue cefepime and Flagyl for a total of 7 days postop as per general surgery recommendations.  Pain management.  Patient had bowel movement yesterday.  Leukocytosis -Probably reactive.  Resolved.  Repeat a.m. labs  Acute anemia -Probably from GI blood loss in the setting of perforated colon/appendix -Hemoglobin stable.  Monitor  Acute kidney injury on chronic kidney disease stage III-IV -No evidence of nephrolithiasis or hydronephrosis on CT of the abdomen and pelvis.   -Improved.   Monitor -Home losartan/hydrochlorothiazide resumed on 04/16/2018.  CAD with stent in RCA and history of CVA, history of PVD -Continue beta-blocker and statin.  Plavix held probably for surgery.  Will resume Plavix only when cleared by general surgery.  Chronic diastolic CHF -Currently compensated.  Monitor strict input and output.  Continue metoprolol.  Losartan/hydrochlorthiazide resumed on 04/16/2018.  Hypertension -Blood pressure improved.  Continue metoprolol and losartan/hydrochlorothiazide.  COPD with tobacco abuse -Stable.  No wheezing or hypoxia  Hypothyroidism On Synthroid  Gout -Stable on allopurinol  Obesity -Outpatient follow-up  DVT prophylaxis: Lovenox Code Status: Full Family Communication: None at bedside Disposition Plan: Depends on clinical outcome  Consultants: General surgery/IR  Procedures: diagnostic laparoscopy with lysis of adhesions and open right colectomy on 04/11/2018 by general surgery  Antimicrobials: Cefepime and Flagyl from 04/05/2018 onwards   Subjective: Patient seen and examined at bedside.  She feels slightly better with slightly better appetite.  She had a bowel movement yesterday.  Her abdominal pain is improving.  No overnight fever or vomiting. Objective: Vitals:   04/16/18 1739 04/16/18 1935 04/17/18 0535 04/17/18 0956  BP: 117/62 113/62 (!) 108/56 111/72  Pulse: 63 64 61 81  Resp: 18 18 18    Temp: 97.8 F (36.6 C) 97.9 F (36.6 C) 98.1 F (36.7 C)   TempSrc: Oral Oral Axillary   SpO2: 99% 100% 98%   Weight:      Height:        Intake/Output Summary (Last 24 hours) at 04/17/2018 1052 Last data filed at 04/17/2018 1032 Gross per 24 hour  Intake 1425.73 ml  Output 1831 ml  Net -405.27 ml   [employer]  04/09/18 0314 04/15/18 0619 04/16/18 1133  Weight: 92.5 kg 96.7 kg 93.4 kg    Examination:  General exam: No acute distress.  Awake and alert respiratory system: Bilateral decreased breath sounds at bases with  scattered crackles, no wheezing Cardiovascular system: Rate controlled, S1-S2 heard gastrointestinal system: Abdomen is nondistended, soft and mildly tender in the right lower quadrant.  Bowel sounds sluggish.  JP drain and wound VAC present extremities: No cyanosis, edema    Data Reviewed: I have personally reviewed following labs and imaging studies  CBC: Recent Labs  Lab 04/12/18 0351 04/13/18 0327 04/14/18 0419 04/15/18 0401 04/16/18 0403 04/17/18 0304  WBC 14.6* 11.5* 7.9 7.6 7.9 6.8  NEUTROABS 13.7*  --  5.9 5.7 6.2 4.5  HGB 10.0* 8.7* 8.1* 8.6* 8.9* 8.8*  HCT 30.5* 26.6* 26.1* 26.8* 27.2* 27.1*  MCV 84.0 84.4 85.9 85.1 84.7 84.4  PLT 394 370 362 398 427* 921*   Basic Metabolic Panel: Recent Labs  Lab 04/13/18 0327 04/14/18 0419 04/15/18 0401 04/16/18 0403 04/17/18 0304  NA 141 141 139 139 137  K 4.2 3.9 3.7 3.7 3.6  CL 110 111 111 111 111  CO2 24 23 22 22 22   GLUCOSE 118* 92 94 88 124*  BUN 17 9 5* 6* 10  CREATININE 1.44* 1.19* 1.12* 1.10* 1.02*  CALCIUM 8.2* 8.0* 8.0* 8.0* 8.0*  MG 1.9 1.8 1.8 1.7 1.7  PHOS  --   --   --   --  2.6   GFR: Estimated Creatinine Clearance: 45.7 mL/min (A) (by C-G formula based on SCr of 1.02 mg/dL (H)). Liver Function Tests: Recent Labs  Lab 04/17/18 0304  AST 28  ALT 15  ALKPHOS 90  BILITOT 0.4  PROT 5.3*  ALBUMIN 2.1*   No results for input(s): LIPASE, AMYLASE in the last 168 hours. No results for input(s): AMMONIA in the last 168 hours. Coagulation Profile: No results for input(s): INR, PROTIME in the last 168 hours. Cardiac Enzymes: No results for input(s): CKTOTAL, CKMB, CKMBINDEX, TROPONINI in the last 168 hours. BNP (last 3 results) No results for input(s): PROBNP in the last 8760 hours. HbA1C: No results for input(s): HGBA1C in the last 72 hours. CBG: Recent Labs  Lab 04/16/18 1735 04/16/18 2038 04/17/18 0002 04/17/18 0401 04/17/18 0739  GLUCAP 182* 171* 134* 130* 126*   Lipid Profile: Recent  Labs    04/17/18 0304  TRIG 97   Thyroid Function Tests: No results for input(s): TSH, T4TOTAL, FREET4, T3FREE, THYROIDAB in the last 72 hours. Anemia Panel: No results for input(s): VITAMINB12, FOLATE, FERRITIN, TIBC, IRON, RETICCTPCT in the last 72 hours. Sepsis Labs: No results for input(s): PROCALCITON, LATICACIDVEN in the last 168 hours.  Recent Results (from the past 240 hour(s))  Surgical pcr screen     Status: None   Collection Time: 04/11/18 11:34 AM  Result Value Ref Range Status   MRSA, PCR NEGATIVE NEGATIVE Final   Staphylococcus aureus NEGATIVE NEGATIVE Final    Comment: (NOTE) The Xpert SA Assay (FDA approved for NASAL specimens in patients 1 years of age and older), is one component of a comprehensive surveillance program. It is not intended to diagnose infection nor to guide or monitor treatment. Performed at [facility], [place] [address] [place], [place] [address]          Radiology Studies: No results found.      Scheduled Meds: . acetaminophen  1,000 mg Oral Q8H  . enoxaparin (LOVENOX) injection  40  mg Subcutaneous Q24H  . feeding supplement  1 Container Oral TID BM  . gabapentin  100 mg Oral QHS  . hydrochlorothiazide  12.5 mg Oral Daily   And  . losartan  100 mg Oral Daily  . insulin aspart  0-9 Units Subcutaneous Q4H  . levothyroxine  50 mcg Oral QAC breakfast  . metoprolol tartrate  12.5 mg Oral BID  . oxybutynin  5 mg Oral BID  . sodium chloride flush  10-40 mL Intracatheter Q12H   Continuous Infusions: . sodium chloride Stopped (04/17/18 0242)  . ceFEPime (MAXIPIME) IV Stopped (04/16/18 2106)  . methocarbamol (ROBAXIN) IV 500 mg (04/17/18 0539)  . metronidazole 500 mg (04/17/18 1018)  . TPN ADULT (ION) 40 mL/hr at 04/17/18 0300  . TPN ADULT (ION)       LOS: 12 days        Aline August, MD [facility] Pager [phone]  If 7PM-7AM, please contact night-coverage [email or web address] Password [email or web address] 04/17/2018,  10:52 AM

## 2018-04-18 DIAGNOSIS — N183 Chronic kidney disease, stage 3 (moderate): Secondary | ICD-10-CM

## 2018-04-18 DIAGNOSIS — K3532 Acute appendicitis with perforation and localized peritonitis, without abscess: Secondary | ICD-10-CM

## 2018-04-18 DIAGNOSIS — I10 Essential (primary) hypertension: Secondary | ICD-10-CM

## 2018-04-18 LAB — BASIC METABOLIC PANEL
ANION GAP: 6 (ref 5–15)
BUN: 10 mg/dL (ref 8–23)
CALCIUM: 8.3 mg/dL — AB (ref 8.9–10.3)
CO2: 22 mmol/L (ref 22–32)
Chloride: 110 mmol/L (ref 98–111)
Creatinine, Ser: 1.06 mg/dL — ABNORMAL HIGH (ref 0.44–1.00)
GFR calc Af Amer: 56 mL/min — ABNORMAL LOW (ref >60)
GFR, EST NON AFRICAN AMERICAN: 49 mL/min — AB (ref >60)
GLUCOSE: 147 mg/dL — AB (ref 70–99)
Potassium: 3.8 mmol/L (ref 3.5–5.1)
Sodium: 138 mmol/L (ref 135–145)

## 2018-04-18 LAB — URINALYSIS, ROUTINE W REFLEX MICROSCOPIC
Bilirubin Urine: NEGATIVE
GLUCOSE, UA: NEGATIVE mg/dL
HGB URINE DIPSTICK: NEGATIVE
Ketones, ur: NEGATIVE mg/dL
LEUKOCYTES UA: NEGATIVE
Nitrite: NEGATIVE
PH: 5 (ref 5.0–8.0)
PROTEIN: NEGATIVE mg/dL
SPECIFIC GRAVITY, URINE: 1.012 (ref 1.005–1.030)

## 2018-04-18 LAB — GLUCOSE, CAPILLARY
GLUCOSE-CAPILLARY: 139 mg/dL — AB (ref 70–99)
GLUCOSE-CAPILLARY: 128 mg/dL — AB (ref 70–99)
GLUCOSE-CAPILLARY: 139 mg/dL — AB (ref 70–99)
Glucose-Capillary: 109 mg/dL — ABNORMAL HIGH (ref 70–99)
Glucose-Capillary: 123 mg/dL — ABNORMAL HIGH (ref 70–99)
Glucose-Capillary: 138 mg/dL — ABNORMAL HIGH (ref 70–99)

## 2018-04-18 LAB — CBC WITH DIFFERENTIAL/PLATELET
Abs Immature Granulocytes: 0.11 10*3/uL — ABNORMAL HIGH (ref 0.00–0.07)
Basophils Absolute: 0 10*3/uL (ref 0.0–0.1)
Basophils Relative: 0 %
EOS PCT: 2 %
Eosinophils Absolute: 0.2 10*3/uL (ref 0.0–0.5)
HEMATOCRIT: 26.3 % — AB (ref 36.0–46.0)
HEMOGLOBIN: 8.6 g/dL — AB (ref 12.0–15.0)
Immature Granulocytes: 1 %
LYMPHS ABS: 1.4 10*3/uL (ref 0.7–4.0)
Lymphocytes Relative: 18 %
MCH: 27.6 pg (ref 26.0–34.0)
MCHC: 32.7 g/dL (ref 30.0–36.0)
MCV: 84.3 fL (ref 80.0–100.0)
MONO ABS: 0.7 10*3/uL (ref 0.1–1.0)
MONOS PCT: 9 %
Neutro Abs: 5.3 10*3/uL (ref 1.7–7.7)
Neutrophils Relative %: 70 %
Platelets: 436 10*3/uL — ABNORMAL HIGH (ref 150–400)
RBC: 3.12 MIL/uL — ABNORMAL LOW (ref 3.87–5.11)
RDW: 15.5 % (ref 11.5–15.5)
WBC: 7.7 10*3/uL (ref 4.0–10.5)
nRBC: 0 % (ref 0.0–0.2)

## 2018-04-18 LAB — PHOSPHORUS: Phosphorus: 3.1 mg/dL (ref 2.5–4.6)

## 2018-04-18 LAB — MAGNESIUM: Magnesium: 1.7 mg/dL (ref 1.7–2.4)

## 2018-04-18 MED ORDER — GABAPENTIN 100 MG PO CAPS: 100.0000 mg | ORAL_CAPSULE | Freq: Every day | ORAL | Status: DC

## 2018-04-18 MED ORDER — DOCUSATE SODIUM 100 MG PO CAPS
100.0000 mg | ORAL_CAPSULE | Freq: Two times a day (BID) | ORAL | Status: DC
  Administered 2018-04-18 – 2018-04-24 (×8): 100 mg via ORAL
  Filled 2018-04-18 (×12): qty 1

## 2018-04-18 MED ORDER — METHOCARBAMOL 750 MG PO TABS
750.0000 mg | ORAL_TABLET | Freq: Three times a day (TID) | ORAL | Status: DC
  Administered 2018-04-18 – 2018-04-20 (×5): 750 mg via ORAL
  Filled 2018-04-18 (×7): qty 1

## 2018-04-18 MED ORDER — GABAPENTIN 100 MG PO CAPS: 200.0000 mg | ORAL_CAPSULE | Freq: Every day | ORAL | Status: DC

## 2018-04-18 MED ORDER — PHENAZOPYRIDINE HCL 100 MG PO TABS
100.0000 mg | ORAL_TABLET | Freq: Three times a day (TID) | ORAL | Status: DC | PRN
  Filled 2018-04-18: qty 1

## 2018-04-18 MED ORDER — GABAPENTIN 100 MG PO CAPS
100.0000 mg | ORAL_CAPSULE | Freq: Three times a day (TID) | ORAL | Status: DC
  Administered 2018-04-18 – 2018-04-24 (×17): 100 mg via ORAL
  Filled 2018-04-18 (×19): qty 1

## 2018-04-18 MED ORDER — TRAVASOL 10 % IV SOLN
INTRAVENOUS | Status: AC
  Administered 2018-04-18: 17:00:00 via INTRAVENOUS
  Filled 2018-04-18: qty 462

## 2018-04-18 MED ORDER — ENSURE ENLIVE PO LIQD
237.0000 mL | Freq: Two times a day (BID) | ORAL | Status: DC
  Administered 2018-04-18 – 2018-04-19 (×2): 237 mL via ORAL
  Filled 2018-04-18: qty 237

## 2018-04-18 NOTE — Progress Notes (Signed)
Wound check  VAC removed from midline wound and midline dressing converted to saline WTD as granulation tissue almost to skin level. Will write orders for BID dressing changes to start tomorrow AM. RLQ JP drain also removed without complication and dry dressing applied.  Brigid Re , [facility] Surgery 04/18/2018, 2:38 PM Pager: [phone] Consults: [phone] Mon-Fri 7:00 am-4:30 pm Sat-Sun 7:00 am-11:30 am

## 2018-04-18 NOTE — Progress Notes (Signed)
Occupational Therapy Treatment Patient Details Name: Joan Elliott MRN: [id number] DOB: 28-Jun-1941 Today's Date: 04/18/2018    History of present illness Pt is a 77 y/o female admitted secondry to worsen in RLQ pain. Thought to have acute perforated appendicitis with abscess, however, a mass has not been ruled out. IR denied drain placement per notes.OR 04/11/18 for colectomy and VAC placed.  PMH includes CKD and HTN.     OT comments  Pt with lunch arriving and reports does not like spaghetti, yet when attempting to order a second tray, pt begins to eat lunch. Pt unable to recall any information provided by the registered dietian. Pt states "I don't remember any of it to be honest." pt with poor recall of information. OT attempting teach back with repeating task or having patient demonstrate to help with recall.    Follow Up Recommendations  Home health OT    Equipment Recommendations  3 in 1 bedside commode    Recommendations for Other Services      Precautions / Restrictions Precautions Precautions: Other (comment) Precaution Comments: wound vac       Mobility Bed Mobility               General bed mobility comments: in chair on arrival  Transfers Overall transfer level: Needs assistance Equipment used: Rolling walker (2 wheeled) Transfers: Sit to/from Stand   Stand pivot transfers: Min guard       General transfer comment: requires RW    Balance           Standing balance support: Bilateral upper extremity supported;During functional activity Standing balance-Leahy Scale: Poor Standing balance comment: requires UE support                            ADL either performed or assessed with clinical judgement   ADL Overall ADL's : Needs assistance/impaired     Grooming: Wash/dry hands;Wash/dry face;Min guard;Sitting                   Toilet Transfer: Minimal Therapist, art Details (indicate cue type  and reason): mod cues for RW and grab bars Toileting- Clothing Manipulation and Hygiene: Minimal assistance;Sit to/from stand       Functional mobility during ADLs: Passenger transport manager     Praxis      Cognition Arousal/Alertness: Awake/alert Behavior During Therapy: WFL for tasks assessed/performed Overall Cognitive Status: Within Functional Limits for tasks assessed                                          Exercises     Shoulder Instructions       General Comments      Pertinent Vitals/ Pain       Pain Assessment: Faces Faces Pain Scale: Hurts a little bit Pain Location: lower R abdomen  Pain Descriptors / Indicators: Aching Pain Intervention(s): Monitored during session;Premedicated before session;Repositioned  Home Living                                          Prior Functioning/Environment              Frequency  Min 2X/week        Progress Toward Goals  OT Goals(current goals can now be found in the care plan section)  Progress towards OT goals: Progressing toward goals  Acute Rehab OT Goals Patient Stated Goal: none stated OT Goal Formulation: With patient Time For Goal Achievement: 04/25/18 Potential to Achieve Goals: Good ADL Goals Pt Will Perform Lower Body Bathing: with supervision;sit to/from stand;with adaptive equipment Pt Will Perform Lower Body Dressing: with supervision;with adaptive equipment;sit to/from stand Pt Will Transfer to Toilet: with supervision;ambulating;bedside commode Additional ADL Goal #1: pt will demonstrate energy conservation x2 during adl task and verbalize strategy being used  Plan Discharge plan remains appropriate    Co-evaluation                 AM-PAC PT "6 Clicks" Daily Activity     Outcome Measure   Help from another person eating meals?: None Help from another person taking care of personal grooming?: None Help from  another person toileting, which includes using toliet, bedpan, or urinal?: A Little Help from another person bathing (including washing, rinsing, drying)?: A Lot Help from another person to put on and taking off regular upper body clothing?: A Little Help from another person to put on and taking off regular lower body clothing?: A Lot 6 Click Score: 18    End of Session Equipment Utilized During Treatment: Gait belt  OT Visit Diagnosis: Unsteadiness on feet (R26.81);Muscle weakness (generalized) (M62.81)   Activity Tolerance Patient tolerated treatment well   Patient Left in chair;with call bell/phone within reach   Nurse Communication Mobility status;Precautions        Time: 6226-3335 OT Time Calculation (min): 13 min  Charges: OT General Charges $OT Visit: 1 Visit OT Treatments $Self Care/Home Management : 8-22 mins   Jeri Modena, OTR/L  Acute Rehabilitation Services Pager: [phone] Office: [phone] .    Parke Poisson B 04/18/2018, 3:37 PM

## 2018-04-18 NOTE — Progress Notes (Signed)
PHARMACY - ADULT TOTAL PARENTERAL NUTRITION CONSULT NOTE   Pharmacy Consult:  TPN Indication: Prolonged ileus  Patient Measurements: Height: 5' (152.4 cm) Weight: 205 lb 14.6 oz (93.4 kg) IBW/kg (Calculated) : 45.5 TPN AdjBW (KG): 57.3 Body mass index is 40.21 kg/m.  Assessment:  39 yof presented to the hospital with worsening RLQ abdominal pain with associated anorexia and poor PO intake. Found to have R colonic perforation with mass. S/p diagnostic laparoscopy with LOA, R colectomy and placement of R retroperitoneal abscess drain. To start TPN for nutrition support.   GI:  prealbumin 15.4, drain OP down to 30mL, +BM.  Pepcid in TPN. Endo: Synthroid, TSH WNL.  No hx DM - CBGs controlled (one elevated last night) Insulin requirements in the past 24 hours: 8 units Lytes: K 3.8 (goal >/= 4 for ileus), Mag 1.7 (goal >/= 2 for ileus), others WNL Renal: SCr 1.06 stable, BUN WNL - UOP 0.7 ml/kg/hr Pulm: stable on RA Cards: VSS - hctz, losartan, metoprolol Hepatobil: LFTs WNL, TG WNL Neuro: A&Ox4 - gabapentin, APAP, Robaxin, PRN Dilaudid ID: Flagyl/Cefepime x7 days post-op. Afebrile, WBC WNL TPN Access: CVC TPN start date: 04/16/18  Nutritional Goals (per RD rec on 10/14): 1650-1850 kCal, 90-105 gm protein, >1.7 L fluid per day  Current Nutrition:  Soft diet Boost Breeze TID, each supplement provides 250 kcal and 9 grams of protein TPN  Plan:   Reduce TPN to 35 ml/hr per discussion with Surgery (goal rate 70 ml/hr) TPN will provide 46g AA, 118g CHO and 25g ILE for a total of 837 kCal, meeting ~50% of patient's needs. Electrolytes in TPN: increase K and Mag - fluctuation with rate reduction Daily multivitamin, trace elements, and Pepcid 20mg  to TPN Continue sensitive SSI Q4H F/U AM labs, PO intake to D/C TPN in AM   Joan Pevehouse D. Mina Elliott, PharmD, BCPS, [place] 04/18/2018, 10:56 AM

## 2018-04-18 NOTE — Progress Notes (Signed)
Nutrition Follow-up  DOCUMENTATION CODES:   Obesity unspecified  INTERVENTION:   -D/c Boost Breeze po TID, each supplement provides 250 kcal and 9 grams of protein -Ensure Enlive po TID, each supplement provides 350 kcal and 20 grams of protein -TPN management per pharmacy  NUTRITION DIAGNOSIS:   Inadequate oral intake related to altered GI function, decreased appetite as evidenced by meal completion < 50%, per patient/family report.  Progressing  GOAL:   Patient will meet greater than or equal to 90% of their needs  Progressing  MONITOR:   PO intake, Supplement acceptance, Diet advancement, Labs, Weight trends, Skin, I & O's  REASON FOR ASSESSMENT:   Consult New TPN/TNA  ASSESSMENT:   Joan Elliott is a 77 y.o. female with medical history significant of CKD stage 3, HTN, stroke, CAD. Patient presents to the ED with ~7 day history of worsening RLQ abd pain.  Associated anorexia and poor PO intake.  Passing gas but no BM in past couple of days.  Symptoms worsening, nothing makes it better nor worse.  Had nausea without vomiting.  Had chills but no fevers.  Urine color change over past week.  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   Case discussed with RN, who reports pt is tolerating PO diet well. Pt was just increased to soft diet today per RN.   Spoke with pt at bedside, who is in good spirits. She just finished walking hallways with mobility tech. Observed meal tray from breakfast- pt consumed 100% of oatmeal and a bite of sausage and toast ("just so I could get the taste"). Pt reports  that she is not really that hungry, but happy to be eating solid foods. She shares that she is consuming the Boost Breeze supplements (consumed about 50% of AM dose), however, would prefer strawberry Ensure.   Pt remains on TPN- currently receiving at 50 ml/hr, which provides 1195 kcals and 66 grams of protein, which meets 72% of estimated kcal needs and 73% of estimated protein needs. Per pharmacy note, plan to decrease TPN to 35 ml/hr at 1800, which will provide 834 kcals and 46 grams protein (meeting 51% of estimated kcal needs and 51% of estimated protein needs).   Discussed with pt importance of good meal and supplement intake to promote healing.   Labs reviewed: CBGS: 109-217 (inpatient orders for glycemic control are 0-9 units insulin aspart every 4 hours).   NUTRITION - FOCUSED PHYSICAL EXAM:    Most Recent Value  Orbital Region  No depletion  Upper Arm Region  No depletion  Thoracic and Lumbar Region  No depletion  Buccal Region  No depletion  Temple Region  No depletion  Clavicle Bone Region  No depletion  Clavicle and Acromion Bone Region  No depletion  Scapular Bone Region  No depletion  Dorsal Hand  No depletion  Patellar Region  No depletion  Anterior Thigh Region  No depletion  Posterior Calf Region  No depletion  Edema (RD Assessment)  None  Hair  Reviewed  Eyes  Reviewed  Mouth  Reviewed  Skin  Reviewed  Nails  Reviewed       Diet Order:   Diet  Order            DIET SOFT Room service appropriate? Yes; Fluid consistency: Thin  Diet effective now              EDUCATION NEEDS:   No education needs have been identified at this time  Skin:  Skin Assessment: Skin Integrity Issues: Skin Integrity Issues:: Incisions, Wound VAC Wound Vac: abdomen Incisions: closed abdomen  Last BM:  04/16/18  Height:   Ht Readings from Last 1 Encounters:  04/16/18 5' (1.524 m)    Weight:   Wt Readings from Last 1 Encounters:  04/16/18 93.4 kg    Ideal Body  Weight:  45.5 kg  BMI:  Body mass index is 40.21 kg/m.  Estimated Nutritional Needs:   Kcal:  1650-1850  Protein:  90-105 grams  Fluid:  > 1.7 L    Leasha Goldberger A. Jimmye Norman, RD, LDN, CDE Pager: [phone] After hours Pager: [phone]

## 2018-04-18 NOTE — Progress Notes (Signed)
Patient ID: Joan Elliott, female   DOB: 21-Jul-1940, 77 y.o.   MRN: [id number]  PROGRESS NOTE    KIMIYAH BLICK  EGB:[id number] DOB: Nov 13, 1940 DOA: 04/05/2018 PCP: [name], MD   Brief Narrative:  77 year old female with history of chronic kidney disease stage III, hypertension, stroke, CAD presented on 04/05/2018 with abdominal pain.  She was admitted with probable perforated appendicitis and probable underlying mass.  General surgery was consulted.  Patient was started on IV antibiotics.  IR was consulted but there was no abscess to be drained.  Patient continued to have abdominal pain.  She underwent diagnostic laparoscopy with lysis of adhesions and open right colectomy on 04/11/2018 by general surgery.   Assessment & Plan:   Principal Problem:   Acute perforated appendicitis Active Problems:   CKD (chronic kidney disease) stage 3, GFR 30-59 ml/min (HCC)   AKI (acute kidney injury) ([place])   HTN (hypertension)   Localized right colonic perforation causing abscess/mass - Status post diagnostic laparoscopy with lysis of adhesions and open right colectomy on 04/11/2018 by general surgery.  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  While she was on bowel rest/poor oral intake, getting TPN which was started on 04/16/2018 as per general surgery.  Continue cefepime and Flagyl for a total of 7 days postop as per general surgery recommendations.   Postop patient had wound VAC and JP drain which were removed 10/16. Tolerating clear liquids and advance to soft diet 10/16.  Wean TPN. Had BM 2 days ago. Mobilize.  Leukocytosis -Likely related to primary problem i.e. colonic perforation with abscess.  Resolved.  Acute blood loss anemia -Probably from GI blood loss in the setting of perforated colon/appendix -Hemoglobin stable in the mid 8 g range for several days.  Acute kidney injury on chronic  kidney disease stage III-IV -No evidence of nephrolithiasis or hydronephrosis on CT of the abdomen and pelvis.   -Home losartan/hydrochlorothiazide resumed on 04/16/2018. -Acute kidney injury resolved.  Periodically monitor BMP.  CAD with stent in RCA and history of CVA, history of PVD - Continue beta-blocker and statin.  Plavix held probably for surgery.  Will resume Plavix only when cleared by general surgery- general surgery to advise.  Chronic diastolic CHF - Currently compensated.  Monitor strict input and output.  Continue metoprolol.  Losartan/hydrochlorthiazide resumed on 04/16/2018.  Essential hypertension -Controlled.  Continue metoprolol and losartan/hydrochlorothiazide.  COPD with tobacco abuse -Stable without clinical bronchospasm.    Hypothyroidism On Synthroid  Gout -Stable on allopurinol  Obesity/Body mass index is 40.21 kg/m. -Outpatient follow-up  Dysuria Patient currently does not have Foley catheter.  She has a pure week.  Has been on IV cefepime for a while now.  Agree with checking urine microscopy and culture.  Pyridium added by general surgery.  Monitor.    DVT prophylaxis: Lovenox Code Status: Full Family Communication: None at bedside Disposition Plan: DC home with home health services pending further clinical improvement.  Consultants:  General surgery/IR  Procedures:  diagnostic laparoscopy with lysis of adhesions and open right colectomy on 04/11/2018 by general surgery  Antimicrobials:  Cefepime and Flagyl from 04/05/2018 onwards   Subjective: Patient interviewed and examined with RN at bedside.  Reports mid abdominal pain, severe at times.  Tolerating clear liquids and wants something solid to eat.  Had BM 2 days ago.  No nausea or vomiting.  Reports dysuria.   Objective: Vitals:   04/17/18 1438  04/17/18 2015 04/18/18 0436 04/18/18 1320  BP: 113/61 122/61 121/68 115/64  Pulse: (!) 59 70 72 80  Resp: 18 18 17 16   Temp: 98.2 F (36.8  C) 98.2 F (36.8 C) 98 F (36.7 C) 97.9 F (36.6 C)  TempSrc: Oral Oral Oral Oral  SpO2: 98% 97% 99% 100%  Weight:      Height:        Intake/Output Summary (Last 24 hours) at 04/18/2018 1639 Last data filed at 04/18/2018 1554 Gross per 24 hour  Intake 1940.92 ml  Output 2095 ml  Net -154.08 ml   Filed Weights   04/09/18 0314 04/15/18 0619 04/16/18 1133  Weight: 92.5 kg 96.7 kg 93.4 kg    Examination:  General exam: Pleasant elderly female, moderately built and obese sitting at edge of bed with some pain/discomfort. Respiratory system: Slightly diminished breath sounds in the bases but no wheezing, rhonchi or crackles.  Rest of lung fields clear to auscultation.  No increased work of breathing. Cardiovascular system: S1 and S2 heard, RRR.  No JVD, murmurs or pedal edema. Gastrointestinal system: Abdomen is obese.  Minimal diffuse tenderness but no rigidity, guarding or rebound.  When I saw her this morning, still had midline wound VAC and JP drain which have since been removed.  Normal bowel sounds heard. CNS: Alert and oriented.  No focal neurological deficits. Extremities: No cyanosis, edema.  Symmetric 5 x 5 power.   Data Reviewed: I have personally reviewed following labs and imaging studies  CBC: Recent Labs  Lab 04/14/18 0419 04/15/18 0401 04/16/18 0403 04/17/18 0304 04/18/18 0343  WBC 7.9 7.6 7.9 6.8 7.7  NEUTROABS 5.9 5.7 6.2 4.5 5.3  HGB 8.1* 8.6* 8.9* 8.8* 8.6*  HCT 26.1* 26.8* 27.2* 27.1* 26.3*  MCV 85.9 85.1 84.7 84.4 84.3  PLT 362 398 427* 433* 967*   Basic Metabolic Panel: Recent Labs  Lab 04/14/18 0419 04/15/18 0401 04/16/18 0403 04/17/18 0304 04/18/18 0343  NA 141 139 139 137 138  K 3.9 3.7 3.7 3.6 3.8  CL 111 111 111 111 110  CO2 23 22 22 22 22   GLUCOSE 92 94 88 124* 147*  BUN 9 5* 6* 10 10  CREATININE 1.19* 1.12* 1.10* 1.02* 1.06*  CALCIUM 8.0* 8.0* 8.0* 8.0* 8.3*  MG 1.8 1.8 1.7 1.7 1.7  PHOS  --   --   --  2.6 3.1    GFR: Estimated Creatinine Clearance: 44 mL/min (A) (by C-G formula based on SCr of 1.06 mg/dL (H)). Liver Function Tests: Recent Labs  Lab 04/17/18 0304  AST 28  ALT 15  ALKPHOS 90  BILITOT 0.4  PROT 5.3*  ALBUMIN 2.1*    CBG: Recent Labs  Lab 04/17/18 1958 04/18/18 0004 04/18/18 0431 04/18/18 0812 04/18/18 1303  GLUCAP 217* 123* 139* 109* 128*   Lipid Profile: Recent Labs    04/17/18 0304  TRIG 97     Recent Results (from the past 240 hour(s))  Surgical pcr screen     Status: None   Collection Time: 04/11/18 11:34 AM  Result Value Ref Range Status   MRSA, PCR NEGATIVE NEGATIVE Final   Staphylococcus aureus NEGATIVE NEGATIVE Final    Comment: (NOTE) The Xpert SA Assay (FDA approved for NASAL specimens in patients 24 years of age and older), is one component of a comprehensive surveillance program. It is not intended to diagnose infection nor to guide or monitor treatment. Performed at [facility], [place] [address], [place], [place]          Radiology Studies: No results found.      Scheduled Meds: . acetaminophen  1,000 mg Oral Q8H  . docusate sodium  100 mg Oral BID  . enoxaparin (LOVENOX) injection  40 mg Subcutaneous Q24H  . feeding supplement (ENSURE ENLIVE)  237 mL Oral BID BM  . gabapentin  100 mg Oral TID  . hydrochlorothiazide  12.5 mg Oral Daily   And  . losartan  100 mg Oral Daily  . insulin aspart  0-9 Units Subcutaneous Q4H  . levothyroxine  50 mcg Oral QAC breakfast  . methocarbamol  750 mg Oral TID  . metoprolol tartrate  12.5 mg Oral BID  . oxybutynin  5 mg Oral BID  . sodium chloride flush  10-40 mL Intracatheter Q12H   Continuous Infusions: . ceFEPime (MAXIPIME) IV Stopped (04/17/18 2048)  . metronidazole 500 mg (04/18/18 1628)  . TPN ADULT (ION) 50 mL/hr at 04/18/18 0000  . TPN ADULT (ION)       LOS: 13 days      Vernell Leep, MD, FACP, [facility]. [facility] Pager [phone]  If 7PM-7AM,  please contact night-coverage [email or web address] Password [email or web address] 04/18/2018, 4:52 PM

## 2018-04-18 NOTE — Progress Notes (Addendum)
[facility] Progress Note  7 Days Post-Op  Subjective: CC: abdominal pain  Patient with abdominal pain this AM, patient states worse today. Feels like a pinching and scratching. Last BM 2 days ago. Not taking in a lot PO but tolerating, wants to try some real food. Discussed importance of mobilization. Patient also reporting dysuria.   Objective: Vital signs in last 24 hours: Temp:  [98 F (36.7 C)-98.2 F (36.8 C)] 98 F (36.7 C) (10/16 0436) Pulse Rate:  [59-81] 72 (10/16 0436) Resp:  [17-18] 17 (10/16 0436) BP: (111-122)/(61-72) 121/68 (10/16 0436) SpO2:  [97 %-99 %] 99 % (10/16 0436) Last BM Date: 04/16/18  Intake/Output from previous day: 10/15 0701 - 10/16 0700 In: 2180.9 [P.O.:600; I.V.:910.6; IV Piggyback:670.3] Out: 9528 [Urine:1600; Drains:45] Intake/Output this shift: Total I/O In: 60 [P.O.:60] Out: -   PE: Gen:  Alert, NAD, pleasant Card:  Regular rate and rhythm, pedal pulses 2+ BL Pulm:  Normal effort, clear to auscultation bilaterally Abd: Soft, generalized TTP, non-distended, bowel sounds hypoactive, JP in RLQ with SS drainage, VAC to midline Skin: warm and dry, no rashes  Psych: A&Ox3   Lab Results:  Recent Labs    04/17/18 0304 04/18/18 0343  WBC 6.8 7.7  HGB 8.8* 8.6*  HCT 27.1* 26.3*  PLT 433* 436*   BMET Recent Labs    04/17/18 0304 04/18/18 0343  NA 137 138  K 3.6 3.8  CL 111 110  CO2 22 22  GLUCOSE 124* 147*  BUN 10 10  CREATININE 1.02* 1.06*  CALCIUM 8.0* 8.3*   PT/INR No results for input(s): LABPROT, INR in the last 72 hours. CMP     Component Value Date/Time   NA 138 04/18/2018 0343   K 3.8 04/18/2018 0343   CL 110 04/18/2018 0343   CO2 22 04/18/2018 0343   GLUCOSE 147 (H) 04/18/2018 0343   BUN 10 04/18/2018 0343   CREATININE 1.06 (H) 04/18/2018 0343   CALCIUM 8.3 (L) 04/18/2018 0343   PROT 5.3 (L) 04/17/2018 0304   ALBUMIN 2.1 (L) 04/17/2018 0304   AST 28 04/17/2018 0304   ALT 15 04/17/2018 0304   ALKPHOS 90 04/17/2018 0304   BILITOT 0.4 04/17/2018 0304   GFRNONAA 49 (L) 04/18/2018 0343   GFRAA 56 (L) 04/18/2018 0343   Lipase     Component Value Date/Time   LIPASE 29 04/05/2018 1301       Studies/Results: No results found.  Anti-infectives: Anti-infectives (From admission, onward)   Start     Dose/Rate Route Frequency Ordered Stop   04/06/18 2000  ceFEPIme (MAXIPIME) 1 g in sodium chloride 0.9 % 100 mL IVPB     1 g 200 mL/hr over 30 Minutes Intravenous Every 24 hours 04/05/18 2005 04/18/18 2359   04/06/18 0600  metroNIDAZOLE (FLAGYL) IVPB 500 mg     500 mg 100 mL/hr over 60 Minutes Intravenous Every 8 hours 04/05/18 1957 04/19/18 0959   04/05/18 1930  ceFEPIme (MAXIPIME) 2 g in sodium chloride 0.9 % 100 mL IVPB     2 g 200 mL/hr over 30 Minutes Intravenous  Once 04/05/18 1921 04/06/18 0104   04/05/18 1930  metroNIDAZOLE (FLAGYL) IVPB 500 mg     500 mg 100 mL/hr over 60 Minutes Intravenous  Once 04/05/18 1921 04/05/18 2236       Assessment/Plan Hypertension Chronic kidney disease stage III/acute kidney injury Anemia CAD with history of stents Chronic diastolic congestive heart failure COPD with history of tobacco use Hxof CVA/PVD  Hypothyroid Hx gout BMI 39.8  Localized right colonic perforation with mass - S/P Diagnostic laparoscopy, LOA, R colectomy, placement of drain,04/11/2018 Dr. 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 Dysuria - will start pyridium, check UA and culture, continue BS abx for now  FEN: soft diet, wean TPN ID: Flagyl 10/03-10/17; Maxipime 10/3-10/16 DVT: Lovenox /SCD Foley:wick Follow up: Dr. Donne Hazel  Plan: advance diet and wean TPN. Will see for VAC change later today. Pain control and mobilize  more. UA and cx.   LOS: 13 days    Brigid Re , [facility] 04/18/2018, 9:43 AM Pager: [phone] Consults: [phone] Mon-Fri 7:00 am-4:30 pm Sat-Sun 7:00 am-11:30 am

## 2018-04-19 ENCOUNTER — Inpatient Hospital Stay (HOSPITAL_COMMUNITY): Payer: Medicare HMO

## 2018-04-19 LAB — GLUCOSE, CAPILLARY
GLUCOSE-CAPILLARY: 170 mg/dL — AB (ref 70–99)
GLUCOSE-CAPILLARY: 161 mg/dL — AB (ref 70–99)
Glucose-Capillary: 134 mg/dL — ABNORMAL HIGH (ref 70–99)
Glucose-Capillary: 146 mg/dL — ABNORMAL HIGH (ref 70–99)
Glucose-Capillary: 151 mg/dL — ABNORMAL HIGH (ref 70–99)
Glucose-Capillary: 155 mg/dL — ABNORMAL HIGH (ref 70–99)

## 2018-04-19 LAB — COMPREHENSIVE METABOLIC PANEL
ALT: 18 U/L (ref 0–44)
AST: 34 U/L (ref 15–41)
Albumin: 2.4 g/dL — ABNORMAL LOW (ref 3.5–5.0)
Alkaline Phosphatase: 134 U/L — ABNORMAL HIGH (ref 38–126)
Anion gap: 6 (ref 5–15)
BUN: 17 mg/dL (ref 8–23)
CHLORIDE: 110 mmol/L (ref 98–111)
CO2: 20 mmol/L — AB (ref 22–32)
CREATININE: 1.27 mg/dL — AB (ref 0.44–1.00)
Calcium: 8.6 mg/dL — ABNORMAL LOW (ref 8.9–10.3)
GFR, EST AFRICAN AMERICAN: 45 mL/min — AB (ref >60)
GFR, EST NON AFRICAN AMERICAN: 39 mL/min — AB (ref >60)
Glucose, Bld: 173 mg/dL — ABNORMAL HIGH (ref 70–99)
POTASSIUM: 3.9 mmol/L (ref 3.5–5.1)
SODIUM: 136 mmol/L (ref 135–145)
Total Bilirubin: 0.2 mg/dL — ABNORMAL LOW (ref 0.3–1.2)
Total Protein: 6.4 g/dL — ABNORMAL LOW (ref 6.5–8.1)

## 2018-04-19 LAB — URINE CULTURE: CULTURE: NO GROWTH

## 2018-04-19 LAB — MAGNESIUM: Magnesium: 1.9 mg/dL (ref 1.7–2.4)

## 2018-04-19 LAB — PHOSPHORUS: PHOSPHORUS: 3.8 mg/dL (ref 2.5–4.6)

## 2018-04-19 MED ORDER — ENSURE ENLIVE PO LIQD
237.0000 mL | Freq: Three times a day (TID) | ORAL | Status: DC
  Administered 2018-04-19 – 2018-04-23 (×6): 237 mL via ORAL

## 2018-04-19 MED ORDER — HYDROCHLOROTHIAZIDE 12.5 MG PO CAPS: 12.5000 mg | ORAL_CAPSULE | Freq: Every day | ORAL | Status: DC

## 2018-04-19 MED ORDER — TRAVASOL 10 % IV SOLN
INTRAVENOUS | Status: AC
  Administered 2018-04-19: 17:00:00 via INTRAVENOUS
  Filled 2018-04-19: qty 462

## 2018-04-19 MED ORDER — FAMOTIDINE IN NACL 20-0.9 MG/50ML-% IV SOLN: 20.0000 mg | INTRAVENOUS | Status: DC

## 2018-04-19 MED ORDER — PANTOPRAZOLE SODIUM 40 MG IV SOLR
40.0000 mg | Freq: Once | INTRAVENOUS | Status: AC
  Administered 2018-04-19: 40 mg via INTRAVENOUS
  Filled 2018-04-19: qty 40

## 2018-04-19 MED ORDER — DEXTROSE-NACL 5-0.45 % IV SOLN: INTRAVENOUS | Status: DC

## 2018-04-19 MED ORDER — SODIUM CHLORIDE 0.9 % IV SOLN
INTRAVENOUS | Status: AC
  Administered 2018-04-19 – 2018-04-20 (×2): via INTRAVENOUS

## 2018-04-19 MED ORDER — LOSARTAN POTASSIUM 50 MG PO TABS: 100.0000 mg | ORAL_TABLET | Freq: Every day | ORAL | Status: DC

## 2018-04-19 MED ORDER — PROMETHAZINE HCL 25 MG/ML IJ SOLN
12.5000 mg | Freq: Four times a day (QID) | INTRAMUSCULAR | Status: DC | PRN
  Administered 2018-04-19: 12.5 mg via INTRAVENOUS
  Filled 2018-04-19: qty 1

## 2018-04-19 NOTE — Progress Notes (Signed)
8 Days Post-Op    UY:[id number] pain  Subjective: Up in the chair, huffing and puffing after BM, completely worn out.  Nausea and vomiting last PM, she hasn't really eaten anything today.  She is going home alone, and has some kind of insurance to help with home care, but right now no one to help.  OT recommending Home health, 3:1.Nutrition is seeing her.  Pt has seen and recommended Home health and supervision.  Objective: Vital signs in last 24 hours: Temp:  [97.9 F (36.6 C)-98.2 F (36.8 C)] 98.2 F (36.8 C) (10/17 0511) Pulse Rate:  [80-106] 106 (10/17 0511) Resp:  [16] 16 (10/17 0511) BP: (115-124)/(64-71) 124/71 (10/17 0511) SpO2:  [97 %-100 %] 99 % (10/17 0511) Last BM Date: (Patient could not recall when last bowel movement occurred) 60 PO recorded 1000 IV 2210 urine BM this AM, emesis x 1 last PM Afebrile, tachycardic 3 AM, no VS since Creatinine is up some Creatinine 0.44 - 1.00 mg/dL 1.27(H) 1.06(H) 1.02(H)     Intake/Output from previous day: 10/16 0701 - 10/17 0700 In: 1105.1 [P.O.:60; I.V.:856.7; IV Piggyback:188.4] Out: 2210 [Urine:2210] Intake/Output this shift: Total I/O In: 561.9 [I.V.:561.9] Out: -   General appearance: alert, cooperative and no distress Resp: clear to auscultation bilaterally GI: soft tender, + BS,and BM  Lab Results:  Recent Labs    04/17/18 0304 04/18/18 0343  WBC 6.8 7.7  HGB 8.8* 8.6*  HCT 27.1* 26.3*  PLT 433* 436*    BMET Recent Labs    04/18/18 0343 04/19/18 0352  NA 138 136  K 3.8 3.9  CL 110 110  CO2 22 20*  GLUCOSE 147* 173*  BUN 10 17  CREATININE 1.06* 1.27*  CALCIUM 8.3* 8.6*   PT/INR No results for input(s): LABPROT, INR in the last 72 hours.  Recent Labs  Lab 04/17/18 0304 04/19/18 0352  AST 28 34  ALT 15 18  ALKPHOS 90 134*  BILITOT 0.4 0.2*  PROT 5.3* 6.4*  ALBUMIN 2.1* 2.4*     Lipase     Component Value Date/Time   LIPASE 29 04/05/2018 1301     Medications: . acetaminophen   1,000 mg Oral Q8H  . docusate sodium  100 mg Oral BID  . enoxaparin (LOVENOX) injection  40 mg Subcutaneous Q24H  . feeding supplement (ENSURE ENLIVE)  237 mL Oral BID BM  . gabapentin  100 mg Oral TID  . [START ON 04/20/2018] losartan  100 mg Oral Daily   And  . [START ON 04/20/2018] hydrochlorothiazide  12.5 mg Oral Daily  . insulin aspart  0-9 Units Subcutaneous Q4H  . levothyroxine  50 mcg Oral QAC breakfast  . methocarbamol  750 mg Oral TID  . metoprolol tartrate  12.5 mg Oral BID  . oxybutynin  5 mg Oral BID  . sodium chloride flush  10-40 mL Intracatheter Q12H   . TPN ADULT (ION) 35 mL/hr at 04/19/18 1000   Assessment/Plan Hypertension Chronic kidney disease stage III/acute kidney injury Anemia CAD with history of stents Chronic diastolic congestive heart failure COPD with history of tobacco use Hxof CVA/PVD Hypothyroid Hx gout BMI 39.8 Moderate malnutrition  Prealbumin 15.4 (04/17/18)  Localized right colonic perforation with mass - S/PDiagnostic laparoscopy,LOA, Rcolectomy, placement of drain,04/11/2018 Dr. 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 Dysuria - will start pyridium,  check UA and culture, continue BS abx for now  FEN: soft diet, =>> vomited last PM, not eating this AM;   TPN @ 35 ml/hr ID: Flagyl 10/03-10/17;Maxipime 10/3-10/16 DVT: Lovenox /SCD Foley:wick Follow up: Dr. Donne Hazel  Plan:  Home health, full liquids, Case management.  She wants to go home, but if she can't she may need to consider rehab and she is willing to discuss this.       LOS: 14 days    Joan Elliott 04/19/2018 [id number]

## 2018-04-19 NOTE — Progress Notes (Signed)
Nutrition Follow-up  DOCUMENTATION CODES:   Obesity unspecified  INTERVENTION:   -TPN management per pharmacy -Increase Ensure Enlive po to TID, each supplement provides 350 kcal and 20 grams of protein -RD will follow for diet advancement and adjust supplement regimen as appropriate  NUTRITION DIAGNOSIS:   Inadequate oral intake related to altered GI function, decreased appetite as evidenced by meal completion < 50%, per patient/family report.  Ongoing  GOAL:   Patient will meet greater than or equal to 90% of their needs  Progressing  MONITOR:   PO intake, Supplement acceptance, Diet advancement, Labs, Weight trends, Skin, I & O's  REASON FOR ASSESSMENT:   Consult New TPN/TNA  ASSESSMENT:   Joan Elliott is a 77 y.o. female with medical history significant of CKD stage 3, HTN, stroke, CAD. Patient presents to the ED with ~7 day history of worsening RLQ abd pain.  Associated anorexia and poor PO intake.  Passing gas but no BM in past couple of days.  Symptoms worsening, nothing makes it better nor worse.  Had nausea without vomiting.  Had chills but no fevers.  Urine color change over past week.  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   Pt resting quietly at time of visit; RD did not disturb. Per MD notes, pt with vomiting this AM. Observed breakfast tray, which was untouched. Pt was accepting Ensure supplements yesterday (only consumes strawberry flavor).   Pt remains on TPN- 35 ml/hr at 1800, which will provide 834 kcals and 46 grams protein (meeting 51% of estimated kcal needs and 51%  of estimated protein needs). Per per pharmacy note, plan to continue with rate of 35 ml/hr this evening.   Labs reviewed: CBGS: 151-170.   Diet Order:   Diet Order            Diet full liquid Room service appropriate? Yes; Fluid consistency: Thin  Diet effective now              EDUCATION NEEDS:   No education needs have been identified at this time  Skin:  Skin Assessment: Skin Integrity Issues: Skin Integrity Issues:: Incisions, Wound VAC Wound Vac: abdomen Incisions: closed abdomen  Last BM:  04/16/18  Height:   Ht Readings from Last 1 Encounters:  04/16/18 5' (1.524 m)    Weight:   Wt Readings from Last 1 Encounters:  04/16/18 93.4 kg    Ideal Body Weight:  45.5 kg  BMI:  Body mass index is 40.21 kg/m.  Estimated Nutritional Needs:   Kcal:  1650-1850  Protein:  90-105 grams  Fluid:  > 1.7 L    Mancil Pfenning A. Jimmye Norman, RD, LDN, CDE Pager: [phone] After hours Pager: [phone]

## 2018-04-19 NOTE — Progress Notes (Signed)
Pt had nausea and vomiting this evening, and generalized not feeling well this day. Dr. Algis Liming notified, and orders received.  I notified Dr. Dema Severin, per orders from Dr. Algis Liming - Received lab order from Dr. Dema Severin, and advised to keep the pt on diet - she may not feel like eating.  Dr. Dema Severin aware of Pt's fluids- TPN and IVFs - advised to monitor patient, to evaluate Pt in the AM.  Pt explained to above and oral intake.

## 2018-04-19 NOTE — Progress Notes (Signed)
Physical Therapy Treatment Patient Details Name: Joan Elliott MRN: [id number] DOB: Oct 25, 1940 Today's Date: 04/19/2018    History of Present Illness Pt is a 77 y/o female admitted secondry to worsen in RLQ pain. Thought to have acute perforated appendicitis with abscess, however, a mass has not been ruled out. IR denied drain placement per notes.OR 04/11/18 for colectomy and VAC placed.  PMH includes CKD and HTN.      PT Comments    Patient progressing well with ambulation and able to demonstrate bed mobility with S from elevated HOB.  Currently appropriate for HHPT follow up.  Still important for ambulation multiple times a day as able.    Follow Up Recommendations  Home health PT;Supervision for mobility/OOB     Equipment Recommendations  3in1 (PT)    Recommendations for Other Services       Precautions / Restrictions Precautions Precautions: Fall    Mobility  Bed Mobility Overal bed mobility: Needs Assistance Bed Mobility: Sit to Supine     Supine to sit: Supervision     General bed mobility comments: increased time, heavy rail use, HOB elevated  Transfers Overall transfer level: Needs assistance Equipment used: Rolling walker (2 wheeled);None Transfers: Risk manager;Sit to/from Stand Sit to Stand: Supervision Stand pivot transfers: Min guard       General transfer comment: to [facility] no device; up from [facility] no physical assist, help from safety with IV and to obtain walker  Ambulation/Gait Ambulation/Gait assistance: Min guard Gait Distance (Feet): 200 Feet Assistive device: Rolling walker (2 wheeled) Gait Pattern/deviations: Step-through pattern;Decreased stride length;Trunk flexed;Shuffle     General Gait Details: increased time with two to three standing rest breaks, some shaking at times and dyspnea, but SpO2 98%; at times increased forward flexion but corrects with standing rest   Stairs             Wheelchair Mobility    Modified  Rankin (Stroke Patients Only)       Balance Overall balance assessment: Needs assistance Sitting-balance support: No upper extremity supported;Feet supported Sitting balance-Leahy Scale: Normal     Standing balance support: Bilateral upper extremity supported Standing balance-Leahy Scale: Poor Standing balance comment: requires UE support                             Cognition Arousal/Alertness: Awake/alert Behavior During Therapy: WFL for tasks assessed/performed Overall Cognitive Status: Within Functional Limits for tasks assessed                                        Exercises      General Comments General comments (skin integrity, edema, etc.): toileted on BSC assist for hygiene      Pertinent Vitals/Pain Pain Score: 7  Pain Location: lower R abdomen  Pain Descriptors / Indicators: Grimacing;Guarding Pain Intervention(s): Monitored during session;Repositioned    Home Living                      Prior Function            PT Goals (current goals can now be found in the care plan section) Progress towards PT goals: Progressing toward goals    Frequency    Min 3X/week      PT Plan Current plan remains appropriate    Co-evaluation  AM-PAC PT "6 Clicks" Daily Activity  Outcome Measure  Difficulty turning over in bed (including adjusting bedclothes, sheets and blankets)?: A Little Difficulty moving from lying on back to sitting on the side of the bed? : A Little Difficulty sitting down on and standing up from a chair with arms (e.g., wheelchair, bedside commode, etc,.)?: A Little Help needed moving to and from a bed to chair (including a wheelchair)?: A Little Help needed walking in hospital room?: A Little Help needed climbing 3-5 steps with a railing? : A Lot 6 Click Score: 17    End of Session   Activity Tolerance: Patient tolerated treatment well Patient left: with call bell/phone within  reach;in chair   PT Visit Diagnosis: Muscle weakness (generalized) (M62.81);Other abnormalities of gait and mobility (R26.89);Pain     Time: 0340-3524 PT Time Calculation (min) (ACUTE ONLY): 23 min  Charges:  $Gait Training: 8-22 mins $Therapeutic Activity: 8-22 mins                     Joan Elliott, [place] [facility] [phone] 04/19/2018    Joan Elliott 04/19/2018, 4:45 PM

## 2018-04-19 NOTE — Progress Notes (Signed)
PHARMACY - ADULT TOTAL PARENTERAL NUTRITION CONSULT NOTE   Pharmacy Consult:  TPN Indication: Prolonged ileus  Patient Measurements: Height: 5' (152.4 cm) Weight: 205 lb 14.6 oz (93.4 kg) IBW/kg (Calculated) : 45.5 TPN AdjBW (KG): 57.3 Body mass index is 40.21 kg/m.  Assessment:  50 yof presented to the hospital with worsening RLQ abdominal pain with associated anorexia and poor PO intake. Found to have R colonic perforation with mass. S/p diagnostic laparoscopy with LOA, R colectomy and placement of R retroperitoneal abscess drain. To start TPN for nutrition support.   GI:  prealbumin 15.4, drain 0L, +BM.  Pepcid in TPN. Vomited and not tolerating food Endo: Synthroid, TSH WNL.  No hx DM - CBGs controlled (one elevated last night) Insulin requirements in the past 24 hours: 8 units Lytes: K 3.9 (goal >/= 4 for ileus), Mag 1.9 (goal >/= 2 for ileus), others WNL Renal: SCr 1.27 (up slightly), BUN WNL - UOP 1 ml/kg/hr Pulm: stable on RA Cards: VSS - hctz, losartan, metoprolol Hepatobil: Alk phos elevated, TG WNL Neuro: A&Ox4 - gabapentin, APAP, Robaxin, PRN Dilaudid ID: Flagyl/Cefepime x7 days post-op. Afebrile, WBC WNL TPN Access: CVC TPN start date: 04/16/18  Nutritional Goals (per RD rec on 10/14): 1650-1850 kCal, 90-105 gm protein, >1.7 L fluid per day  Current Nutrition:  Full liquid diet Boost Breeze TID, each supplement provides 250 kcal and 9 grams of protein TPN  Plan:   Continue reduced TPN to 35 ml/hr for now (goal rate 70 ml/hr) TPN will provide 46g AA, 118g CHO and 25g ILE for a total of 837 kCal, meeting ~50% of patient's needs. F/u toleration of diet and need to increase TPN back to goal vs stop Electrolytes in TPN: increase K and Mag  Daily multivitamin, trace elements, and Pepcid 19m to TPN Continue sensitive SSI Q4H F/U AM labs  RSalome Arnt PharmD, BCPS Please see AMION for all pharmacy numbers 04/19/2018 11:50 AM

## 2018-04-19 NOTE — Progress Notes (Addendum)
Patient ID: Joan Elliott, female   DOB: 06/03/1941, 77 y.o.   MRN: [id number]  PROGRESS NOTE    Joan Elliott  TXM:[id number] DOB: 08-29-1940 DOA: 04/05/2018 PCP: [name], MD   Brief Narrative:  77 year old female with history of chronic kidney disease stage III, hypertension, stroke, CAD presented on 04/05/2018 with abdominal pain.  She was admitted with probable perforated appendicitis and probable underlying mass.  General surgery was consulted.  Patient was started on IV antibiotics.  IR was consulted but there was no abscess to be drained.  Patient continued to have abdominal pain.  She underwent diagnostic laparoscopy with lysis of adhesions and open right colectomy on 04/11/2018 by general surgery.   Assessment & Plan:   Principal Problem:   Acute perforated appendicitis Active Problems:   CKD (chronic kidney disease) stage 3, GFR 30-59 ml/min (HCC)   AKI (acute kidney injury) ([facility])   HTN (hypertension)   Localized right colonic perforation causing abscess/mass - Status post diagnostic laparoscopy with lysis of adhesions and open right colectomy on 04/11/2018 by general surgery.  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  While she was on bowel rest/poor oral intake, getting TPN which was started on 04/16/2018 as per general surgery.  Continue cefepime and Flagyl for a total of 7 days postop as per general surgery recommendations.   Postop patient had wound VAC and JP drain which were removed 10/16. On 10/16, diet was advanced to soft diet.  However patient has not been able to tolerate any significant diet with nausea and vomiting since last night.  As per general surgery follow-up, suspecting ileus but KUB negative, diet downgraded to n.p.o.  Continue TPN.  Leukocytosis -Likely related to primary problem i.e. colonic perforation with abscess.  Resolved.  Acute blood loss  anemia -Probably from GI blood loss in the setting of perforated colon/appendix -Hemoglobin stable in the mid 8 g range for several days.  Acute kidney injury on chronic kidney disease stage III-IV -No evidence of nephrolithiasis or hydronephrosis on CT of the abdomen and pelvis.   -Home losartan/hydrochlorothiazide resumed on 04/16/2018. -Acute kidney injury had resolved but creatinine up slightly to 1.2, likely related to poor oral intake and GI losses.  Held losartan/HCTZ.  Gentle IV fluids.  Follow BMP in a.m.  CAD with stent in RCA and history of CVA, history of PVD - Continue beta-blocker and statin.  Plavix held probably for surgery.  Will resume Plavix only when cleared by general surgery- general surgery to advise.  Chronic diastolic CHF - Currently compensated.  Monitor strict input and output.  Continue metoprolol.  Losartan/hydrochlorthiazide resumed on 04/16/2018.  Essential hypertension -Controlled.  Continue metoprolol and losartan/hydrochlorothiazide.  COPD with tobacco abuse -Stable without clinical bronchospasm.    Hypothyroidism On Synthroid  Gout -Stable on allopurinol  Obesity/Body mass index is 40.21 kg/m. -Outpatient follow-up  Dysuria Patient currently does not have Foley catheter.  She has a pure week.  Has been on IV cefepime for a while now.  Urine microscopy and urine culture negative.  Pyridium added by general surgery.  Monitor.    DVT prophylaxis: Lovenox Code Status: Full Family Communication: None at bedside Disposition Plan: To be determined pending clinical improvement.  Consultants:  General surgery/IR  Procedures:  diagnostic laparoscopy with lysis of adhesions and open right colectomy on 04/11/2018 by general surgery  Antimicrobials:  Cefepime and Flagyl from 04/05/2018 onwards   Subjective: Reported nausea  and nonbloody emesis since last night after she ate some chicken pot pie.  Not much abdominal pain reported.  Still having  dysuria.  No chest pain or dyspnea.  However when surgeons evaluated her, worn out after BM.   Objective: Vitals:   04/18/18 1320 04/18/18 2037 04/19/18 0511 04/19/18 1501  BP: 115/64 117/66 124/71 (!) 114/91  Pulse: 80 86 (!) 106 85  Resp: 16 16 16 16   Temp: 97.9 F (36.6 C) 98.1 F (36.7 C) 98.2 F (36.8 C) 97.7 F (36.5 C)  TempSrc: Oral Oral Oral Oral  SpO2: 100% 97% 99% 98%  Weight:      Height:        Intake/Output Summary (Last 24 hours) at 04/19/2018 1553 Last data filed at 04/19/2018 1430 Gross per 24 hour  Intake 1607.08 ml  Output 1260 ml  Net 347.08 ml   Filed Weights   04/09/18 0314 04/15/18 0619 04/16/18 1133  Weight: 92.5 kg 96.7 kg 93.4 kg    Examination:  General exam: Pleasant elderly female, moderately built and obese lying uncomfortably propped up in bed. Respiratory system: Slightly diminished breath sounds in the bases but no wheezing, rhonchi or crackles.  Rest of lung fields clear to auscultation.  No increased work of breathing.  Stable without change. Cardiovascular system: S1 and S2 heard, RRR.  No JVD, murmurs or pedal edema.  Stable. Gastrointestinal system: Abdomen is obese, midline surgical site dressing clean and dry.  Diffuse mild tenderness without rigidity, guarding or rebound.  Bowel sounds heard. CNS: Alert and oriented.  No focal neurological deficits.  Stable. Extremities: No cyanosis, edema.  Symmetric 5 x 5 power.   Data Reviewed: I have personally reviewed following labs and imaging studies  CBC: Recent Labs  Lab 04/14/18 0419 04/15/18 0401 04/16/18 0403 04/17/18 0304 04/18/18 0343  WBC 7.9 7.6 7.9 6.8 7.7  NEUTROABS 5.9 5.7 6.2 4.5 5.3  HGB 8.1* 8.6* 8.9* 8.8* 8.6*  HCT 26.1* 26.8* 27.2* 27.1* 26.3*  MCV 85.9 85.1 84.7 84.4 84.3  PLT 362 398 427* 433* 235*   Basic Metabolic Panel: Recent Labs  Lab 04/15/18 0401 04/16/18 0403 04/17/18 0304 04/18/18 0343 04/19/18 0352  NA 139 139 137 138 136  K 3.7 3.7 3.6  3.8 3.9  CL 111 111 111 110 110  CO2 22 22 22 22  20*  GLUCOSE 94 88 124* 147* 173*  BUN 5* 6* 10 10 17   CREATININE 1.12* 1.10* 1.02* 1.06* 1.27*  CALCIUM 8.0* 8.0* 8.0* 8.3* 8.6*  MG 1.8 1.7 1.7 1.7 1.9  PHOS  --   --  2.6 3.1 3.8   GFR: Estimated Creatinine Clearance: 36.7 mL/min (A) (by C-G formula based on SCr of 1.27 mg/dL (H)). Liver Function Tests: Recent Labs  Lab 04/17/18 0304 04/19/18 0352  AST 28 34  ALT 15 18  ALKPHOS 90 134*  BILITOT 0.4 0.2*  PROT 5.3* 6.4*  ALBUMIN 2.1* 2.4*    CBG: Recent Labs  Lab 04/18/18 2004 04/19/18 0020 04/19/18 0410 04/19/18 0758 04/19/18 1218  GLUCAP 139* 146* 170* 161* 151*   Lipid Profile: Recent Labs    04/17/18 0304  TRIG 97     Recent Results (from the past 240 hour(s))  Surgical pcr screen     Status: None   Collection Time: 04/11/18 11:34 AM  Result Value Ref Range Status   MRSA, PCR NEGATIVE NEGATIVE Final   Staphylococcus aureus NEGATIVE NEGATIVE Final    Comment: (NOTE) The Xpert SA Assay (FDA  approved for NASAL specimens in patients 28 years of age and older), is one component of a comprehensive surveillance program. It is not intended to diagnose infection nor to guide or monitor treatment. Performed at [facility], [place] [address], [place], [place] [address]   Culture, Urine     Status: None   Collection Time: 04/18/18  3:37 PM  Result Value Ref Range Status   Specimen Description URINE, CLEAN CATCH  Final   Special Requests cefepime, flagyl  Final   Culture   Final    NO GROWTH Performed at [facility], [address], [place], [place] [address]    Report Status 04/19/2018 FINAL  Final         Radiology Studies: Dg Abd Portable 1v  Result Date: 04/19/2018 CLINICAL DATA:  Vomiting. EXAM: PORTABLE ABDOMEN - 1 VIEW COMPARISON:  None. FINDINGS: The bowel gas pattern is normal. No radio-opaque calculi or other significant radiographic abnormality are seen. IMPRESSION: Negative.  Electronically Signed   By: Dorise Bullion III M.D   On: 04/19/2018 12:43        Scheduled Meds: . acetaminophen  1,000 mg Oral Q8H  . docusate sodium  100 mg Oral BID  . enoxaparin (LOVENOX) injection  40 mg Subcutaneous Q24H  . feeding supplement (ENSURE ENLIVE)  237 mL Oral TID BM  . gabapentin  100 mg Oral TID  . [START ON 04/20/2018] losartan  100 mg Oral Daily   And  . [START ON 04/20/2018] hydrochlorothiazide  12.5 mg Oral Daily  . insulin aspart  0-9 Units Subcutaneous Q4H  . levothyroxine  50 mcg Oral QAC breakfast  . methocarbamol  750 mg Oral TID  . metoprolol tartrate  12.5 mg Oral BID  . oxybutynin  5 mg Oral BID  . sodium chloride flush  10-40 mL Intracatheter Q12H   Continuous Infusions: . TPN ADULT (ION) 35 mL/hr at 04/19/18 1000  . TPN ADULT (ION)       LOS: 14 days      Vernell Leep, MD, FACP, [facility]. [facility] Pager [phone]  If 7PM-7AM, please contact night-coverage [email or web address] Password [email or web address] 04/19/2018, 3:53 PM

## 2018-04-20 ENCOUNTER — Inpatient Hospital Stay (HOSPITAL_COMMUNITY): Payer: Medicare HMO

## 2018-04-20 LAB — CBC WITH DIFFERENTIAL/PLATELET
Abs Immature Granulocytes: 0.08 10*3/uL — ABNORMAL HIGH (ref 0.00–0.07)
BASOS ABS: 0 10*3/uL (ref 0.0–0.1)
BASOS PCT: 0 %
Eosinophils Absolute: 0.1 10*3/uL (ref 0.0–0.5)
Eosinophils Relative: 1 %
HCT: 27.5 % — ABNORMAL LOW (ref 36.0–46.0)
HEMOGLOBIN: 9.1 g/dL — AB (ref 12.0–15.0)
Immature Granulocytes: 1 %
LYMPHS PCT: 14 %
Lymphs Abs: 1.3 10*3/uL (ref 0.7–4.0)
MCH: 28.1 pg (ref 26.0–34.0)
MCHC: 33.1 g/dL (ref 30.0–36.0)
MCV: 84.9 fL (ref 80.0–100.0)
Monocytes Absolute: 0.8 10*3/uL (ref 0.1–1.0)
Monocytes Relative: 8 %
NRBC: 0 % (ref 0.0–0.2)
Neutro Abs: 7.3 10*3/uL (ref 1.7–7.7)
Neutrophils Relative %: 76 %
PLATELETS: 485 10*3/uL — AB (ref 150–400)
RBC: 3.24 MIL/uL — AB (ref 3.87–5.11)
RDW: 15.9 % — AB (ref 11.5–15.5)
WBC: 9.6 10*3/uL (ref 4.0–10.5)

## 2018-04-20 LAB — BASIC METABOLIC PANEL
ANION GAP: 8 (ref 5–15)
BUN: 16 mg/dL (ref 8–23)
CHLORIDE: 111 mmol/L (ref 98–111)
CO2: 20 mmol/L — AB (ref 22–32)
Calcium: 8.7 mg/dL — ABNORMAL LOW (ref 8.9–10.3)
Creatinine, Ser: 1.22 mg/dL — ABNORMAL HIGH (ref 0.44–1.00)
GFR calc non Af Amer: 41 mL/min — ABNORMAL LOW (ref >60)
GFR, EST AFRICAN AMERICAN: 48 mL/min — AB (ref >60)
Glucose, Bld: 123 mg/dL — ABNORMAL HIGH (ref 70–99)
Potassium: 4.5 mmol/L (ref 3.5–5.1)
Sodium: 139 mmol/L (ref 135–145)

## 2018-04-20 LAB — GLUCOSE, CAPILLARY
GLUCOSE-CAPILLARY: 101 mg/dL — AB (ref 70–99)
GLUCOSE-CAPILLARY: 106 mg/dL — AB (ref 70–99)
GLUCOSE-CAPILLARY: 108 mg/dL — AB (ref 70–99)
GLUCOSE-CAPILLARY: 128 mg/dL — AB (ref 70–99)
Glucose-Capillary: 125 mg/dL — ABNORMAL HIGH (ref 70–99)
Glucose-Capillary: 111 mg/dL — ABNORMAL HIGH (ref 70–99)

## 2018-04-20 LAB — MAGNESIUM: MAGNESIUM: 2.2 mg/dL (ref 1.7–2.4)

## 2018-04-20 MED ORDER — PANTOPRAZOLE SODIUM 40 MG PO TBEC
40.0000 mg | DELAYED_RELEASE_TABLET | Freq: Two times a day (BID) | ORAL | Status: DC
  Administered 2018-04-20 – 2018-04-24 (×8): 40 mg via ORAL
  Filled 2018-04-20 (×8): qty 1

## 2018-04-20 MED ORDER — PROMETHAZINE HCL 25 MG/ML IJ SOLN: 6.2500 mg | Freq: Three times a day (TID) | INTRAMUSCULAR | Status: DC | PRN

## 2018-04-20 MED ORDER — TRAVASOL 10 % IV SOLN
INTRAVENOUS | Status: AC
  Administered 2018-04-20: 18:00:00 via INTRAVENOUS
  Filled 2018-04-20: qty 462

## 2018-04-20 MED ORDER — SODIUM CHLORIDE 0.9 % IV SOLN
INTRAVENOUS | Status: DC
  Administered 2018-04-20: 50 mL/h via INTRAVENOUS

## 2018-04-20 MED ORDER — ACETAMINOPHEN 160 MG/5ML PO SOLN
1000.0000 mg | Freq: Three times a day (TID) | ORAL | Status: DC
  Administered 2018-04-21 – 2018-04-23 (×8): 1000 mg via ORAL
  Filled 2018-04-20 (×8): qty 40.6

## 2018-04-20 NOTE — Progress Notes (Signed)
PHARMACY - ADULT TOTAL PARENTERAL NUTRITION CONSULT NOTE   Pharmacy Consult:  TPN Indication: Prolonged ileus  Patient Measurements: Height: 5' (152.4 cm) Weight: 205 lb 14.6 oz (93.4 kg) IBW/kg (Calculated) : 45.5 TPN AdjBW (KG): 57.3 Body mass index is 40.21 kg/m.  Assessment:  40 yof presented to the hospital with worsening RLQ abdominal pain with associated anorexia and poor PO intake. Found to have R colonic perforation with mass. S/p diagnostic laparoscopy with LOA, R colectomy 04/11/18 and placement of R retroperitoneal abscess drain. To start TPN for nutrition support.   GI:  prealbumin 15.4, Emesis 24m,  drain 0L, +stool 10/17. N/V overnight 10/18. GI prophx: Pepcid in TPN PO Intake: 1249m Ensure (x 2) last 24 hrs. Hurts to swallow,.  Endo: Synthroid, TSH WNL.  No hx DM - CBGs 106-151\. Insulin requirements in the past 24 hours: 9 units  Lytes: K 3.9>4.5 (goal >/= 4 for ileus), Bicarb slightly low,  Mag 2.2 (goal >/= 2 for ileus), Adj Ca 9.98  Renal: CKD3 SCr 1.22, UOP 1100 ml, oxybutynin  Pulm: stable on RA Cards: HTN, CHF,  CAD, PVD,VSS - hctz, losartan, metoprolol. Resume Plavix when cleared by surgery. Hepatobil: Alk phos elevated, TG WNL Neuro:h/o CVA,  gabapentin, APAP scheduled, Robaxin scheduled, ID: Flagyl/Cefepime x7 days post-op. Afebrile, WBC WNL TPN Access: CVC TPN start date: 04/16/18  Nutritional Goals (per RD rec on 10/14): 1650-1850 kCal, 90-105 gm protein, >1.7 L fluid per day  Current Nutrition:  Full liquid diet Boost Breeze TID, each supplement provides 250 kcal and 9 grams of protein TPN  Plan:   Continue reduced TPN to 35 ml/hr for now (goal rate 70 ml/hr) TPN will provide 46g AA, 118g CHO and 25g ILE for a total of 837 kCal, meeting ~50% of patient's needs. F/u toleration of diet and need to increase TPN back to goal vs stop tomorrow Electrolytes in TPN: slightly decrease K and Mg, increase acetate Daily multivitamin, trace elements,  and Pepcid 2018mo TPN Continue sensitive SSI Q4H F/U AM labs CT scan today to rule out post op complications with pain and nausea  Ilsa Bonello S. RobAlford HighlandharmD, BCPBrooksideinical Staff Pharmacist [phone]/18/2019 8:42 AM

## 2018-04-20 NOTE — Progress Notes (Signed)
Patient ID: Joan Elliott, female   DOB: 1940-11-16, 77 y.o.   MRN: [id number]    9 Days Post-Op  Subjective: Patient began having more emesis overnight.  Repeat films today are normal.  Given small dose of phenergan overnight to help with nausea.  Refused clear liquids this morning as she isn't hungry.  She seems to be telling me she is having trouble with her swallowing, that it hurts to swallow or that that is contributing some to her vomiting.  Its a bit unclear.  RN also tells me she was a bit confused this morning as well.  She complained to RN of 10/10 right sided pain this morning, but states it is gone now because she was given pain meds.  Objective: Vital signs in last 24 hours: Temp:  [97.7 F (36.5 C)-98.5 F (36.9 C)] 98.2 F (36.8 C) (10/18 0611) Pulse Rate:  [84-88] 88 (10/18 0611) Resp:  [16-18] 18 (10/18 0611) BP: (114-135)/(74-91) 132/79 (10/18 0611) SpO2:  [98 %-99 %] 98 % (10/18 0611) Last BM Date: 04/19/18  Intake/Output from previous day: 10/17 0701 - 10/18 0700 In: 1756.1 [P.O.:120; I.V.:1636.1] Out: 1325 [Urine:1100; Emesis/NG output:225] Intake/Output this shift: No intake/output data recorded.  PE: Gen: NAD Heart: regular Abd: soft, obese, +BS, ND, midline wound clean with 100% granulation tissue.  No drainage from old JP drain site.  Tender greatest on right side with deep palpation.  Psych: oriented to place and self.  States year is 2018, but got the month.  Could only get the president with multiple choice.  Could not recall on her own.  Lab Results:  Recent Labs    04/18/18 0343 04/20/18 0339  WBC 7.7 9.6  HGB 8.6* 9.1*  HCT 26.3* 27.5*  PLT 436* 485*   BMET Recent Labs    04/19/18 0352 04/20/18 0339  NA 136 139  K 3.9 4.5  CL 110 111  CO2 20* 20*  GLUCOSE 173* 123*  BUN 17 16  CREATININE 1.27* 1.22*  CALCIUM 8.6* 8.7*   PT/INR No results for input(s): LABPROT, INR in the last 72 hours. CMP     Component Value Date/Time   NA  139 04/20/2018 0339   K 4.5 04/20/2018 0339   CL 111 04/20/2018 0339   CO2 20 (L) 04/20/2018 0339   GLUCOSE 123 (H) 04/20/2018 0339   BUN 16 04/20/2018 0339   CREATININE 1.22 (H) 04/20/2018 0339   CALCIUM 8.7 (L) 04/20/2018 0339   PROT 6.4 (L) 04/19/2018 0352   ALBUMIN 2.4 (L) 04/19/2018 0352   AST 34 04/19/2018 0352   ALT 18 04/19/2018 0352   ALKPHOS 134 (H) 04/19/2018 0352   BILITOT 0.2 (L) 04/19/2018 0352   GFRNONAA 41 (L) 04/20/2018 0339   GFRAA 48 (L) 04/20/2018 0339   Lipase     Component Value Date/Time   LIPASE 29 04/05/2018 1301       Studies/Results: Dg Abd 1 View  Result Date: 04/20/2018 CLINICAL DATA:  Ileus following gastrointestinal surgery. EXAM: ABDOMEN - 1 VIEW COMPARISON:  04/19/2018 FINDINGS: Gas in large and small bowel and in the stomach. No dilated bowel loops. Gas in the rectum. Surgical bowel clips in the right abdomen. Gallbladder clips right upper quadrant. IMPRESSION: Negative. Electronically Signed   By: Franchot Gallo M.D.   On: 04/20/2018 08:50   Dg Abd Portable 1v  Result Date: 04/19/2018 CLINICAL DATA:  Vomiting. EXAM: PORTABLE ABDOMEN - 1 VIEW COMPARISON:  None. FINDINGS: The bowel gas pattern is normal.  No radio-opaque calculi or other significant radiographic abnormality are seen. IMPRESSION: Negative. Electronically Signed   By: Dorise Bullion III M.D   On: 04/19/2018 12:43    Anti-infectives: Anti-infectives (From admission, onward)   Start     Dose/Rate Route Frequency Ordered Stop   04/06/18 2000  ceFEPIme (MAXIPIME) 1 g in sodium chloride 0.9 % 100 mL IVPB     1 g 200 mL/hr over 30 Minutes Intravenous Every 24 hours 04/05/18 2005 04/18/18 2117   04/06/18 0600  metroNIDAZOLE (FLAGYL) IVPB 500 mg     500 mg 100 mL/hr over 60 Minutes Intravenous Every 8 hours 04/05/18 1957 04/19/18 1000   04/05/18 1930  ceFEPIme (MAXIPIME) 2 g in sodium chloride 0.9 % 100 mL IVPB     2 g 200 mL/hr over 30 Minutes Intravenous  Once 04/05/18 1921  04/06/18 0104   04/05/18 1930  metroNIDAZOLE (FLAGYL) IVPB 500 mg     500 mg 100 mL/hr over 60 Minutes Intravenous  Once 04/05/18 1921 04/05/18 2236       Assessment/Plan Hypertension Chronic kidney disease stage III/acute kidney injury Anemia CAD with history of stents Chronic diastolic congestive heart failure COPD with history of tobacco use Hxof CVA/PVD Hypothyroid Hx gout BMI 39.8 Moderate malnutrition  Prealbumin 15.4 (04/17/18)  Localized right colonic perforation with mass - S/PDiagnostic laparoscopy,LOA, Rcolectomy, placement of drain,04/11/2018 Dr. Donne Hazel, POD 9 - Pathologyshowed abscess of cecum diverticular disease of ascending colon, vermiform appendix, 3 morphologically benign lymph nodes - NS WD dressing changes to midline wound BID - JP out -patient with more vomiting, despite x-ray that is normal.  It is unclear if her nausea and vomiting is secondary to "swallowing" as she said it hurts to swallow right now (? Candida) vs intra-abdominal source given pain she was having this am before I arrived.  Her WBC are normal and she has not had a fever; however, given her complaints, we will order a CT scan today to rule out post op complication. Dysuria- resolved, UA negative  TRZ:NBVAPO/LID ID: Flagyl 10/03-10/17;Maxipime 10/3-10/16 DVT: Lovenox /SCD Foley:wick Follow up: Dr. Donne Hazel   LOS: 15 days    [name] , [facility] 04/20/2018, 10:05 AM Pager: [phone]

## 2018-04-20 NOTE — Progress Notes (Signed)
According to the IV team RN the Central line that the TPN is infusing through is not returning blood, however does flush with ease.

## 2018-04-20 NOTE — Progress Notes (Signed)
Patient ID: Joan Elliott, female   DOB: 09/19/1940, 77 y.o.   MRN: [id number]  PROGRESS NOTE    SHARONANN MALBROUGH  FYB:[id number] DOB: Dec 25, 1940 DOA: 04/05/2018 PCP: [name], MD   Brief Narrative:  77 year old female with history of chronic kidney disease stage III, hypertension, stroke, CAD presented on 04/05/2018 with abdominal pain.  She was admitted with probable perforated appendicitis and probable underlying mass.  General surgery was consulted.  Patient was started on IV antibiotics.  IR was consulted but there was no abscess to be drained.  Patient continued to have abdominal pain.  She underwent diagnostic laparoscopy with lysis of adhesions and open right colectomy on 04/11/2018 by general surgery.  Hospital course complicated by ongoing abdominal pain, poor oral intake, nausea and vomiting.   Assessment & Plan:   Principal Problem:   Acute perforated appendicitis Active Problems:   CKD (chronic kidney disease) stage 3, GFR 30-59 ml/min (HCC)   AKI (acute kidney injury) ([place])   HTN (hypertension)   Localized right colonic perforation causing abscess/mass - Status post diagnostic laparoscopy with lysis of adhesions and open right colectomy on 04/11/2018 by general surgery.  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  While she was on bowel rest/poor oral intake, getting TPN which was started on 04/16/2018 as per general surgery.  Continue cefepime and Flagyl for a total of 7 days postop as per general surgery recommendations.   Postop patient had wound VAC and JP drain which were removed 10/16. Since 10/16, ongoing issues with nausea vomiting, inability to tolerate oral intake, abdominal pain.  General surgery following.  KUB 10/18 Neg.  CT abdomen ordered to further evaluate.  She also reported some heartburn on swallowing, PPI added.  Leukocytosis -Likely related to primary problem i.e.  colonic perforation with abscess.  Resolved.  Acute blood loss anemia -Probably from GI blood loss in the setting of perforated colon/appendix -Hemoglobin stable in the mid 8 g range for several days.  Acute kidney injury on chronic kidney disease stage III-IV -No evidence of nephrolithiasis or hydronephrosis on CT of the abdomen and pelvis.   -Acute kidney injury had resolved but creatinine up slightly to 1.2, likely related to poor oral intake and GI losses.  Discontinue losartan/HCTZ temporarily to avoid acute kidney injury in the context of emesis and poor oral intake.  Gentle IV fluids.  Creatinine stable in the 1.2 range.  Follow BMP in a.m.Marland Kitchen  CAD with stent in RCA and history of CVA, history of PVD - Continue beta-blocker and statin.  Plavix held probably for surgery.  Will resume Plavix only when cleared by general surgery- I discussed with general surgery who advised to hold off initiating Plavix until CT reviewed.  Chronic diastolic CHF - Currently compensated.  Monitor strict input and output.  Continue metoprolol.  Losartan/HCTZ discontinued temporarily.  Essential hypertension -Controlled.  Continue metoprolol.Marland Kitchen  COPD with tobacco abuse -Stable without clinical bronchospasm.    Hypothyroidism On Synthroid  Gout -Stable on allopurinol  Obesity/Body mass index is 40.21 kg/m. -Outpatient follow-up  Dysuria Patient currently does not have Foley catheter.  She has a pure week.  Has been on IV cefepime for a while now.  Urine microscopy and urine culture negative.  Pyridium added by general surgery.  Monitor.  Confusion Mild confusion noted this morning.  No focal deficits.?  Related to medications.  Trying to minimize medications causing it including Phenergan.  DVT prophylaxis: Lovenox Code Status: Full Family Communication: None at bedside Disposition Plan: To be determined pending clinical improvement.  Consultants:  General surgery/IR  Procedures:    diagnostic laparoscopy with lysis of adhesions and open right colectomy on 04/11/2018 by general surgery  Antimicrobials:  Completed course of antibiotics.   Subjective: As per RN, slightly confused this morning.  No vomiting since last night.  However complained of heartburn and was reluctant to take her medications this morning.  Had a BM yesterday afternoon.  Reported some abdominal pain this morning but better after pain medicines.   Objective: Vitals:   04/19/18 2002 04/20/18 0611 04/20/18 1039 04/20/18 1445  BP: 135/74 132/79 (!) 109/55 (!) 114/58  Pulse: 84 88 69 82  Resp: 17 18    Temp: 98.5 F (36.9 C) 98.2 F (36.8 C)  98.1 F (36.7 C)  TempSrc: Oral Oral  Oral  SpO2: 99% 98%  97%  Weight:      Height:        Intake/Output Summary (Last 24 hours) at 04/20/2018 1717 Last data filed at 04/20/2018 1254 Gross per 24 hour  Intake 1921.78 ml  Output 1625 ml  Net 296.78 ml   Filed Weights   04/09/18 0314 04/15/18 0619 04/16/18 1133  Weight: 92.5 kg 96.7 kg 93.4 kg    Examination:  General exam: Pleasant elderly female, moderately built and obese lying comfortably propped up in bed and getting ready to go for x-ray this morning.  Did not appear in any distress.  Respiratory system: Slightly diminished breath sounds in the bases but no wheezing, rhonchi or crackles.  Rest of lung fields clear to auscultation.  No increased work of breathing.  Stable without change. Cardiovascular system: S1 and S2 heard, RRR.  No JVD, murmurs or pedal edema.  Stable Gastrointestinal system: Abdomen is obese, midline surgical site dressing clean and dry.  Mild and appropriate postop tenderness without rigidity, guarding or rebound.  Bowel sounds heard. CNS: Alert and oriented x2.  No focal neurological deficits.   Extremities: No cyanosis, edema.  Symmetric 5 x 5 power.   Data Reviewed: I have personally reviewed following labs and imaging studies  CBC: Recent Labs  Lab  04/15/18 0401 04/16/18 0403 04/17/18 0304 04/18/18 0343 04/20/18 0339  WBC 7.6 7.9 6.8 7.7 9.6  NEUTROABS 5.7 6.2 4.5 5.3 7.3  HGB 8.6* 8.9* 8.8* 8.6* 9.1*  HCT 26.8* 27.2* 27.1* 26.3* 27.5*  MCV 85.1 84.7 84.4 84.3 84.9  PLT 398 427* 433* 436* 967*   Basic Metabolic Panel: Recent Labs  Lab 04/16/18 0403 04/17/18 0304 04/18/18 0343 04/19/18 0352 04/20/18 0339  NA 139 137 138 136 139  K 3.7 3.6 3.8 3.9 4.5  CL 111 111 110 110 111  CO2 22 22 22  20* 20*  GLUCOSE 88 124* 147* 173* 123*  BUN 6* 10 10 17 16   CREATININE 1.10* 1.02* 1.06* 1.27* 1.22*  CALCIUM 8.0* 8.0* 8.3* 8.6* 8.7*  MG 1.7 1.7 1.7 1.9 2.2  PHOS  --  2.6 3.1 3.8  --    GFR: Estimated Creatinine Clearance: 38.2 mL/min (A) (by C-G formula based on SCr of 1.22 mg/dL (H)). Liver Function Tests: Recent Labs  Lab 04/17/18 0304 04/19/18 0352  AST 28 34  ALT 15 18  ALKPHOS 90 134*  BILITOT 0.4 0.2*  PROT 5.3* 6.4*  ALBUMIN 2.1* 2.4*    CBG: Recent Labs  Lab 04/20/18 0024 04/20/18 0501 04/20/18 0750 04/20/18 1250 04/20/18 1651  GLUCAP 106*  128* 125* 111* 108*   Lipid Profile: No results for input(s): CHOL, HDL, LDLCALC, TRIG, CHOLHDL, LDLDIRECT in the last 72 hours.   Recent Results (from the past 240 hour(s))  Surgical pcr screen     Status: None   Collection Time: 04/11/18 11:34 AM  Result Value Ref Range Status   MRSA, PCR NEGATIVE NEGATIVE Final   Staphylococcus aureus NEGATIVE NEGATIVE Final    Comment: (NOTE) The Xpert SA Assay (FDA approved for NASAL specimens in patients 11 years of age and older), is one component of a comprehensive surveillance program. It is not intended to diagnose infection nor to guide or monitor treatment. Performed at [facility], [place] [address], [place], [place] [address]   Culture, Urine     Status: None   Collection Time: 04/18/18  3:37 PM  Result Value Ref Range Status   Specimen Description URINE, CLEAN CATCH  Final   Special Requests  cefepime, flagyl  Final   Culture   Final    NO GROWTH Performed at [facility], [address] [place], [place] [address]    Report Status 04/19/2018 FINAL  Final         Radiology Studies: Dg Abd 1 View  Result Date: 04/20/2018 CLINICAL DATA:  Ileus following gastrointestinal surgery. EXAM: ABDOMEN - 1 VIEW COMPARISON:  04/19/2018 FINDINGS: Gas in large and small bowel and in the stomach. No dilated bowel loops. Gas in the rectum. Surgical bowel clips in the right abdomen. Gallbladder clips right upper quadrant. IMPRESSION: Negative. Electronically Signed   By: Franchot Gallo M.D.   On: 04/20/2018 08:50   Dg Abd Portable 1v  Result Date: 04/19/2018 CLINICAL DATA:  Vomiting. EXAM: PORTABLE ABDOMEN - 1 VIEW COMPARISON:  None. FINDINGS: The bowel gas pattern is normal. No radio-opaque calculi or other significant radiographic abnormality are seen. IMPRESSION: Negative. Electronically Signed   By: Dorise Bullion III M.D   On: 04/19/2018 12:43        Scheduled Meds: . acetaminophen  1,000 mg Oral Q8H  . docusate sodium  100 mg Oral BID  . enoxaparin (LOVENOX) injection  40 mg Subcutaneous Q24H  . feeding supplement (ENSURE ENLIVE)  237 mL Oral TID BM  . gabapentin  100 mg Oral TID  . insulin aspart  0-9 Units Subcutaneous Q4H  . levothyroxine  50 mcg Oral QAC breakfast  . methocarbamol  750 mg Oral TID  . metoprolol tartrate  12.5 mg Oral BID  . oxybutynin  5 mg Oral BID  . sodium chloride flush  10-40 mL Intracatheter Q12H   Continuous Infusions: . TPN ADULT (ION) 35 mL/hr at 04/20/18 1100  . TPN ADULT (ION)       LOS: 15 days      Vernell Leep, MD, FACP, [facility]. [facility] Pager [phone]  If 7PM-7AM, please contact night-coverage [email or web address] Password [email or web address] 04/20/2018, 5:17 PM

## 2018-04-21 ENCOUNTER — Inpatient Hospital Stay (HOSPITAL_COMMUNITY): Payer: Medicare HMO

## 2018-04-21 LAB — GLUCOSE, CAPILLARY
GLUCOSE-CAPILLARY: 107 mg/dL — AB (ref 70–99)
GLUCOSE-CAPILLARY: 112 mg/dL — AB (ref 70–99)
GLUCOSE-CAPILLARY: 117 mg/dL — AB (ref 70–99)
GLUCOSE-CAPILLARY: 96 mg/dL (ref 70–99)
Glucose-Capillary: 109 mg/dL — ABNORMAL HIGH (ref 70–99)
Glucose-Capillary: 120 mg/dL — ABNORMAL HIGH (ref 70–99)
Glucose-Capillary: 124 mg/dL — ABNORMAL HIGH (ref 70–99)

## 2018-04-21 LAB — MAGNESIUM: MAGNESIUM: 2.2 mg/dL (ref 1.7–2.4)

## 2018-04-21 LAB — BASIC METABOLIC PANEL
Anion gap: 7 (ref 5–15)
BUN: 16 mg/dL (ref 8–23)
CHLORIDE: 111 mmol/L (ref 98–111)
CO2: 22 mmol/L (ref 22–32)
Calcium: 8.4 mg/dL — ABNORMAL LOW (ref 8.9–10.3)
Creatinine, Ser: 1.14 mg/dL — ABNORMAL HIGH (ref 0.44–1.00)
GFR calc Af Amer: 52 mL/min — ABNORMAL LOW (ref >60)
GFR calc non Af Amer: 45 mL/min — ABNORMAL LOW (ref >60)
GLUCOSE: 131 mg/dL — AB (ref 70–99)
POTASSIUM: 4.8 mmol/L (ref 3.5–5.1)
Sodium: 140 mmol/L (ref 135–145)

## 2018-04-21 MED ORDER — HYDROMORPHONE HCL 1 MG/ML IJ SOLN
0.5000 mg | INTRAMUSCULAR | Status: DC | PRN
  Administered 2018-04-22 – 2018-04-23 (×2): 0.5 mg via INTRAVENOUS
  Filled 2018-04-21 (×2): qty 1

## 2018-04-21 MED ORDER — TRAVASOL 10 % IV SOLN
INTRAVENOUS | Status: AC
  Administered 2018-04-21: 17:00:00 via INTRAVENOUS
  Filled 2018-04-21: qty 462

## 2018-04-21 MED ORDER — CODEINE SULFATE 15 MG PO TABS
15.0000 mg | ORAL_TABLET | Freq: Four times a day (QID) | ORAL | Status: DC | PRN
  Administered 2018-04-21 – 2018-04-23 (×3): 15 mg via ORAL
  Filled 2018-04-21 (×3): qty 1

## 2018-04-21 NOTE — Progress Notes (Signed)
10 Days Post-Op    CC: Abdominal pain  Subjective: She just came back from her CT with findings below.  She denies nausea, and is having bowel movements.  She is on clear liquids.  She says she is up walking but I think she still can be a disposition problem also says she lives alone.  Midline incision looks good.  Drain site and old port sites are fine she still has some staples in place.  Objective: Vital signs in last 24 hours: Temp:  [98.1 F (36.7 C)] 98.1 F (36.7 C) (10/19 0538) Pulse Rate:  [60-82] 64 (10/19 1013) Resp:  [16-17] 17 (10/19 0538) BP: (98-122)/(58-72) 122/72 (10/19 1013) SpO2:  [97 %-100 %] 100 % (10/19 0538) Last BM Date: 04/20/18 540 PO listed 1263 IV listed 1100 urine Stool x 2 Afebrile VSS  Creatinine still slightly elevated 1.14. Other electrolytes are normal. CT scan completed at 9 AM today: Postop right hemicolectomy changes.  Nonspecific small bowel distention with contrast seen distally about the small bowel to the anastomosis no obstruction, mild ileus, stranding postop edema and inflammation right abdomen without discrete fluid, abscess, or hematoma.  Is also noted to have a small hiatal hernia.    Intake/Output from previous day: 10/18 0701 - 10/19 0700 In: 1803.5 [P.O.:540; I.V.:1263.5] Out: 1100 [Urine:1100] Intake/Output this shift: Total I/O In: 230 [P.O.:220; I.V.:10] Out: -   General appearance: alert, cooperative and no distress Resp: clear to auscultation bilaterally and Breath sounds down somewhat in the bases. GI: Soft, sore, midline incision looks fine.  I change the dressing.  Other sites look fine she still has some staples.  Lab Results:  Recent Labs    04/20/18 0339  WBC 9.6  HGB 9.1*  HCT 27.5*  PLT 485*    BMET Recent Labs    04/20/18 0339 04/21/18 0346  NA 139 140  K 4.5 4.8  CL 111 111  CO2 20* 22  GLUCOSE 123* 131*  BUN 16 16  CREATININE 1.22* 1.14*  CALCIUM 8.7* 8.4*   PT/INR No results for  input(s): LABPROT, INR in the last 72 hours.  Recent Labs  Lab 04/17/18 0304 04/19/18 0352  AST 28 34  ALT 15 18  ALKPHOS 90 134*  BILITOT 0.4 0.2*  PROT 5.3* 6.4*  ALBUMIN 2.1* 2.4*     Lipase     Component Value Date/Time   LIPASE 29 04/05/2018 1301     Medications: . acetaminophen (TYLENOL) oral liquid 160 mg/5 mL  1,000 mg Oral Q8H  . docusate sodium  100 mg Oral BID  . enoxaparin (LOVENOX) injection  40 mg Subcutaneous Q24H  . feeding supplement (ENSURE ENLIVE)  237 mL Oral TID BM  . gabapentin  100 mg Oral TID  . insulin aspart  0-9 Units Subcutaneous Q4H  . levothyroxine  50 mcg Oral QAC breakfast  . metoprolol tartrate  12.5 mg Oral BID  . oxybutynin  5 mg Oral BID  . pantoprazole  40 mg Oral BID AC  . sodium chloride flush  10-40 mL Intracatheter Q12H   . TPN ADULT (ION) 35 mL/hr at 04/20/18 1800    Anti-infectives (From admission, onward)   Start     Dose/Rate Route Frequency Ordered Stop   04/06/18 2000  ceFEPIme (MAXIPIME) 1 g in sodium chloride 0.9 % 100 mL IVPB     1 g 200 mL/hr over 30 Minutes Intravenous Every 24 hours 04/05/18 2005 04/18/18 2117   04/06/18 0600  metroNIDAZOLE (FLAGYL) IVPB  500 mg     500 mg 100 mL/hr over 60 Minutes Intravenous Every 8 hours 04/05/18 1957 04/19/18 1000   04/05/18 1930  ceFEPIme (MAXIPIME) 2 g in sodium chloride 0.9 % 100 mL IVPB     2 g 200 mL/hr over 30 Minutes Intravenous  Once 04/05/18 1921 04/06/18 0104   04/05/18 1930  metroNIDAZOLE (FLAGYL) IVPB 500 mg     500 mg 100 mL/hr over 60 Minutes Intravenous  Once 04/05/18 1921 04/05/18 2236      Assessment/Plan  Hypertension Chronic kidney disease stage III/acute kidney injury Anemia CAD with history of stents Chronic diastolic congestive heart failure COPD with history of tobacco use Hxof CVA/PVD Hypothyroid Hx gout BMI 39.8 Moderate malnutrition Prealbumin 15.4 (04/17/18)  Localized right colonic perforation with mass - S/PDiagnostic  laparoscopy,LOA, Rcolectomy, placement of drain,04/11/2018 Dr. Donne Hazel, POD 10 - Pathologyshowed abscess of cecum diverticular disease of ascending colon, vermiform appendix, 3 morphologically benign lymph nodes - NS WD dressing changes to midline wound BID - JP out -patient with more vomiting, despite x-ray that is normal.  It is unclear if her nausea and vomiting is secondary to "swallowing" as she said it hurts to swallow right now (? Candida) vs intra-abdominal source given pain she was having this am before I arrived.  Her WBC are normal and she has not had a fever; however, given her complaints, we will order a CT scan today to rule out post op complication. Dysuria- resolved, UA negative  GGY:IRSWNI/OEV ID: Flagyl 10/03-10/17;Maxipime 10/3-10/16 DVT: Lovenox /SCD Foley:wick Follow up: Dr. Donne Hazel  Plan: I am going to advance her to full liquids, continue TNA  for now.  We need to work on mobilizing her more.  She last saw PT on 10/17.  LOS: 16 days    Kayson Bullis 04/21/2018 [phone]

## 2018-04-21 NOTE — Progress Notes (Signed)
PHARMACY - ADULT TOTAL PARENTERAL NUTRITION CONSULT NOTE   Pharmacy Consult:  TPN Indication: Prolonged ileus  Patient Measurements: Height: 5' (152.4 cm) Weight: 205 lb 14.6 oz (93.4 kg) IBW/kg (Calculated) : 45.5 TPN AdjBW (KG): 57.3 Body mass index is 40.21 kg/m.  Assessment:  39 yof presented to the hospital with worsening RLQ abdominal pain with associated anorexia and poor PO intake. Found to have R colonic perforation with mass. S/p diagnostic laparoscopy with LOA, R colectomy 04/11/18 and placement of R retroperitoneal abscess drain. To start TPN for nutrition support.   GI:  prealbumin 15.4, Emesis 0,  drain 0L, +stool 10/18. 10/18 CT: mild ileus. KUB 10/18 Neg. GI prophx: Pepcid in TPN PO Intake: 540, Ensure 0 last 24 hrs. Hurts to swallow,.  Endo: Synthroid, TSH WNL.  No hx DM - CBGs 96-125 Insulin requirements in the past 24 hours: 1 units  Lytes: K 3.9>4.5>4.8 (goal >/= 4 for ileus),  Mag 2.2 (goal >/= 2 for ileus), Adj Ca 9.68  Renal: CKD3 SCr 1.14, UOP good, oxybutynin  Pulm: stable on RA Cards: HTN, CHF,  CAD, PVD,VSS - metoprolol. Resume Plavix when cleared by surgery. (Hold losartan/HCTZ) Hepatobil: Alk phos elevated, TG WNL Neuro:h/o CVA,  gabapentin, APAP scheduled, ID: Colonic perf with abscess s/p Flagyl/Cefepime x7 days post-op. Afebrile, WBC WNL TPN Access: CVC TPN start date: 04/16/18  Nutritional Goals (per RD rec on 10/14): 1650-1850 kCal, 90-105 gm protein, >1.7 L fluid per day  Current Nutrition:  Full liquid diet Boost Breeze TID, each supplement provides 250 kcal and 9 grams of protein TPN  Plan:   Continue reduced TPN to 35 ml/hr for now (goal rate 70 ml/hr) TPN will provide 46g AA, 118g CHO and 25g ILE for a total of 837 kCal, meeting ~50% of patient's needs. Continue TPN until tolerating SOFT diet (advanced to full liquid today) Electrolytes in TPN: slightly decrease K  Daily multivitamin, trace elements, and Pepcid 20m to  TPN Continue sensitive SSI Q4H F/U AM labs   Ayline Dingus S. RAlford Highland PharmD, BPragueClinical Staff Pharmacist [phone]/19/2019 11:54 AM

## 2018-04-21 NOTE — Progress Notes (Signed)
Patient ID: Joan Elliott, female   DOB: [birth year], 77 y.o.   MRN: [id number]  PROGRESS NOTE    Joan Elliott  [id number]:[id number] DOB: [birth year] DOA: 04/05/2018 PCP: [name], MD   Brief Narrative:  77 year old female with history of chronic kidney disease stage III, hypertension, stroke, CAD presented on 04/05/2018 with abdominal pain.  She was admitted with probable perforated appendicitis and probable underlying mass.  General surgery was consulted.  Patient was started on IV antibiotics.  IR was consulted but there was no abscess to be drained.  Patient continued to have abdominal pain.  She underwent diagnostic laparoscopy with lysis of adhesions and open right colectomy on 04/11/2018 by general surgery.  Hospital course complicated by ongoing abdominal pain, poor oral intake, nausea and vomiting.   Assessment & Plan:   Principal Problem:   Acute perforated appendicitis Active Problems:   CKD (chronic kidney disease) stage 3, GFR 30-59 ml/min (HCC)   AKI (acute kidney injury) ([place])   HTN (hypertension)   Localized right colonic perforation causing abscess/mass - Status post diagnostic laparoscopy with lysis of adhesions and open right colectomy on 04/11/2018 by general surgery.   - Pathology showed abscess of cecum diverticular disease of ascending colon, vermiform appendix, 3 morphologically benign lymph nodes .  While she was on bowel rest/poor oral intake, getting TPN which was started on 04/16/2018 as per general surgery.   - She completed a course of antibiotics and no clinical concerns for new infection. - Postop patient had wound VAC and JP drain which were removed 10/16. - Since 10/16, ongoing issues with nausea vomiting, inability to tolerate oral intake, abdominal pain.  - General surgery following.  KUB 10/18 Neg.  CT abdomen ordered to further evaluate.  She also reported some heartburn on swallowing, PPI added. -Overall better today.  No further nausea, vomiting,  heartburn.  Still has some abdominal pain.  Had BM 10/18.  CT abdomen 10/19 suggestive of mild small bowel ileus.  No discrete fluid collection, abscess or hematoma.  General surgery advancing to full liquids, continue TPN, mobilize, minimize opioids and continue PPI.  Leukocytosis -Likely related to primary problem i.e. colonic perforation with abscess.  Resolved.  Acute blood loss anemia -Probably from GI blood loss in the setting of perforated colon/appendix -Hemoglobin stable in the mid 8 g range for several days.  Acute kidney injury on chronic kidney disease stage III-IV -No evidence of nephrolithiasis or hydronephrosis on CT of the abdomen and pelvis.   -Acute kidney injury had resolved but creatinine up slightly to 1.2, likely related to poor oral intake and GI losses.  Discontinued losartan/HCTZ temporarily to avoid acute kidney injury in the context of emesis and poor oral intake.  Gentle IV fluids.  Acute kidney injury resolved.  CAD with stent in RCA and history of CVA, history of PVD - Continue beta-blocker and statin.  Plavix held probably for surgery.  Will resume Plavix only when cleared by general surgery- await surgery decision regarding resuming Plavix.  Chronic diastolic CHF - Currently compensated.  Monitor strict input and output.  Continue metoprolol.  Losartan/HCTZ discontinued temporarily.  Essential hypertension -Controlled.  Continue metoprolol.Marland Kitchen  COPD with tobacco abuse -Stable without clinical bronchospasm.    Hypothyroidism On Synthroid  Gout -Stable on allopurinol  Obesity/Body mass index is 40.21 kg/m. -Outpatient follow-up  Dysuria Patient currently does not have Foley catheter.  She has a pure week.  Has been on IV cefepime for a while now.  Urine microscopy  and urine culture negative.  Pyridium added by general surgery.  Tends to have resolved.  Confusion No focal deficits.?  Related to medications.  Trying to minimize medications causing it  including Phenergan.  Seems to have resolved.    DVT prophylaxis: Lovenox Code Status: Full Family Communication: None at bedside Disposition Plan: To be determined pending clinical improvement.  Consultants:  General surgery/IR  Procedures:  diagnostic laparoscopy with lysis of adhesions and open right colectomy on 04/11/2018 by general surgery  Antimicrobials:  Completed course of antibiotics.   Subjective: Joan Elliott interviewed and examined this morning along with RN in room.  Ongoing abdominal pain.  However no nausea or vomiting.  Had BM yesterday.  Not confused.   Objective: Vitals:   04/20/18 2031 04/21/18 0538 04/21/18 1013 04/21/18 1431  BP: 113/63 (!) 98/59 122/72 116/64  Pulse: 76 60 64 77  Resp: 16 17  17   Temp: 98.1 F (36.7 C) 98.1 F (36.7 C)  98.8 F (37.1 C)  TempSrc: Oral Oral  Oral  SpO2: 100% 100%  100%  Weight:      Height:        Intake/Output Summary (Last 24 hours) at 04/21/2018 1545 Last data filed at 04/21/2018 1043 Gross per 24 hour  Intake 945.9 ml  Output 600 ml  Net 345.9 ml   Filed Weights   04/09/18 0314 04/15/18 0619 04/16/18 1133  Weight: 92.5 kg 96.7 kg 93.4 kg    Examination:  General exam: Pleasant elderly female, moderately built and obese lying comfortably propped up in bed.  Looks much better than she has in the last 3 days. Respiratory system: Clear to auscultation.  No increased work of breathing. Cardiovascular system: S1 and S2 heard, RRR.  No JVD, murmurs or pedal edema.  Stable Gastrointestinal system: Surgical sites intact without acute findings.  Mild tenderness.  No distention.  Normal bowel sounds heard. CNS: Alert and oriented x 3.  No focal neurological deficits.   Extremities: No cyanosis, edema.  Symmetric 5 x 5 power.   Data Reviewed: I have personally reviewed following labs and imaging studies  CBC: Recent Labs  Lab 04/15/18 0401 04/16/18 0403 04/17/18 0304 04/18/18 0343 04/20/18 0339  WBC 7.6  7.9 6.8 7.7 9.6  NEUTROABS 5.7 6.2 4.5 5.3 7.3  HGB 8.6* 8.9* 8.8* 8.6* 9.1*  HCT 26.8* 27.2* 27.1* 26.3* 27.5*  MCV 85.1 84.7 84.4 84.3 84.9  PLT 398 427* 433* 436* 202*   Basic Metabolic Panel: Recent Labs  Lab 04/17/18 0304 04/18/18 0343 04/19/18 0352 04/20/18 0339 04/21/18 0346  NA 137 138 136 139 140  K 3.6 3.8 3.9 4.5 4.8  CL 111 110 110 111 111  CO2 22 22 20* 20* 22  GLUCOSE 124* 147* 173* 123* 131*  BUN 10 10 17 16 16   CREATININE 1.02* 1.06* 1.27* 1.22* 1.14*  CALCIUM 8.0* 8.3* 8.6* 8.7* 8.4*  MG 1.7 1.7 1.9 2.2 2.2  PHOS 2.6 3.1 3.8  --   --    GFR: Estimated Creatinine Clearance: 40.9 mL/min (A) (by C-G formula based on SCr of 1.14 mg/dL (H)). Liver Function Tests: Recent Labs  Lab 04/17/18 0304 04/19/18 0352  AST 28 34  ALT 15 18  ALKPHOS 90 134*  BILITOT 0.4 0.2*  PROT 5.3* 6.4*  ALBUMIN 2.1* 2.4*    CBG: Recent Labs  Lab 04/20/18 1958 04/20/18 2353 04/21/18 0334 04/21/18 0807 04/21/18 1218  GLUCAP 101* 96 124* 109* 112*   Lipid Profile: No results for input(s):  CHOL, HDL, LDLCALC, TRIG, CHOLHDL, LDLDIRECT in the last 72 hours.   Recent Results (from the past 240 hour(s))  Culture, Urine     Status: None   Collection Time: 04/18/18  3:37 PM  Result Value Ref Range Status   Specimen Description URINE, CLEAN CATCH  Final   Special Requests cefepime, flagyl  Final   Culture   Final    NO GROWTH Performed at [facility], [address], [place], [place] [address]    Report Status 04/19/2018 FINAL  Final         Radiology Studies: Ct Abdomen Pelvis Wo Contrast  Result Date: 04/21/2018 CLINICAL DATA:  Status post right colectomy 9 days ago for perforated cecal diverticulitis with nausea and abdominal pain. Fever. Suspect abscess. EXAM: CT ABDOMEN AND PELVIS WITHOUT CONTRAST TECHNIQUE: Multidetector CT imaging of the abdomen and pelvis was performed following the standard protocol without IV contrast. COMPARISON:  04/10/2018  FINDINGS: Lower chest: Normal heart size. Native coronary atherosclerosis. Lower chest calcified mediastinal lymph nodes from remote granulomatous disease. Small hiatal hernia noted. Trace basilar atelectasis dependently. No lower lobe pneumonia, significant collapse or consolidation. Lower thoracic degenerative changes noted with osteophytes on the right. Thoracic aortic atherosclerosis present. Hepatobiliary: Limited without IV contrast. No gross focal hepatic abnormality or biliary obstruction. Remote cholecystectomy. Stable biliary prominence. Pancreas: Unremarkable. No pancreatic ductal dilatation or surrounding inflammatory changes. Spleen: Normal in size without focal abnormality. Adrenals/Urinary Tract: Stable adrenal gland thickening bilaterally, suspect hyperplasia. No renal obstruction, hydronephrosis, hydroureter, or obstructing ureteral calculus. Ureters are symmetric and decompressed. Bladder unremarkable. Stomach/Bowel: Interval right colectomy. Anastomosis in the right abdomen appears patent. Mild nonspecific small bowel dilatation proximally without obstruction. Contrast noted throughout the small bowel extending to the anastomosis. Strandy edema/inflammation in the right abdomen related to the recent surgery. No significant fluid collection, hemorrhage, hematoma, abscess, or free air. Trace dependent pelvic ascites, image 71 series 3. Vascular/Lymphatic: Aortic atherosclerosis noted. No aneurysm. No retroperitoneal hematoma. No bulky adenopathy. Reproductive: Status post hysterectomy. No adnexal masses. Other: Postop changes of the abdominal wall.  No significant hernia. Musculoskeletal: Body wall and flank subcutaneous edema compatible with anasarca extending over the hip regions. No abdominal wall fluid collection, abscess or hematoma. Degenerative changes of the lumbar spine. Facet arthropathy noted posteriorly. No acute osseous finding. Gluteal injection granulomas present. IMPRESSION: Postop  changes from right hemicolectomy. Nonspecific proximal small bowel distension with contrast seen distally throughout the small bowel to the anastomosis. Negative for significant obstruction. Query mild residual ileus. Strandy postop edema and inflammation in the right abdomen without discrete fluid collection, abscess or hematoma. Trace dependent pelvic ascites, likely reactive Abdominal atherosclerosis Remote cholecystectomy and hysterectomy Electronically Signed   By: Jerilynn Mages.  Shick M.D.   On: 04/21/2018 11:05   Dg Abd 1 View  Result Date: 04/20/2018 CLINICAL DATA:  Ileus following gastrointestinal surgery. EXAM: ABDOMEN - 1 VIEW COMPARISON:  04/19/2018 FINDINGS: Gas in large and small bowel and in the stomach. No dilated bowel loops. Gas in the rectum. Surgical bowel clips in the right abdomen. Gallbladder clips right upper quadrant. IMPRESSION: Negative. Electronically Signed   By: Franchot Gallo M.D.   On: 04/20/2018 08:50        Scheduled Meds: . acetaminophen (TYLENOL) oral liquid 160 mg/5 mL  1,000 mg Oral Q8H  . docusate sodium  100 mg Oral BID  . enoxaparin (LOVENOX) injection  40 mg Subcutaneous Q24H  . feeding supplement (ENSURE ENLIVE)  237 mL Oral TID BM  .  gabapentin  100 mg Oral TID  . insulin aspart  0-9 Units Subcutaneous Q4H  . levothyroxine  50 mcg Oral QAC breakfast  . metoprolol tartrate  12.5 mg Oral BID  . oxybutynin  5 mg Oral BID  . pantoprazole  40 mg Oral BID AC  . sodium chloride flush  10-40 mL Intracatheter Q12H   Continuous Infusions: . TPN ADULT (ION) 35 mL/hr at 04/20/18 1800  . TPN ADULT (ION)       LOS: 16 days      Vernell Leep, MD, FACP, [facility]. [facility] Pager [phone]  If 7PM-7AM, please contact night-coverage [email or web address] Password [email or web address] 04/21/2018, 3:45 PM

## 2018-04-21 NOTE — Plan of Care (Signed)
  Problem: Clinical Measurements: Goal: Will remain free from infection Outcome: Progressing   Problem: Activity: Goal: Risk for activity intolerance will decrease Outcome: Progressing   Problem: Pain Managment: Goal: General experience of comfort will improve Outcome: Progressing   

## 2018-04-22 LAB — GLUCOSE, CAPILLARY
GLUCOSE-CAPILLARY: 135 mg/dL — AB (ref 70–99)
Glucose-Capillary: 102 mg/dL — ABNORMAL HIGH (ref 70–99)
Glucose-Capillary: 119 mg/dL — ABNORMAL HIGH (ref 70–99)
Glucose-Capillary: 111 mg/dL — ABNORMAL HIGH (ref 70–99)

## 2018-04-22 MED ORDER — TRAVASOL 10 % IV SOLN
INTRAVENOUS | Status: DC
  Administered 2018-04-22: 17:00:00 via INTRAVENOUS
  Filled 2018-04-22: qty 462

## 2018-04-22 MED ORDER — CLOPIDOGREL BISULFATE 75 MG PO TABS
75.0000 mg | ORAL_TABLET | Freq: Every day | ORAL | Status: DC
  Administered 2018-04-22 – 2018-04-24 (×3): 75 mg via ORAL
  Filled 2018-04-22 (×3): qty 1

## 2018-04-22 MED ORDER — INSULIN ASPART 100 UNIT/ML ~~LOC~~ SOLN
0.0000 [IU] | Freq: Four times a day (QID) | SUBCUTANEOUS | Status: DC
  Administered 2018-04-23: 1 [IU] via SUBCUTANEOUS

## 2018-04-22 NOTE — Progress Notes (Signed)
Patient ID: MAKHAYLA MCMURRY, female   DOB: Nov 28, 1940, 77 y.o.   MRN: [id number]  PROGRESS NOTE    PALMYRA ROGACKI  UXL:[id number] DOB: [birth year] DOA: 04/05/2018 PCP: [name], MD   Brief Narrative:  77 year old female with history of chronic kidney disease stage III, hypertension, stroke, CAD presented on 04/05/2018 with abdominal pain.  She was admitted with probable perforated appendicitis and probable underlying mass.  General surgery was consulted.  Patient was started on IV antibiotics.  IR was consulted but there was no abscess to be drained.  Patient continued to have abdominal pain.  She underwent diagnostic laparoscopy with lysis of adhesions and open right colectomy on 04/11/2018 by general surgery.  Hospital course complicated by ongoing abdominal pain, poor oral intake, nausea and vomiting/ileus.  Improving.  Advancing diet..   Assessment & Plan:   Principal Problem:   Acute perforated appendicitis Active Problems:   CKD (chronic kidney disease) stage 3, GFR 30-59 ml/min (HCC)   AKI (acute kidney injury) ([place])   HTN (hypertension)   Localized right colonic perforation causing abscess/mass - Status post diagnostic laparoscopy with lysis of adhesions and open right colectomy on 04/11/2018 by general surgery.   - Pathology showed abscess of cecum diverticular disease of ascending colon, vermiform appendix, 3 morphologically benign lymph nodes .  While she was on bowel rest/poor oral intake, getting TPN which was started on 04/16/2018 as per general surgery.   - She completed a course of antibiotics and no clinical concerns for new infection. - Postop patient had wound VAC and JP drain which were removed 10/16. - Since 10/16, ongoing issues with nausea vomiting, inability to tolerate oral intake, abdominal pain.  - General surgery following.  KUB 10/18 Neg.   -  CT abdomen 10/19 suggestive of mild small bowel ileus.  No discrete fluid collection, abscess or hematoma.  General  surgery advancing to full liquids, continue TPN, mobilize, minimize opioids and continue PPI. -Ileus improving.  Advancing diet.  Leukocytosis -Likely related to primary problem i.e. colonic perforation with abscess.  Resolved.  Acute blood loss anemia -Probably from GI blood loss in the setting of perforated colon/appendix -Hemoglobin stable in the mid 8 g range for several days.  Acute kidney injury on chronic kidney disease stage III-IV -No evidence of nephrolithiasis or hydronephrosis on CT of the abdomen and pelvis.   -Acute kidney injury had resolved but creatinine up slightly to 1.2, likely related to poor oral intake and GI losses.  Discontinued losartan/HCTZ temporarily to avoid acute kidney injury in the context of emesis and poor oral intake.  Gentle IV fluids.  Acute kidney injury resolved.  CAD with stent in RCA and history of CVA, history of PVD - Continue beta-blocker and statin.  Plavix held probably for surgery. Since patient improving, diet being advanced, resumed Plavix.  Chronic diastolic CHF - Currently compensated.  Monitor strict input and output.  Continue metoprolol.  Losartan/HCTZ discontinued temporarily.  Essential hypertension -Controlled.  Continue metoprolol.Marland Kitchen  COPD with tobacco abuse -Stable without clinical bronchospasm.    Hypothyroidism On Synthroid  Gout -Stable on allopurinol  Obesity/Body mass index is 40.21 kg/m. -Outpatient follow-up  Dysuria Patient currently does not have Foley catheter.  She has a pure week.  Has been on IV cefepime for a while now.  Urine microscopy and urine culture negative.  Pyridium added by general surgery.  Resolved.  Confusion No focal deficits.?  Related to medications.  Trying to minimize medications causing it including Phenergan.  Resolved.    DVT prophylaxis: Lovenox Code Status: Full Family Communication: I discussed in detail with patient's son (also hospitalized at [facility]), updated care and answered  questions. Disposition Plan: Hopefully can progress in the next 2 to 3 days and then potentially DC home with home health therapies or as per updated therapy recommendations.  Consultants:  General surgery/IR  Procedures:  diagnostic laparoscopy with lysis of adhesions and open right colectomy on 04/11/2018 by general surgery  Antimicrobials:  Completed course of antibiotics.   Subjective: Overall feels much better.  Abdominal pain improved and did not complain this morning.  Had multiple BMs overnight.  Did not eat much but wants to.  No dysuria.  No heartburn.   Objective: Vitals:   04/21/18 1431 04/21/18 2138 04/22/18 0356 04/22/18 1419  BP: 116/64 111/85 134/80 (!) 110/58  Pulse: 77 68 87 78  Resp: 17 16    Temp: 98.8 F (37.1 C) 98.5 F (36.9 C) 98.3 F (36.8 C) 98.2 F (36.8 C)  TempSrc: Oral Oral Oral Oral  SpO2: 100% 100% 100% 98%  Weight:      Height:        Intake/Output Summary (Last 24 hours) at 04/22/2018 1419 Last data filed at 04/22/2018 1007 Gross per 24 hour  Intake 118 ml  Output 1500 ml  Net -1382 ml   Filed Weights   04/09/18 0314 04/15/18 0619 04/16/18 1133  Weight: 92.5 kg 96.7 kg 93.4 kg    Examination:  General exam: Pleasant elderly female, moderately built and obese lying comfortably propped up in bed.  Patient in good spirits.  Does not appear in any distress. Respiratory system: Clear to auscultation.  No increased work of breathing.  Stable Cardiovascular system: S1 and S2 heard, RRR.  No JVD, murmurs or pedal edema.  Stable Gastrointestinal system: Surgical sites intact without acute findings.  Mild tenderness at surgical sites.  No distention.  Normal bowel sounds heard. CNS: Alert and oriented x 3.  No focal neurological deficits.   Extremities: No cyanosis, edema.  Symmetric 5 x 5 power.   Data Reviewed: I have personally reviewed following labs and imaging studies  CBC: Recent Labs  Lab 04/16/18 0403 04/17/18 0304  04/18/18 0343 04/20/18 0339  WBC 7.9 6.8 7.7 9.6  NEUTROABS 6.2 4.5 5.3 7.3  HGB 8.9* 8.8* 8.6* 9.1*  HCT 27.2* 27.1* 26.3* 27.5*  MCV 84.7 84.4 84.3 84.9  PLT 427* 433* 436* 937*   Basic Metabolic Panel: Recent Labs  Lab 04/17/18 0304 04/18/18 0343 04/19/18 0352 04/20/18 0339 04/21/18 0346  NA 137 138 136 139 140  K 3.6 3.8 3.9 4.5 4.8  CL 111 110 110 111 111  CO2 22 22 20* 20* 22  GLUCOSE 124* 147* 173* 123* 131*  BUN 10 10 17 16 16   CREATININE 1.02* 1.06* 1.27* 1.22* 1.14*  CALCIUM 8.0* 8.3* 8.6* 8.7* 8.4*  MG 1.7 1.7 1.9 2.2 2.2  PHOS 2.6 3.1 3.8  --   --    GFR: Estimated Creatinine Clearance: 40.9 mL/min (A) (by C-G formula based on SCr of 1.14 mg/dL (H)). Liver Function Tests: Recent Labs  Lab 04/17/18 0304 04/19/18 0352  AST 28 34  ALT 15 18  ALKPHOS 90 134*  BILITOT 0.4 0.2*  PROT 5.3* 6.4*  ALBUMIN 2.1* 2.4*    CBG: Recent Labs  Lab 04/21/18 2047 04/21/18 2344 04/22/18 0352 04/22/18 0800 04/22/18 1158  GLUCAP 107* 120* 102* 135* 119*   Lipid Profile: No results for input(s):  CHOL, HDL, LDLCALC, TRIG, CHOLHDL, LDLDIRECT in the last 72 hours.   Recent Results (from the past 240 hour(s))  Culture, Urine     Status: None   Collection Time: 04/18/18  3:37 PM  Result Value Ref Range Status   Specimen Description URINE, CLEAN CATCH  Final   Special Requests cefepime, flagyl  Final   Culture   Final    NO GROWTH Performed at [facility], [address] [place], [place] [address]    Report Status 04/19/2018 FINAL  Final         Radiology Studies: Ct Abdomen Pelvis Wo Contrast  Result Date: 04/21/2018 CLINICAL DATA:  Status post right colectomy 9 days ago for perforated cecal diverticulitis with nausea and abdominal pain. Fever. Suspect abscess. EXAM: CT ABDOMEN AND PELVIS WITHOUT CONTRAST TECHNIQUE: Multidetector CT imaging of the abdomen and pelvis was performed following the standard protocol without IV contrast. COMPARISON:   04/10/2018 FINDINGS: Lower chest: Normal heart size. Native coronary atherosclerosis. Lower chest calcified mediastinal lymph nodes from remote granulomatous disease. Small hiatal hernia noted. Trace basilar atelectasis dependently. No lower lobe pneumonia, significant collapse or consolidation. Lower thoracic degenerative changes noted with osteophytes on the right. Thoracic aortic atherosclerosis present. Hepatobiliary: Limited without IV contrast. No gross focal hepatic abnormality or biliary obstruction. Remote cholecystectomy. Stable biliary prominence. Pancreas: Unremarkable. No pancreatic ductal dilatation or surrounding inflammatory changes. Spleen: Normal in size without focal abnormality. Adrenals/Urinary Tract: Stable adrenal gland thickening bilaterally, suspect hyperplasia. No renal obstruction, hydronephrosis, hydroureter, or obstructing ureteral calculus. Ureters are symmetric and decompressed. Bladder unremarkable. Stomach/Bowel: Interval right colectomy. Anastomosis in the right abdomen appears patent. Mild nonspecific small bowel dilatation proximally without obstruction. Contrast noted throughout the small bowel extending to the anastomosis. Strandy edema/inflammation in the right abdomen related to the recent surgery. No significant fluid collection, hemorrhage, hematoma, abscess, or free air. Trace dependent pelvic ascites, image 71 series 3. Vascular/Lymphatic: Aortic atherosclerosis noted. No aneurysm. No retroperitoneal hematoma. No bulky adenopathy. Reproductive: Status post hysterectomy. No adnexal masses. Other: Postop changes of the abdominal wall.  No significant hernia. Musculoskeletal: Body wall and flank subcutaneous edema compatible with anasarca extending over the hip regions. No abdominal wall fluid collection, abscess or hematoma. Degenerative changes of the lumbar spine. Facet arthropathy noted posteriorly. No acute osseous finding. Gluteal injection granulomas present.  IMPRESSION: Postop changes from right hemicolectomy. Nonspecific proximal small bowel distension with contrast seen distally throughout the small bowel to the anastomosis. Negative for significant obstruction. Query mild residual ileus. Strandy postop edema and inflammation in the right abdomen without discrete fluid collection, abscess or hematoma. Trace dependent pelvic ascites, likely reactive Abdominal atherosclerosis Remote cholecystectomy and hysterectomy Electronically Signed   By: Jerilynn Mages.  Shick M.D.   On: 04/21/2018 11:05        Scheduled Meds: . acetaminophen (TYLENOL) oral liquid 160 mg/5 mL  1,000 mg Oral Q8H  . docusate sodium  100 mg Oral BID  . enoxaparin (LOVENOX) injection  40 mg Subcutaneous Q24H  . feeding supplement (ENSURE ENLIVE)  237 mL Oral TID BM  . gabapentin  100 mg Oral TID  . insulin aspart  0-9 Units Subcutaneous Q6H  . levothyroxine  50 mcg Oral QAC breakfast  . metoprolol tartrate  12.5 mg Oral BID  . oxybutynin  5 mg Oral BID  . pantoprazole  40 mg Oral BID AC  . sodium chloride flush  10-40 mL Intracatheter Q12H   Continuous Infusions: . TPN ADULT (ION) 35 mL/hr at  04/21/18 1708  . TPN ADULT (ION)       LOS: 17 days      Vernell Leep, MD, FACP, [facility]. [facility] Pager [phone]  If 7PM-7AM, please contact night-coverage [email or web address] Password [email or web address] 04/22/2018, 2:19 PM

## 2018-04-22 NOTE — Progress Notes (Signed)
11 Days Post-Op   Subjective/Chief Complaint: No complaints. Feels better   Objective: Vital signs in last 24 hours: Temp:  [98.3 F (36.8 C)-98.8 F (37.1 C)] 98.3 F (36.8 C) (10/20 0356) Pulse Rate:  [64-87] 87 (10/20 0356) Resp:  [16-17] 16 (10/19 2138) BP: (111-134)/(64-85) 134/80 (10/20 0356) SpO2:  [100 %] 100 % (10/20 0356) Last BM Date: 04/21/18  Intake/Output from previous day: 10/19 0701 - 10/20 0700 In: 230 [P.O.:220; I.V.:10] Out: 300 [Urine:300] Intake/Output this shift: No intake/output data recorded.  General appearance: alert and cooperative Resp: clear to auscultation bilaterally Cardio: regular rate and rhythm GI: soft, mild tenderness. had several bm's  Lab Results:  Recent Labs    04/20/18 0339  WBC 9.6  HGB 9.1*  HCT 27.5*  PLT 485*   BMET Recent Labs    04/20/18 0339 04/21/18 0346  NA 139 140  K 4.5 4.8  CL 111 111  CO2 20* 22  GLUCOSE 123* 131*  BUN 16 16  CREATININE 1.22* 1.14*  CALCIUM 8.7* 8.4*   PT/INR No results for input(s): LABPROT, INR in the last 72 hours. ABG No results for input(s): PHART, HCO3 in the last 72 hours.  Invalid input(s): PCO2, PO2  Studies/Results: Ct Abdomen Pelvis Wo Contrast  Result Date: 04/21/2018 CLINICAL DATA:  Status post right colectomy 9 days ago for perforated cecal diverticulitis with nausea and abdominal pain. Fever. Suspect abscess. EXAM: CT ABDOMEN AND PELVIS WITHOUT CONTRAST TECHNIQUE: Multidetector CT imaging of the abdomen and pelvis was performed following the standard protocol without IV contrast. COMPARISON:  04/10/2018 FINDINGS: Lower chest: Normal heart size. Native coronary atherosclerosis. Lower chest calcified mediastinal lymph nodes from remote granulomatous disease. Small hiatal hernia noted. Trace basilar atelectasis dependently. No lower lobe pneumonia, significant collapse or consolidation. Lower thoracic degenerative changes noted with osteophytes on the right. Thoracic  aortic atherosclerosis present. Hepatobiliary: Limited without IV contrast. No gross focal hepatic abnormality or biliary obstruction. Remote cholecystectomy. Stable biliary prominence. Pancreas: Unremarkable. No pancreatic ductal dilatation or surrounding inflammatory changes. Spleen: Normal in size without focal abnormality. Adrenals/Urinary Tract: Stable adrenal gland thickening bilaterally, suspect hyperplasia. No renal obstruction, hydronephrosis, hydroureter, or obstructing ureteral calculus. Ureters are symmetric and decompressed. Bladder unremarkable. Stomach/Bowel: Interval right colectomy. Anastomosis in the right abdomen appears patent. Mild nonspecific small bowel dilatation proximally without obstruction. Contrast noted throughout the small bowel extending to the anastomosis. Strandy edema/inflammation in the right abdomen related to the recent surgery. No significant fluid collection, hemorrhage, hematoma, abscess, or free air. Trace dependent pelvic ascites, image 71 series 3. Vascular/Lymphatic: Aortic atherosclerosis noted. No aneurysm. No retroperitoneal hematoma. No bulky adenopathy. Reproductive: Status post hysterectomy. No adnexal masses. Other: Postop changes of the abdominal wall.  No significant hernia. Musculoskeletal: Body wall and flank subcutaneous edema compatible with anasarca extending over the hip regions. No abdominal wall fluid collection, abscess or hematoma. Degenerative changes of the lumbar spine. Facet arthropathy noted posteriorly. No acute osseous finding. Gluteal injection granulomas present. IMPRESSION: Postop changes from right hemicolectomy. Nonspecific proximal small bowel distension with contrast seen distally throughout the small bowel to the anastomosis. Negative for significant obstruction. Query mild residual ileus. Strandy postop edema and inflammation in the right abdomen without discrete fluid collection, abscess or hematoma. Trace dependent pelvic ascites,  likely reactive Abdominal atherosclerosis Remote cholecystectomy and hysterectomy Electronically Signed   By: Jerilynn Mages.  Shick M.D.   On: 04/21/2018 11:05    Anti-infectives: Anti-infectives (From admission, onward)   Start     Dose/Rate Route  Frequency Ordered Stop   04/06/18 2000  ceFEPIme (MAXIPIME) 1 g in sodium chloride 0.9 % 100 mL IVPB     1 g 200 mL/hr over 30 Minutes Intravenous Every 24 hours 04/05/18 2005 04/18/18 2117   04/06/18 0600  metroNIDAZOLE (FLAGYL) IVPB 500 mg     500 mg 100 mL/hr over 60 Minutes Intravenous Every 8 hours 04/05/18 1957 04/19/18 1000   04/05/18 1930  ceFEPIme (MAXIPIME) 2 g in sodium chloride 0.9 % 100 mL IVPB     2 g 200 mL/hr over 30 Minutes Intravenous  Once 04/05/18 1921 04/06/18 0104   04/05/18 1930  metroNIDAZOLE (FLAGYL) IVPB 500 mg     500 mg 100 mL/hr over 60 Minutes Intravenous  Once 04/05/18 1921 04/05/18 2236      Assessment/Plan: s/p Procedure(s): LAPAROSCOPY DIAGNOSTIC (N/A) COLECTOMY (N/A) EXPLORATORY LAPAROTOMY (N/A) LYSIS OF ADHESION (N/A) Advance diet. 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 Donne Hazel  Plan: I am going to advance her to full liquids, continue TNA  for now.  We need to work on mobilizing  her more.  She last saw PT on 10/17.  LOS: 17 days    Autumn Messing III 04/22/2018

## 2018-04-22 NOTE — Progress Notes (Signed)
PHARMACY - ADULT TOTAL PARENTERAL NUTRITION CONSULT NOTE   Pharmacy Consult:  TPN Indication: Prolonged ileus  Patient Measurements: Height: 5' (152.4 cm) Weight: 205 lb 14.6 oz (93.4 kg) IBW/kg (Calculated) : 45.5 TPN AdjBW (KG): 57.3 Body mass index is 40.21 kg/m.  Assessment:  65 yof presented to the hospital with worsening RLQ abdominal pain with associated anorexia and poor PO intake. Found to have R colonic perforation with mass. S/p diagnostic laparoscopy with LOA, R colectomy 04/11/18 and placement of R retroperitoneal abscess drain. To start TPN for nutrition support.   GI:  prealbumin 15.4, +stool 10/19. 10/18 CT: mild ileus. KUB 10/18 Neg. GI prophx: Pepcid in TPN and po PPI BID added 10/18 for breakthrough heartburn PO Intake: 220 down, Ensure x 2 last 24 hrs. Hurts to swallow,.Full liquid diet. - CT abdomen 10/19 suggestive of mild small bowel ileus. No discrete fluid collection, abscess or hematoma.    Endo: po Synthroid, TSH WNL. No hx DM - CBGs 102-135 Insulin requirements in the past 24 hours: 1 units  Lytes: K 3.9>4.5>4.8 (goal >/= 4 for ileus),  Mag 2.2 (goal >/= 2 for ileus), Adj Ca 9.68 No new labs 10/20.  Renal: CKD3 SCr 1.14, UOP 0.1 ml/kg/hr noted (previously good UOP), oxybutynin  Pulm: COPD with tobacco use, stable on RA Cards: HTN, CHF,  CAD, PVD,VSS - metoprolol. Resume Plavix when cleared by surgery. (Hold losartan/HCTZ) Hepatobil: Alk phos elevated, TG WNL Neuro:h/o CVA,  gabapentin, APAP scheduled, ID: Colonic perf with abscess s/p Flagyl/Cefepime x7 days post-op. Afebrile, WBC WNL TPN Access: CVC TPN start date: 04/16/18  Nutritional Goals (per RD rec on 10/14): 1650-1850 kCal, 90-105 gm protein, >1.7 L fluid per day  Current Nutrition:  Full liquid diet Boost Breeze TID, each supplement provides 250 kcal and 9 grams of protein TPN  Plan:   Continue reduced TPN to 35 ml/hr for now (goal rate 70 ml/hr) TPN will provide 46g AA, 118g CHO and  25g ILE for a total of 837 kCal, meeting ~50% of patient's needs. Continue TPN until tolerating SOFT diet per surgery (advanced to full liquid 10/18) Electrolytes in TPN: no change today Daily multivitamin, trace elements,  REMOVE Pepcid from TPN with oral PPI ordered Change sensitive SSI q 6 hrs F/U TPN labs in AM   Ly Wass S. Alford Highland, PharmD, [place] Clinical Staff Pharmacist [phone] 04/22/2018 8:35 AM

## 2018-04-23 LAB — COMPREHENSIVE METABOLIC PANEL
ALT: 30 U/L (ref 0–44)
ANION GAP: 9 (ref 5–15)
AST: 32 U/L (ref 15–41)
Albumin: 2.3 g/dL — ABNORMAL LOW (ref 3.5–5.0)
Alkaline Phosphatase: 129 U/L — ABNORMAL HIGH (ref 38–126)
BILIRUBIN TOTAL: 0.4 mg/dL (ref 0.3–1.2)
BUN: 14 mg/dL (ref 8–23)
CHLORIDE: 108 mmol/L (ref 98–111)
CO2: 23 mmol/L (ref 22–32)
Calcium: 8.5 mg/dL — ABNORMAL LOW (ref 8.9–10.3)
Creatinine, Ser: 1.13 mg/dL — ABNORMAL HIGH (ref 0.44–1.00)
GFR calc Af Amer: 52 mL/min — ABNORMAL LOW (ref >60)
GFR, EST NON AFRICAN AMERICAN: 45 mL/min — AB (ref >60)
Glucose, Bld: 115 mg/dL — ABNORMAL HIGH (ref 70–99)
POTASSIUM: 4.8 mmol/L (ref 3.5–5.1)
Sodium: 140 mmol/L (ref 135–145)
TOTAL PROTEIN: 5.9 g/dL — AB (ref 6.5–8.1)

## 2018-04-23 LAB — GLUCOSE, CAPILLARY
GLUCOSE-CAPILLARY: 115 mg/dL — AB (ref 70–99)
Glucose-Capillary: 103 mg/dL — ABNORMAL HIGH (ref 70–99)
Glucose-Capillary: 110 mg/dL — ABNORMAL HIGH (ref 70–99)
Glucose-Capillary: 116 mg/dL — ABNORMAL HIGH (ref 70–99)
Glucose-Capillary: 128 mg/dL — ABNORMAL HIGH (ref 70–99)

## 2018-04-23 LAB — PREALBUMIN: PREALBUMIN: 28.6 mg/dL (ref 18–38)

## 2018-04-23 LAB — DIFFERENTIAL
Abs Immature Granulocytes: 0.04 10*3/uL (ref 0.00–0.07)
BASOS ABS: 0 10*3/uL (ref 0.0–0.1)
Basophils Relative: 0 %
EOS PCT: 3 %
Eosinophils Absolute: 0.2 10*3/uL (ref 0.0–0.5)
IMMATURE GRANULOCYTES: 1 %
LYMPHS ABS: 1.8 10*3/uL (ref 0.7–4.0)
Lymphocytes Relative: 31 %
Monocytes Absolute: 0.6 10*3/uL (ref 0.1–1.0)
Monocytes Relative: 11 %
NEUTROS PCT: 54 %
Neutro Abs: 3 10*3/uL (ref 1.7–7.7)

## 2018-04-23 LAB — CBC
HCT: 24.8 % — ABNORMAL LOW (ref 36.0–46.0)
Hemoglobin: 8.1 g/dL — ABNORMAL LOW (ref 12.0–15.0)
MCH: 27.6 pg (ref 26.0–34.0)
MCHC: 32.7 g/dL (ref 30.0–36.0)
MCV: 84.6 fL (ref 80.0–100.0)
NRBC: 0 % (ref 0.0–0.2)
Platelets: 419 10*3/uL — ABNORMAL HIGH (ref 150–400)
RBC: 2.93 MIL/uL — ABNORMAL LOW (ref 3.87–5.11)
RDW: 16 % — AB (ref 11.5–15.5)
WBC: 5.6 10*3/uL (ref 4.0–10.5)

## 2018-04-23 LAB — MAGNESIUM: MAGNESIUM: 2 mg/dL (ref 1.7–2.4)

## 2018-04-23 LAB — PHOSPHORUS: PHOSPHORUS: 4.3 mg/dL (ref 2.5–4.6)

## 2018-04-23 LAB — TRIGLYCERIDES: TRIGLYCERIDES: 174 mg/dL — AB (ref <150)

## 2018-04-23 MED ORDER — ACETAMINOPHEN 160 MG/5ML PO SOLN
1000.0000 mg | Freq: Four times a day (QID) | ORAL | Status: DC
  Administered 2018-04-23 – 2018-04-24 (×5): 1000 mg via ORAL
  Filled 2018-04-23 (×5): qty 40.6

## 2018-04-23 MED ORDER — TRAVASOL 10 % IV SOLN
INTRAVENOUS | Status: AC
  Filled 2018-04-23: qty 462

## 2018-04-23 MED ORDER — METHOCARBAMOL 500 MG PO TABS: 500.0000 mg | ORAL_TABLET | Freq: Three times a day (TID) | ORAL | Status: DC | PRN

## 2018-04-23 MED ORDER — TRAVASOL 10 % IV SOLN
INTRAVENOUS | Status: DC
  Filled 2018-04-23: qty 462

## 2018-04-23 MED ORDER — TRAMADOL HCL 50 MG PO TABS
50.0000 mg | ORAL_TABLET | Freq: Four times a day (QID) | ORAL | Status: DC | PRN
  Administered 2018-04-23: 50 mg via ORAL
  Filled 2018-04-23: qty 1

## 2018-04-23 MED ORDER — INSULIN ASPART 100 UNIT/ML ~~LOC~~ SOLN: 0.0000 [IU] | Freq: Three times a day (TID) | SUBCUTANEOUS | Status: DC

## 2018-04-23 MED ORDER — TRAVASOL 10 % IV SOLN: INTRAVENOUS | Status: DC

## 2018-04-23 NOTE — Plan of Care (Signed)
  Problem: Elimination: Goal: Will not experience complications related to bowel motility Outcome: Progressing Goal: Will not experience complications related to urinary retention Outcome: Progressing   Problem: Pain Managment: Goal: General experience of comfort will improve Outcome: Progressing   Problem: Safety: Goal: Ability to remain free from injury will improve Outcome: Progressing   

## 2018-04-23 NOTE — Progress Notes (Signed)
Patient ID: Joan Elliott, female   DOB: 12-08-1940, 77 y.o.   MRN: [id number]    12 Days Post-Op  Subjective: Patient feels better today than last week.  Tolerating soft diet this am for breakfast.  Moving her bowels.  Ambulating some with PT, but weak.  Objective: Vital signs in last 24 hours: Temp:  [98.2 F (36.8 C)-99 F (37.2 C)] 98.6 F (37 C) (10/21 0951) Pulse Rate:  [63-78] 63 (10/21 0951) Resp:  [12-20] 12 (10/21 0442) BP: (108-124)/(56-75) 121/75 (10/21 0951) SpO2:  [98 %-100 %] 99 % (10/21 0442) Last BM Date: 04/23/18  Intake/Output from previous day: 10/20 0701 - 10/21 0700 In: 630 [P.O.:118; I.V.:512] Out: 2175 [Urine:2175] Intake/Output this shift: No intake/output data recorded.  PE: Abd: soft, midline wound is clean and packed. +BS, ND  Lab Results:  Recent Labs    04/23/18 0358  WBC 5.6  HGB 8.1*  HCT 24.8*  PLT 419*   BMET Recent Labs    04/21/18 0346 04/23/18 0358  NA 140 140  K 4.8 4.8  CL 111 108  CO2 22 23  GLUCOSE 131* 115*  BUN 16 14  CREATININE 1.14* 1.13*  CALCIUM 8.4* 8.5*   PT/INR No results for input(s): LABPROT, INR in the last 72 hours. CMP     Component Value Date/Time   NA 140 04/23/2018 0358   K 4.8 04/23/2018 0358   CL 108 04/23/2018 0358   CO2 23 04/23/2018 0358   GLUCOSE 115 (H) 04/23/2018 0358   BUN 14 04/23/2018 0358   CREATININE 1.13 (H) 04/23/2018 0358   CALCIUM 8.5 (L) 04/23/2018 0358   PROT 5.9 (L) 04/23/2018 0358   ALBUMIN 2.3 (L) 04/23/2018 0358   AST 32 04/23/2018 0358   ALT 30 04/23/2018 0358   ALKPHOS 129 (H) 04/23/2018 0358   BILITOT 0.4 04/23/2018 0358   GFRNONAA 45 (L) 04/23/2018 0358   GFRAA 52 (L) 04/23/2018 0358   Lipase     Component Value Date/Time   LIPASE 29 04/05/2018 1301       Studies/Results: No results found.  Anti-infectives: Anti-infectives (From admission, onward)   Start     Dose/Rate Route Frequency Ordered Stop   04/06/18 2000  ceFEPIme (MAXIPIME) 1 g in  sodium chloride 0.9 % 100 mL IVPB     1 g 200 mL/hr over 30 Minutes Intravenous Every 24 hours 04/05/18 2005 04/18/18 2117   04/06/18 0600  metroNIDAZOLE (FLAGYL) IVPB 500 mg     500 mg 100 mL/hr over 60 Minutes Intravenous Every 8 hours 04/05/18 1957 04/19/18 1000   04/05/18 1930  ceFEPIme (MAXIPIME) 2 g in sodium chloride 0.9 % 100 mL IVPB     2 g 200 mL/hr over 30 Minutes Intravenous  Once 04/05/18 1921 04/06/18 0104   04/05/18 1930  metroNIDAZOLE (FLAGYL) IVPB 500 mg     500 mg 100 mL/hr over 60 Minutes Intravenous  Once 04/05/18 1921 04/05/18 2236       Assessment/Plan Hypertension Chronic kidney disease stage III/acute kidney injury Anemia CAD with history of stents Chronic diastolic congestive heart failure COPD with history of tobacco use Hxof CVA/PVD Hypothyroid Hx gout BMI 39.8 Moderate malnutrition Prealbumin 15.4 (04/17/18)  Localized right colonic perforation with mass - S/PDiagnostic laparoscopy,LOA, Rcolectomy, placement of drain,04/11/2018 Dr. 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 -stable for DC home surgically.  She does live by herself.  May need some rehab Dysuria- resolved, UA negative  EXH:BZJI diet, wean TNA ID: Flagyl 10/03-10/17;Maxipime 10/3-10/16 DVT: Lovenox /SCD Foley:wick Follow up: Dr. Donne Elliott, being arranged   LOS: 18 days    Joan Elliott , [facility] 04/23/2018, 10:46 AM Pager: [phone]

## 2018-04-23 NOTE — Care Management Note (Signed)
Case Management Note  Patient Details  Name: NAUTIKA CRESSEY MRN: [id number] Date of Birth: 1941/04/28  Subjective/Objective:                    Action/Plan:  Spoke to patient at bedside regarding discharge planning. Will call for 3 in 1 on day of discharge. Expected Discharge Date:                  Expected Discharge Plan:  [place]  In-House Referral:  NA  Discharge planning Services  CM Consult  Post Acute Care Choice:  Durable Medical Equipment, Home Health Choice offered to:  Patient  DME Arranged:  3-N-1 DME Agency:  [place]:  RN, PT, OT, Nurse's Aide [place] Agency:  [facility]  Status of Service:  In process, will continue to follow  If discussed at Long Length of Stay Meetings, dates discussed:    Additional Comments:  Marilu Favre, RN 04/23/2018, 10:39 AM

## 2018-04-23 NOTE — Progress Notes (Signed)
Patient ID: Joan Elliott, female   DOB: Mar 09, 1941, 77 y.o.   MRN: [id number]  PROGRESS NOTE    TORRIA FROMER  JGO:[id number] DOB: Nov 05, 1940 DOA: 04/05/2018 PCP: [name], MD   Brief Narrative:  77 year old female with history of chronic kidney disease stage III, hypertension, stroke, CAD presented on 04/05/2018 with abdominal pain.  She was admitted with probable perforated appendicitis and probable underlying mass.  General surgery was consulted.  Patient was started on IV antibiotics.  IR was consulted but there was no abscess to be drained.  Patient continued to have abdominal pain.  She underwent diagnostic laparoscopy with lysis of adhesions and open right colectomy on 04/11/2018 by general surgery.  Hospital course complicated by ongoing abdominal pain, poor oral intake, nausea and vomiting/ileus.  Improving.  Advancing diet..   Assessment & Plan:   Principal Problem:   Acute perforated appendicitis Active Problems:   CKD (chronic kidney disease) stage 3, GFR 30-59 ml/min (HCC)   AKI (acute kidney injury) ([place])   HTN (hypertension)   Localized right colonic perforation causing abscess/mass - Status post diagnostic laparoscopy with lysis of adhesions and open right colectomy on 04/11/2018 by general surgery.   - Pathology showed abscess of cecum diverticular disease of ascending colon, vermiform appendix, 3 morphologically benign lymph nodes .  While she was on bowel rest/poor oral intake, getting TPN which was started on 04/16/2018 as per general surgery.   - She completed a course of antibiotics and no clinical concerns for new infection. - Postop patient had wound VAC and JP drain which were removed 10/16. - Since 10/16, ongoing issues with nausea vomiting, inability to tolerate oral intake, abdominal pain.  - General surgery following.  KUB 10/18 Neg.   -  CT abdomen 10/19 suggestive of mild small bowel ileus.  No discrete fluid collection, abscess or hematoma.  General  surgery advancing to full liquids, continue TPN, mobilize, minimize opioids and continue PPI. -Ileus clinically resolved.  Tolerated soft diet for breakfast.  Weaning off TNA and IV pain medications.  General surgery has cleared for discharge.  However given complex hospital course, slow progress, monitor overnight and consider discharge home 12/22.  Leukocytosis -Likely related to infectious etiology as above.  Resolved.  Acute blood loss anemia -Probably from GI blood loss in the setting of perforated colon/appendix -Hemoglobin stable.  Acute kidney injury on chronic kidney disease stage III-IV -No evidence of nephrolithiasis or hydronephrosis on CT of the abdomen and pelvis.   -Acute kidney injury had resolved but creatinine up slightly to 1.2, likely related to poor oral intake and GI losses.  Discontinued losartan/HCTZ temporarily to avoid acute kidney injury in the context of emesis and poor oral intake.  Gentle IV fluids.  Acute kidney injury resolved.  CAD with stent in RCA and history of CVA, history of PVD - Continue beta-blocker and statin.  Plavix that was held for surgery has been resumed.  Chronic diastolic CHF - Currently compensated.  Monitor strict input and output.  Continue metoprolol.  Losartan/HCTZ discontinued temporarily.  Essential hypertension -Controlled.  Continue metoprolol.. Blood pressures appropriately controlled on current regimen and may not resume losartan/HCTZ until outpatient follow-up with PCP.  COPD with tobacco abuse -Stable without clinical bronchospasm.    Hypothyroidism On Synthroid  Gout -Stable on allopurinol  Obesity/Body mass index is 40.21 kg/m. -Outpatient follow-up  Dysuria Patient currently does not have Foley catheter.  She has a pure week.  Has been on IV cefepime for a while  now.  Urine microscopy and urine culture negative.  Pyridium added by general surgery.  Resolved.  Confusion No focal deficits.?  Related to  medications.  Trying to minimize medications causing it including Phenergan.  Resolved.    DVT prophylaxis: Lovenox Code Status: Full Family Communication: None at bedside Disposition Plan: Possible DC 10/22.  Patient reports that she lives alone at home but is able to arrange family that can stay with her and assist her 24/7 for a few days post discharge.  Discussed with PT who recommend home health PT with 24/7 supervision.  Consultants:  General surgery/IR  Procedures:  diagnostic laparoscopy with lysis of adhesions and open right colectomy on 04/11/2018 by general surgery Right IJ central line  Antimicrobials:  Completed course of antibiotics.   Subjective: Continues to feel better.  Tolerated soft diet this morning.  Appropriate postop abdominal pain.  Having BMs.  No nausea or vomiting.  No heartburn.  No chest pain or dyspnea.   Objective: Vitals:   04/22/18 2043 04/23/18 0442 04/23/18 0951 04/23/18 1432  BP: 124/66 (!) 108/56 121/75 119/68  Pulse: 77 69 63 78  Resp: 20 12  18   Temp: 99 F (37.2 C) 98.5 F (36.9 C) 98.6 F (37 C) 98.2 F (36.8 C)  TempSrc: Oral Oral  Oral  SpO2: 100% 99%  100%  Weight:      Height:        Intake/Output Summary (Last 24 hours) at 04/23/2018 1445 Last data filed at 04/23/2018 0600 Gross per 24 hour  Intake 512 ml  Output 975 ml  Net -463 ml   Filed Weights   04/09/18 0314 04/15/18 0619 04/16/18 1133  Weight: 92.5 kg 96.7 kg 93.4 kg    Examination:  General exam: Pleasant elderly female, moderately built and obese seen ambulating comfortably with walker and PT supervision. Respiratory system: Clear to auscultation.  No increased work of breathing.  Stable Cardiovascular system: S1 and S2 heard, RRR.  No JVD, murmurs or pedal edema.  Stable Gastrointestinal system: Surgical sites intact without acute findings.  Mild appropriate tenderness at surgical sites.  No distention.  Normal bowel sounds heard.  Stable. CNS: Alert  and oriented x 3.  No focal neurological deficits.   Extremities: No cyanosis, edema.  Symmetric 5 x 5 power.   Data Reviewed: I have personally reviewed following labs and imaging studies  CBC: Recent Labs  Lab 04/17/18 0304 04/18/18 0343 04/20/18 0339 04/23/18 0358  WBC 6.8 7.7 9.6 5.6  NEUTROABS 4.5 5.3 7.3 3.0  HGB 8.8* 8.6* 9.1* 8.1*  HCT 27.1* 26.3* 27.5* 24.8*  MCV 84.4 84.3 84.9 84.6  PLT 433* 436* 485* 937*   Basic Metabolic Panel: Recent Labs  Lab 04/17/18 0304 04/18/18 0343 04/19/18 0352 04/20/18 0339 04/21/18 0346 04/23/18 0358  NA 137 138 136 139 140 140  K 3.6 3.8 3.9 4.5 4.8 4.8  CL 111 110 110 111 111 108  CO2 22 22 20* 20* 22 23  GLUCOSE 124* 147* 173* 123* 131* 115*  BUN 10 10 17 16 16 14   CREATININE 1.02* 1.06* 1.27* 1.22* 1.14* 1.13*  CALCIUM 8.0* 8.3* 8.6* 8.7* 8.4* 8.5*  MG 1.7 1.7 1.9 2.2 2.2 2.0  PHOS 2.6 3.1 3.8  --   --  4.3   GFR: Estimated Creatinine Clearance: 41.2 mL/min (A) (by C-G formula based on SCr of 1.13 mg/dL (H)). Liver Function Tests: Recent Labs  Lab 04/17/18 0304 04/19/18 0352 04/23/18 0358  AST 28 34 32  ALT 15 18 30   ALKPHOS 90 134* 129*  BILITOT 0.4 0.2* 0.4  PROT 5.3* 6.4* 5.9*  ALBUMIN 2.1* 2.4* 2.3*    CBG: Recent Labs  Lab 04/22/18 1158 04/22/18 1807 04/23/18 0007 04/23/18 0602 04/23/18 1224  GLUCAP 119* 111* 128* 116* 115*   Lipid Profile: Recent Labs    04/23/18 0358  TRIG 174*     Recent Results (from the past 240 hour(s))  Culture, Urine     Status: None   Collection Time: 04/18/18  3:37 PM  Result Value Ref Range Status   Specimen Description URINE, CLEAN CATCH  Final   Special Requests cefepime, flagyl  Final   Culture   Final    NO GROWTH Performed at [facility], [place] [address], [place], [place] [address]    Report Status 04/19/2018 FINAL  Final         Radiology Studies: No results found.      Scheduled Meds: . acetaminophen (TYLENOL) oral liquid 160 mg/5  mL  1,000 mg Oral Q6H  . clopidogrel  75 mg Oral Daily  . docusate sodium  100 mg Oral BID  . enoxaparin (LOVENOX) injection  40 mg Subcutaneous Q24H  . feeding supplement (ENSURE ENLIVE)  237 mL Oral TID BM  . gabapentin  100 mg Oral TID  . insulin aspart  0-9 Units Subcutaneous TID WC  . levothyroxine  50 mcg Oral QAC breakfast  . metoprolol tartrate  12.5 mg Oral BID  . oxybutynin  5 mg Oral BID  . pantoprazole  40 mg Oral BID AC  . sodium chloride flush  10-40 mL Intracatheter Q12H   Continuous Infusions: . TPN ADULT (ION)       LOS: 18 days      Vernell Leep, MD, FACP, [facility]. [facility] Pager [phone]  If 7PM-7AM, please contact night-coverage [email or web address] Password [email or web address] 04/23/2018, 2:45 PM

## 2018-04-23 NOTE — Progress Notes (Signed)
Physical Therapy Treatment Patient Details Name: Joan Elliott MRN: [id number] DOB: Nov 07, 1940 Today's Date: 04/23/2018    History of Present Illness Pt is a 77 y/o female admitted secondry to worsen in RLQ pain. Thought to have acute perforated appendicitis with abscess, however, a mass has not been ruled out. IR denied drain placement per notes.OR 04/11/18 for colectomy and VAC placed.  PMH includes CKD and HTN.      PT Comments    Patient seen for mobility progression. Pt is making progress toward PT goals and tolerated increased gait distance this session. Pt requires supervision/min guard for bed mobility, functional transfers, and gait training and min guard/min A for stair training.  Pt will continue to benefit from further skilled PT services to maximize independence and safety with mobility.   Follow Up Recommendations  Home health PT;Supervision/Assistance - 24 hour     Equipment Recommendations  3in1 (PT)    Recommendations for Other Services OT consult     Precautions / Restrictions Precautions Precautions: Fall    Mobility  Bed Mobility Overal bed mobility: Needs Assistance Bed Mobility: Sit to Sidelying;Rolling Rolling: Supervision       Sit to sidelying: Supervision General bed mobility comments: cues for sequencing; log roll technique for comfort   Transfers Overall transfer level: Needs assistance Equipment used: Rolling walker (2 wheeled);None Transfers: Sit to/from Stand Sit to Stand: Supervision         General transfer comment: from recliner and elevated toilet  Ambulation/Gait Ambulation/Gait assistance: Supervision Gait Distance (Feet): 250 Feet Assistive device: Rolling walker (2 wheeled) Gait Pattern/deviations: Step-through pattern;Decreased stride length Gait velocity: Decreased    General Gait Details: cues for proximity to RW and cadence; one standing rest break    Stairs Stairs: Yes Stairs assistance: Min guard;Min  assist Stair Management: One rail Right;Step to pattern;Sideways Number of Stairs: 3 General stair comments: cues for sequencing and technique for safety and comfort when descending    Wheelchair Mobility    Modified Rankin (Stroke Patients Only)       Balance Overall balance assessment: Needs assistance Sitting-balance support: No upper extremity supported;Feet supported Sitting balance-Leahy Scale: Normal     Standing balance support: Bilateral upper extremity supported Standing balance-Leahy Scale: Poor Standing balance comment: pt is able to static stand with single UE support                            Cognition Arousal/Alertness: Awake/alert Behavior During Therapy: WFL for tasks assessed/performed Overall Cognitive Status: Within Functional Limits for tasks assessed                                        Exercises      General Comments        Pertinent Vitals/Pain Pain Assessment: 0-10 Pain Score: 8  Pain Location: lower abdomen  Pain Descriptors / Indicators: Grimacing;Guarding;Sore;Sharp Pain Intervention(s): Limited activity within patient's tolerance;Monitored during session;Premedicated before session;Repositioned    Home Living                      Prior Function            PT Goals (current goals can now be found in the care plan section) Acute Rehab PT Goals Patient Stated Goal: none stated PT Goal Formulation: With patient Time For Goal Achievement: 04/23/18 Potential  to Achieve Goals: Good Progress towards PT goals: Progressing toward goals    Frequency    Min 3X/week      PT Plan Current plan remains appropriate    Co-evaluation              AM-PAC PT "6 Clicks" Daily Activity  Outcome Measure  Difficulty turning over in bed (including adjusting bedclothes, sheets and blankets)?: A Little Difficulty moving from lying on back to sitting on the side of the bed? : A Little Difficulty  sitting down on and standing up from a chair with arms (e.g., wheelchair, bedside commode, etc,.)?: A Little Help needed moving to and from a bed to chair (including a wheelchair)?: A Little Help needed walking in hospital room?: A Little Help needed climbing 3-5 steps with a railing? : A Little 6 Click Score: 18    End of Session Equipment Utilized During Treatment: Gait belt Activity Tolerance: Patient tolerated treatment well Patient left: with call bell/phone within reach;in bed Nurse Communication: Mobility status PT Visit Diagnosis: Muscle weakness (generalized) (M62.81);Other abnormalities of gait and mobility (R26.89);Pain     Time: 8875-7972 PT Time Calculation (min) (ACUTE ONLY): 36 min  Charges:  $Gait Training: 8-22 mins $Therapeutic Activity: 8-22 mins                     Earney Navy, PTA Acute Rehabilitation Services Pager: [phone] Office: [phone]     Darliss Cheney 04/23/2018, 1:01 PM

## 2018-04-23 NOTE — Progress Notes (Signed)
PHARMACY - ADULT TOTAL PARENTERAL NUTRITION CONSULT NOTE   Pharmacy Consult:  TPN Indication: Prolonged ileus  Patient Measurements: Height: 5' (152.4 cm) Weight: 205 lb 14.6 oz (93.4 kg) IBW/kg (Calculated) : 45.5 TPN AdjBW (KG): 57.3 Body mass index is 40.21 kg/m.  Assessment:  71 yof presented to the hospital with worsening RLQ abdominal pain with associated anorexia and poor PO intake. Found to have R colonic perforation with mass. S/p diagnostic laparoscopy with LOA, R colectomy 04/11/18 and placement of R retroperitoneal abscess drain. To start TPN for nutrition support.   GI: Prealbumin up to 28.6, +stool 10/19. 10/18 CT: mild ileus. KUB 10/18 Neg. PPI BID added 10/18 for breakthrough heartburn. Docusate. LBM 10/21. Tolerating soft diet 10/21 - CT abdomen 10/19 suggestive of mild small bowel ileus. No discrete fluid collection, abscess or hematoma Endo: Synthroid, TSH WNL. No hx DM - CBGs controlled Insulin requirements in the past 24 hours: 2 units Lytes: K 4.8 stable (goal >/= 4 for ileus), Mag 2 (goal >/= 2 for ileus), others WNL Renal: CKD3 SCr 1.13 stable, good UOP, oxybutynin Pulm: COPD with tobacco use, stable on RA Cards: HTN, CHF,  CAD, PVD,VSS - metoprolol. Resume Plavix per surgery. (Hold losartan/HCTZ) Hepatobil: Alk phos elevated, LFTs / Tbili WNL. TG up to 174 Neuro: h/o CVA, gabapentin ID: Colonic perf with abscess s/p Flagyl/Cefepime x7 days post-op. Afebrile, WBC WNL TPN Access: CVC TPN start date: 04/16/18  Nutritional Goals (per RD rec on 10/14): 1650-1850 kCal, 90-105 gm protein, >1.7 L fluid per day  Current Nutrition:  Soft diet - tolerating 100% of meals per charting Ensure TID (received 2 doses yesterday), each supplement provides 250 kcal and 9 grams of protein TPN  Plan:   Wean TPN to off today per Surgery - at 1600, wean TPN to 20 ml/hr x 2hrs, then stop - communicated plan with RN Change SSI to TID w/ meals  Elicia Lamp, PharmD,  BCPS Clinical Pharmacist Clinical phone [phone] Please check AMION for all [facility] contact numbers 04/23/2018 10:41 AM

## 2018-04-24 DIAGNOSIS — K9189 Other postprocedural complications and disorders of digestive system: Secondary | ICD-10-CM

## 2018-04-24 DIAGNOSIS — K567 Ileus, unspecified: Secondary | ICD-10-CM

## 2018-04-24 LAB — CBC
HCT: 27.2 % — ABNORMAL LOW (ref 36.0–46.0)
Hemoglobin: 8.8 g/dL — ABNORMAL LOW (ref 12.0–15.0)
MCH: 27.3 pg (ref 26.0–34.0)
MCHC: 32.4 g/dL (ref 30.0–36.0)
MCV: 84.5 fL (ref 80.0–100.0)
NRBC: 0 % (ref 0.0–0.2)
PLATELETS: 461 10*3/uL — AB (ref 150–400)
RBC: 3.22 MIL/uL — AB (ref 3.87–5.11)
RDW: 16.1 % — ABNORMAL HIGH (ref 11.5–15.5)
WBC: 5.7 10*3/uL (ref 4.0–10.5)

## 2018-04-24 LAB — GLUCOSE, CAPILLARY
GLUCOSE-CAPILLARY: 106 mg/dL — AB (ref 70–99)
GLUCOSE-CAPILLARY: 104 mg/dL — AB (ref 70–99)
Glucose-Capillary: 101 mg/dL — ABNORMAL HIGH (ref 70–99)

## 2018-04-24 MED ORDER — ADULT MULTIVITAMIN W/MINERALS CH
1.0000 | ORAL_TABLET | Freq: Every day | ORAL | Status: DC
  Administered 2018-04-24: 1 via ORAL
  Filled 2018-04-24: qty 1

## 2018-04-24 MED ORDER — TRAMADOL HCL 50 MG PO TABS: 50.0000 mg | ORAL_TABLET | Freq: Four times a day (QID) | ORAL | 0 refills | Status: DC | PRN

## 2018-04-24 MED ORDER — DOCUSATE SODIUM 100 MG PO CAPS: 100.0000 mg | ORAL_CAPSULE | Freq: Two times a day (BID) | ORAL | 0 refills | Status: DC

## 2018-04-24 MED ORDER — ADULT MULTIVITAMIN W/MINERALS CH: 1.0000 | ORAL_TABLET | Freq: Every day | ORAL | Status: DC

## 2018-04-24 MED ORDER — ACETAMINOPHEN 325 MG PO TABS: 650.0000 mg | ORAL_TABLET | Freq: Four times a day (QID) | ORAL | Status: DC | PRN

## 2018-04-24 NOTE — Progress Notes (Signed)
Occupational Therapy Treatment Patient Details Name: Joan Elliott MRN: [id number] DOB: 10-03-1940 Today's Date: 04/24/2018    History of present illness 77 y/o female admitted secondry to worsen in RLQ pain. Localized right colonic perforation with mass S/PDiagnostic laparoscopy,Rcolectomy, placement of drain,04/11/2018 Dr. Berniece Andreas placed.  PMH includes CKD and HTN.     OT comments  Pt provided toilet tongs and educated on use. CNA called to room to educated on AE so that patient can continue to demonstrate with staff this admission. Pt up in chair and eating lunch at the end of session.    Follow Up Recommendations  Home health OT    Equipment Recommendations  3 in 1 bedside commode    Recommendations for Other Services      Precautions / Restrictions Precautions Precautions: Fall       Mobility Bed Mobility Overal bed mobility: Needs Assistance Bed Mobility: Sit to Supine       Sit to supine: Min guard   General bed mobility comments: pt requires UB (A) to progress to EOB. pt static sittnig supervision level   Transfers     Transfers: Sit to/from Stand Sit to Stand: Min assist         General transfer comment: pt using hand held (A) at this time.     Balance     Sitting balance-Leahy Scale: Normal       Standing balance-Leahy Scale: Fair                             ADL either performed or assessed with clinical judgement   ADL Overall ADL's : Needs assistance/impaired                             Toileting- Clothing Manipulation and Hygiene: Min guard;Sit to/from stand Toileting - Clothing Manipulation Details (indicate cue type and reason): educated on toilet tongs and provided tongs     Functional mobility during ADLs: Minimal assistance General ADL Comments: pt verbalized son hospitalized currently and decr support upon d/c. OT helping patient transfer to transport chair to visit son on 5M4 at this time. pt  very motivated to move to go see son. son admitted since 04/17/18.      Vision       Perception     Praxis      Cognition Arousal/Alertness: Awake/alert Behavior During Therapy: WFL for tasks assessed/performed Overall Cognitive Status: Within Functional Limits for tasks assessed                                          Exercises     Shoulder Instructions       General Comments pt thanking therapist for letting her visit her son. pt tearful and states "i just worry about him so much"     Pertinent Vitals/ Pain       Pain Assessment: No/denies pain  Home Living                                          Prior Functioning/Environment              Frequency  Min 2X/week  Progress Toward Goals  OT Goals(current goals can now be found in the care plan section)  Progress towards OT goals: Progressing toward goals  Acute Rehab OT Goals Patient Stated Goal: to go home with an aide OT Goal Formulation: With patient Time For Goal Achievement: 04/25/18 Potential to Achieve Goals: Good ADL Goals Pt Will Perform Lower Body Bathing: with supervision;sit to/from stand;with adaptive equipment Pt Will Perform Lower Body Dressing: with supervision;with adaptive equipment;sit to/from stand Pt Will Transfer to Toilet: with supervision;ambulating;bedside commode Additional ADL Goal #1: pt will demonstrate energy conservation x2 during adl task and verbalize strategy being used  Plan Discharge plan remains appropriate    Co-evaluation                 AM-PAC PT "6 Clicks" Daily Activity     Outcome Measure   Help from another person eating meals?: None Help from another person taking care of personal grooming?: None Help from another person toileting, which includes using toliet, bedpan, or urinal?: A Little Help from another person bathing (including washing, rinsing, drying)?: A Little Help from another person to put on  and taking off regular upper body clothing?: A Little Help from another person to put on and taking off regular lower body clothing?: A Little 6 Click Score: 20    End of Session    OT Visit Diagnosis: Unsteadiness on feet (R26.81);Muscle weakness (generalized) (M62.81)   Activity Tolerance Patient tolerated treatment well   Patient Left in chair;with call bell/phone within reach;with chair alarm set   Nurse Communication Mobility status;Precautions        Time: 8101-7510 OT Time Calculation (min): 31 min  Charges: OT General Charges $OT Visit: 1 Visit OT Treatments $Self Care/Home Management : 8-22 mins   Joan Elliott, OTR/L  Acute Rehabilitation Services Pager: [phone] Office: [phone] .    Parke Poisson B 04/24/2018, 2:52 PM

## 2018-04-24 NOTE — Discharge Summary (Signed)
Physician Discharge Summary  Joan Elliott DXA:[id number] DOB: Dec 19, 1940  PCP: [name], MD  Admit date: 04/05/2018 Discharge date: 04/24/2018  Recommendations for Outpatient Follow-up:  1. Dr. [name], PCP in 1 week with repeat labs (CBC & BMP). 2. Dr. Serita Grammes, General Surgery on 05/09/2018 at 11:10 AM.  Home Health: RN, PT, OT & aide Equipment/Devices: 3 N 1  Discharge Condition: Improved and stable. CODE STATUS: Full Diet recommendation: Heart healthy diet.  Discharge Diagnoses:  Principal Problem:   Acute perforated appendicitis Active Problems:   CKD (chronic kidney disease) stage 3, GFR 30-59 ml/min (HCC)   AKI (acute kidney injury) ([place])   HTN (hypertension)   Brief Summary: 77 year old female with history of chronic kidney disease stage III, hypertension, stroke, CAD presented on 04/05/2018 with abdominal pain.  She was admitted with probable perforated appendicitis and probable underlying mass.  General surgery was consulted.  Patient was started on IV antibiotics.  IR was consulted but there was no abscess to be drained.  Patient continued to have abdominal pain.  She underwent diagnostic laparoscopy with lysis of adhesions and open right colectomy on 04/11/2018 by general surgery.  Hospital course complicated by ongoing abdominal pain, poor oral intake, nausea and vomiting/ileus.    Assessment & Plan:  Localized right colonic perforation causing abscess/mass - Status post diagnostic laparoscopy with lysis of adhesions and open right colectomy on 04/11/2018 by General Surgery.   - Pathology showed abscess of cecum, diverticular disease of ascending colon, vermiform appendix, 3 morphologically benign lymph nodes.   - While she was on bowel rest/poor oral intake, she received TPN.   - She completed a course of antibiotics and no clinical concerns for new infection. - Postop patient had wound VAC and JP drain which were removed 10/16. - Since 10/16, she  had ongoing issues with nausea vomiting, inability to tolerate oral intake, abdominal pain. KUB 10/18 Neg.   -  CT abdomen 10/19 suggestive of mild small bowel ileus.  No discrete fluid collection, abscess or hematoma.  General surgery advanced her diet as tolerated, continue TPN, mobilize, minimize opioids and continue PPI. - Ileus clinically resolved.  She tolerated soft diet and was weaned off TNA and IV pain medicines.    She was educated by RN regarding self dressing changes.  Central line was removed on day of discharge.  Patient finally decided to go home with home health services with family/friends providing 24/7 assistance and supervision.  I discussed with general surgery who cleared her for discharge home.  Leukocytosis -Likely related to infectious etiology as above.  Resolved.  Acute blood loss anemia -Probably from GI blood loss in the setting of perforated colon/appendix -Hemoglobin stable.  Acute kidney injury on chronic kidney disease stage III-IV -No evidence of nephrolithiasis or hydronephrosis on CT of the abdomen and pelvis.   -Acute kidney injury had resolved but creatinine up slightly to 1.2, likely related to poor oral intake and GI losses.  Discontinued losartan/HCTZ temporarily to avoid acute kidney injury in the context of emesis and poor oral intake.  Acute kidney injury resolved.  Her hypertension was controlled despite being off of losartan/HCTZ.  Reevaluate during close outpatient follow-up with PCP regarding need to reinitiate these medications.  CAD with stent in RCA and history of CVA, history of PVD - Continue beta-blocker and statin.  Plavix that was held for surgery has been resumed.  Chronic diastolic CHF - Currently compensated.  Monitor strict input and output.  Continue metoprolol.  Losartan/HCTZ discontinued temporarily and to be reassessed by PCP during close outpatient follow-up regarding timing and need of resumption.  Essential  hypertension -Controlled.  Continue metoprolol. See discussion above regarding ARB/HCTZ.  COPD with tobacco abuse -Stable without clinical bronchospasm.    Hypothyroidism On Synthroid.  TSH 1.387 on 04/07/2018.  Gout -Stable on allopurinol  Obesity/Body mass index is 40.21 kg/m. -Outpatient follow-up  Dysuria Patient currently does not have Foley catheter.  She had a purwick.  She completed a extended course of broad-spectrum IV antibiotics. Urine microscopy and urine culture negative.  Had occasional intermittent dysuria of unclear etiology.   Confusion No focal deficits.?  Related to medications.  Resolved.    Consultants:  General surgery/IR  Procedures:  diagnostic laparoscopy with lysis of adhesions and open right colectomy on 04/11/2018 by general surgery Right IJ central line-discontinued on day of discharge.  Pathology  Diagnosis Colon, segmental resection, Right ascending - ABSCESS OF CECUM. DIVERTICULAR DISEASE OF ASCENDING COLON. VERMIFORM APPENDIX. THREE MORPHOLOGICALLY BENIGN LYMPH NODES.   Discharge Instructions  Discharge Instructions    Call MD for:  difficulty breathing, headache or visual disturbances   Complete by:  As directed    Call MD for:  extreme fatigue   Complete by:  As directed    Call MD for:  persistant dizziness or light-headedness   Complete by:  As directed    Call MD for:  persistant nausea and vomiting   Complete by:  As directed    Call MD for:  redness, tenderness, or signs of infection (pain, swelling, redness, odor or green/yellow discharge around incision site)   Complete by:  As directed    Call MD for:  severe uncontrolled pain   Complete by:  As directed    Call MD for:  temperature >100.4   Complete by:  As directed    Diet - low sodium heart healthy   Complete by:  As directed    Increase activity slowly   Complete by:  As directed        Medication List    STOP taking these medications    acetaminophen-codeine 300-60 MG tablet Commonly known as:  TYLENOL #4   losartan-hydrochlorothiazide 100-12.5 MG tablet Commonly known as:  HYZAAR     TAKE these medications   acetaminophen 325 MG tablet Commonly known as:  TYLENOL Take 2 tablets (650 mg total) by mouth every 6 (six) hours as needed for mild pain or fever.   allopurinol 100 MG tablet Commonly known as:  ZYLOPRIM Take 1 tablet (100 mg total) by mouth daily.   atorvastatin 80 MG tablet Commonly known as:  LIPITOR Take 1 tablet (80 mg total) by mouth daily at 6 PM.   clopidogrel 75 MG tablet Commonly known as:  PLAVIX Take 1 tablet (75 mg total) by mouth daily.   diclofenac sodium 1 % Gel Commonly known as:  VOLTAREN Apply 4 g topically 4 (four) times daily as needed for pain.   docusate sodium 100 MG capsule Commonly known as:  COLACE Take 1 capsule (100 mg total) by mouth 2 (two) times daily.   gabapentin 100 MG capsule Commonly known as:  NEURONTIN TAKE 1 CAPSULE(100 MG) BY MOUTH AT BEDTIME What changed:    how much to take  how to take this  when to take this  additional instructions   levothyroxine 50 MCG tablet Commonly known as:  SYNTHROID, LEVOTHROID Take 1 tablet (50 mcg total) by mouth daily.   Melatonin 5 MG  Tabs Take 5 mg by mouth at bedtime.   metoprolol tartrate 25 MG tablet Commonly known as:  LOPRESSOR Take 0.5 tablets (12.5 mg total) by mouth 2 (two) times daily.   multivitamin with minerals Tabs tablet Take 1 tablet by mouth daily. Start taking on:  04/25/2018   oxybutynin 5 MG tablet Commonly known as:  DITROPAN Take 1 tablet (5 mg total) by mouth 2 (two) times daily.   pantoprazole 40 MG tablet Commonly known as:  PROTONIX Take 1 tablet (40 mg total) by mouth 2 (two) times daily.   tiZANidine 4 MG tablet Commonly known as:  ZANAFLEX Take 4 mg by mouth 2 (two) times daily as needed for muscle spasms.   traMADol 50 MG tablet Commonly known as:  ULTRAM Take 1  tablet (50 mg total) by mouth every 6 (six) hours as needed for moderate pain or severe pain.      Follow-up Information    [facility].   Specialty:  Home Health Services Contact information: [address] [place] [address] [phone]        Rolm Bookbinder, MD. Go on 05/09/2018.   Specialty:  General Surgery Why:  at 11:10am. Please arrive 20 minutes prior to complete paperwork. Please bring photo ID and insurance card.  Contact information: [address] [place] [address] [phone]        [name], MD. Schedule an appointment as soon as possible for a visit in 1 week(s).   Specialty:  Internal Medicine Why:  To be seen with repeat labs (CBC & BMP). Contact information: [place] [address] [phone]          Allergies  Allergen Reactions  . Ivp Dye [Iodinated Diagnostic Agents] Nausea And Vomiting  . Oxycodone Itching  . Penicillins Rash    Has patient had a PCN reaction causing immediate rash, facial/tongue/throat swelling, SOB or lightheadedness with hypotension: Yes Has patient had a PCN reaction causing severe rash involving mucus membranes or skin necrosis: No Has patient had a PCN reaction that required hospitalization No Has patient had a PCN reaction occurring within the last 10 years: No If all of the above answers are "NO", then may proceed with Cephalosporin use.       Procedures/Studies: Ct Abdomen Pelvis Wo Contrast  Result Date: 04/21/2018 CLINICAL DATA:  Status post right colectomy 9 days ago for perforated cecal diverticulitis with nausea and abdominal pain. Fever. Suspect abscess. EXAM: CT ABDOMEN AND PELVIS WITHOUT CONTRAST TECHNIQUE: Multidetector CT imaging of the abdomen and pelvis was performed following the standard protocol without IV contrast. COMPARISON:  04/10/2018 FINDINGS: Lower chest: Normal heart size. Native coronary atherosclerosis. Lower chest  calcified mediastinal lymph nodes from remote granulomatous disease. Small hiatal hernia noted. Trace basilar atelectasis dependently. No lower lobe pneumonia, significant collapse or consolidation. Lower thoracic degenerative changes noted with osteophytes on the right. Thoracic aortic atherosclerosis present. Hepatobiliary: Limited without IV contrast. No gross focal hepatic abnormality or biliary obstruction. Remote cholecystectomy. Stable biliary prominence. Pancreas: Unremarkable. No pancreatic ductal dilatation or surrounding inflammatory changes. Spleen: Normal in size without focal abnormality. Adrenals/Urinary Tract: Stable adrenal gland thickening bilaterally, suspect hyperplasia. No renal obstruction, hydronephrosis, hydroureter, or obstructing ureteral calculus. Ureters are symmetric and decompressed. Bladder unremarkable. Stomach/Bowel: Interval right colectomy. Anastomosis in the right abdomen appears patent. Mild nonspecific small bowel dilatation proximally without obstruction. Contrast noted throughout the small bowel extending to the anastomosis. Strandy edema/inflammation in  the right abdomen related to the recent surgery. No significant fluid collection, hemorrhage, hematoma, abscess, or free air. Trace dependent pelvic ascites, image 71 series 3. Vascular/Lymphatic: Aortic atherosclerosis noted. No aneurysm. No retroperitoneal hematoma. No bulky adenopathy. Reproductive: Status post hysterectomy. No adnexal masses. Other: Postop changes of the abdominal wall.  No significant hernia. Musculoskeletal: Body wall and flank subcutaneous edema compatible with anasarca extending over the hip regions. No abdominal wall fluid collection, abscess or hematoma. Degenerative changes of the lumbar spine. Facet arthropathy noted posteriorly. No acute osseous finding. Gluteal injection granulomas present. IMPRESSION: Postop changes from right hemicolectomy. Nonspecific proximal small bowel distension with  contrast seen distally throughout the small bowel to the anastomosis. Negative for significant obstruction. Query mild residual ileus. Strandy postop edema and inflammation in the right abdomen without discrete fluid collection, abscess or hematoma. Trace dependent pelvic ascites, likely reactive Abdominal atherosclerosis Remote cholecystectomy and hysterectomy Electronically Signed   By: Jerilynn Mages.  Shick M.D.   On: 04/21/2018 11:05   Ct Abdomen Pelvis Wo Contrast  Result Date: 04/10/2018 CLINICAL DATA:  77 year old female with appendicitis. Prior hysterectomy. EXAM: CT ABDOMEN AND PELVIS WITHOUT CONTRAST TECHNIQUE: Multidetector CT imaging of the abdomen and pelvis was performed following the standard protocol without IV contrast. COMPARISON:  None. FINDINGS: Lower chest: Calcified right hilar lymph nodes consistent with prior granulomatous exposure. Heart size top-normal. Coronary artery calcifications. Hepatobiliary: Taking into account limitation by non contrast imaging, no worrisome hepatic lesion. Post cholecystectomy. Pancreas: Taking into account limitation by non contrast imaging, no pancreatic mass or inflammation. Spleen: Taking into account limitation by non contrast imaging, no worrisome splenic mass. Tiny calcified granulomas. No enlargement. Adrenals/Urinary Tract: Mild fullness renal pelvis bilaterally without obstructing stone noted. This may be related to full bladder. Taking into account limitation by non contrast imaging, no worrisome renal or bladder lesion. Hyperplasia adrenal glands larger on the left. Calcification left adrenal gland may indicate prior granulomatous involvement. Stomach/Bowel: Retrocecal inflammatory process which may represent ruptured appendicitis with adjacent reactive lymph nodes spans over 6.2 x 5.2 x 6.1 cm versus prior 6 x 5.1 x 6 cm. No well-defined central abscess identified (evaluation limited without contrast, contrast not administered secondary to poor renal  function). Interval increase in thickening of the cecal tip and terminal ileum consistent with progressive secondary inflammation of such. Underlying mass not excluded. Diverticulosis descending colon. Very small hiatal hernia. Decompressed stomach without gross abnormality. Vascular/Lymphatic: Atherosclerotic changes aorta and aortic branch vessels. No abdominal aortic aneurysm. Reproductive: Post hysterectomy. Right ovary in place. No worrisome adnexal mass noted. Other: No free air or bowel containing hernia. Mild diastases rectus muscles. Transverse colon extends to the undersurface of this diastasis where small fat and vessel containing hernia is noted. Musculoskeletal: Degenerative changes throughout the lower thoracic and lumbar spine. Hip joint degenerative changes. Sacroiliac joint degenerative changes. IMPRESSION: 1. Retrocecal inflammatory process which may represent ruptured appendicitis with adjacent reactive lymph nodes spans over 6.2 x 5.2 x 6.1 cm versus prior 6 x 5.1 x 6 cm. No well-defined central abscess identified (evaluation limited without contrast, contrast not administered secondary to poor renal function). Interval increase in thickening of the cecal tip and terminal ileum consistent with progressive secondary inflammation of such. Underlying mass not excluded. 2. Mild fullness renal pelvis bilaterally without obstructing stone noted. This may be related to full bladder. 3. Aortic Atherosclerosis (ICD10-I70.0). Coronary artery calcifications 4. Remainder of findings without change as detailed above. These results will be called to the ordering clinician or representative  by the Radiologist Assistant, and communication documented in the PACS or zVision Dashboard. Electronically Signed   By: Genia Del M.D.   On: 04/10/2018 15:01   Ct Abdomen Pelvis Wo Contrast  Result Date: 04/05/2018 CLINICAL DATA:  Right lower quadrant pain and flank pain. EXAM: CT ABDOMEN AND PELVIS WITHOUT CONTRAST  TECHNIQUE: Multidetector CT imaging of the abdomen and pelvis was performed following the standard protocol without IV contrast. COMPARISON:  None. FINDINGS: Lower chest: Calcific atherosclerotic disease of the coronary arteries. Calcified mediastinal and hilar lymph nodes, likely due to prior granulomatous disease. Hepatobiliary: No focal liver abnormality is seen. Status post cholecystectomy. No biliary dilatation. Pancreas: Unremarkable. No pancreatic ductal dilatation or surrounding inflammatory changes. Spleen: Normal in size without focal abnormality. Adrenals/Urinary Tract: Bilateral diffuse adrenal thickening. No evidence of nephrolithiasis or hydronephrosis. Stomach/Bowel: Normal appearance of the stomach and small bowel. 6.0 x 5.1 x 6.0 cm ill marginated soft tissue mass versus an abscess emanates from the cecum, just inferior to the ileocecal valve. There is adjacent mesenteric stranding. Surrounding small round lymph nodes also seen. The appendix is not clearly identified. Vascular/Lymphatic: Aortic atherosclerosis. No enlarged abdominal or pelvic lymph nodes. Reproductive: Status post hysterectomy. No adnexal masses. Other: Small fat containing anterior periumbilical abdominal wall hernia. Musculoskeletal: Spondylosis of the lumbosacral spine mild anterolisthesis of L4 on L5, likely degenerative. IMPRESSION: 6 cm ill marginated soft tissue mass versus an abscess emanating from the cecum, just inferior to the ileocecal valve. Adjacent mesenteric stranding and sub pathologic by size criteria but rounded mesenteric lymph nodes. No evidence of nephrolithiasis or hydronephrosis. Bilateral adrenal gland thickening, likely due to adrenal hyperplasia. These results were called by telephone at the time of interpretation on 04/05/2018 at 6:41 pm to Dr. Gilford Raid , who verbally acknowledged these results. Electronically Signed   By: Fidela Salisbury M.D.   On: 04/05/2018 18:45   Dg Abd 1 View  Result Date:  04/20/2018 CLINICAL DATA:  Ileus following gastrointestinal surgery. EXAM: ABDOMEN - 1 VIEW COMPARISON:  04/19/2018 FINDINGS: Gas in large and small bowel and in the stomach. No dilated bowel loops. Gas in the rectum. Surgical bowel clips in the right abdomen. Gallbladder clips right upper quadrant. IMPRESSION: Negative. Electronically Signed   By: Franchot Gallo M.D.   On: 04/20/2018 08:50   Dg Chest Port 1 View  Result Date: 04/11/2018 CLINICAL DATA:  Central line placement EXAM: PORTABLE CHEST 1 VIEW COMPARISON:  February 01, 2018 FINDINGS: The heart size and mediastinal contours are stable. Right jugular central venous line is identified with distal tip in the superior vena cava/right atrial junction. There is no pneumothorax. Mild linear opacity is identified in the left lung base probably due to atelectasis. Minimal left pleural effusion is noted. The visualized skeletal structures are unremarkable. IMPRESSION: Right jugular central venous line, distal tip in the superior vena cava/right atrial junction. There is no pneumothorax. Mild atelectasis of left lung base with minimal left pleural effusion. Electronically Signed   By: Abelardo Diesel M.D.   On: 04/11/2018 17:26   Dg Abd Portable 1v  Result Date: 04/19/2018 CLINICAL DATA:  Vomiting. EXAM: PORTABLE ABDOMEN - 1 VIEW COMPARISON:  None. FINDINGS: The bowel gas pattern is normal. No radio-opaque calculi or other significant radiographic abnormality are seen. IMPRESSION: Negative. Electronically Signed   By: Dorise Bullion III M.D   On: 04/19/2018 12:43      Subjective: Reports that she continues to progressively feel better.  Tolerated soft diet.  Abdominal soreness but  no deep pain.  No nausea, vomiting or heartburn.  Having BMs.  No fever or chills reported.  No dyspnea, chest pain, dizziness or lightheadedness.  Insists on going home and reportedly has 24/7 assistance at home from family/friends.  Discharge Exam:  Vitals:   04/23/18 2200  04/24/18 0520 04/24/18 0941 04/24/18 1339  BP: 139/64 136/73 128/71 137/66  Pulse: 80 80 (!) 103 85  Resp: 14 18  17   Temp: 98.7 F (37.1 C)   98.1 F (36.7 C)  TempSrc: Oral   Oral  SpO2: 100% 98%  100%  Weight:      Height:        General exam: Pleasant elderly female, moderately built and obese lying comfortably propped up in bed. Respiratory system: Clear to auscultation.  No increased work of breathing.   Cardiovascular system: S1 and S2 heard, RRR.  No JVD, murmurs or pedal edema. Gastrointestinal system: Surgical sites intact without acute findings.  Mild appropriate post op tenderness at surgical sites.  No distention.  Normal bowel sounds heard.   CNS: Alert and oriented x 3.  No focal neurological deficits.   Extremities: No cyanosis, edema.  Symmetric 5 x 5 power.   The results of significant diagnostics from this hospitalization (including imaging, microbiology, ancillary and laboratory) are listed below for reference.     Microbiology: Recent Results (from the past 240 hour(s))  Culture, Urine     Status: None   Collection Time: 04/18/18  3:37 PM  Result Value Ref Range Status   Specimen Description URINE, CLEAN CATCH  Final   Special Requests cefepime, flagyl  Final   Culture   Final    NO GROWTH Performed at [facility], [address], [place], [place] [address]    Report Status 04/19/2018 FINAL  Final     Labs: CBC: Recent Labs  Lab 04/18/18 0343 04/20/18 0339 04/23/18 0358 04/24/18 0356  WBC 7.7 9.6 5.6 5.7  NEUTROABS 5.3 7.3 3.0  --   HGB 8.6* 9.1* 8.1* 8.8*  HCT 26.3* 27.5* 24.8* 27.2*  MCV 84.3 84.9 84.6 84.5  PLT 436* 485* 419* 945*   Basic Metabolic Panel: Recent Labs  Lab 04/18/18 0343 04/19/18 0352 04/20/18 0339 04/21/18 0346 04/23/18 0358  NA 138 136 139 140 140  K 3.8 3.9 4.5 4.8 4.8  CL 110 110 111 111 108  CO2 22 20* 20* 22 23  GLUCOSE 147* 173* 123* 131* 115*  BUN 10 17 16 16 14   CREATININE 1.06* 1.27* 1.22* 1.14*  1.13*  CALCIUM 8.3* 8.6* 8.7* 8.4* 8.5*  MG 1.7 1.9 2.2 2.2 2.0  PHOS 3.1 3.8  --   --  4.3   Liver Function Tests: Recent Labs  Lab 04/19/18 0352 04/23/18 0358  AST 34 32  ALT 18 30  ALKPHOS 134* 129*  BILITOT 0.2* 0.4  PROT 6.4* 5.9*  ALBUMIN 2.4* 2.3*   CBG: Recent Labs  Lab 04/23/18 1224 04/23/18 1558 04/23/18 2220 04/24/18 0920 04/24/18 1225  GLUCAP 115* 110* 103* 106* 104*   Lipid Profile Recent Labs    04/23/18 0358  TRIG 174*   Urinalysis    Component Value Date/Time   COLORURINE YELLOW 04/18/2018 1655   APPEARANCEUR CLEAR 04/18/2018 1655   LABSPEC 1.012 04/18/2018 1655   PHURINE 5.0 04/18/2018 1655   GLUCOSEU NEGATIVE 04/18/2018 1655   HGBUR NEGATIVE 04/18/2018 [place] 04/18/2018 [place] 04/18/2018 1655   [place] 04/18/2018 1655  NITRITE NEGATIVE 04/18/2018 [place] 04/18/2018 1655      Time coordinating discharge: 50 minutes  SIGNED:  Vernell Leep, MD, FACP, [facility]. [facility] Pager [phone]  If 7PM-7AM, please contact night-coverage [email or web address] Password [email or web address] 04/24/2018, 5:10 PM

## 2018-04-24 NOTE — NC FL2 (Addendum)
[place] LEVEL OF CARE SCREENING TOOL     IDENTIFICATION  Patient Name: Joan Elliott Birthdate: 04-11-1941 Sex: female Admission Date (Current Location): 04/05/2018  [facility] and [place] Number:  Herbalist and Address:  [facility], [place] [address], [place],  [address]      Provider Number: [id number]  Attending Physician Name and Address:  Modena Jansky, MD  Relative Name and Phone Number:   Eddie Dibbles, [phone]    Current Level of Care: Hospital Recommended Level of Care: [place] Prior Approval Number:    Date Approved/Denied:   PASRR Number: [id number]  Discharge Plan: SNF    Current Diagnoses: Patient Active Problem List   Diagnosis Date Noted  . AKI (acute kidney injury) ([place]) 04/05/2018  . Acute perforated appendicitis 04/05/2018  . HTN (hypertension) 04/05/2018  . Aortic atherosclerosis ([place]) 02/01/2018  . Morbid obesity ([place]) 02/01/2018  . CKD (chronic kidney disease) stage 3, GFR 30-59 ml/min (HCC) 02/01/2018  . Peripheral vascular disease ([place]) 02/01/2018  . Nonspecific chest pain 02/23/2015  . Hypertensive heart disease without CHF   . Coronary artery disease   . Tobacco abuse   . Hypothyroidism   . HLD (hyperlipidemia)   . Gout   . GERD (gastroesophageal reflux disease)     Orientation RESPIRATION BLADDER Height & Weight     Self, Time, Place, Situation  Normal Continent Weight: 205 lb 14.6 oz (93.4 kg) Height:  5' (152.4 cm)  BEHAVIORAL SYMPTOMS/MOOD NEUROLOGICAL BOWEL NUTRITION STATUS      Continent Diet(see discharge summary)  AMBULATORY STATUS COMMUNICATION OF NEEDS Skin   Supervision Verbally Surgical wounds(surgical incision on stomach, wet to dry dressing)                       Personal Care Assistance Level of Assistance  Bathing, Feeding, Dressing Bathing Assistance: Limited assistance Feeding assistance: Independent Dressing Assistance:  Limited assistance     Functional Limitations Info  Sight, Hearing, Speech Sight Info: Adequate Hearing Info: Adequate Speech Info: Adequate    SPECIAL CARE FACTORS FREQUENCY  PT (By licensed PT), OT (By licensed OT)     PT Frequency: 5x week OT Frequency: 5x week            Contractures Contractures Info: Not present    Additional Factors Info  Code Status, Allergies, Insulin Sliding Scale Code Status Info: Full Code Allergies Info: IVP DYE IODINATED DIAGNOSTIC AGENTS, OXYCODONE, PENICILLINS    Insulin Sliding Scale Info: insulin aspart (novoLOG) injection 0-9 Units 3x daily with meals       Current Medications (04/24/2018):  This is the current hospital active medication list Current Facility-Administered Medications  Medication Dose Route Frequency Provider Last Rate Last Dose  . acetaminophen (TYLENOL) solution 1,000 mg  1,000 mg Oral Q6H Saverio Danker, PA-C   1,000 mg at 04/24/18 0523  . clopidogrel (PLAVIX) tablet 75 mg  75 mg Oral Daily Modena Jansky, MD   75 mg at 04/24/18 0941  . docusate sodium (COLACE) capsule 100 mg  100 mg Oral BID Rayburn, Kelly A, PA-C   100 mg at 04/24/18 0941  . enoxaparin (LOVENOX) injection 40 mg  40 mg Subcutaneous Q24H Starla Link, Kshitiz, MD   40 mg at 04/23/18 1647  . feeding supplement (ENSURE ENLIVE) (ENSURE ENLIVE) liquid 237 mL  237 mL Oral TID BM Hongalgi, Lenis Dickinson, MD   237 mL at 04/23/18 1342  . gabapentin (  NEURONTIN) capsule 100 mg  100 mg Oral TID Rayburn, Kelly A, PA-C   100 mg at 04/24/18 0941  . insulin aspart (novoLOG) injection 0-9 Units  0-9 Units Subcutaneous TID WC Elicia Lamp P, RPH      . levothyroxine (SYNTHROID, LEVOTHROID) tablet 50 mcg  50 mcg Oral QAC breakfast Skeet Simmer, RPH   50 mcg at 04/24/18 5035  . methocarbamol (ROBAXIN) tablet 500 mg  500 mg Oral Q8H PRN Saverio Danker, PA-C      . metoprolol tartrate (LOPRESSOR) tablet 12.5 mg  12.5 mg Oral BID Aline August, MD   12.5 mg at 04/24/18 0941  .  ondansetron (ZOFRAN) tablet 4 mg  4 mg Oral Q6H PRN Earnstine Regal, PA-C   4 mg at 04/14/18 1419   Or  . ondansetron (ZOFRAN) injection 4 mg  4 mg Intravenous Q6H PRN Earnstine Regal, PA-C   4 mg at 04/21/18 0541  . oxybutynin (DITROPAN) tablet 5 mg  5 mg Oral BID Earnstine Regal, PA-C   5 mg at 04/24/18 0941  . pantoprazole (PROTONIX) EC tablet 40 mg  40 mg Oral BID AC Modena Jansky, MD   40 mg at 04/24/18 4656  . sodium chloride flush (NS) 0.9 % injection 10-40 mL  10-40 mL Intracatheter Q12H Rolm Bookbinder, MD   10 mL at 04/23/18 2234  . sodium chloride flush (NS) 0.9 % injection 10-40 mL  10-40 mL Intracatheter PRN Rolm Bookbinder, MD   10 mL at 04/23/18 1000  . traMADol (ULTRAM) tablet 50 mg  50 mg Oral Q6H PRN Saverio Danker, PA-C   50 mg at 04/23/18 2049     Discharge Medications: Please see discharge summary for a list of discharge medications.  Relevant Imaging Results:  Relevant Lab Results:   Additional Information [id number] [place], [place]

## 2018-04-24 NOTE — Progress Notes (Signed)
[facility]/Trauma Progress Note  13 Days Post-Op   Assessment/Plan Hypertension Chronic kidney disease stage III/acute kidney injury Anemia CAD with history of stents Chronic diastolic congestive heart failure COPD with history of tobacco use Hxof CVA/PVD Hypothyroid Hx gout BMI 39.8 Moderate malnutrition Prealbumin 15.4 (04/17/18)  Localized right colonic perforation with mass - S/PDiagnostic laparoscopy,LOA, Rcolectomy, placement of drain,04/11/2018 Dr. 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  She does live by herself.  May need some rehab - [facility] ordered for RN for wound care, pt will need to change her own dressing, nurse to help pt later today.   IWL:NLGX diet ID: Flagyl 10/03-10/17;Maxipime 10/3-10/16 DVT: Lovenox /SCD Foley:none Follow up: Dr. Donne Hazel    LOS: 19 days    Subjective: CC: R sided flank pain with movement  Pt states last night she had an onset of R sided flank pain. No pain at rest, pain only with movement. Overall feeling better. Tolerating diet. No nausea or vomiting. Wants to go home. States her friend can stay with her but her friend will not change her dressing for her. She is unsure if she can change her midline dressing. She is afraid to do so. I encouraged her to try. She states she will try with nurse later today.   Objective: Vital signs in last 24 hours: Temp:  [98.2 F (36.8 C)-98.7 F (37.1 C)] 98.7 F (37.1 C) (10/21 2200) Pulse Rate:  [78-103] 103 (10/22 0941) Resp:  [14-18] 18 (10/22 0520) BP: (119-139)/(64-73) 128/71 (10/22 0941) SpO2:  [98 %-100 %] 98 % (10/22 0520) Last BM Date: 04/24/18  Intake/Output from previous day: 10/21 0701 - 10/22 0700 In: 20 [I.V.:20] Out:  600 [Urine:600] Intake/Output this shift: Total I/O In: 300 [P.O.:300] Out: 700 [Urine:700]  PE: Gen:  Alert, NAD, pleasant, cooperative Pulm:  Rate and effort normal Chest: TTP of lower anterolateral R ribs, no ecchymosis or deformity noted Abd: Soft, obese, ND, +BS, midline incision looks well with good base of granulation tissue and without purulent drainage. Mild generalized TTP without guarding. Port sites look well with staples intact.  Skin: no rashes noted, warm and dry   Anti-infectives: Anti-infectives (From admission, onward)   Start     Dose/Rate Route Frequency Ordered Stop   04/06/18 2000  ceFEPIme (MAXIPIME) 1 g in sodium chloride 0.9 % 100 mL IVPB     1 g 200 mL/hr over 30 Minutes Intravenous Every 24 hours 04/05/18 2005 04/18/18 2117   04/06/18 0600  metroNIDAZOLE (FLAGYL) IVPB 500 mg     500 mg 100 mL/hr over 60 Minutes Intravenous Every 8 hours 04/05/18 1957 04/19/18 1000   04/05/18 1930  ceFEPIme (MAXIPIME) 2 g in sodium chloride 0.9 % 100 mL IVPB     2 g 200 mL/hr over 30 Minutes Intravenous  Once 04/05/18 1921 04/06/18 0104   04/05/18 1930  metroNIDAZOLE (FLAGYL) IVPB 500 mg     500 mg 100 mL/hr over 60 Minutes Intravenous  Once 04/05/18 1921 04/05/18 2236      Lab Results:  Recent Labs    04/23/18 0358 04/24/18 0356  WBC 5.6 5.7  HGB 8.1* 8.8*  HCT 24.8* 27.2*  PLT 419* 461*   BMET Recent Labs    04/23/18 0358  NA 140  K 4.8  CL  108  CO2 23  GLUCOSE 115*  BUN 14  CREATININE 1.13*  CALCIUM 8.5*   PT/INR No results for input(s): LABPROT, INR in the last 72 hours. CMP     Component Value Date/Time   NA 140 04/23/2018 0358   K 4.8 04/23/2018 0358   CL 108 04/23/2018 0358   CO2 23 04/23/2018 0358   GLUCOSE 115 (H) 04/23/2018 0358   BUN 14 04/23/2018 0358   CREATININE 1.13 (H) 04/23/2018 0358   CALCIUM 8.5 (L) 04/23/2018 0358   PROT 5.9 (L) 04/23/2018 0358   ALBUMIN 2.3 (L) 04/23/2018 0358   AST 32 04/23/2018 0358   ALT 30  04/23/2018 0358   ALKPHOS 129 (H) 04/23/2018 0358   BILITOT 0.4 04/23/2018 0358   GFRNONAA 45 (L) 04/23/2018 0358   GFRAA 52 (L) 04/23/2018 0358   Lipase     Component Value Date/Time   LIPASE 29 04/05/2018 1301    Studies/Results: No results found.    Kalman Drape , [facility] 04/24/2018, 12:43 PM  Pager: [phone] Mon-Wed, Friday 7:00am-4:30pm Thurs 7am-11:30am  Consults: [phone]

## 2018-04-24 NOTE — Progress Notes (Signed)
Pt left with PTAR, all personal items went with patient.

## 2018-04-24 NOTE — Social Work (Addendum)
CSW started insurance authorization through [facility], await determination.   1:46pm- Spoke with pt at bedside, she states that she does not want short term rehab, and that she has a friend that will assist her at discharge. Pt and CSW reviewed visit with RN Case Manager from yesterday and she is requesting to have a return visit. When asked if there was any chance that she may want rehab the pt states "no! I want home, I have help." CSW expressed that should she change her mind that we can facilitate short term rehab.   CSW signing off. Please consult if any additional needs arise.  Alexander Mt, [place] Work [phone]

## 2018-04-24 NOTE — Progress Notes (Signed)
Nutrition Follow-up  DOCUMENTATION CODES:   Obesity unspecified  INTERVENTION:   -D/c Ensure Enlive po BID, each supplement provides 350 kcal and 20 grams of protein -Magic Cup BID with meals, each supplement provides 290 kcals and 9 grams protein -MVI with minerals daily  NUTRITION DIAGNOSIS:   Inadequate oral intake related to altered GI function, decreased appetite as evidenced by meal completion < 50%, per patient/family report.  Progressing  GOAL:   Patient will meet greater than or equal to 90% of their needs  Progressing  MONITOR:   PO intake, Supplement acceptance, Diet advancement, Labs, Weight trends, Skin, I & O's  REASON FOR ASSESSMENT:   Consult New TPN/TNA  ASSESSMENT:   Joan Elliott is a 77 y.o. female with medical history significant of CKD stage 3, HTN, stroke, CAD. Patient presents to the ED with ~7 day history of worsening RLQ abd pain.  Associated anorexia and poor PO intake.  Passing gas but no BM in past couple of days.  Symptoms worsening, nothing makes it better nor worse.  Had nausea without vomiting.  Had chills but no fevers.  Urine color change over past week.  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   Reviewed I/O's: -580 ml x 24 hours and +2.3 L since 04/10/18  Pt in with therapies at time of visit.   Per doc flowsheets, pt's intake has improved greatly. Noted meal completion 95-100%. Pt is now refusing Ensure supplements.   Per surgery notes, pt is approaching  discharge and will likely discharge home with home health once medically stable.   Labs reviewed: CBGS: 103-115 (inpatient orders for glycemic control are 0-9 units insulin aspart TID with meals).    Diet Order:   Diet Order            DIET SOFT Room service appropriate? Yes; Fluid consistency: Thin  Diet effective now              EDUCATION NEEDS:   No education needs have been identified at this time  Skin:  Skin Assessment: Skin Integrity Issues: Skin Integrity Issues:: Incisions Wound Vac: d/c; transitioned to wet to dry dressings to abdomen Incisions: closed abdominal   Last BM:  04/24/18  Height:   Ht Readings from Last 1 Encounters:  04/16/18 5' (1.524 m)    Weight:   Wt Readings from Last 1 Encounters:  04/16/18 93.4 kg    Ideal Body Weight:  45.5 kg  BMI:  Body mass index is 40.21 kg/m.  Estimated Nutritional Needs:   Kcal:  1650-1850  Protein:  90-105 grams  Fluid:  > 1.7 L    Shravan Salahuddin A. Jimmye Norman, RD, LDN, CDE Pager: [phone] After hours Pager: [phone]

## 2018-04-24 NOTE — Care Management Note (Addendum)
Case Management Note  Patient Details  Name: Joan Elliott MRN: [id number] Date of Birth: 07-10-1940  Subjective/Objective:                    Action/Plan: Patient does not want to go home by PTAR. She feels she is strong enough to go by taxi. SW will provide taxi voucher . Patient will call landlord to unlock her door. Dorian Pod with [facility] aware.   Patient requesting to go home at discharge with home health through [facility] Dorian Pod with [facility] aware and has been following). Called James with [facility] for 3 in 1 .   Patient does not have  transportation home . Confirmed face sheet address. If she is unable to travel by taxi will arrange PTAR. PTAR paperwork in shadow chart in case needed. Expected Discharge Date:                  Expected Discharge Plan:  [place]  In-House Referral:  NA  Discharge planning Services  CM Consult  Post Acute Care Choice:  Durable Medical Equipment, Home Health Choice offered to:  Patient  DME Arranged:  3-N-1 DME Agency:  [place]:  RN, PT, OT, Nurse's Aide [place] Agency:  [facility]  Status of Service:  Completed, signed off  If discussed at [place] of Stay Meetings, dates discussed:    Additional Comments:  Marilu Favre, RN 04/24/2018, 1:53 PM

## 2018-04-24 NOTE — Discharge Instructions (Signed)
[facility]      [facility] °[phone] ° °OPEN ABDOMINAL SURGERY: POST OP INSTRUCTIONS ° °Always review your discharge instruction sheet given to you by the facility where your surgery was performed. ° °IF YOU HAVE DISABILITY OR FAMILY LEAVE FORMS, YOU MUST BRING THEM TO THE OFFICE FOR PROCESSING.  PLEASE DO NOT GIVE THEM TO YOUR DOCTOR. ° °1. A prescription for pain medication may be given to you upon discharge.  Take your pain medication as prescribed, if needed.  If narcotic pain medicine is not needed, then you may take acetaminophen (Tylenol) or ibuprofen (Advil) as needed. °2. Take your usually prescribed medications unless otherwise directed. °3. If you need a refill on your pain medication, please contact your pharmacy. They will contact our office to request authorization.  Prescriptions will not be filled after 5pm or on week-ends. °4. You should follow a light diet the first few days after arrival home, such as soup and crackers, pudding, etc.unless your doctor has advised otherwise. A high-fiber, low fat diet can be resumed as tolerated.   Be sure to include lots of fluids daily. Most patients will experience some swelling and bruising on the chest and neck area.  Ice packs will help.  Swelling and bruising can take several days to resolve °5. Most patients will experience some swelling and bruising in the area of the incision. Ice pack will help. Swelling and bruising can take several days to resolve..  °6. It is common to experience some constipation if taking pain medication after surgery.  Increasing fluid intake and taking a stool softener will usually help or prevent this problem from occurring.  A mild laxative (Milk of Magnesia or Miralax) should be taken according to package directions if there are no bowel movements after 48 hours. °7.  You may have steri-strips (small skin tapes) in place directly over the incision.  These strips should be left on the skin for 7-10 days.  If your  surgeon used skin glue on the incision, you may shower in 24 hours.  The glue will flake off over the next 2-3 weeks.  Any sutures or staples will be removed at the office during your follow-up visit. You may find that a light gauze bandage over your incision may keep your staples from being rubbed or pulled. You may shower and replace the bandage daily. °8. ACTIVITIES:  You may resume regular (light) daily activities beginning the next day--such as daily self-care, walking, climbing stairs--gradually increasing activities as tolerated.  You may have sexual intercourse when it is comfortable.  Refrain from any heavy lifting or straining until approved by your doctor. °a. You may drive when you no longer are taking prescription pain medication, you can comfortably wear a seatbelt, and you can safely maneuver your car and apply brakes °b. Return to Work: ___________________________________ °9. You should see your doctor in the office for a follow-up appointment approximately two weeks after your surgery.  Make sure that you call for this appointment within a day or two after you arrive home to insure a convenient appointment time. °OTHER INSTRUCTIONS:  °_____________________________________________________________ °_____________________________________________________________ ° °WHEN TO CALL YOUR DOCTOR: °1. Fever over 101.0 °2. Inability to urinate °3. Nausea and/or vomiting °4. Extreme swelling or bruising °5. Continued bleeding from incision. °6. Increased pain, redness, or drainage from the incision. °7. Difficulty swallowing or breathing °8. Muscle cramping or spasms. °9. Numbness or tingling in hands or feet or around lips. ° °The clinic staff is available to   answer your questions during regular business hours.  Please dont hesitate to call and ask to speak to one of the nurses if you have concerns.  For further questions, please visit [email or web address]    Additional discharge  instructions Please get your medications reviewed and adjusted by your Primary MD.  Please request your Primary MD to go over all Hospital Tests and Procedure/Radiological results at the follow up, please get all Hospital records sent to your Prim MD by signing hospital release before you go home.  If you had Pneumonia of Lung problems at the Hospital: Please get a 2 view Chest X ray done in 6-8 weeks after hospital discharge or sooner if instructed by your Primary MD.  If you have Congestive Heart Failure: Please call your Cardiologist or Primary MD anytime you have any of the following symptoms:  1) 3 pound weight gain in 24 hours or 5 pounds in 1 week  2) shortness of breath, with or without a dry hacking cough  3) swelling in the hands, feet or stomach  4) if you have to sleep on extra pillows at night in order to breathe  Follow cardiac low salt diet and 1.5 lit/day fluid restriction.  If you have diabetes Accuchecks 4 times/day, Once in AM empty stomach and then before each meal. Log in all results and show them to your primary doctor at your next visit. If any glucose reading is under 80 or above 300 call your primary MD immediately.  If you have Seizure/Convulsions/Epilepsy: Please do not drive, operate heavy machinery, participate in activities at heights or participate in high speed sports until you have seen by Primary MD or a Neurologist and advised to do so again.  If you had Gastrointestinal Bleeding: Please ask your Primary MD to check a complete blood count within one week of discharge or at your next visit. Your endoscopic/colonoscopic biopsies that are pending at the time of discharge, will also need to followed by your Primary MD.  Get Medicines reviewed and adjusted. Please take all your medications with you for your next visit with your Primary MD  Please request your Primary MD to go over all hospital tests and procedure/radiological results at the follow up,  please ask your Primary MD to get all Hospital records sent to his/her office.  If you experience worsening of your admission symptoms, develop shortness of breath, life threatening emergency, suicidal or homicidal thoughts you must seek medical attention immediately by calling 911 or calling your MD immediately  if symptoms less severe.  You must read complete instructions/literature along with all the possible adverse reactions/side effects for all the Medicines you take and that have been prescribed to you. Take any new Medicines after you have completely understood and accpet all the possible adverse reactions/side effects.   Do not drive or operate heavy machinery when taking Pain medications.   Do not take more than prescribed Pain, Sleep and Anxiety Medications  Special Instructions: If you have smoked or chewed Tobacco  in the last 2 yrs please stop smoking, stop any regular Alcohol  and or any Recreational drug use.  Wear Seat belts while driving.  Please note You were cared for by a hospitalist during your hospital stay. If you have any questions about your discharge medications or the care you received while you were in the hospital after you are discharged, you can call the unit and asked to speak with the hospitalist on call if the hospitalist that took care of  you is not available. Once you are discharged, your primary care physician will handle any further medical issues. Please note that NO REFILLS for any discharge medications will be authorized once you are discharged, as it is imperative that you return to your primary care physician (or establish a relationship with a primary care physician if you do not have one) for your aftercare needs so that they can reassess your need for medications and monitor your lab values.  You can reach the hospitalist office at phone [phone] or fax [phone]   If you do not have a primary care physician, you can call [phone] for a  physician referral.

## 2018-05-09 ENCOUNTER — Other Ambulatory Visit: Payer: Self-pay | Admitting: General Surgery

## 2018-09-07 ENCOUNTER — Telehealth (INDEPENDENT_AMBULATORY_CARE_PROVIDER_SITE_OTHER): Payer: Self-pay | Admitting: Physical Medicine and Rehabilitation

## 2018-09-10 NOTE — Telephone Encounter (Signed)
Auth No / Request ID [id number] Status Auto-Approved Decision Approved Effective Date 09/10/2018 Expiration Date 10/25/2018

## 2018-09-10 NOTE — Telephone Encounter (Signed)
Pt scheduled 09/27/2018 with driver.

## 2018-09-10 NOTE — Telephone Encounter (Signed)
ok 

## 2018-09-10 NOTE — Telephone Encounter (Signed)
Needs auth and scheduling for bilateral [phone].

## 2018-09-27 ENCOUNTER — Encounter (INDEPENDENT_AMBULATORY_CARE_PROVIDER_SITE_OTHER): Payer: Self-pay | Admitting: Physical Medicine and Rehabilitation

## 2018-10-04 ENCOUNTER — Telehealth (INDEPENDENT_AMBULATORY_CARE_PROVIDER_SITE_OTHER): Payer: Self-pay | Admitting: *Deleted

## 2018-10-04 NOTE — Telephone Encounter (Signed)
Automatic Approval via [employer]et for [id number] Auth No / Request ID [id number] Status Auto-Approved Decision Approved Effective Date 11/12/2018 Expiration Date 02/10/2019

## 2018-10-18 ENCOUNTER — Encounter (INDEPENDENT_AMBULATORY_CARE_PROVIDER_SITE_OTHER): Payer: Self-pay | Admitting: Physical Medicine and Rehabilitation

## 2018-12-03 ENCOUNTER — Encounter: Payer: Self-pay | Admitting: Physical Medicine and Rehabilitation

## 2018-12-13 ENCOUNTER — Encounter: Payer: Self-pay | Admitting: Physical Medicine and Rehabilitation

## 2018-12-13 ENCOUNTER — Ambulatory Visit: Payer: Self-pay

## 2018-12-13 ENCOUNTER — Ambulatory Visit (INDEPENDENT_AMBULATORY_CARE_PROVIDER_SITE_OTHER): Payer: Medicare HMO | Admitting: Physical Medicine and Rehabilitation

## 2018-12-13 ENCOUNTER — Other Ambulatory Visit: Payer: Self-pay

## 2018-12-13 VITALS — BP 109/61 | HR 80

## 2018-12-13 DIAGNOSIS — M5416 Radiculopathy, lumbar region: Secondary | ICD-10-CM | POA: Diagnosis not present

## 2018-12-13 MED ORDER — BETAMETHASONE SOD PHOS & ACET 6 (3-3) MG/ML IJ SUSP
12.0000 mg | Freq: Once | INTRAMUSCULAR | Status: AC
  Administered 2018-12-13: 12 mg

## 2018-12-13 NOTE — Progress Notes (Signed)
 .  Numeric Pain Rating Scale and Functional Assessment Average Pain 7   In the last MONTH (on 0-10 scale) has pain interfered with the following?  1. General activity like being  able to carry out your everyday physical activities such as walking, climbing stairs, carrying groceries, or moving a chair?  Rating(6)   +Driver, +BT(plavix, ok for inj), -Dye Allergies.

## 2018-12-24 ENCOUNTER — Telehealth: Payer: Self-pay | Admitting: *Deleted

## 2018-12-24 NOTE — Telephone Encounter (Signed)
A message was left, re: follow up visit. 

## 2018-12-31 NOTE — Procedures (Signed)
Lumbosacral Transforaminal Epidural Steroid Injection - Sub-Pedicular Approach with Fluoroscopic Guidance  Patient: Joan Elliott      Date of Birth: [birth year] MRN: [id number] PCP: [name], MD      Visit Date: 12/13/2018   Universal Protocol:    Date/Time: 12/13/2018  Consent Given By: the patient  Position: PRONE  Additional Comments: Vital signs were monitored before and after the procedure. Patient was prepped and draped in the usual sterile fashion. The correct patient, procedure, and site was verified.   Injection Procedure Details:  Procedure Site One Meds Administered:  Meds ordered this encounter  Medications  . betamethasone acetate-betamethasone sodium phosphate (CELESTONE) injection 12 mg    Laterality: Bilateral  Location/Site:  L4-L5  Needle size: 22 G  Needle type: Spinal  Needle Placement: Transforaminal  Findings:    -Comments: Excellent flow of contrast along the nerve and into the epidural space.  Procedure Details: After squaring off the end-plates to get a true AP view, the C-arm was positioned so that an oblique view of the foramen as noted above was visualized. The target area is just inferior to the "nose of the scotty dog" or sub pedicular. The soft tissues overlying this structure were infiltrated with 2-3 ml. of 1% Lidocaine without Epinephrine.  The spinal needle was inserted toward the target using a "trajectory" view along the fluoroscope beam.  Under AP and lateral visualization, the needle was advanced so it did not puncture dura and was located close the 6 O'Clock position of the pedical in AP tracterory. Biplanar projections were used to confirm position. Aspiration was confirmed to be negative for CSF and/or blood. A 1-2 ml. volume of Isovue-250 was injected and flow of contrast was noted at each level. Radiographs were obtained for documentation purposes.   After attaining the desired flow of contrast documented above, a 0.5  to 1.0 ml test dose of 0.25% Marcaine was injected into each respective transforaminal space.  The patient was observed for 90 seconds post injection.  After no sensory deficits were reported, and normal lower extremity motor function was noted,   the above injectate was administered so that equal amounts of the injectate were placed at each foramen (level) into the transforaminal epidural space.   Additional Comments:  The patient tolerated the procedure well Dressing: 2 x 2 sterile gauze and Band-Aid    Post-procedure details: Patient was observed during the procedure. Post-procedure instructions were reviewed.  Patient left the clinic in stable condition.

## 2018-12-31 NOTE — Progress Notes (Signed)
Joan Elliott - 78 y.o. female MRN [id number]  Date of birth: 25-Jan-1941  Office Visit Note: Visit Date: 12/13/2018 PCP: [name], MD Referred by: [name], MD  Subjective: Chief Complaint  Patient presents with  . Lower Back - Pain  . Right Leg - Pain  . Left Leg - Pain   HPI: Joan Elliott is a 78 y.o. female who comes in today For planned repeat bilateral L4 transforaminal epidural steroid injection.  Prior injection last year was beneficial for her chronic worsening low back and bilateral radicular leg pain.  She is followed by Dr. 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  Patient reports good relief until she actually had to be hospitalized in October for perforated appendix which she underwent surgery and has had pain since that time.  Pain is very similar to before no new injuries.  We will repeat the injection she will follow-up with Dr. Louanne Skye.  ROS Otherwise per HPI.  Assessment & Plan: Visit Diagnoses:  1. Lumbar radiculopathy     Plan: No additional findings.   Meds & Orders:  Meds ordered this encounter  Medications  . betamethasone acetate-betamethasone sodium phosphate (CELESTONE) injection 12 mg    Orders Placed This Encounter  Procedures  . XR C-ARM NO REPORT  . Epidural Steroid injection    Follow-up: Return if symptoms worsen or fail to improve.   Procedures: No procedures performed  Lumbosacral Transforaminal Epidural Steroid Injection - Sub-Pedicular Approach with Fluoroscopic Guidance  Patient: Joan Elliott      Date of Birth: 12/31/1940 MRN: [id number] PCP: [name], MD      Visit Date: 12/13/2018   Universal Protocol:    Date/Time: 12/13/2018  Consent Given By: the patient  Position: PRONE  Additional Comments: Vital signs were monitored before and after the procedure. Patient was prepped and draped in the usual sterile fashion. The correct patient, procedure, and site was verified.    Injection Procedure Details:  Procedure Site One Meds Administered:  Meds ordered this encounter  Medications  . betamethasone acetate-betamethasone sodium phosphate (CELESTONE) injection 12 mg    Laterality: Bilateral  Location/Site:  L4-L5  Needle size: 22 G  Needle type: Spinal  Needle Placement: Transforaminal  Findings:    -Comments: Excellent flow of contrast along the nerve and into the epidural space.  Procedure Details: After squaring off the end-plates to get a true AP view, the C-arm was positioned so that an oblique view of the foramen as noted above was visualized. The target area is just inferior to the "nose of the scotty dog" or sub pedicular. The soft tissues overlying this structure were infiltrated with 2-3 ml. of 1% Lidocaine without Epinephrine.  The spinal needle was inserted toward the target using a "trajectory" view along the fluoroscope beam.  Under AP and lateral visualization, the needle was advanced so it did not puncture dura and was located close the 6 O'Clock position of the pedical in AP tracterory. Biplanar projections were used to confirm position. Aspiration was confirmed to be negative for CSF and/or blood. A 1-2 ml. volume of Isovue-250 was injected and flow of contrast was noted at each level. Radiographs were obtained for documentation purposes.   After attaining the desired flow of contrast documented above, a 0.5 to 1.0 ml test dose of 0.25% Marcaine was injected into each respective transforaminal space.  The patient was observed for 90 seconds post injection.  After no sensory  deficits were reported, and normal lower extremity motor function was noted,   the above injectate was administered so that equal amounts of the injectate were placed at each foramen (level) into the transforaminal epidural space.   Additional Comments:  The patient tolerated the procedure well Dressing: 2 x 2 sterile gauze and Band-Aid    Post-procedure  details: Patient was observed during the procedure. Post-procedure instructions were reviewed.  Patient left the clinic in stable condition.     Clinical History: MRI LUMBAR SPINE WITHOUT CONTRAST  TECHNIQUE: Multiplanar, multisequence MR imaging of the lumbar spine was performed. No intravenous contrast was administered.  COMPARISON:  Radiography 04/24/2017  FINDINGS: Segmentation:  5 lumbar type vertebral bodies.  Alignment:  5 mm anterolisthesis L4-5.  Vertebrae:  Normal  Conus medullaris and cauda equina: Conus extends to the lower L2 level. Conus and cauda equina appear normal.  Paraspinal and other soft tissues: Negative  Disc levels:  Minimal, non-compressive disc bulges at L2-3 and above.  L3-4: Mild bulging of the disc. Facet and ligamentous hypertrophy. Mild to moderate multifactorial stenosis.  L4-5: Bilateral facet arthropathy with 5 mm of anterolisthesis. Mild bulging of the disc. Severe multifactorial stenosis likely to cause neural compression.  L5-S1: Bulging of the disc more towards the left. Facet hypertrophy. No compressive stenosis.  IMPRESSION: At L4-5, there is severe multifactorial spinal stenosis secondary to hypertrophic facet arthropathy which allows anterolisthesis of 5 mm, in combination with the bulging disc. Neural compression quite likely at this level.  L3-4 shows mild to moderate multifactorial stenosis secondary to facet degeneration and hypertrophy combination with mild bulging of the disc.   Electronically Signed   By: Nelson Chimes M.D.   On: 09/29/2017 13:05   She reports that she has been smoking cigarettes. She has been smoking about 0.50 packs per day. She has never used smokeless tobacco. No results for input(s): HGBA1C, LABURIC in the last 8760 hours.  Objective:  VS:  HT:    WT:   BMI:     BP:109/61  HR:80bpm  TEMP: ( )  RESP:  Physical Exam  Ortho Exam Imaging: No results found.  Past  Medical/Family/Surgical/Social History: Medications & Allergies reviewed per EMR, new medications updated. Patient Active Problem List   Diagnosis Date Noted  . Ileus following gastrointestinal surgery ([place])   . AKI (acute kidney injury) ([place]) 04/05/2018  . Acute perforated appendicitis 04/05/2018  . HTN (hypertension) 04/05/2018  . Aortic atherosclerosis ([place]) 02/01/2018  . Morbid obesity ([place]) 02/01/2018  . CKD (chronic kidney disease) stage 3, GFR 30-59 ml/min ([facility]) 02/01/2018  . Peripheral vascular disease ([place]) 02/01/2018  . Nonspecific chest pain 02/23/2015  . Hypertensive heart disease without CHF   . Coronary artery disease   . Tobacco abuse   . Hypothyroidism   . HLD (hyperlipidemia)   . Gout   . GERD (gastroesophageal reflux disease)    Past Medical History:  Diagnosis Date  . Aortic atherosclerosis ([place]) 02/01/2018  . Arthritis   . COPD (chronic obstructive pulmonary disease) ([place])   . Coronary artery disease    s/p stent mid RCA 2003  . GERD (gastroesophageal reflux disease)   . Gout   . HLD (hyperlipidemia)   . Hypertension   . Hypertensive heart disease without CHF   . Hypothyroidism   . Morbid obesity ([place]) 02/01/2018  . Peripheral vascular disease ([place]) 02/01/2018  . Stage 4 chronic kidney disease ([place]) 02/01/2018  . Stroke ([place])   . Tobacco abuse  Family History  Problem Relation Age of Onset  . Hypertension Mother   . Kidney disease Mother   . Hypertension Brother   . Hypertension Sister    Past Surgical History:  Procedure Laterality Date  . ABDOMINAL HYSTERECTOMY    . LAPAROSCOPY N/A 04/11/2018   Procedure: LAPAROSCOPY DIAGNOSTIC;  Surgeon: Rolm Bookbinder, MD;  Location: [facility];  Service: General;  Laterality: N/A;  . LAPAROTOMY N/A 04/11/2018   Procedure: EXPLORATORY LAPAROTOMY;  Surgeon: Rolm Bookbinder, MD;  Location: [facility];  Service: General;  Laterality: N/A;  . LYSIS OF ADHESION N/A 04/11/2018   Procedure: LYSIS OF ADHESION;  Surgeon:  Rolm Bookbinder, MD;  Location: [facility];  Service: General;  Laterality: N/A;  . PARTIAL COLECTOMY N/A 04/11/2018   Procedure: COLECTOMY;  Surgeon: Rolm Bookbinder, MD;  Location: [facility];  Service: General;  Laterality: N/A;  . REPLACEMENT TOTAL KNEE Right   . THYROID SURGERY     Social History   Occupational History  . Not on file  Tobacco Use  . Smoking status: Current Some Day Smoker    Packs/day: 0.50    Types: Cigarettes  . Smokeless tobacco: Never Used  Substance and Sexual Activity  . Alcohol use: No  . Drug use: No  . Sexual activity: Not on file

## 2019-03-12 ENCOUNTER — Other Ambulatory Visit: Payer: Self-pay | Admitting: Cardiology

## 2019-04-07 DIAGNOSIS — E785 Hyperlipidemia, unspecified: Secondary | ICD-10-CM | POA: Insufficient documentation

## 2019-04-07 NOTE — Progress Notes (Signed)
Cardiology Office Note   Date:  04/08/2019   ID:  Joan Elliott, Joan Elliott, Joan Elliott, MRN [id number]  PCP:  [name], MD  Cardiologist:   No primary care provider on file.   Chief Complaint  Patient presents with  . Chest Pain      History of Present Illness: Joan Elliott is a 78 y.o. female who presents for follow up of CAD s/p stent mRCA 2003.  She was seen back in 2016 by Dr. Gwenlyn Found for chest pain and ruled out with troponins. She underwent a lexiscan that was normal. Since that time was lost to follow up. She was in the hospital in Oct of last year with chest pain that was thought to be nonanginal.  Echo was unremarkable.     Since I last saw her she continue to get chest discomfort.  She has multiple aches and pains.  She is tender to touch.  Most of the exam on her chest and abdomen and legs.  She complains of chest discomfort under the left breast.  He describes some substernal discomfort.  Hard to sort out whether this was the same as when she had last year which was negative perfusion study.  Some of it is reproducible with palpation.  Otherwise it happens without movement or exertion.  She has chronic shortness of breath.  Not describing any new PND or orthopnea.  She is not describing any new palpitations, presyncope or syncope.   Past Medical History:  Diagnosis Date  . Aortic atherosclerosis ([place]) 02/01/2018  . Arthritis   . COPD (chronic obstructive pulmonary disease) ([place])   . Coronary artery disease    s/p stent mid RCA 2003  . GERD (gastroesophageal reflux disease)   . Gout   . HLD (hyperlipidemia)   . Hypertension   . Hypertensive heart disease without CHF   . Hypothyroidism   . Morbid obesity ([place]) 02/01/2018  . Peripheral vascular disease ([place]) 02/01/2018  . Stage 4 chronic kidney disease ([place]) 02/01/2018  . Stroke ([place])   . Tobacco abuse     Past Surgical History:  Procedure Laterality Date  . ABDOMINAL HYSTERECTOMY    . LAPAROSCOPY N/A 04/11/2018    Procedure: LAPAROSCOPY DIAGNOSTIC;  Surgeon: Rolm Bookbinder, MD;  Location: [facility];  Service: General;  Laterality: N/A;  . LAPAROTOMY N/A 04/11/2018   Procedure: EXPLORATORY LAPAROTOMY;  Surgeon: Rolm Bookbinder, MD;  Location: [facility];  Service: General;  Laterality: N/A;  . LYSIS OF ADHESION N/A 04/11/2018   Procedure: LYSIS OF ADHESION;  Surgeon: Rolm Bookbinder, MD;  Location: [facility];  Service: General;  Laterality: N/A;  . PARTIAL COLECTOMY N/A 04/11/2018   Procedure: COLECTOMY;  Surgeon: Rolm Bookbinder, MD;  Location: [facility];  Service: General;  Laterality: N/A;  . REPLACEMENT TOTAL KNEE Right   . THYROID SURGERY       Current Outpatient Medications  Medication Sig Dispense Refill  . acetaminophen (TYLENOL) 325 MG tablet Take 2 tablets (650 mg total) by mouth every 6 (six) hours as needed for mild pain or fever.    Marland Kitchen allopurinol (ZYLOPRIM) 100 MG tablet Take 1 tablet (100 mg total) by mouth daily. 30 tablet 1  . clopidogrel (PLAVIX) 75 MG tablet Take 1 tablet (75 mg total) by mouth daily. 30 tablet 12  . diclofenac sodium (VOLTAREN) 1 % GEL Apply 4 g topically 4 (four) times daily as needed for pain.  5  . docusate sodium (COLACE) 100 MG capsule Take 1 capsule (  100 mg total) by mouth 2 (two) times daily. 10 capsule 0  . gabapentin (NEURONTIN) 100 MG capsule TAKE 1 CAPSULE(100 MG) BY MOUTH AT BEDTIME (Patient taking differently: Take 100 mg by mouth at bedtime. ) 30 capsule 1  . levothyroxine (SYNTHROID, LEVOTHROID) 50 MCG tablet Take 1 tablet (50 mcg total) by mouth daily. 30 tablet 1  . metoprolol tartrate (LOPRESSOR) 25 MG tablet Take 0.5 tablets (12.5 mg total) by mouth 2 (two) times daily. 60 tablet 0  . Multiple Vitamin (MULTIVITAMIN WITH MINERALS) TABS tablet Take 1 tablet by mouth daily.    Marland Kitchen oxybutynin (DITROPAN) 5 MG tablet Take 1 tablet (5 mg total) by mouth 2 (two) times daily. 30 tablet 1  . pantoprazole (PROTONIX) 40 MG tablet Take 1 tablet (40 mg total) by mouth 2  (two) times daily. 60 tablet 3  . tiZANidine (ZANAFLEX) 4 MG tablet Take 4 mg by mouth 2 (two) times daily as needed for muscle spasms.    . traMADol (ULTRAM) 50 MG tablet Take 1 tablet (50 mg total) by mouth every 6 (six) hours as needed for moderate pain or severe pain. 20 tablet 0  . isosorbide mononitrate (IMDUR) 30 MG 24 hr tablet Take 1 tablet (30 mg total) by mouth daily. 90 tablet 3  . nitroGLYCERIN (NITROSTAT) 0.4 MG SL tablet Place 1 tablet (0.4 mg total) under the tongue every 5 (five) minutes as needed for chest pain. 90 tablet 3  . rosuvastatin (CRESTOR) 40 MG tablet Take 1 tablet (40 mg total) by mouth daily. 90 tablet 3   No current facility-administered medications for this visit.     Allergies:   Ivp dye [iodinated diagnostic agents], Oxycodone, and Penicillins    ROS:  Please see the history of present illness.   Otherwise, review of systems are positive for joint pains, knee pains, burning and tingling in her feet, back problems.   All other systems are reviewed and negative.    PHYSICAL EXAM: VS:  BP 126/76   Pulse 74   Temp (!) 95.5 F (35.3 C)   Ht 5' (1.524 m)   Wt 192 lb (87.1 kg)   SpO2 99%   BMI 37.50 kg/m  , BMI Body mass index is 37.5 kg/m.  GENERAL:  Well appearing NECK:  No jugular venous distention, waveform within normal limits, carotid upstroke brisk and symmetric, no bruits, no thyromegaly LUNGS:  Clear to auscultation bilaterally CHEST:  Unremarkable HEART:  PMI not displaced or sustained,S1 and S2 within normal limits, no S3, no S4, no clicks, no rubs, no murmurs ABD:  Flat, positive bowel sounds normal in frequency in pitch, no bruits, no rebound, no guarding, no midline pulsatile mass, no hepatomegaly, no splenomegaly EXT:  2 plus pulses throughout, no edema, no cyanosis no clubbing   EKG:  EKG is ordered today. Sinus rhythm, rate 65, left bundle branch block, left axis deviation, no significant change from previous.  Recent Labs:  04/23/2018: ALT 30; BUN 14; Creatinine, Ser 1.Elliott; Magnesium 2.0; Potassium 4.8; Sodium 140 04/24/2018: Hemoglobin 8.8; Platelets 461    Lipid Panel    Component Value Date/Time   CHOL 188 02/02/2018 0410   TRIG 174 (H) 04/23/2018 0358   HDL 66 02/02/2018 0410   CHOLHDL 2.8 02/02/2018 0410   VLDL Elliott 02/02/2018 0410   LDLCALC 109 (H) 02/02/2018 0410      Wt Readings from Last 3 Encounters:  04/08/19 192 lb (87.1 kg)  04/16/18 205 lb 14.6 oz (93.4 kg)  03/23/18 204 lb (92.5 kg)      Other studies Reviewed: Additional studies/ records that were reviewed today include:  Labs. Review of the above records demonstrates:  Please see elsewhere in the note.     ASSESSMENT AND PLAN:  CAD:    She has some atypical chest pain.  Is very difficult to sort out some of her symptoms.  I am to start Imdur 30 mg daily.  She will be given sublingual nitroglycerin.  We can bring her back and see if her symptoms have progressed but I am not wanting to do another imaging test at this point as her symptoms are atypical and not appreciably different than last year.   HTN:  The blood pressure is at target.  No change in therapy.   DYSLIPIDEMIA:  LDL was not at target.  I will stop Lipitor and start Crestor 40 mg daily with a lipid profile liver enzymes in about 10 weeks.  CKD IV:    Her creat is actually improved as above.  No change in therapy.   TOBACCO ABUSE:   She will stop smoking because it helps her stress.  We talked about this.  Current medicines are reviewed at length with the patient today.  The patient does not have concerns regarding medicines.  The following changes have been made:  As above  Labs/ tests ordered today include:   Orders Placed This Encounter  Procedures  . Lipid panel  . Hepatic function panel  . EKG 12-Lead     Disposition:   FU with APP in 4 months.   Signed, Minus Breeding, MD  04/08/2019 12:04 PM    [place]

## 2019-04-08 ENCOUNTER — Ambulatory Visit (INDEPENDENT_AMBULATORY_CARE_PROVIDER_SITE_OTHER): Payer: Medicare HMO | Admitting: Cardiology

## 2019-04-08 ENCOUNTER — Other Ambulatory Visit: Payer: Self-pay

## 2019-04-08 ENCOUNTER — Encounter: Payer: Self-pay | Admitting: Cardiology

## 2019-04-08 VITALS — BP 126/76 | HR 74 | Temp 95.5°F | Ht 60.0 in | Wt 192.0 lb

## 2019-04-08 DIAGNOSIS — I2510 Atherosclerotic heart disease of native coronary artery without angina pectoris: Secondary | ICD-10-CM

## 2019-04-08 DIAGNOSIS — E785 Hyperlipidemia, unspecified: Secondary | ICD-10-CM | POA: Diagnosis not present

## 2019-04-08 DIAGNOSIS — I10 Essential (primary) hypertension: Secondary | ICD-10-CM

## 2019-04-08 DIAGNOSIS — Z720 Tobacco use: Secondary | ICD-10-CM | POA: Diagnosis not present

## 2019-04-08 MED ORDER — ROSUVASTATIN CALCIUM 40 MG PO TABS: 40.0000 mg | ORAL_TABLET | Freq: Every day | ORAL | 3 refills | Status: DC

## 2019-04-08 MED ORDER — NITROGLYCERIN 0.4 MG SL SUBL: 0.4000 mg | SUBLINGUAL_TABLET | SUBLINGUAL | 3 refills | Status: DC | PRN

## 2019-04-08 MED ORDER — ISOSORBIDE MONONITRATE ER 30 MG PO TB24: 30.0000 mg | ORAL_TABLET | Freq: Every day | ORAL | 3 refills | Status: DC

## 2019-04-08 NOTE — Patient Instructions (Addendum)
Medication Instructions:  Stop taking lipitor. Start taking Sublingual Nitroglycerin as needed. Start taking Imdur 30mg  daily. Start taking Crestor 40mg  daily.   If you need a refill on your cardiac medications before your next appointment, please call your pharmacy.   Lab work: Lipids and Hepatic funtion in 10 WEEKS. If you have labs (blood work) drawn today and your tests are completely normal, you will receive your results only by: [facility] (if you have MyChart) OR A paper copy in the mail If you have any lab test that is abnormal or we need to change your treatment, we will call you to review the results.  Testing/Procedures: NONE  Follow-Up: At [facility], you and your health needs are our priority.  As part of our continuing mission to provide you with exceptional heart care, we have created designated Provider Care Teams.  These Care Teams include your primary Cardiologist (physician) and Advanced Practice Providers (APPs -  Physician Assistants and Nurse Practitioners) who all work together to provide you with the care you need, when you need it. You will need a follow up appointment in 12 months.  Please call our office 2 months in advance to schedule this appointment.  You may see Dr. Percival Spanish or one of the following Advanced Practice Providers on your designated Care Team:   Rosaria Ferries, PA-C Jory Sims, DNP, ANP  Any Other Special Instructions Will Be Listed Below (If Applicable). Make an appointment with an APP in 4 months.

## 2019-06-10 ENCOUNTER — Other Ambulatory Visit: Payer: Self-pay | Admitting: Cardiology

## 2019-06-19 LAB — HEPATIC FUNCTION PANEL
ALT: 13 IU/L (ref 0–32)
AST: 20 IU/L (ref 0–40)
Albumin: 4.1 g/dL (ref 3.7–4.7)
Alkaline Phosphatase: 110 IU/L (ref 39–117)
Bilirubin Total: 0.2 mg/dL (ref 0.0–1.2)
Bilirubin, Direct: 0.1 mg/dL (ref 0.00–0.40)
Total Protein: 6.5 g/dL (ref 6.0–8.5)

## 2019-06-19 LAB — LIPID PANEL
Chol/HDL Ratio: 1.9 ratio (ref 0.0–4.4)
Cholesterol, Total: 136 mg/dL (ref 100–199)
HDL: 71 mg/dL (ref >39)
LDL Chol Calc (NIH): 45 mg/dL (ref 0–99)
Triglycerides: 113 mg/dL (ref 0–149)
VLDL Cholesterol Cal: 20 mg/dL (ref 5–40)

## 2019-07-10 ENCOUNTER — Other Ambulatory Visit: Payer: Self-pay

## 2019-07-10 MED ORDER — ISOSORBIDE MONONITRATE ER 30 MG PO TB24: 30.0000 mg | ORAL_TABLET | Freq: Every day | ORAL | 3 refills | Status: DC

## 2019-07-10 MED ORDER — ROSUVASTATIN CALCIUM 40 MG PO TABS: 40.0000 mg | ORAL_TABLET | Freq: Every day | ORAL | 3 refills | Status: DC

## 2019-09-12 ENCOUNTER — Inpatient Hospital Stay
Admit: 2019-09-12 | Discharge: 2019-09-12 | Disposition: A | Discharge status: Home / Self Care | Payer: MEDICARE | Attending: Emergency Medicine

## 2019-09-12 ENCOUNTER — Emergency Department: Admit: 2019-09-12 | Payer: MEDICARE | Primary: Student in an Organized Health Care Education/Training Program

## 2019-09-12 DIAGNOSIS — M542 Cervicalgia: Secondary | ICD-10-CM

## 2019-09-12 LAB — CBC WITH AUTO DIFFERENTIAL
Basophils %: 1 % (ref 0–1)
Basophils Absolute: 0 10*3/uL (ref 0.0–0.1)
Eosinophils %: 4 % (ref 0–7)
Eosinophils Absolute: 0.2 10*3/uL (ref 0.0–0.4)
Granulocyte Absolute Count: 0 10*3/uL (ref 0.00–0.04)
Hematocrit: 32.8 % — ABNORMAL LOW (ref 35.0–47.0)
Hemoglobin: 10.7 g/dL — ABNORMAL LOW (ref 11.5–16.0)
Immature Granulocytes: 0 % (ref 0.0–0.5)
Lymphocytes %: 28 % (ref 12–49)
Lymphocytes Absolute: 1.3 10*3/uL (ref 0.8–3.5)
MCH: 26.5 PG (ref 26.0–34.0)
MCHC: 32.6 g/dL (ref 30.0–36.5)
MCV: 81.2 FL (ref 80.0–99.0)
MPV: 9.8 FL (ref 8.9–12.9)
Monocytes %: 8 % (ref 5–13)
Monocytes Absolute: 0.4 10*3/uL (ref 0.0–1.0)
NRBC Absolute: 0 10*3/uL (ref 0.00–0.01)
Neutrophils %: 59 % (ref 32–75)
Neutrophils Absolute: 2.7 10*3/uL (ref 1.8–8.0)
Nucleated RBCs: 0 PER 100 WBC
Platelets: 237 10*3/uL (ref 150–400)
RBC: 4.04 M/uL (ref 3.80–5.20)
RDW: 15.9 % — ABNORMAL HIGH (ref 11.5–14.5)
WBC: 4.5 10*3/uL (ref 3.6–11.0)

## 2019-09-12 LAB — BASIC METABOLIC PANEL
Anion Gap: 12 mmol/L (ref 5–15)
BUN: 14 MG/DL (ref 6–20)
Bun/Cre Ratio: 9 — ABNORMAL LOW (ref 12–20)
CO2: 27 mmol/L (ref 21–32)
Calcium: 8.9 MG/DL (ref 8.5–10.1)
Chloride: 104 mmol/L (ref 97–108)
Creatinine: 1.62 MG/DL — ABNORMAL HIGH (ref 0.55–1.02)
EGFR IF NonAfrican American: 31 mL/min/{1.73_m2} — ABNORMAL LOW (ref >60)
GFR African American: 37 mL/min/{1.73_m2} — ABNORMAL LOW (ref >60)
Glucose: 105 mg/dL — ABNORMAL HIGH (ref 65–100)
Potassium: 3.5 mmol/L (ref 3.5–5.1)
Sodium: 143 mmol/L (ref 136–145)

## 2019-09-12 LAB — TROPONIN: Troponin I: <0.05 ng/mL (ref <0.05)

## 2019-09-12 LAB — METABOLIC PANEL, BASIC
Anion gap: 12 mmol/L (ref 5–15)
BUN/Creatinine ratio: 9 — ABNORMAL LOW (ref 12–20)
BUN: 14 MG/DL (ref 6–20)
CO2: 27 mmol/L (ref 21–32)
Calcium: 8.9 MG/DL (ref 8.5–10.1)
Chloride: 104 mmol/L (ref 97–108)
Creatinine: 1.62 MG/DL — ABNORMAL HIGH (ref 0.55–1.02)
GFR est AA: 37 mL/min/{1.73_m2} — ABNORMAL LOW (ref >60)
GFR est non-AA: 31 mL/min/{1.73_m2} — ABNORMAL LOW (ref >60)
Glucose: 105 mg/dL — ABNORMAL HIGH (ref 65–100)
Potassium: 3.5 mmol/L (ref 3.5–5.1)
Sodium: 143 mmol/L (ref 136–145)

## 2019-09-12 LAB — CBC WITH AUTOMATED DIFF
ABS. BASOPHILS: 0 10*3/uL (ref 0.0–0.1)
ABS. EOSINOPHILS: 0.2 10*3/uL (ref 0.0–0.4)
ABS. IMM. GRANS.: 0 10*3/uL (ref 0.00–0.04)
ABS. LYMPHOCYTES: 1.3 10*3/uL (ref 0.8–3.5)
ABS. MONOCYTES: 0.4 10*3/uL (ref 0.0–1.0)
ABS. NEUTROPHILS: 2.7 10*3/uL (ref 1.8–8.0)
ABSOLUTE NRBC: 0 10*3/uL (ref 0.00–0.01)
BASOPHILS: 1 % (ref 0–1)
EOSINOPHILS: 4 % (ref 0–7)
HCT: 32.8 % — ABNORMAL LOW (ref 35.0–47.0)
HGB: 10.7 g/dL — ABNORMAL LOW (ref 11.5–16.0)
IMMATURE GRANULOCYTES: 0 % (ref 0.0–0.5)
LYMPHOCYTES: 28 % (ref 12–49)
MCH: 26.5 PG (ref 26.0–34.0)
MCHC: 32.6 g/dL (ref 30.0–36.5)
MCV: 81.2 FL (ref 80.0–99.0)
MONOCYTES: 8 % (ref 5–13)
MPV: 9.8 FL (ref 8.9–12.9)
NEUTROPHILS: 59 % (ref 32–75)
NRBC: 0 PER 100 WBC
PLATELET: 237 10*3/uL (ref 150–400)
RBC: 4.04 M/uL (ref 3.80–5.20)
RDW: 15.9 % — ABNORMAL HIGH (ref 11.5–14.5)
WBC: 4.5 10*3/uL (ref 3.6–11.0)

## 2019-09-12 LAB — SAMPLES BEING HELD

## 2019-09-12 LAB — TROPONIN I: Troponin-I, Qt.: <0.05 ng/mL (ref <0.05)

## 2019-09-12 MED ORDER — ASPIRIN 81 MG CHEWABLE TAB
81 mg | ORAL | Status: AC
Start: 2019-09-12 — End: 2019-09-12
  Administered 2019-09-12: 17:00:00 via ORAL

## 2019-09-12 MED ORDER — ACETAMINOPHEN 500 MG TAB
500 mg | ORAL | Status: AC
Start: 2019-09-12 — End: 2019-09-12
  Administered 2019-09-12: 17:00:00 via ORAL

## 2019-09-12 MED ORDER — TRAMADOL 50 MG TAB
50 mg | ORAL_TABLET | Freq: Four times a day (QID) | ORAL | 0 refills | Status: DC | PRN
Start: 2019-09-12 — End: 2019-09-12

## 2019-09-12 MED ORDER — METHOCARBAMOL 750 MG TAB
750 mg | ORAL_TABLET | Freq: Three times a day (TID) | ORAL | 0 refills | Status: AC | PRN
Start: 2019-09-12 — End: 2021-07-01

## 2019-09-12 MED ORDER — TRAMADOL 50 MG TAB
50 mg | ORAL | Status: AC
Start: 2019-09-12 — End: 2019-09-12
  Administered 2019-09-12: 17:00:00 via ORAL

## 2019-09-12 MED ORDER — TRAMADOL 50 MG TAB
50 mg | ORAL_TABLET | Freq: Four times a day (QID) | ORAL | 0 refills | Status: AC | PRN
Start: 2019-09-12 — End: 2019-09-16

## 2019-09-12 MED ORDER — DICLOFENAC 1 % TOPICAL GEL
1 % | Freq: Four times a day (QID) | CUTANEOUS | 0 refills | Status: AC
Start: 2019-09-12 — End: 2021-07-06

## 2019-09-12 MED FILL — ASPIRIN 81 MG CHEWABLE TAB: 81 mg | ORAL | Qty: 4

## 2019-09-12 MED FILL — TRAMADOL 50 MG TAB: 50 mg | ORAL | Qty: 1

## 2019-09-12 MED FILL — ACETAMINOPHEN 500 MG TAB: 500 mg | ORAL | Qty: 2

## 2019-09-12 NOTE — ED Notes (Signed)
I have reviewed discharge instructions with the patient. The patient verbalized understanding. Discharge medications discussed with patient. No questions at this time. Ambulated without difficulty.

## 2019-09-12 NOTE — ED Provider Notes (Signed)
ED  Provider Notes by Marylu Lund, DO at 09/12/19 1213                Author: Marylu Lund, DO  Service: Emergency Medicine  Author Type: Physician       Filed: 09/12/19 1451  Date of Service: 09/12/19 1213  Status: Signed          Editor: Marylu Lund, DO (Physician)               EMERGENCY DEPARTMENT HISTORY AND PHYSICAL EXAM           Date: 09/12/2019   Patient Name: Tanya [name]      Please note that this dictation was completed with Dragon, the computer voice recognition software.  Quite often unanticipated grammatical, syntax, homophones, and other interpretive errors are inadvertently transcribed by the computer software.  Please  disregard these errors.  Please excuse any errors that have escaped final proofreading.        History of Presenting Illness          Chief Complaint       Patient presents with        ?  Neck Pain           History Provided By: Patient       HPI: Tanya [name],  79 y.o. female, presenting the emergency department complaining of neck pain.  Patient has  a history of coronary artery disease, hypertension, chronic pain.  She is up visiting from [place].  She states she has had neck pain for the past 2 days, it is bilateral, worse on the right and radiates into her chest.  Its worse with movement,  was relieved mildly by tramadol, but she just ran out of her tramadol.  Denies any new or worsening shortness of breath, reports she feels short of breath at baseline which is chronic for her.  Denies any unilateral leg pain or leg swelling.  No weakness  in the upper extremities, but reports intermittent numbness and tingling in the bilateral upper extremities.  Pain is worse with movement, range of motion is limited.  Nothing makes her symptoms better other than what is described above.      PCP: [facility], Not On File        No current facility-administered medications on file prior to encounter.           Current Outpatient Medications on File Prior to Encounter           Medication  Sig  Dispense  Refill           ?  metoprolol tartrate (LOPRESSOR) 25 mg tablet  Take 12.5 mg by mouth two (2) times a day.         ?  levocetirizine (XYZAL) 5 mg tablet  Take  by mouth.         ?  traMADoL (ULTRAM) 50 mg tablet  Take 50 mg by mouth every six (6) hours as needed for Pain.         ?  diphenoxylate-atropine (LomotiL) 2.5-0.025 mg per tablet  Take  by mouth four (4) times daily as needed for Diarrhea.         ?  ergocalciferol (Vitamin D2) 1,250 mcg (50,000 unit) capsule  Take 50,000 Units by mouth.         ?  rosuvastatin (CRESTOR) 40 mg tablet  Take 40 mg by mouth nightly.         ?  isosorbide mononitrate ER (IMDUR) 30 mg tablet  Take 30 mg by mouth daily.         ?  LEVOTHYROXINE SODIUM (LEVOTHROID PO)  Take 50 mg by mouth.         ?  clopidogrel (PLAVIX) 75 mg tablet  Take  by mouth daily.         ?  allopurinol (ZYLOPRIM) 100 mg tablet  Take 100 mg by mouth daily.         ?  lisinopril (PRINIVIL, ZESTRIL) 40 mg tablet  Take 40 mg by mouth daily.         ?  Arformoterol (BROVANA) 15 mcg/2 mL nebu neb solution  15 mcg by Nebulization route two (2) times daily as needed.         ?  HYDROcodone-acetaminophen (NORCO) 5-325 mg per tablet  Take 1 Tab by mouth every four (4) hours as needed.  10 Tab  0     ?  lovastatin (MEVACOR) 40 mg tablet  Take 40 mg by mouth nightly.         ?  colchicine (COLCRYS) 0.6 mg tablet  Take 0.6 mg by mouth daily.         ?  fluticasone (VERAMYST) 27.5 mcg/actuation nasal spray  2 Sprays by Nasal route two (2) times daily as needed for Rhinitis.               ?  [DISCONTINUED] diclofenac EC (VOLTAREN) 25 mg EC tablet  Take 25 mg by mouth daily.                 Past History        Past Medical History:     Past Medical History:        Diagnosis  Date         ?  Acid reflux       ?  Asthma       ?  CAD (coronary artery disease)           ?  Hypertension             Past Surgical History:     Past Surgical History:         Procedure  Laterality  Date          ?   HX ORTHOPAEDIC              right knee replacement          ?  PR CARDIAC SURG PROCEDURE UNLIST              Stents           Family History:   History reviewed. No pertinent family history.      Social History:     Social History          Tobacco Use         ?  Smoking status:  Current Every Day Smoker              Packs/day:  0.25         Years:  50.00         Pack years:  12.50         ?  Smokeless tobacco:  Never Used       Substance Use Topics         ?  Alcohol use:  Yes  Alcohol/week:  2.5 standard drinks         Types:  3 Cans of beer per week             Comment: 2-3 beers on weekend         ?  Drug use:  Never           Allergies:     Allergies        Allergen  Reactions         ?  Iodinated Contrast Media  Unknown (comments)         ?  Pcn [Penicillins]  Hives                Review of Systems     Review of Systems    Constitutional: Negative for chills and fever.    HENT: Negative for congestion and sore throat.     Eyes: Negative for visual disturbance.    Respiratory: Negative for cough and shortness of breath.     Cardiovascular: Positive for chest pain. Negative for leg swelling.    Gastrointestinal: Negative for abdominal pain, blood in stool, diarrhea and nausea.    Endocrine: Negative for polyuria.    Genitourinary: Negative for dysuria, flank pain, vaginal bleeding and vaginal discharge.    Musculoskeletal: Positive for neck pain and neck stiffness . Negative for myalgias.    Skin: Negative for rash.    Allergic/Immunologic: Negative for immunocompromised state.    Neurological: Negative for weakness and headaches.    Psychiatric/Behavioral: Negative for confusion.            Physical Exam     Physical Exam   Vitals signs and nursing note reviewed.   Constitutional:        Appearance: She is well-developed. She is obese.   HENT:       Head: Normocephalic and atraumatic.   Eyes :       General:         Right eye: No discharge.         Left eye: No discharge.      Conjunctiva/sclera:  Conjunctivae normal.      Pupils: Pupils are equal, round, and reactive to light.   Neck:       Musculoskeletal: Normal range of motion and neck supple.      Trachea: No tracheal deviation.          Comments: No midline point tenderness or step-off, range of motion limited secondary to pain.  No masses palpable.  Cardiovascular:       Rate and Rhythm: Normal rate and regular rhythm.      Heart sounds: Normal heart sounds. No murmur.    Pulmonary:       Effort: Pulmonary effort is normal. No respiratory distress.      Breath sounds: Normal breath sounds. No wheezing or rales.    Abdominal:      General: Bowel sounds are normal.      Palpations: Abdomen is soft.      Tenderness: There is no abdominal tenderness. There is no guarding or rebound.     Musculoskeletal: Normal range of motion.          General: No tenderness or deformity.    Skin:      General: Skin is warm and dry.      Findings: No erythema or rash.    Neurological:       Mental Status: She is alert and oriented to  person, place, and time.    Psychiatric:         Behavior: Behavior normal.               Diagnostic Study Results        Labs -         Recent Results (from the past 12 hour(s))     EKG, 12 LEAD, INITIAL          Collection Time: 09/12/19 12:21 PM         Result  Value  Ref Range            Ventricular Rate  76  BPM       Atrial Rate  76  BPM       P-R Interval  160  ms       QRS Duration  132  ms       Q-T Interval  400  ms       QTC Calculation (Bezet)  450  ms       Calculated P Axis  61  degrees       Calculated R Axis  -33  degrees       Calculated T Axis  132  degrees       Diagnosis                 Normal sinus rhythm   Left axis deviation   Left bundle branch block   Abnormal ECG   No previous ECGs available          CBC WITH AUTOMATED DIFF          Collection Time: 09/12/19 12:28 PM         Result  Value  Ref Range            WBC  4.5  3.6 - 11.0 K/uL       RBC  4.04  3.80 - 5.20 M/uL       HGB  10.7 (L)  11.5 - 16.0 g/dL       HCT   32.8 (L)  35.0 - 47.0 %       MCV  81.2  80.0 - 99.0 FL       MCH  26.5  26.0 - 34.0 PG       MCHC  32.6  30.0 - 36.5 g/dL       RDW  15.9 (H)  11.5 - 14.5 %       PLATELET  237  150 - 400 K/uL       MPV  9.8  8.9 - 12.9 FL       NRBC  0.0  0 PER 100 WBC       ABSOLUTE NRBC  0.00  0.00 - 0.01 K/uL       NEUTROPHILS  59  32 - 75 %       LYMPHOCYTES  28  12 - 49 %       MONOCYTES  8  5 - 13 %       EOSINOPHILS  4  0 - 7 %       BASOPHILS  1  0 - 1 %       IMMATURE GRANULOCYTES  0  0.0 - 0.5 %       ABS. NEUTROPHILS  2.7  1.8 - 8.0 K/UL       ABS. LYMPHOCYTES  1.3  0.8 - 3.5 K/UL  ABS. MONOCYTES  0.4  0.0 - 1.0 K/UL       ABS. EOSINOPHILS  0.2  0.0 - 0.4 K/UL       ABS. BASOPHILS  0.0  0.0 - 0.1 K/UL       ABS. IMM. GRANS.  0.0  0.00 - 0.04 K/UL       DF  AUTOMATED          METABOLIC PANEL, BASIC          Collection Time: 09/12/19 12:28 PM         Result  Value  Ref Range            Sodium  143  136 - 145 mmol/L       Potassium  3.5  3.5 - 5.1 mmol/L       Chloride  104  97 - 108 mmol/L       CO2  27  21 - 32 mmol/L       Anion gap  12  5 - 15 mmol/L       Glucose  105 (H)  65 - 100 mg/dL       BUN  14  6 - 20 MG/DL       Creatinine  1.61 (H)  0.55 - 1.02 MG/DL       BUN/Creatinine ratio  9 (L)  12 - 20         GFR est AA  37 (L)  >60 ml/min/1.68m2       GFR est non-AA  31 (L)  >60 ml/min/1.75m2       Calcium  8.9  8.5 - 10.1 MG/DL       TROPONIN I          Collection Time: 09/12/19 12:28 PM         Result  Value  Ref Range            Troponin-I, Qt.  <0.05  <0.05 ng/mL       SAMPLES BEING HELD          Collection Time: 09/12/19 12:28 PM         Result  Value  Ref Range            SAMPLES BEING HELD  1SST,1BLUE,1RED         COMMENT                  Add-on orders for these samples will be processed based on acceptable specimen integrity and analyte stability, which may vary by analyte.           Radiologic Studies -      XR CHEST SNGL V       Final Result     No acute abnormality identified.                                 CT SPINE CERV WO CONT       Final Result     1. Evidence of cervical spondylosis.     2. Evidence of very mild cervical scoliosis convex to the left.                 CT Results   (Last 48 hours)  09/12/19 1239    CT SPINE CERV WO CONT  Final result            Impression:    1. Evidence of cervical spondylosis.      2. Evidence of very mild cervical scoliosis convex to the left.                       Narrative:    EXAM:  CT CERVICAL SPINE WITHOUT CONTRAST             INDICATION: neck pain.             COMPARISON: None.             CONTRAST:  None.             TECHNIQUE: Multislice helical CT of the cervical spine was performed without      intravenous contrast administration.  Sagittal and coronal reconstructions were      generated.  CT dose reduction was achieved through use of a standardized      protocol tailored for this examination and automatic exposure control for dose      modulation.              FINDINGS:      There is evidence of very mild cervical scoliosis convex to the left. There is      evidence of cervical spondylosis. Some intervertebral disc space narrowing is      noted at the C5-6 level. Anterior and posterior osteophytes formation is noted      at this level. There is minimal impression upon the anterior aspect of the      thecal sac at this level. The facet joints exhibit normal alignment.                                 CXR Results   (Last 48 hours)                                    09/12/19 1247    XR CHEST SNGL V  Final result            Impression:    No acute abnormality identified.                                                   Narrative:    EXAM:  XR CHEST SNGL V             INDICATION:  Chest pain             COMPARISON: 2014.             FINDINGS: A single view of the chest demonstrates clear lungs.  Heart size is      upper limits of normal.  There are degenerative changes mid to lower thoracic      spine and in the shoulders.                                         Medical Decision Making     I am the  first provider for this patient.      I reviewed the vital signs, available nursing notes, past medical history, past surgical history, family history and social history.      Vital Signs-Reviewed the patient's vital signs.   Patient Vitals for the past 12 hrs:            Temp  Pulse  Resp  BP  SpO2            09/12/19 1320  --  70  16  134/71  98 %            09/12/19 1200  98.6 ??F (37 ??C)  91  24  (!) 165/85  95 %           EKG interpretation: (Preliminary)   EKG shows sinus rhythm, rate 76.  Leftward axis.  Left bundle branch block.  No evidence of ST elevation myocardial infarction.  Interpreted by me      Records Reviewed:    Nursing notes, Prior visits       Provider Notes (Medical Decision Making):    Patient evaluated found to be in no acute cardiopulmonary distress.  Exam is most consistent with musculoskeletal pain.  Patient is afebrile, nontoxic, no concern for meningitis at this time.  Could  be cardiac equivalent, will obtain EKG, cardiac biomarkers.  Obtain CT imaging of the C-spine given her age paresthesias, may have some central canal stenosis.  Disposition pending laboratory work-up, imaging      ED Course:    Initial assessment performed. The patients presenting problems have been discussed, and they are in agreement with the care plan formulated and outlined with them.  I have encouraged them to ask questions as they arise throughout their visit.                   Prior to discharge patient feeling better, labs, imaging reassuring.  Will prescribe some tramadol and Robaxin for pain control at home, given follow-up for orthopedic spine surgery if pain is not improving  in 2 weeks.      Critical Care Time:    none      Disposition:      DISCHARGE NOTE  Patients results have been reviewed with them.  Patient and/or family have verbally conveyed their understanding and agreement of the patient's signs, symptoms, diagnosis, treatment  and  prognosis and additionally agree to follow up as recommended or return to the Emergency Room should their condition change or have any new concerns prior to their follow-up appointment. Patient verbally agrees with the care-plan and verbally conveys  that all of their questions have been answered.   Discharge instructions have also been provided to the patient with some educational information regarding their diagnosis as well a list of reasons why they would want to return to the ER prior to their  follow-up appointment should their condition change.      PLAN:   1.      Discharge Medication List as of 09/12/2019  2:10 PM              START taking these medications          Details        methocarbamoL (Robaxin-750) 750 mg tablet  Take 1 Tab by mouth three (3) times daily as needed for Muscle Spasm(s)., Normal, Disp-15 Tab, R-0               diclofenac (VOLTAREN) 1 % gel  Apply  to affected area four (4) times daily., Normal, Disp-100 g, R-0                     CONTINUE these medications which have CHANGED          Details        !! traMADoL (ULTRAM) 50 mg tablet  Take 1 Tab by mouth every six (6) hours as needed for Pain for up to 4 days. Max Daily Amount: 200 mg. Indications: pain, Normal, Disp-10 Tab, R-0               !! - Potential duplicate medications found. Please discuss with provider.              CONTINUE these medications which have NOT CHANGED          Details        metoprolol tartrate (LOPRESSOR) 25 mg tablet  Take 12.5 mg by mouth two (2) times a day., Historical Med               levocetirizine (XYZAL) 5 mg tablet  Take  by mouth., Historical Med               !! traMADoL (ULTRAM) 50 mg tablet  Take 50 mg by mouth every six (6) hours as needed for Pain., Historical Med               diphenoxylate-atropine (LomotiL) 2.5-0.025 mg per tablet  Take  by mouth four (4) times daily as needed for Diarrhea., Historical Med               ergocalciferol (Vitamin D2) 1,250 mcg (50,000 unit) capsule  Take 50,000  Units by mouth., Historical Med               rosuvastatin (CRESTOR) 40 mg tablet  Take 40 mg by mouth nightly., Historical Med               isosorbide mononitrate ER (IMDUR) 30 mg tablet  Take 30 mg by mouth daily., Historical Med               LEVOTHYROXINE SODIUM (LEVOTHROID PO)  Take 50 mg by mouth., Historical Med               clopidogrel (PLAVIX) 75 mg tablet  Take  by mouth daily., Historical Med               allopurinol (ZYLOPRIM) 100 mg tablet  Take 100 mg by mouth daily., Historical Med               lisinopril (PRINIVIL, ZESTRIL) 40 mg tablet  Take 40 mg by mouth daily., Historical Med               Arformoterol (BROVANA) 15 mcg/2 mL nebu neb solution  15 mcg by Nebulization route two (2) times daily as needed., Historical Med               HYDROcodone-acetaminophen (NORCO) 5-325 mg per tablet  Take 1 Tab by mouth every four (4) hours as needed., Print, Disp-10 Tab, R-0               lovastatin (MEVACOR) 40 mg tablet  Take 40 mg by mouth nightly., Historical Med               colchicine (COLCRYS) 0.6 mg tablet  Take 0.6 mg by mouth daily., Historical Med  fluticasone (VERAMYST) 27.5 mcg/actuation nasal spray  2 Sprays by Nasal route two (2) times daily as needed for Rhinitis., Historical Med               !! - Potential duplicate medications found. Please discuss with provider.               2.      Follow-up Information               Follow up With  Specialties  Details  Why  Contact Info              [facility] EMERGENCY DEP  Emergency Medicine    If symptoms worsen  [address]   [place] [address]   [phone]              Job Founds, MD  Orthopedic Surgery  In 2 weeks    [address]   [address]   [place] [address]   [phone]                   Return to ED if worse         Diagnosis        Clinical Impression:       1.  Neck pain         2.  Arthritis            Attestations:   This note was completed by Marylu Lund, DO

## 2019-09-12 NOTE — ED Provider Notes (Signed)
EMERGENCY DEPARTMENT HISTORY AND PHYSICAL EXAM      Date: 09/12/2019  Patient Name: Tanya [name]    Please note that this dictation was completed with Dragon, the computer voice recognition software.  Quite often unanticipated grammatical, syntax, homophones, and other interpretive errors are inadvertently transcribed by the computer software.  Please disregard these errors.  Please excuse any errors that have escaped final proofreading.    History of Presenting Illness     Chief Complaint   Patient presents with   ??? Neck Pain       History Provided By: Patient     HPI: Tanya [name], 79 y.o. female, presenting the emergency department complaining of neck pain.  Patient has a history of coronary artery disease, hypertension, chronic pain.  She is up visiting from [place].  She states she has had neck pain for the past 2 days, it is bilateral, worse on the right and radiates into her chest.  Its worse with movement, was relieved mildly by tramadol, but she just ran out of her tramadol.  Denies any new or worsening shortness of breath, reports she feels short of breath at baseline which is chronic for her.  Denies any unilateral leg pain or leg swelling.  No weakness in the upper extremities, but reports intermittent numbness and tingling in the bilateral upper extremities.  Pain is worse with movement, range of motion is limited.  Nothing makes her symptoms better other than what is described above.    PCP: [facility], Not On File    No current facility-administered medications on file prior to encounter.      Current Outpatient Medications on File Prior to Encounter   Medication Sig Dispense Refill   ??? metoprolol tartrate (LOPRESSOR) 25 mg tablet Take 12.5 mg by mouth two (2) times a day.     ??? levocetirizine (XYZAL) 5 mg tablet Take  by mouth.     ??? traMADoL (ULTRAM) 50 mg tablet Take 50 mg by mouth every six (6) hours as needed for Pain.     ??? diphenoxylate-atropine (LomotiL) 2.5-0.025 mg per tablet Take  by  mouth four (4) times daily as needed for Diarrhea.     ??? ergocalciferol (Vitamin D2) 1,250 mcg (50,000 unit) capsule Take 50,000 Units by mouth.     ??? rosuvastatin (CRESTOR) 40 mg tablet Take 40 mg by mouth nightly.     ??? isosorbide mononitrate ER (IMDUR) 30 mg tablet Take 30 mg by mouth daily.     ??? LEVOTHYROXINE SODIUM (LEVOTHROID PO) Take 50 mg by mouth.     ??? clopidogrel (PLAVIX) 75 mg tablet Take  by mouth daily.     ??? allopurinol (ZYLOPRIM) 100 mg tablet Take 100 mg by mouth daily.     ??? lisinopril (PRINIVIL, ZESTRIL) 40 mg tablet Take 40 mg by mouth daily.     ??? Arformoterol (BROVANA) 15 mcg/2 mL nebu neb solution 15 mcg by Nebulization route two (2) times daily as needed.     ??? HYDROcodone-acetaminophen (NORCO) 5-325 mg per tablet Take 1 Tab by mouth every four (4) hours as needed. 10 Tab 0   ??? lovastatin (MEVACOR) 40 mg tablet Take 40 mg by mouth nightly.     ??? colchicine (COLCRYS) 0.6 mg tablet Take 0.6 mg by mouth daily.     ??? fluticasone (VERAMYST) 27.5 mcg/actuation nasal spray 2 Sprays by Nasal route two (2) times daily as needed for Rhinitis.     ??? [DISCONTINUED] diclofenac EC (  VOLTAREN) 25 mg EC tablet Take 25 mg by mouth daily.         Past History     Past Medical History:  Past Medical History:   Diagnosis Date   ??? Acid reflux    ??? Asthma    ??? CAD (coronary artery disease)    ??? Hypertension        Past Surgical History:  Past Surgical History:   Procedure Laterality Date   ??? HX ORTHOPAEDIC      right knee replacement   ??? PR CARDIAC SURG PROCEDURE UNLIST      Stents       Family History:  History reviewed. No pertinent family history.    Social History:  Social History     Tobacco Use   ??? Smoking status: Current Every Day Smoker     Packs/day: 0.25     Years: 50.00     Pack years: 12.50   ??? Smokeless tobacco: Never Used   Substance Use Topics   ??? Alcohol use: Yes     Alcohol/week: 2.5 standard drinks     Types: 3 Cans of beer per week     Comment: 2-3 beers on weekend   ??? Drug use: Never        Allergies:  Allergies   Allergen Reactions   ??? Iodinated Contrast Media Unknown (comments)   ??? Pcn [Penicillins] Hives         Review of Systems   Review of Systems   Constitutional: Negative for chills and fever.   HENT: Negative for congestion and sore throat.    Eyes: Negative for visual disturbance.   Respiratory: Negative for cough and shortness of breath.    Cardiovascular: Positive for chest pain. Negative for leg swelling.   Gastrointestinal: Negative for abdominal pain, blood in stool, diarrhea and nausea.   Endocrine: Negative for polyuria.   Genitourinary: Negative for dysuria, flank pain, vaginal bleeding and vaginal discharge.   Musculoskeletal: Positive for neck pain and neck stiffness. Negative for myalgias.   Skin: Negative for rash.   Allergic/Immunologic: Negative for immunocompromised state.   Neurological: Negative for weakness and headaches.   Psychiatric/Behavioral: Negative for confusion.       Physical Exam   Physical Exam  Vitals signs and nursing note reviewed.   Constitutional:       Appearance: She is well-developed. She is obese.   HENT:      Head: Normocephalic and atraumatic.   Eyes:      General:         Right eye: No discharge.         Left eye: No discharge.      Conjunctiva/sclera: Conjunctivae normal.      Pupils: Pupils are equal, round, and reactive to light.   Neck:      Musculoskeletal: Normal range of motion and neck supple.      Trachea: No tracheal deviation.        Comments: No midline point tenderness or step-off, range of motion limited secondary to pain.  No masses palpable.  Cardiovascular:      Rate and Rhythm: Normal rate and regular rhythm.      Heart sounds: Normal heart sounds. No murmur.   Pulmonary:      Effort: Pulmonary effort is normal. No respiratory distress.      Breath sounds: Normal breath sounds. No wheezing or rales.   Abdominal:      General: Bowel sounds are normal.  Palpations: Abdomen is soft.      Tenderness: There is no abdominal tenderness.  There is no guarding or rebound.   Musculoskeletal: Normal range of motion.         General: No tenderness or deformity.   Skin:     General: Skin is warm and dry.      Findings: No erythema or rash.   Neurological:      Mental Status: She is alert and oriented to person, place, and time.   Psychiatric:         Behavior: Behavior normal.         Diagnostic Study Results     Labs -     Recent Results (from the past 12 hour(s))   EKG, 12 LEAD, INITIAL    Collection Time: 09/12/19 12:21 PM   Result Value Ref Range    Ventricular Rate 76 BPM    Atrial Rate 76 BPM    P-R Interval 160 ms    QRS Duration 132 ms    Q-T Interval 400 ms    QTC Calculation (Bezet) 450 ms    Calculated P Axis 61 degrees    Calculated R Axis -33 degrees    Calculated T Axis 132 degrees    Diagnosis       Normal sinus rhythm  Left axis deviation  Left bundle branch block  Abnormal ECG  No previous ECGs available     CBC WITH AUTOMATED DIFF    Collection Time: 09/12/19 12:28 PM   Result Value Ref Range    WBC 4.5 3.6 - 11.0 K/uL    RBC 4.04 3.80 - 5.20 M/uL    HGB 10.7 (L) 11.5 - 16.0 g/dL    HCT 16.1 (L) 09.6 - 47.0 %    MCV 81.2 80.0 - 99.0 FL    MCH 26.5 26.0 - 34.0 PG    MCHC 32.6 30.0 - 36.5 g/dL    RDW 04.5 (H) 40.9 - 14.5 %    PLATELET 237 150 - 400 K/uL    MPV 9.8 8.9 - 12.9 FL    NRBC 0.0 0 PER 100 WBC    ABSOLUTE NRBC 0.00 0.00 - 0.01 K/uL    NEUTROPHILS 59 32 - 75 %    LYMPHOCYTES 28 12 - 49 %    MONOCYTES 8 5 - 13 %    EOSINOPHILS 4 0 - 7 %    BASOPHILS 1 0 - 1 %    IMMATURE GRANULOCYTES 0 0.0 - 0.5 %    ABS. NEUTROPHILS 2.7 1.8 - 8.0 K/UL    ABS. LYMPHOCYTES 1.3 0.8 - 3.5 K/UL    ABS. MONOCYTES 0.4 0.0 - 1.0 K/UL    ABS. EOSINOPHILS 0.2 0.0 - 0.4 K/UL    ABS. BASOPHILS 0.0 0.0 - 0.1 K/UL    ABS. IMM. GRANS. 0.0 0.00 - 0.04 K/UL    DF AUTOMATED     METABOLIC PANEL, BASIC    Collection Time: 09/12/19 12:28 PM   Result Value Ref Range    Sodium 143 136 - 145 mmol/L    Potassium 3.5 3.5 - 5.1 mmol/L    Chloride 104 97 - 108 mmol/L    CO2  27 21 - 32 mmol/L    Anion gap 12 5 - 15 mmol/L    Glucose 105 (H) 65 - 100 mg/dL    BUN 14 6 - 20 MG/DL    Creatinine 8.11 (H) 0.55 - 1.02 MG/DL    BUN/Creatinine  ratio 9 (L) 12 - 20      GFR est AA 37 (L) >60 ml/min/1.38m2    GFR est non-AA 31 (L) >60 ml/min/1.23m2    Calcium 8.9 8.5 - 10.1 MG/DL   TROPONIN I    Collection Time: 09/12/19 12:28 PM   Result Value Ref Range    Troponin-I, Qt. <0.05 <0.05 ng/mL   SAMPLES BEING HELD    Collection Time: 09/12/19 12:28 PM   Result Value Ref Range    SAMPLES BEING HELD 1SST,1BLUE,1RED     COMMENT        Add-on orders for these samples will be processed based on acceptable specimen integrity and analyte stability, which may vary by analyte.       Radiologic Studies -   XR CHEST SNGL V   Final Result   No acute abnormality identified.                  CT SPINE CERV WO CONT   Final Result   1. Evidence of cervical spondylosis.   2. Evidence of very mild cervical scoliosis convex to the left.        CT Results  (Last 48 hours)               09/12/19 1239  CT SPINE CERV WO CONT Final result    Impression:  1. Evidence of cervical spondylosis.   2. Evidence of very mild cervical scoliosis convex to the left.       Narrative:  EXAM:  CT CERVICAL SPINE WITHOUT CONTRAST       INDICATION: neck pain.       COMPARISON: None.       CONTRAST:  None.       TECHNIQUE: Multislice helical CT of the cervical spine was performed without   intravenous contrast administration.  Sagittal and coronal reconstructions were   generated.  CT dose reduction was achieved through use of a standardized   protocol tailored for this examination and automatic exposure control for dose   modulation.        FINDINGS:   There is evidence of very mild cervical scoliosis convex to the left. There is   evidence of cervical spondylosis. Some intervertebral disc space narrowing is   noted at the C5-6 level. Anterior and posterior osteophytes formation is noted   at this level. There is minimal impression upon the  anterior aspect of the   thecal sac at this level. The facet joints exhibit normal alignment.               CXR Results  (Last 48 hours)               09/12/19 1247  XR CHEST SNGL V Final result    Impression:  No acute abnormality identified.                       Narrative:  EXAM:  XR CHEST SNGL V       INDICATION:  Chest pain       COMPARISON: 2014.       FINDINGS: A single view of the chest demonstrates clear lungs.  Heart size is   upper limits of normal.  There are degenerative changes mid to lower thoracic   spine and in the shoulders.                    Medical Decision Making   I am the  first provider for this patient.    I reviewed the vital signs, available nursing notes, past medical history, past surgical history, family history and social history.    Vital Signs-Reviewed the patient's vital signs.  Patient Vitals for the past 12 hrs:   Temp Pulse Resp BP SpO2   09/12/19 1320 ??? 70 16 134/71 98 %   09/12/19 1200 98.6 ??F (37 ??C) 91 24 (!) 165/85 95 %       EKG interpretation: (Preliminary)  EKG shows sinus rhythm, rate 76.  Leftward axis.  Left bundle branch block.  No evidence of ST elevation myocardial infarction.  Interpreted by me    Records Reviewed:   Nursing notes, Prior visits     Provider Notes (Medical Decision Making):   Patient evaluated found to be in no acute cardiopulmonary distress.  Exam is most consistent with musculoskeletal pain.  Patient is afebrile, nontoxic, no concern for meningitis at this time.  Could be cardiac equivalent, will obtain EKG, cardiac biomarkers.  Obtain CT imaging of the C-spine given her age paresthesias, may have some central canal stenosis.  Disposition pending laboratory work-up, imaging    ED Course:   Initial assessment performed. The patients presenting problems have been discussed, and they are in agreement with the care plan formulated and outlined with them.  I have encouraged them to ask questions as they arise throughout their visit.             Prior  to discharge patient feeling better, labs, imaging reassuring.  Will prescribe some tramadol and Robaxin for pain control at home, given follow-up for orthopedic spine surgery if pain is not improving in 2 weeks.    Critical Care Time:   none    Disposition:    DISCHARGE NOTE  Patients results have been reviewed with them.  Patient and/or family have verbally conveyed their understanding and agreement of the patient's signs, symptoms, diagnosis, treatment and prognosis and additionally agree to follow up as recommended or return to the Emergency Room should their condition change or have any new concerns prior to their follow-up appointment. Patient verbally agrees with the care-plan and verbally conveys that all of their questions have been answered.   Discharge instructions have also been provided to the patient with some educational information regarding their diagnosis as well a list of reasons why they would want to return to the ER prior to their follow-up appointment should their condition change.    PLAN:  1.   Discharge Medication List as of 09/12/2019  2:10 PM      START taking these medications    Details   methocarbamoL (Robaxin-750) 750 mg tablet Take 1 Tab by mouth three (3) times daily as needed for Muscle Spasm(s)., Normal, Disp-15 Tab, R-0      diclofenac (VOLTAREN) 1 % gel Apply  to affected area four (4) times daily., Normal, Disp-100 g, R-0         CONTINUE these medications which have CHANGED    Details   !! traMADoL (ULTRAM) 50 mg tablet Take 1 Tab by mouth every six (6) hours as needed for Pain for up to 4 days. Max Daily Amount: 200 mg. Indications: pain, Normal, Disp-10 Tab, R-0       !! - Potential duplicate medications found. Please discuss with provider.      CONTINUE these medications which have NOT CHANGED    Details   metoprolol tartrate (LOPRESSOR) 25 mg tablet Take 12.5 mg by mouth  two (2) times a day., Historical Med      levocetirizine (XYZAL) 5 mg tablet Take  by mouth., Historical  Med      !! traMADoL (ULTRAM) 50 mg tablet Take 50 mg by mouth every six (6) hours as needed for Pain., Historical Med      diphenoxylate-atropine (LomotiL) 2.5-0.025 mg per tablet Take  by mouth four (4) times daily as needed for Diarrhea., Historical Med      ergocalciferol (Vitamin D2) 1,250 mcg (50,000 unit) capsule Take 50,000 Units by mouth., Historical Med      rosuvastatin (CRESTOR) 40 mg tablet Take 40 mg by mouth nightly., Historical Med      isosorbide mononitrate ER (IMDUR) 30 mg tablet Take 30 mg by mouth daily., Historical Med      LEVOTHYROXINE SODIUM (LEVOTHROID PO) Take 50 mg by mouth., Historical Med      clopidogrel (PLAVIX) 75 mg tablet Take  by mouth daily., Historical Med      allopurinol (ZYLOPRIM) 100 mg tablet Take 100 mg by mouth daily., Historical Med      lisinopril (PRINIVIL, ZESTRIL) 40 mg tablet Take 40 mg by mouth daily., Historical Med      Arformoterol (BROVANA) 15 mcg/2 mL nebu neb solution 15 mcg by Nebulization route two (2) times daily as needed., Historical Med      HYDROcodone-acetaminophen (NORCO) 5-325 mg per tablet Take 1 Tab by mouth every four (4) hours as needed., Print, Disp-10 Tab, R-0      lovastatin (MEVACOR) 40 mg tablet Take 40 mg by mouth nightly., Historical Med      colchicine (COLCRYS) 0.6 mg tablet Take 0.6 mg by mouth daily., Historical Med      fluticasone (VERAMYST) 27.5 mcg/actuation nasal spray 2 Sprays by Nasal route two (2) times daily as needed for Rhinitis., Historical Med       !! - Potential duplicate medications found. Please discuss with provider.        2.   Follow-up Information     Follow up With Specialties Details Why Contact Info    [facility]  If symptoms worsen [facility]    Grover Canavan, MD Orthopedic Surgery In 2 weeks  [address]  [place] [address]  [phone]            Return to ED if worse     Diagnosis     Clinical Impression:    1. Neck pain    2. Arthritis        Attestations:  This note was completed by Alan Ripper, DO

## 2019-09-25 LAB — EKG 12-LEAD
Atrial Rate: 76 {beats}/min
Diagnosis: NORMAL
P Axis: 61 degrees
P-R Interval: 160 ms
Q-T Interval: 400 ms
QRS Duration: 132 ms
QTc Calculation (Bazett): 450 ms
R Axis: -33 degrees
T Axis: 132 degrees
Ventricular Rate: 76 {beats}/min

## 2019-09-25 LAB — EKG, 12 LEAD, INITIAL
Atrial Rate: 76 {beats}/min
Calculated P Axis: 61 degrees
Calculated R Axis: -33 degrees
Calculated T Axis: 132 degrees
Diagnosis: NORMAL
P-R Interval: 160 ms
Q-T Interval: 400 ms
QRS Duration: 132 ms
QTC Calculation (Bezet): 450 ms
Ventricular Rate: 76 {beats}/min

## 2020-01-08 IMAGING — CT CT ABD-PELV W/O CM
2 of 4 series · 16 of 46 positions shown, 18 images · non-contrast
Comparison: None.

CLINICAL DATA: Right lower quadrant pain and flank pain.

EXAM:
CT ABDOMEN AND PELVIS WITHOUT CONTRAST
TECHNIQUE: Multidetector CT imaging of the abdomen and pelvis was performed
following the standard protocol without IV contrast.

[Series 3: a/p w/o 5mm · axial · non-contrast · 0.98mm/px · z∈[+922,+1357]mm · 13 of 97 slices shown, 15 images]
[im 5/97  soft-tissue]
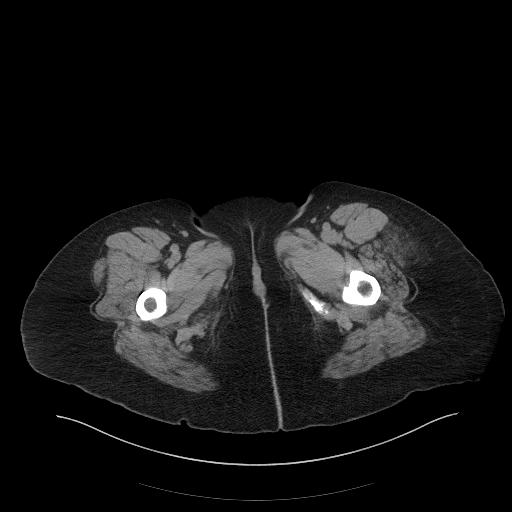
[im 5/97  bone]
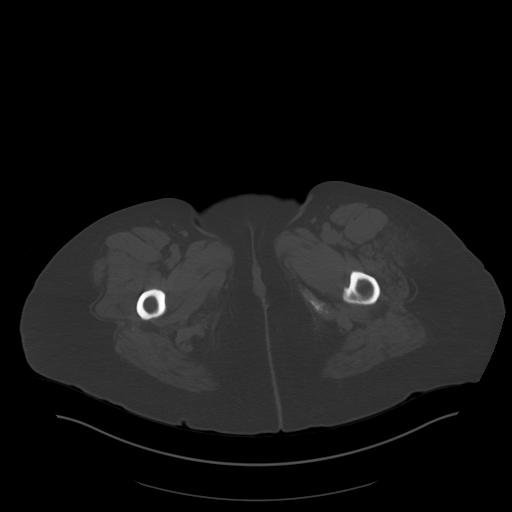
[im 13/97  soft-tissue]
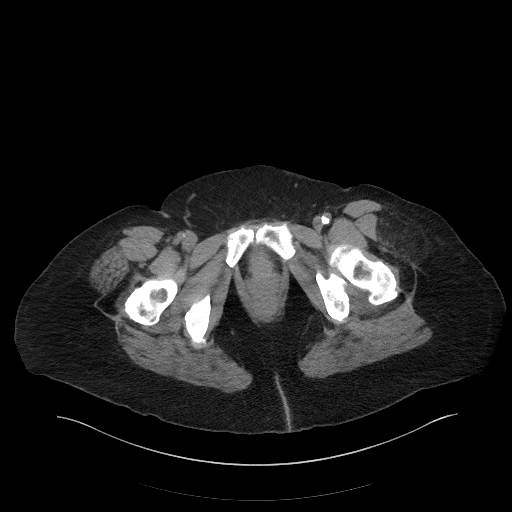
[im 21/97  soft-tissue]
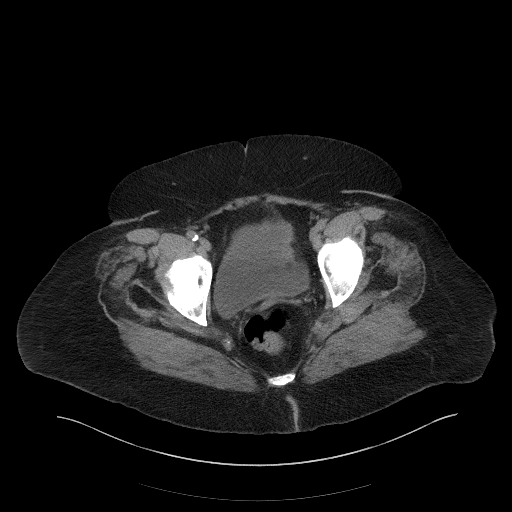
[im 26/97  soft-tissue]
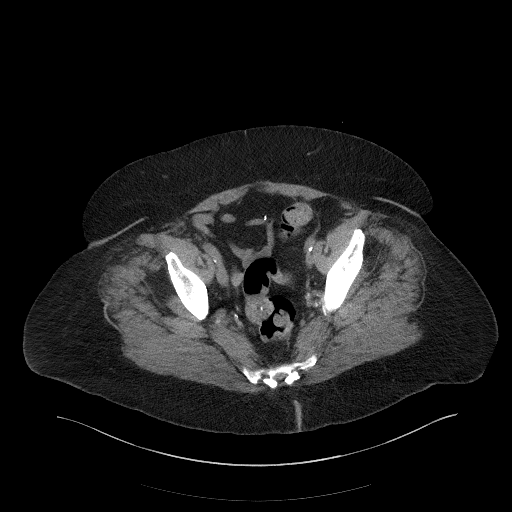
[im 34/97  soft-tissue]
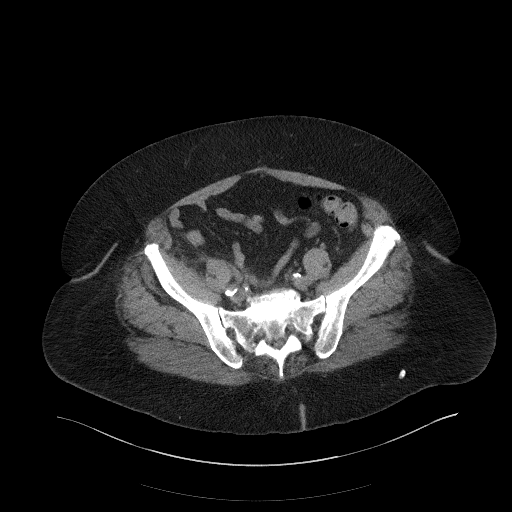
[im 42/97  soft-tissue]
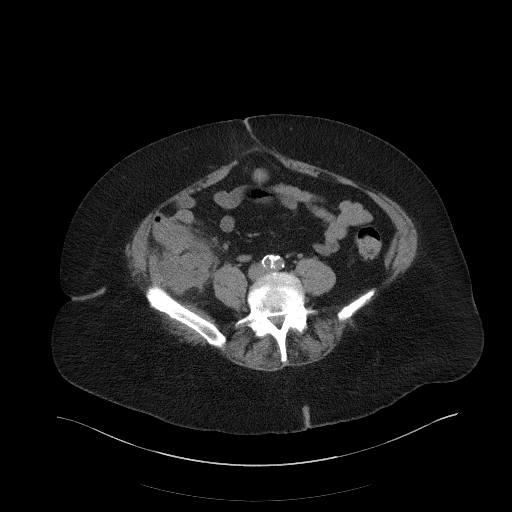
[im 51/97  soft-tissue]
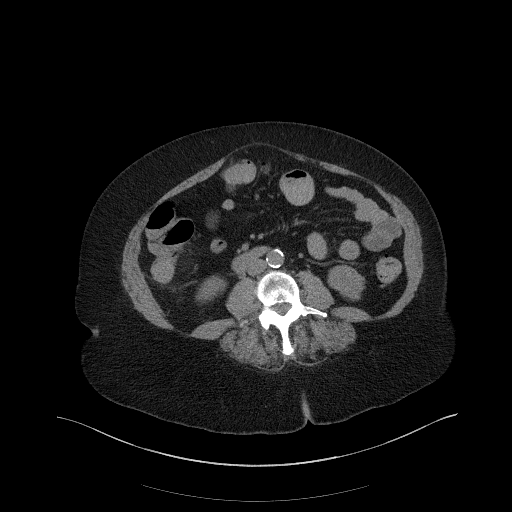
[im 55/97  soft-tissue]
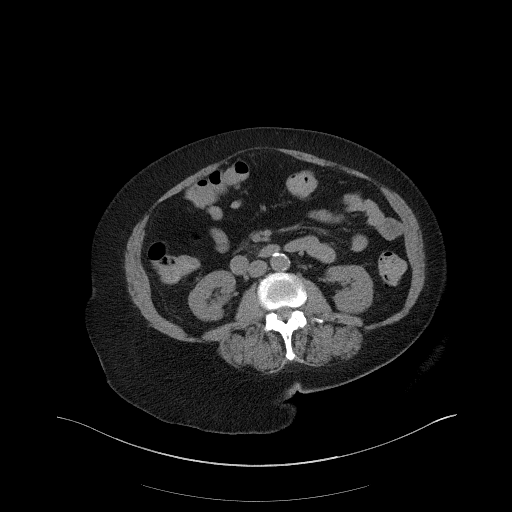
[im 63/97  soft-tissue]
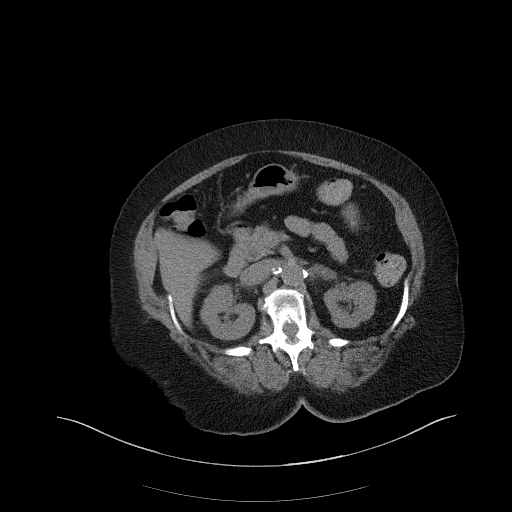
[im 63/97  bone]
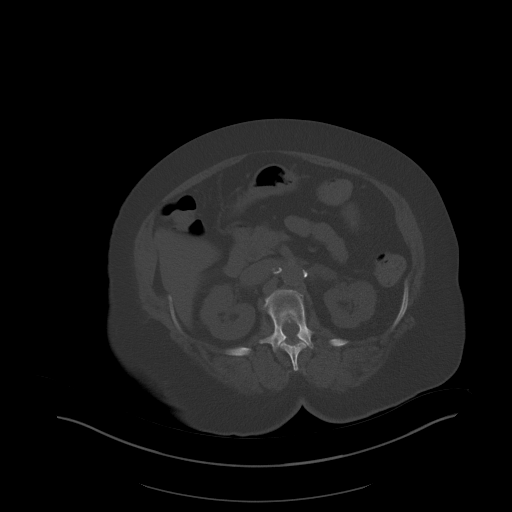
[im 71/97  soft-tissue]
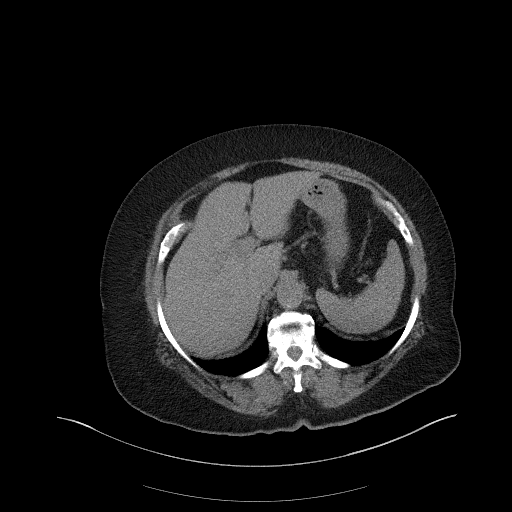
[im 76/97  soft-tissue]
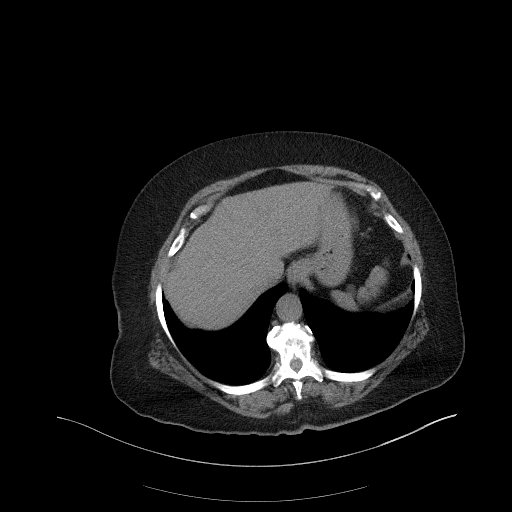
[im 84/97  soft-tissue]
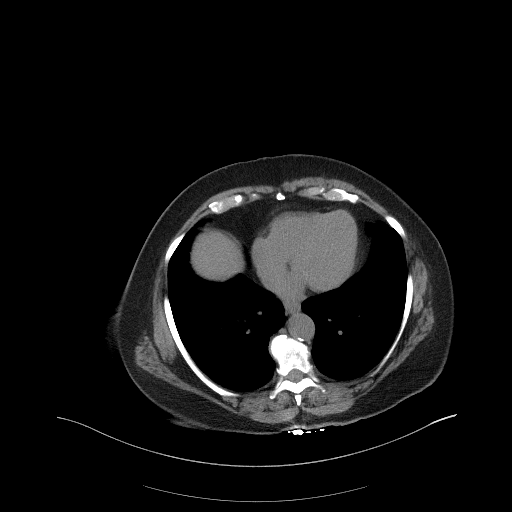
[im 92/97  soft-tissue]
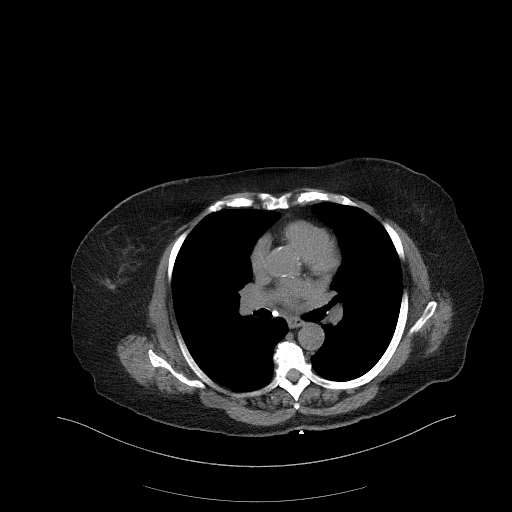

[Series 6: a/p w/o cor · coronal · non-contrast · 0.84mm/px · 3 of 150 slices shown]
[im 50/150  soft-tissue]
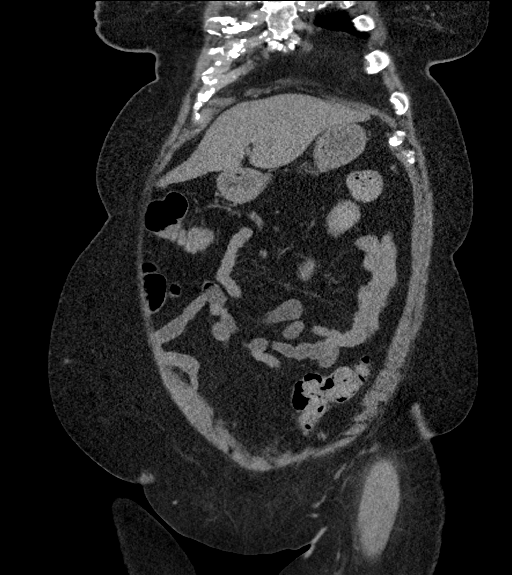
[im 67/150  soft-tissue]
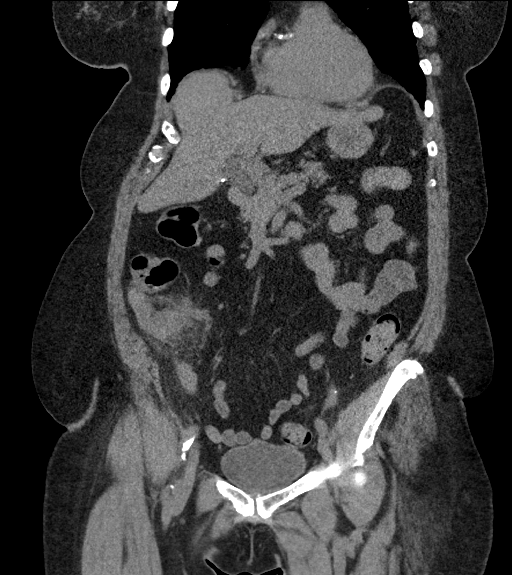
[im 83/150  soft-tissue]
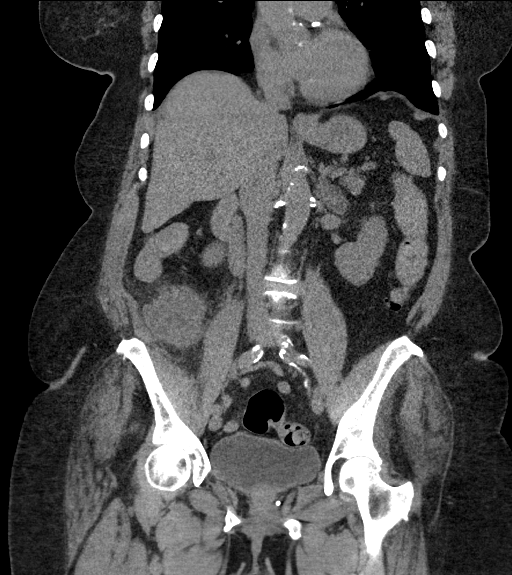

[16 of 46 positions shown; findings below may reference images not displayed]

FINDINGS: Lower chest: Calcific atherosclerotic disease of the coronary
arteries. Calcified mediastinal and hilar lymph nodes, likely due to
prior granulomatous disease.

Hepatobiliary: No focal liver abnormality is seen. Status post
cholecystectomy. No biliary dilatation.

Pancreas: Unremarkable. No pancreatic ductal dilatation or
surrounding inflammatory changes.

Spleen: Normal in size without focal abnormality.

Adrenals/Urinary Tract: Bilateral diffuse adrenal thickening. No
evidence of nephrolithiasis or hydronephrosis.

Stomach/Bowel: Normal appearance of the stomach and small bowel.
x 5.1 x 6.0 cm ill marginated soft tissue mass versus an abscess
emanates from the cecum, just inferior to the ileocecal valve. There
is adjacent mesenteric stranding. Surrounding small round lymph
nodes also seen. The appendix is not clearly identified.

Vascular/Lymphatic: Aortic atherosclerosis. No enlarged abdominal or
pelvic lymph nodes.

Reproductive: Status post hysterectomy. No adnexal masses.

Other: Small fat containing anterior periumbilical abdominal wall
hernia.

Musculoskeletal: Spondylosis of the lumbosacral spine mild
anterolisthesis of L4 on L5, likely degenerative.
IMPRESSION: 6 cm ill marginated soft tissue mass versus an abscess emanating
from the cecum, just inferior to the ileocecal valve. Adjacent
mesenteric stranding and sub pathologic by size criteria but rounded
mesenteric lymph nodes.

No evidence of nephrolithiasis or hydronephrosis.

Bilateral adrenal gland thickening, likely due to adrenal
hyperplasia.

These results were called by telephone at the time of interpretation
on 04/05/2018 at [DATE] to Dr. Suly , who verbally acknowledged
these results.

## 2020-01-27 ENCOUNTER — Other Ambulatory Visit: Payer: Self-pay

## 2020-01-27 ENCOUNTER — Emergency Department (HOSPITAL_COMMUNITY): Payer: Medicare HMO

## 2020-01-27 ENCOUNTER — Encounter: Payer: Self-pay | Admitting: Specialist

## 2020-01-27 ENCOUNTER — Ambulatory Visit: Payer: Self-pay

## 2020-01-27 ENCOUNTER — Inpatient Hospital Stay (HOSPITAL_COMMUNITY)
Admission: EM | Admit: 2020-01-27 | Discharge: 2020-02-01 | DRG: 177 | Disposition: A | Discharge status: Home Health | Payer: Medicare HMO | Attending: Internal Medicine | Admitting: Internal Medicine

## 2020-01-27 ENCOUNTER — Ambulatory Visit: Payer: Medicare HMO | Admitting: Specialist

## 2020-01-27 VITALS — BP 171/96 | HR 95 | Temp 101.1°F | Ht 60.0 in | Wt 192.0 lb

## 2020-01-27 DIAGNOSIS — J440 Chronic obstructive pulmonary disease with acute lower respiratory infection: Secondary | ICD-10-CM | POA: Diagnosis present

## 2020-01-27 DIAGNOSIS — J189 Pneumonia, unspecified organism: Secondary | ICD-10-CM

## 2020-01-27 DIAGNOSIS — M4315 Spondylolisthesis, thoracolumbar region: Secondary | ICD-10-CM | POA: Diagnosis not present

## 2020-01-27 DIAGNOSIS — M1009 Idiopathic gout, multiple sites: Secondary | ICD-10-CM | POA: Diagnosis not present

## 2020-01-27 DIAGNOSIS — R350 Frequency of micturition: Secondary | ICD-10-CM

## 2020-01-27 DIAGNOSIS — Z6837 Body mass index (BMI) 37.0-37.9, adult: Secondary | ICD-10-CM

## 2020-01-27 DIAGNOSIS — Z79890 Hormone replacement therapy (postmenopausal): Secondary | ICD-10-CM

## 2020-01-27 DIAGNOSIS — Z79899 Other long term (current) drug therapy: Secondary | ICD-10-CM

## 2020-01-27 DIAGNOSIS — J1282 Pneumonia due to coronavirus disease 2019: Secondary | ICD-10-CM | POA: Diagnosis present

## 2020-01-27 DIAGNOSIS — D631 Anemia in chronic kidney disease: Secondary | ICD-10-CM | POA: Diagnosis present

## 2020-01-27 DIAGNOSIS — M545 Low back pain: Secondary | ICD-10-CM | POA: Diagnosis present

## 2020-01-27 DIAGNOSIS — U071 COVID-19: Principal | ICD-10-CM | POA: Diagnosis present

## 2020-01-27 DIAGNOSIS — M109 Gout, unspecified: Secondary | ICD-10-CM | POA: Diagnosis present

## 2020-01-27 DIAGNOSIS — A419 Sepsis, unspecified organism: Secondary | ICD-10-CM

## 2020-01-27 DIAGNOSIS — Z880 Allergy status to penicillin: Secondary | ICD-10-CM

## 2020-01-27 DIAGNOSIS — Z8249 Family history of ischemic heart disease and other diseases of the circulatory system: Secondary | ICD-10-CM

## 2020-01-27 DIAGNOSIS — Z9049 Acquired absence of other specified parts of digestive tract: Secondary | ICD-10-CM

## 2020-01-27 DIAGNOSIS — I739 Peripheral vascular disease, unspecified: Secondary | ICD-10-CM | POA: Diagnosis present

## 2020-01-27 DIAGNOSIS — R5381 Other malaise: Secondary | ICD-10-CM | POA: Diagnosis present

## 2020-01-27 DIAGNOSIS — Z720 Tobacco use: Secondary | ICD-10-CM | POA: Diagnosis present

## 2020-01-27 DIAGNOSIS — Z885 Allergy status to narcotic agent status: Secondary | ICD-10-CM

## 2020-01-27 DIAGNOSIS — I1310 Hypertensive heart and chronic kidney disease without heart failure, with stage 1 through stage 4 chronic kidney disease, or unspecified chronic kidney disease: Secondary | ICD-10-CM | POA: Diagnosis present

## 2020-01-27 DIAGNOSIS — Z955 Presence of coronary angioplasty implant and graft: Secondary | ICD-10-CM

## 2020-01-27 DIAGNOSIS — Z90710 Acquired absence of both cervix and uterus: Secondary | ICD-10-CM

## 2020-01-27 DIAGNOSIS — I2510 Atherosclerotic heart disease of native coronary artery without angina pectoris: Secondary | ICD-10-CM | POA: Diagnosis present

## 2020-01-27 DIAGNOSIS — Z8673 Personal history of transient ischemic attack (TIA), and cerebral infarction without residual deficits: Secondary | ICD-10-CM

## 2020-01-27 DIAGNOSIS — Z91041 Radiographic dye allergy status: Secondary | ICD-10-CM

## 2020-01-27 DIAGNOSIS — M25551 Pain in right hip: Secondary | ICD-10-CM

## 2020-01-27 DIAGNOSIS — Z841 Family history of disorders of kidney and ureter: Secondary | ICD-10-CM

## 2020-01-27 DIAGNOSIS — G8929 Other chronic pain: Secondary | ICD-10-CM | POA: Diagnosis present

## 2020-01-27 DIAGNOSIS — K219 Gastro-esophageal reflux disease without esophagitis: Secondary | ICD-10-CM | POA: Diagnosis present

## 2020-01-27 DIAGNOSIS — E785 Hyperlipidemia, unspecified: Secondary | ICD-10-CM | POA: Diagnosis present

## 2020-01-27 DIAGNOSIS — Z7902 Long term (current) use of antithrombotics/antiplatelets: Secondary | ICD-10-CM

## 2020-01-27 DIAGNOSIS — E669 Obesity, unspecified: Secondary | ICD-10-CM | POA: Diagnosis present

## 2020-01-27 DIAGNOSIS — N1831 Chronic kidney disease, stage 3a: Secondary | ICD-10-CM | POA: Diagnosis present

## 2020-01-27 DIAGNOSIS — N179 Acute kidney failure, unspecified: Secondary | ICD-10-CM | POA: Diagnosis present

## 2020-01-27 DIAGNOSIS — Z96651 Presence of right artificial knee joint: Secondary | ICD-10-CM | POA: Diagnosis present

## 2020-01-27 DIAGNOSIS — N183 Chronic kidney disease, stage 3 unspecified: Secondary | ICD-10-CM | POA: Diagnosis present

## 2020-01-27 DIAGNOSIS — E039 Hypothyroidism, unspecified: Secondary | ICD-10-CM | POA: Diagnosis present

## 2020-01-27 DIAGNOSIS — F17210 Nicotine dependence, cigarettes, uncomplicated: Secondary | ICD-10-CM | POA: Diagnosis present

## 2020-01-27 LAB — CBC
HCT: 38.8 % (ref 35.0–45.0)
Hemoglobin: 12.1 g/dL (ref 11.7–15.5)
MCH: 24.8 pg — ABNORMAL LOW (ref 27.0–33.0)
MCHC: 31.2 g/dL — ABNORMAL LOW (ref 32.0–36.0)
MCV: 79.7 fL — ABNORMAL LOW (ref 80.0–100.0)
MPV: 11 fL (ref 7.5–12.5)
Platelets: 249 10*3/uL (ref 140–400)
RBC: 4.87 10*6/uL (ref 3.80–5.10)
RDW: 18.3 % — ABNORMAL HIGH (ref 11.0–15.0)
WBC: 4.2 10*3/uL (ref 3.8–10.8)

## 2020-01-27 LAB — BASIC METABOLIC PANEL
BUN/Creatinine Ratio: 11 (calc) (ref 6–22)
BUN: 16 mg/dL (ref 7–25)
CO2: 23 mmol/L (ref 20–32)
Calcium: 8.8 mg/dL (ref 8.6–10.4)
Chloride: 104 mmol/L (ref 98–110)
Creat: 1.43 mg/dL — ABNORMAL HIGH (ref 0.60–0.88)
Glucose, Bld: 88 mg/dL (ref 65–139)
Potassium: 3.9 mmol/L (ref 3.5–5.3)
Sodium: 139 mmol/L (ref 135–146)

## 2020-01-27 LAB — PROTIME-INR
INR: 1 (ref 0.8–1.2)
Prothrombin Time: 13 seconds (ref 11.4–15.2)

## 2020-01-27 LAB — APTT: aPTT: 21 seconds — ABNORMAL LOW (ref 24–36)

## 2020-01-27 LAB — URIC ACID: Uric Acid, Serum: 4.8 mg/dL (ref 2.5–7.0)

## 2020-01-27 LAB — SEDIMENTATION RATE: Sed Rate: 38 mm/h — ABNORMAL HIGH (ref 0–30)

## 2020-01-27 MED ORDER — ACETAMINOPHEN 325 MG PO TABS
650.0000 mg | ORAL_TABLET | Freq: Once | ORAL | Status: AC
  Administered 2020-01-28: 650 mg via ORAL
  Filled 2020-01-27: qty 2

## 2020-01-27 MED ORDER — METHYLPREDNISOLONE 4 MG PO TBPK: ORAL_TABLET | ORAL | 0 refills | Status: DC

## 2020-01-27 MED ORDER — HYDROCODONE-ACETAMINOPHEN 5-325 MG PO TABS: 1.0000 | ORAL_TABLET | Freq: Four times a day (QID) | ORAL | 0 refills | Status: DC | PRN

## 2020-01-27 MED ORDER — SODIUM CHLORIDE 0.9 % IV SOLN
1.0000 g | INTRAVENOUS | Status: DC
  Administered 2020-01-28: 1 g via INTRAVENOUS
  Filled 2020-01-27: qty 10

## 2020-01-27 MED ORDER — LACTATED RINGERS IV BOLUS
1000.0000 mL | Freq: Once | INTRAVENOUS | Status: AC
  Administered 2020-01-27: 1000 mL via INTRAVENOUS

## 2020-01-27 MED ORDER — SODIUM CHLORIDE 0.9 % IV SOLN
500.0000 mg | Freq: Once | INTRAVENOUS | Status: AC
  Administered 2020-01-28: 500 mg via INTRAVENOUS
  Filled 2020-01-27: qty 500

## 2020-01-27 NOTE — ED Triage Notes (Signed)
Pt presented via EMS from home for wellness check by family.Per EMS pt co weakness, fatigue,n/v and frequent urination since Saturday. Per EMS dyspnea noted with exertion, 97% on room air. 22L hand placed 4mg  Zofran administered in route.

## 2020-01-27 NOTE — ED Provider Notes (Signed)
[facility] EMERGENCY DEPARTMENT Provider Note   CSN: [id number] Arrival date & time: 01/27/20  2246     History Chief Complaint  Patient presents with  . Fatigue  . Nausea  . Emesis    Joan Elliott is a 79 y.o. female.  Patient with history of COPD, CAD, acid reflux disease, hypertension, and CKD who has a 2-day history of generalized fatigue,  headache, nausea for the past 2 days.  Found to be febrile did not note that she had a fever.  She saw her orthopedic today Dr. Louanne Skye for 2-week history of ongoing right-sided low back pain or leg pain.  She was referred for an outpatient MRI which has not occurred yet.  She was told to come to the ED for right-sided back pain and fever and weakness.  She does have some pain with urination but no blood in the urine.  Nausea but no vomiting.  Did not know she had a fever.  Feels fatigued and achy all over with a headache and abdominal pain and is not eating or drinking much.  Denies any chest pain.  Feels somewhat more short of breath than usual.  Her orthopedic doctor did not believe her fever was coming from her spine.  She denies any travel or sick contacts.  The history is provided by the patient and the EMS personnel. The history is limited by the condition of the patient.  Emesis Associated symptoms: arthralgias, chills, cough, fever, headaches, myalgias and sore throat   Associated symptoms: no abdominal pain        Past Medical History:  Diagnosis Date  . Aortic atherosclerosis ([place]) 02/01/2018  . Arthritis   . COPD (chronic obstructive pulmonary disease) ([place])   . Coronary artery disease    s/p stent mid RCA 2003  . GERD (gastroesophageal reflux disease)   . Gout   . HLD (hyperlipidemia)   . Hypertension   . Hypertensive heart disease without CHF   . Hypothyroidism   . Morbid obesity ([place]) 02/01/2018  . Peripheral vascular disease ([place]) 02/01/2018  . Stage 4 chronic kidney disease ([place]) 02/01/2018  . Stroke  ([place])   . Tobacco abuse     Patient Active Problem List   Diagnosis Date Noted  . Dyslipidemia 04/07/2019  . Ileus following gastrointestinal surgery ([place])   . AKI (acute kidney injury) ([place]) 04/05/2018  . Acute perforated appendicitis 04/05/2018  . HTN (hypertension) 04/05/2018  . Aortic atherosclerosis ([place]) 02/01/2018  . Morbid obesity ([place]) 02/01/2018  . CKD (chronic kidney disease) stage 3, GFR 30-59 ml/min 02/01/2018  . Peripheral vascular disease ([place]) 02/01/2018  . Nonspecific chest pain 02/23/2015  . Hypertensive heart disease without CHF   . Coronary artery disease   . Tobacco abuse   . Hypothyroidism   . HLD (hyperlipidemia)   . Gout   . GERD (gastroesophageal reflux disease)     Past Surgical History:  Procedure Laterality Date  . ABDOMINAL HYSTERECTOMY    . LAPAROSCOPY N/A 04/11/2018   Procedure: LAPAROSCOPY DIAGNOSTIC;  Surgeon: Rolm Bookbinder, MD;  Location: [facility];  Service: General;  Laterality: N/A;  . LAPAROTOMY N/A 04/11/2018   Procedure: EXPLORATORY LAPAROTOMY;  Surgeon: Rolm Bookbinder, MD;  Location: [facility];  Service: General;  Laterality: N/A;  . LYSIS OF ADHESION N/A 04/11/2018   Procedure: LYSIS OF ADHESION;  Surgeon: Rolm Bookbinder, MD;  Location: [facility];  Service: General;  Laterality: N/A;  . PARTIAL COLECTOMY N/A 04/11/2018   Procedure: COLECTOMY;  Surgeon: Rolm Bookbinder, MD;  Location: [facility];  Service: General;  Laterality: N/A;  . REPLACEMENT TOTAL KNEE Right   . THYROID SURGERY       OB History   No obstetric history on file.     Family History  Problem Relation Age of Onset  . Hypertension Mother   . Kidney disease Mother   . Hypertension Brother   . Hypertension Sister     Social History   Tobacco Use  . Smoking status: Current Some Day Smoker    Packs/day: 0.50    Types: Cigarettes  . Smokeless tobacco: Never Used  Vaping Use  . Vaping Use: Never used  Substance Use Topics  . Alcohol use: No  . Drug use: No     Home Medications Prior to Admission medications   Medication Sig Start Date End Date Taking? Authorizing Provider  acetaminophen (TYLENOL) 325 MG tablet Take 2 tablets (650 mg total) by mouth every 6 (six) hours as needed for mild pain or fever. 04/24/18   Hongalgi, Lenis Dickinson, MD  allopurinol (ZYLOPRIM) 100 MG tablet Take 1 tablet (100 mg total) by mouth daily. 02/02/18   Nita Sells, MD  clopidogrel (PLAVIX) 75 MG tablet Take 1 tablet (75 mg total) by mouth daily. 02/02/18   Nita Sells, MD  diclofenac sodium (VOLTAREN) 1 % GEL Apply 4 g topically 4 (four) times daily as needed for pain. 03/20/18   [provider]  docusate sodium (COLACE) 100 MG capsule Take 1 capsule (100 mg total) by mouth 2 (two) times daily. 04/24/18   Hongalgi, Lenis Dickinson, MD  gabapentin (NEURONTIN) 100 MG capsule TAKE 1 CAPSULE(100 MG) BY MOUTH AT BEDTIME Patient taking differently: Take 100 mg by mouth at bedtime.  02/02/18   Nita Sells, MD  HYDROcodone-acetaminophen (NORCO/VICODIN) 5-325 MG tablet Take 1 tablet by mouth every 6 (six) hours as needed for moderate pain. 01/27/20   [name], MD  isosorbide mononitrate (IMDUR) 30 MG 24 hr tablet Take 1 tablet (30 mg total) by mouth daily. 07/10/19 10/08/19  Minus Breeding, MD  levothyroxine (SYNTHROID, LEVOTHROID) 50 MCG tablet Take 1 tablet (50 mcg total) by mouth daily. 02/02/18   Nita Sells, MD  methylPREDNISolone (MEDROL DOSEPAK) 4 MG TBPK tablet Take as directed, 6 day  Dose pak. 01/27/20   [name], MD  metoprolol tartrate (LOPRESSOR) 25 MG tablet Take 0.5 tablets (12.5 mg total) by mouth 2 (two) times daily. 02/02/18   Nita Sells, MD  Multiple Vitamin (MULTIVITAMIN WITH MINERALS) TABS tablet Take 1 tablet by mouth daily. 04/25/18   Hongalgi, Lenis Dickinson, MD  nitroGLYCERIN (NITROSTAT) 0.4 MG SL tablet Place 1 tablet (0.4 mg total) under the tongue every 5 (five) minutes as needed for chest pain. 04/08/19 07/07/19  Minus Breeding, MD  oxybutynin (DITROPAN) 5 MG tablet Take 1 tablet (5 mg total) by mouth 2 (two) times daily. 02/02/18   Nita Sells, MD  pantoprazole (PROTONIX) 40 MG tablet Take 1 tablet (40 mg total) by mouth 2 (two) times daily. 02/02/18   Nita Sells, MD  rosuvastatin (CRESTOR) 40 MG tablet Take 1 tablet (40 mg total) by mouth daily. 07/10/19 10/08/19  Minus Breeding, MD  tiZANidine (ZANAFLEX) 4 MG tablet Take 4 mg by mouth 2 (two) times daily as needed for muscle spasms.    [provider]  traMADol (ULTRAM) 50 MG tablet Take 1 tablet (50 mg total) by mouth every 6 (six) hours as needed for moderate pain or severe  pain. 04/24/18   Modena Jansky, MD    Allergies    Ivp dye [iodinated diagnostic agents], Oxycodone, and Penicillins  Review of Systems   Review of Systems  Constitutional: Positive for activity change, appetite change, chills, fatigue and fever.  HENT: Positive for sore throat. Negative for congestion and rhinorrhea.   Respiratory: Positive for cough. Negative for chest tightness.   Cardiovascular: Negative for chest pain and leg swelling.  Gastrointestinal: Positive for nausea and vomiting. Negative for abdominal pain.  Genitourinary: Positive for urgency. Negative for dysuria and hematuria.  Musculoskeletal: Positive for arthralgias, back pain and myalgias.  Neurological: Positive for weakness and headaches.   all other systems are negative except as noted in the HPI and PMH.    Physical Exam Updated Vital Signs BP (!) 159/87 (BP Location: Right Arm)   Pulse 91   Temp 99.1 F (37.3 C) (Oral)   Resp 20   SpO2 98%   Physical Exam Vitals and nursing note reviewed.  Constitutional:      General: She is in acute distress.     Appearance: She is well-developed. She is ill-appearing.     Comments: Fatigued appearing, ill but nontoxic.  dry mucous membrane  HENT:     Head: Normocephalic and atraumatic.     Mouth/Throat:     Mouth: Mucous membranes  are dry.     Pharynx: No oropharyngeal exudate.  Eyes:     Conjunctiva/sclera: Conjunctivae normal.     Pupils: Pupils are equal, round, and reactive to light.  Neck:     Comments: No meningismus. Cardiovascular:     Rate and Rhythm: Normal rate and regular rhythm.     Heart sounds: Normal heart sounds. No murmur heard.   Pulmonary:     Effort: Pulmonary effort is normal. No respiratory distress.     Breath sounds: Normal breath sounds.  Chest:     Chest wall: No tenderness.  Abdominal:     Palpations: Abdomen is soft.     Tenderness: There is no abdominal tenderness. There is no guarding or rebound.  Musculoskeletal:        General: Tenderness present. Normal range of motion.     Cervical back: Normal range of motion and neck supple.     Comments: Right paraspinal lumbar tenderness, no midline tenderness  Able to flex and extend knees bilaterally Intact DP and PT pulses  Skin:    General: Skin is warm.  Neurological:     General: No focal deficit present.     Mental Status: She is alert and oriented to person, place, and time. Mental status is at baseline.     Cranial Nerves: No cranial nerve deficit.     Motor: No abnormal muscle tone.     Coordination: Coordination normal.     Comments:  5/5 strength throughout. CN 2-12 intact.Equal grip strength.   Psychiatric:        Behavior: Behavior normal.     ED Results / Procedures / Treatments   Labs (all labs ordered are listed, but only abnormal results are displayed) Labs Reviewed  SARS CORONAVIRUS 2 BY RT PCR (HOSPITAL ORDER, [facility]) - Abnormal; Notable for the following components:      Result Value   SARS Coronavirus 2 POSITIVE (*)    All other components within normal limits  CBC WITH DIFFERENTIAL/PLATELET - Abnormal; Notable for the following components:   WBC 3.8 (*)    Hemoglobin 11.8 (*)  MCV 77.9 (*)    MCH 25.3 (*)    RDW 18.4 (*)    All other components within normal  limits  COMPREHENSIVE METABOLIC PANEL - Abnormal; Notable for the following components:   CO2 20 (*)    Creatinine, Ser 1.33 (*)    Calcium 8.5 (*)    Albumin 3.3 (*)    GFR calc non Af Amer 38 (*)    GFR calc Af Amer 44 (*)    All other components within normal limits  URINALYSIS, ROUTINE W REFLEX MICROSCOPIC - Abnormal; Notable for the following components:   Hgb urine dipstick MODERATE (*)    Protein, ur 100 (*)    Bacteria, UA RARE (*)    All other components within normal limits  APTT - Abnormal; Notable for the following components:   aPTT 21 (*)    All other components within normal limits  D-DIMER, QUANTITATIVE (NOT AT [facility]) - Abnormal; Notable for the following components:   D-Dimer, Quant 2.34 (*)    All other components within normal limits  TROPONIN I (HIGH SENSITIVITY) - Abnormal; Notable for the following components:   Troponin I (High Sensitivity) 60 (*)    All other components within normal limits  TROPONIN I (HIGH SENSITIVITY) - Abnormal; Notable for the following components:   Troponin I (High Sensitivity) 65 (*)    All other components within normal limits  URINE CULTURE  CULTURE, BLOOD (ROUTINE X 2)  CULTURE, BLOOD (ROUTINE X 2)  LIPASE, BLOOD  LACTIC ACID, PLASMA  PROTIME-INR  LACTIC ACID, PLASMA  CBC  CREATININE, SERUM  C-REACTIVE PROTEIN  TROPONIN I (HIGH SENSITIVITY)  TROPONIN I (HIGH SENSITIVITY)    EKG EKG Interpretation  Date/Time:  Monday January 27 2020 22:48:09 EDT Ventricular Rate:  92 PR Interval:    QRS Duration: 128 QT Interval:  373 QTC Calculation: 462 R Axis:   -48 Text Interpretation: Sinus rhythm Right atrial enlargement Left bundle branch block Nonspecific T wave abnormality Confirmed by Ezequiel Essex [phone]) on 01/27/2020 11:20:10 PM   Radiology CT ABDOMEN PELVIS WO CONTRAST  Result Date: 01/28/2020 CLINICAL DATA:  Sepsis, weakness fatigue vomiting EXAM: CT ABDOMEN AND PELVIS WITHOUT CONTRAST TECHNIQUE: Multidetector CT  imaging of the abdomen and pelvis was performed following the standard protocol without IV contrast. COMPARISON:  None. FINDINGS: Lower chest: The visualized heart size within normal limits. No pericardial fluid/thickening. There is a small hiatal hernia present. Ground-glass opacities are seen at both lung bases, predominantly within the periphery. Hepatobiliary: The liver is normal in density without focal abnormality.The main portal vein is patent. The patient is status post cholecystectomy. No biliary ductal dilation. Pancreas: Unremarkable. No pancreatic ductal dilatation or surrounding inflammatory changes. Spleen: Normal in size without focal abnormality. Adrenals/Urinary Tract: Both adrenal glands appear normal. The kidneys and collecting system appear normal without evidence of urinary tract calculus or hydronephrosis. Bladder is unremarkable. Stomach/Bowel: The stomach, small bowel, and colon are normal in appearance. No inflammatory changes, wall thickening, or obstructive findings. The patient has had a prior right hemicolectomy and appendectomy. Vascular/Lymphatic: There are no enlarged mesenteric, retroperitoneal, or pelvic lymph nodes. Scattered aortic atherosclerotic calcifications are seen without aneurysmal dilatation. Reproductive: The patient is status post hysterectomy. No adnexal masses or collections seen. Other: There is a small abdominal wall hernia containing the tiny loop of bowel. No evidence of bowel strangulation is seen. Within the right lower quadrant there is a loculated area of fat with fat stranding changes measuring approximately 3.2 cm which  could be a prior area of omental infarct/fat necrosis. Musculoskeletal: No acute or significant osseous findings. Degenerative changes seen in the thoracolumbar spine. IMPRESSION: Ground-glass opacities seen at both lung bases which may be due to inflammatory or early infectious etiology. No acute intra-abdominal or pelvic pathology to  explain the patient's symptoms. Aortic Atherosclerosis (ICD10-I70.0). Electronically Signed   By: Prudencio Pair M.D.   On: 01/28/2020 02:20   DG Chest Port 1 View  Result Date: 01/27/2020 CLINICAL DATA:  Sepsis.  Nausea and fatigue. EXAM: PORTABLE CHEST 1 VIEW COMPARISON:  04/11/2018 FINDINGS: Lung volumes are low. Normal heart size with mild aortic tortuosity and atherosclerosis. Minimal patchy opacity at the left greater than right lung base. No pulmonary edema. No pleural fluid or pneumothorax. Degenerative change in the spine. No acute osseous abnormalities are seen. IMPRESSION: Low lung volumes with patchy left greater than right basilar opacity, favor atelectasis. Pneumonia could have a similar appearance in the appropriate clinical setting. Electronically Signed   By: Keith Rake M.D.   On: 01/27/2020 23:24    Procedures Procedures (including critical care time)  Medications Ordered in ED Medications  cefTRIAXone (ROCEPHIN) 1 g in sodium chloride 0.9 % 100 mL IVPB (has no administration in time range)  lactated ringers bolus 1,000 mL (has no administration in time range)  acetaminophen (TYLENOL) tablet 650 mg (has no administration in time range)    ED Course  I have reviewed the triage vital signs and the nursing notes.  Pertinent labs & imaging results that were available during my care of the patient were reviewed by me and considered in my medical decision making (see chart for details).    MDM Rules/Calculators/A&P                         2 days of generalized body ache, fatigue, fever, flank pain.  Febrile on arrival.  Code sepsis was activated.  Suspect possible urinary source.  Given IV fluids after cultures obtained as well as broad-spectrum antibiotics.  UA is not convincing for infection.  There is some atelectasis at the lung bases bilaterally.  Abdominal CT scan is reassuring but does show groundglass opacities at the lung bases. Suspect fever may be due to  pneumonia as well as source of her generalized weakness and fatigue.  Low suspicion for spinal pathology causing her fever.  She has no midline tenderness.  Covid testing is pending. Patient given broad-spectrum antibiotics after cultures are obtained as well as IV fluids.  Blood pressure mental status remained stable.  Covid testing is positive.  Patient remains mildly tachypneic.  She is not hypoxic however.  O2 saturations mid 90s on room air. Will hold steroids at this time. Troponin elevation minimal and flat.  Generalized weakness, fatigue, fever, nausea, vomiting likely secondary to Covid infection.  Given her generalized debility and ongoing fevers will plan observation admission.  She is not eating oxygen supplementation at this time but is tachypneic.  Admission discussed with Dr. Dawna Part AMYAH CLAWSON was evaluated in Emergency Department on 01/28/2020 for the symptoms described in the history of present illness. She was evaluated in the context of the global COVID-19 pandemic, which necessitated consideration that the patient might be at risk for infection with the SARS-CoV-2 virus that causes COVID-19. Institutional protocols and algorithms that pertain to the evaluation of patients at risk for COVID-19 are in a state of rapid change based on information released by regulatory bodies including the  CDC and federal and [employer]. These policies and algorithms were followed during the patient's care in the ED.  Final Clinical Impression(s) / ED Diagnoses Final diagnoses:  Community acquired pneumonia, unspecified laterality  Sepsis with encephalopathy without septic shock, due to unspecified organism [facility])    Rx / DC Orders ED Discharge Orders    None       Irean Kendricks, Annie Main, MD 01/28/20 [phone]

## 2020-01-27 NOTE — Progress Notes (Signed)
Office Visit Note   Patient: Joan Elliott           Date of Birth: 02-Nov-1940           MRN: [id number] Visit Date: 01/27/2020              Requested by: [name], MD [address] [place],  [place] [address] PCP: [name], MD   Assessment & Plan: Visit Diagnoses:  1. Pain in right hip   2. Spondylolisthesis of thoracolumbar region   79 year old female with increasing frequency in urination, back pain with difficulty walking, history of spondylolisthesis. Low grade fever today, history of borderline Diabetes, Stage 3 renal disease. No  Recent laboratory to assess for worsening of diabetes or worsening kidney disease. She is painful but Low grade fever is not likely due to spine but may be a UTI of other source viral or bacterial. Will draw  Lab and recommend that if fever and chills continue she needs a thorough evaluation of renal function and UA and also needs to have assessment for diabetes. A blood glucose to be done today.   Plan: Avoid bending, stooping and avoid lifting weights greater than 10 lbs. Avoid prolong standing and walking. Avoid frequent bending and stooping  No lifting greater than 10 lbs. May use ice or moist heat for pain. Weight loss is of benefit. Handicap license is approved. Low grade fever is not from your back, with chills if this continues to worsen then Go to the emergency room or call Dr. Jeanie Cooks for evaluation.  I will obtaine lab tests to assess for gout or infection.  MRI of the lumbar spine due to worsening standing and walking tolerance. If spinal  Narrowing is worsening and may need to consider intervention. Hydrocodone for pain.  Follow-Up Instructions: Return in about 4 weeks (around 02/24/2020).   Orders:  Orders Placed This Encounter  Procedures  . XR HIP UNILAT W OR W/O PELVIS 2-3 VIEWS RIGHT  . MR Lumbar Spine w/o contrast  . Uric acid  . Sed Rate (ESR)  . CBC  . Basic Metabolic Panel (BMET)   Meds ordered this  encounter  Medications  . methylPREDNISolone (MEDROL DOSEPAK) 4 MG TBPK tablet    Sig: Take as directed, 6 day  Dose pak.    Dispense:  21 tablet    Refill:  0  . HYDROcodone-acetaminophen (NORCO/VICODIN) 5-325 MG tablet    Sig: Take 1 tablet by mouth every 6 (six) hours as needed for moderate pain.    Dispense:  30 tablet    Refill:  0      Procedures: No procedures performed   Clinical Data: No additional findings.   Subjective: Chief Complaint  Patient presents with  . Right Hip - Pain    79 year old female with a 2 week history of back pain with radiation into the right hip and radiates into the right leg with feelings of sharp pain into the right leg and feels hot like it wants to get numb. No bowel or bladder difficulty. Never had pain like this but no back issues. The pain is constant the past two days and she reports she is not able to sleep. There is tingling and numbness into the right leg the whole right leg aches. After 5 minutes the pain improves and then it felt like an oven on the leg and then it went away. She is not able to sleep.   Review of Systems  Constitutional: Positive for activity change (unable to stand or walk. ), fatigue and fever. Negative for appetite change, diaphoresis (chills and fevers) and unexpected weight change.  HENT: Negative for congestion, dental problem, drooling, ear discharge, ear pain, facial swelling, hearing loss, mouth sores, nosebleeds, postnasal drip, rhinorrhea, sinus pressure, sinus pain, sneezing, sore throat, tinnitus, trouble swallowing and voice change.   Eyes: Negative.  Negative for photophobia, pain, discharge, redness, itching and visual disturbance.  Respiratory: Positive for shortness of breath and wheezing. Negative for apnea, cough, choking, chest tightness and stridor.   Cardiovascular: Negative for chest pain, palpitations and leg swelling.  Gastrointestinal: Negative for abdominal distention, abdominal pain, anal  bleeding, blood in stool, constipation, diarrhea, nausea and rectal pain.  Endocrine: Negative.  Negative for cold intolerance, heat intolerance, polydipsia, polyphagia and polyuria.  Genitourinary: Positive for frequency and urgency. Negative for difficulty urinating, dyspareunia, dysuria, enuresis, flank pain, genital sores and hematuria.  Musculoskeletal: Positive for back pain, gait problem and joint swelling. Negative for myalgias, neck pain and neck stiffness.  Skin: Negative.  Negative for color change, pallor, rash and wound.  Allergic/Immunologic: Positive for environmental allergies. Negative for food allergies and immunocompromised state.  Neurological: Positive for weakness, light-headedness, numbness and headaches. Negative for dizziness, tremors, seizures, syncope, facial asymmetry and speech difficulty.  Hematological: Negative.  Negative for adenopathy. Does not bruise/bleed easily.  Psychiatric/Behavioral: Negative.  Negative for agitation, behavioral problems, confusion, decreased concentration, dysphoric mood, hallucinations, self-injury, sleep disturbance and suicidal ideas. The patient is not nervous/anxious and is not hyperactive.      Objective: Vital Signs: BP (!) 171/96   Pulse 95   Temp (!) 101.1 F (38.4 C)   Ht 5' (1.524 m)   Wt 192 lb (87.1 kg)   BMI 37.50 kg/m   Physical Exam Constitutional:      Appearance: She is well-developed.  HENT:     Head: Normocephalic and atraumatic.  Eyes:     Pupils: Pupils are equal, round, and reactive to light.  Pulmonary:     Effort: Pulmonary effort is normal.     Breath sounds: Normal breath sounds.  Abdominal:     General: Bowel sounds are normal.     Palpations: Abdomen is soft.  Musculoskeletal:     Cervical back: Normal range of motion and neck supple.     Lumbar back: Negative right straight leg raise test and negative left straight leg raise test.  Skin:    General: Skin is warm and dry.  Neurological:      Mental Status: She is alert and oriented to person, place, and time.  Psychiatric:        Behavior: Behavior normal.        Thought Content: Thought content normal.        Judgment: Judgment normal.     Back Exam   Tenderness  The patient is experiencing tenderness in the lumbar.  Range of Motion  Extension: abnormal  Flexion: abnormal  Lateral bend right: abnormal  Lateral bend left: abnormal  Rotation left: abnormal   Muscle Strength  Right Quadriceps:  5/5  Left Quadriceps:  5/5  Right Hamstrings:  4/5  Left Hamstrings:  5/5   Tests  Straight leg raise right: negative Straight leg raise left: negative  Reflexes  Patellar: 1/4 Achilles: 1/4  Other  Toe walk: abnormal Heel walk: abnormal Sensation: normal  Comments:  Weak in hip flexion bilaterally, weak in foot DF bilaterally 4/5.       Specialty  Comments:  No specialty comments available.  Imaging: No results found.   PMFS History: Patient Active Problem List   Diagnosis Date Noted  . Dyslipidemia 04/07/2019  . Ileus following gastrointestinal surgery ([place])   . AKI (acute kidney injury) ([place]) 04/05/2018  . Acute perforated appendicitis 04/05/2018  . HTN (hypertension) 04/05/2018  . Aortic atherosclerosis ([place]) 02/01/2018  . Morbid obesity ([place]) 02/01/2018  . CKD (chronic kidney disease) stage 3, GFR 30-59 ml/min 02/01/2018  . Peripheral vascular disease ([place]) 02/01/2018  . Nonspecific chest pain 02/23/2015  . Hypertensive heart disease without CHF   . Coronary artery disease   . Tobacco abuse   . Hypothyroidism   . HLD (hyperlipidemia)   . Gout   . GERD (gastroesophageal reflux disease)    Past Medical History:  Diagnosis Date  . Aortic atherosclerosis ([place]) 02/01/2018  . Arthritis   . COPD (chronic obstructive pulmonary disease) ([place])   . Coronary artery disease    s/p stent mid RCA 2003  . GERD (gastroesophageal reflux disease)   . Gout   . HLD (hyperlipidemia)   . Hypertension     . Hypertensive heart disease without CHF   . Hypothyroidism   . Morbid obesity ([place]) 02/01/2018  . Peripheral vascular disease ([place]) 02/01/2018  . Stage 4 chronic kidney disease ([place]) 02/01/2018  . Stroke ([place])   . Tobacco abuse     Family History  Problem Relation Age of Onset  . Hypertension Mother   . Kidney disease Mother   . Hypertension Brother   . Hypertension Sister     Past Surgical History:  Procedure Laterality Date  . ABDOMINAL HYSTERECTOMY    . LAPAROSCOPY N/A 04/11/2018   Procedure: LAPAROSCOPY DIAGNOSTIC;  Surgeon: Rolm Bookbinder, MD;  Location: [facility];  Service: General;  Laterality: N/A;  . LAPAROTOMY N/A 04/11/2018   Procedure: EXPLORATORY LAPAROTOMY;  Surgeon: Rolm Bookbinder, MD;  Location: [facility];  Service: General;  Laterality: N/A;  . LYSIS OF ADHESION N/A 04/11/2018   Procedure: LYSIS OF ADHESION;  Surgeon: Rolm Bookbinder, MD;  Location: [facility];  Service: General;  Laterality: N/A;  . PARTIAL COLECTOMY N/A 04/11/2018   Procedure: COLECTOMY;  Surgeon: Rolm Bookbinder, MD;  Location: [facility];  Service: General;  Laterality: N/A;  . REPLACEMENT TOTAL KNEE Right   . THYROID SURGERY     Social History   Occupational History  . Not on file  Tobacco Use  . Smoking status: Current Some Day Smoker    Packs/day: 0.50    Types: Cigarettes  . Smokeless tobacco: Never Used  Vaping Use  . Vaping Use: Never used  Substance and Sexual Activity  . Alcohol use: No  . Drug use: No  . Sexual activity: Not on file

## 2020-01-27 NOTE — Addendum Note (Signed)
Addended by: Precious Bard on: 01/27/2020 03:46 PM   Modules accepted: Orders

## 2020-01-27 NOTE — Patient Instructions (Signed)
Avoid bending, stooping and avoid lifting weights greater than 10 lbs. Avoid prolong standing and walking. Avoid frequent bending and stooping  No lifting greater than 10 lbs. May use ice or moist heat for pain. Weight loss is of benefit. Handicap license is approved. Low grade fever is not from your back, with chills if this continues to worsen then Go to the emergency room or call Dr. Jeanie Cooks for evatuation.  I will obtaine lab tests to assess for gout or infection.  MRI of the lumbar spine due to worsening standing and walking tolerance. If spinal  Narrowing is worsening and may need to consider intervention. Hydrocodone for pain.

## 2020-01-28 ENCOUNTER — Emergency Department (HOSPITAL_COMMUNITY): Payer: Medicare HMO

## 2020-01-28 ENCOUNTER — Encounter (HOSPITAL_COMMUNITY): Payer: Self-pay | Admitting: Internal Medicine

## 2020-01-28 DIAGNOSIS — J1282 Pneumonia due to coronavirus disease 2019: Secondary | ICD-10-CM | POA: Diagnosis present

## 2020-01-28 DIAGNOSIS — I1310 Hypertensive heart and chronic kidney disease without heart failure, with stage 1 through stage 4 chronic kidney disease, or unspecified chronic kidney disease: Secondary | ICD-10-CM | POA: Diagnosis present

## 2020-01-28 DIAGNOSIS — E785 Hyperlipidemia, unspecified: Secondary | ICD-10-CM | POA: Diagnosis present

## 2020-01-28 DIAGNOSIS — N179 Acute kidney failure, unspecified: Secondary | ICD-10-CM | POA: Diagnosis present

## 2020-01-28 DIAGNOSIS — Z90710 Acquired absence of both cervix and uterus: Secondary | ICD-10-CM | POA: Diagnosis not present

## 2020-01-28 DIAGNOSIS — M545 Low back pain: Secondary | ICD-10-CM | POA: Diagnosis present

## 2020-01-28 DIAGNOSIS — Z8673 Personal history of transient ischemic attack (TIA), and cerebral infarction without residual deficits: Secondary | ICD-10-CM | POA: Diagnosis not present

## 2020-01-28 DIAGNOSIS — E669 Obesity, unspecified: Secondary | ICD-10-CM | POA: Diagnosis present

## 2020-01-28 DIAGNOSIS — M109 Gout, unspecified: Secondary | ICD-10-CM | POA: Diagnosis present

## 2020-01-28 DIAGNOSIS — Z6837 Body mass index (BMI) 37.0-37.9, adult: Secondary | ICD-10-CM | POA: Diagnosis not present

## 2020-01-28 DIAGNOSIS — D631 Anemia in chronic kidney disease: Secondary | ICD-10-CM | POA: Diagnosis present

## 2020-01-28 DIAGNOSIS — U071 COVID-19: Secondary | ICD-10-CM | POA: Diagnosis present

## 2020-01-28 DIAGNOSIS — I2510 Atherosclerotic heart disease of native coronary artery without angina pectoris: Secondary | ICD-10-CM | POA: Diagnosis present

## 2020-01-28 DIAGNOSIS — E039 Hypothyroidism, unspecified: Secondary | ICD-10-CM | POA: Diagnosis present

## 2020-01-28 DIAGNOSIS — J189 Pneumonia, unspecified organism: Secondary | ICD-10-CM | POA: Diagnosis present

## 2020-01-28 DIAGNOSIS — N1831 Chronic kidney disease, stage 3a: Secondary | ICD-10-CM

## 2020-01-28 DIAGNOSIS — F17210 Nicotine dependence, cigarettes, uncomplicated: Secondary | ICD-10-CM | POA: Diagnosis present

## 2020-01-28 DIAGNOSIS — Z955 Presence of coronary angioplasty implant and graft: Secondary | ICD-10-CM | POA: Diagnosis not present

## 2020-01-28 DIAGNOSIS — G8929 Other chronic pain: Secondary | ICD-10-CM | POA: Diagnosis present

## 2020-01-28 DIAGNOSIS — R5381 Other malaise: Secondary | ICD-10-CM | POA: Diagnosis present

## 2020-01-28 DIAGNOSIS — E038 Other specified hypothyroidism: Secondary | ICD-10-CM | POA: Diagnosis not present

## 2020-01-28 DIAGNOSIS — Z9049 Acquired absence of other specified parts of digestive tract: Secondary | ICD-10-CM | POA: Diagnosis not present

## 2020-01-28 DIAGNOSIS — J440 Chronic obstructive pulmonary disease with acute lower respiratory infection: Secondary | ICD-10-CM | POA: Diagnosis present

## 2020-01-28 DIAGNOSIS — Z7902 Long term (current) use of antithrombotics/antiplatelets: Secondary | ICD-10-CM | POA: Diagnosis not present

## 2020-01-28 DIAGNOSIS — Z96651 Presence of right artificial knee joint: Secondary | ICD-10-CM | POA: Diagnosis present

## 2020-01-28 DIAGNOSIS — K219 Gastro-esophageal reflux disease without esophagitis: Secondary | ICD-10-CM | POA: Diagnosis present

## 2020-01-28 LAB — COMPREHENSIVE METABOLIC PANEL
ALT: 22 U/L (ref 0–44)
ALT: 20 U/L (ref 0–44)
AST: 33 U/L (ref 15–41)
AST: 30 U/L (ref 15–41)
Albumin: 3.3 g/dL — ABNORMAL LOW (ref 3.5–5.0)
Albumin: 2.9 g/dL — ABNORMAL LOW (ref 3.5–5.0)
Alkaline Phosphatase: 92 U/L (ref 38–126)
Alkaline Phosphatase: 78 U/L (ref 38–126)
Anion gap: 13 (ref 5–15)
Anion gap: 11 (ref 5–15)
BUN: 12 mg/dL (ref 8–23)
BUN: 11 mg/dL (ref 8–23)
CO2: 20 mmol/L — ABNORMAL LOW (ref 22–32)
CO2: 24 mmol/L (ref 22–32)
Calcium: 8.5 mg/dL — ABNORMAL LOW (ref 8.9–10.3)
Calcium: 8.5 mg/dL — ABNORMAL LOW (ref 8.9–10.3)
Chloride: 105 mmol/L (ref 98–111)
Chloride: 106 mmol/L (ref 98–111)
Creatinine, Ser: 1.33 mg/dL — ABNORMAL HIGH (ref 0.44–1.00)
Creatinine, Ser: 1.36 mg/dL — ABNORMAL HIGH (ref 0.44–1.00)
GFR calc Af Amer: 44 mL/min — ABNORMAL LOW (ref >60)
GFR calc Af Amer: 42 mL/min — ABNORMAL LOW (ref >60)
GFR calc non Af Amer: 38 mL/min — ABNORMAL LOW (ref >60)
GFR calc non Af Amer: 37 mL/min — ABNORMAL LOW (ref >60)
Glucose, Bld: 88 mg/dL (ref 70–99)
Glucose, Bld: 87 mg/dL (ref 70–99)
Potassium: 3.5 mmol/L (ref 3.5–5.1)
Potassium: 3.9 mmol/L (ref 3.5–5.1)
Sodium: 138 mmol/L (ref 135–145)
Sodium: 141 mmol/L (ref 135–145)
Total Bilirubin: 0.8 mg/dL (ref 0.3–1.2)
Total Bilirubin: 0.7 mg/dL (ref 0.3–1.2)
Total Protein: 7.2 g/dL (ref 6.5–8.1)
Total Protein: 6.6 g/dL (ref 6.5–8.1)

## 2020-01-28 LAB — CBC WITH DIFFERENTIAL/PLATELET
Abs Immature Granulocytes: 0.03 10*3/uL (ref 0.00–0.07)
Basophils Absolute: 0 10*3/uL (ref 0.0–0.1)
Basophils Relative: 0 %
Eosinophils Absolute: 0 10*3/uL (ref 0.0–0.5)
Eosinophils Relative: 0 %
HCT: 36.4 % (ref 36.0–46.0)
Hemoglobin: 11.8 g/dL — ABNORMAL LOW (ref 12.0–15.0)
Immature Granulocytes: 1 %
Lymphocytes Relative: 19 %
Lymphs Abs: 0.7 10*3/uL (ref 0.7–4.0)
MCH: 25.3 pg — ABNORMAL LOW (ref 26.0–34.0)
MCHC: 32.4 g/dL (ref 30.0–36.0)
MCV: 77.9 fL — ABNORMAL LOW (ref 80.0–100.0)
Monocytes Absolute: 0.3 10*3/uL (ref 0.1–1.0)
Monocytes Relative: 8 %
Neutro Abs: 2.7 10*3/uL (ref 1.7–7.7)
Neutrophils Relative %: 72 %
Platelets: 207 10*3/uL (ref 150–400)
RBC: 4.67 MIL/uL (ref 3.87–5.11)
RDW: 18.4 % — ABNORMAL HIGH (ref 11.5–15.5)
WBC: 3.8 10*3/uL — ABNORMAL LOW (ref 4.0–10.5)
nRBC: 0 % (ref 0.0–0.2)

## 2020-01-28 LAB — TROPONIN I (HIGH SENSITIVITY)
Troponin I (High Sensitivity): 60 ng/L — ABNORMAL HIGH (ref <18)
Troponin I (High Sensitivity): 65 ng/L — ABNORMAL HIGH (ref <18)
Troponin I (High Sensitivity): 60 ng/L — ABNORMAL HIGH (ref <18)
Troponin I (High Sensitivity): 59 ng/L — ABNORMAL HIGH (ref <18)

## 2020-01-28 LAB — URINALYSIS, ROUTINE W REFLEX MICROSCOPIC
Bilirubin Urine: NEGATIVE
Glucose, UA: NEGATIVE mg/dL
Ketones, ur: NEGATIVE mg/dL
Leukocytes,Ua: NEGATIVE
Nitrite: NEGATIVE
Protein, ur: 100 mg/dL — AB
Specific Gravity, Urine: 1.01 (ref 1.005–1.030)
pH: 6 (ref 5.0–8.0)

## 2020-01-28 LAB — CREATININE, SERUM
Creatinine, Ser: 1.22 mg/dL — ABNORMAL HIGH (ref 0.44–1.00)
GFR calc Af Amer: 48 mL/min — ABNORMAL LOW (ref >60)
GFR calc non Af Amer: 42 mL/min — ABNORMAL LOW (ref >60)

## 2020-01-28 LAB — SARS CORONAVIRUS 2 BY RT PCR (HOSPITAL ORDER, PERFORMED IN ~~LOC~~ HOSPITAL LAB): SARS Coronavirus 2: POSITIVE — AB

## 2020-01-28 LAB — CBC
HCT: 36 % (ref 36.0–46.0)
Hemoglobin: 11.5 g/dL — ABNORMAL LOW (ref 12.0–15.0)
MCH: 25.2 pg — ABNORMAL LOW (ref 26.0–34.0)
MCHC: 31.9 g/dL (ref 30.0–36.0)
MCV: 78.9 fL — ABNORMAL LOW (ref 80.0–100.0)
Platelets: 249 10*3/uL (ref 150–400)
RBC: 4.56 MIL/uL (ref 3.87–5.11)
RDW: 18.5 % — ABNORMAL HIGH (ref 11.5–15.5)
WBC: 2.8 10*3/uL — ABNORMAL LOW (ref 4.0–10.5)
nRBC: 0 % (ref 0.0–0.2)

## 2020-01-28 LAB — LACTIC ACID, PLASMA
Lactic Acid, Venous: 1.3 mmol/L (ref 0.5–1.9)
Lactic Acid, Venous: 1.3 mmol/L (ref 0.5–1.9)

## 2020-01-28 LAB — C-REACTIVE PROTEIN
CRP: 9.5 mg/dL — ABNORMAL HIGH (ref <1.0)
CRP: 10.8 mg/dL — ABNORMAL HIGH (ref <1.0)

## 2020-01-28 LAB — D-DIMER, QUANTITATIVE
D-Dimer, Quant: 2.34 ug/mL-FEU — ABNORMAL HIGH (ref 0.00–0.50)
D-Dimer, Quant: 2.54 ug/mL-FEU — ABNORMAL HIGH (ref 0.00–0.50)

## 2020-01-28 LAB — PROCALCITONIN: Procalcitonin: <0.1 ng/mL

## 2020-01-28 LAB — LIPASE, BLOOD: Lipase: 21 U/L (ref 11–51)

## 2020-01-28 MED ORDER — SODIUM CHLORIDE 0.9 % IV SOLN
100.0000 mg | Freq: Every day | INTRAVENOUS | Status: AC
  Administered 2020-01-29 – 2020-02-01 (×4): 100 mg via INTRAVENOUS
  Filled 2020-01-28 (×4): qty 20

## 2020-01-28 MED ORDER — ASCORBIC ACID 500 MG PO TABS
500.0000 mg | ORAL_TABLET | Freq: Every day | ORAL | Status: DC
  Administered 2020-01-28 – 2020-02-01 (×5): 500 mg via ORAL
  Filled 2020-01-28 (×5): qty 1

## 2020-01-28 MED ORDER — METOPROLOL TARTRATE 12.5 MG HALF TABLET
12.5000 mg | ORAL_TABLET | Freq: Two times a day (BID) | ORAL | Status: DC
  Administered 2020-01-28 – 2020-02-01 (×7): 12.5 mg via ORAL
  Filled 2020-01-28 (×8): qty 1

## 2020-01-28 MED ORDER — ENOXAPARIN SODIUM 40 MG/0.4ML ~~LOC~~ SOLN
40.0000 mg | SUBCUTANEOUS | Status: DC
  Administered 2020-01-28 – 2020-01-31 (×4): 40 mg via SUBCUTANEOUS
  Filled 2020-01-28 (×5): qty 0.4

## 2020-01-28 MED ORDER — ZINC SULFATE 220 (50 ZN) MG PO CAPS
220.0000 mg | ORAL_CAPSULE | Freq: Every day | ORAL | Status: DC
  Administered 2020-01-28 – 2020-02-01 (×5): 220 mg via ORAL
  Filled 2020-01-28 (×5): qty 1

## 2020-01-28 MED ORDER — HYDROCODONE-ACETAMINOPHEN 5-325 MG PO TABS
1.0000 | ORAL_TABLET | Freq: Four times a day (QID) | ORAL | Status: DC | PRN
  Administered 2020-01-29 – 2020-01-30 (×2): 1 via ORAL
  Filled 2020-01-28 (×3): qty 1

## 2020-01-28 MED ORDER — GABAPENTIN 100 MG PO CAPS
100.0000 mg | ORAL_CAPSULE | Freq: Every day | ORAL | Status: DC
  Administered 2020-01-28 – 2020-01-31 (×4): 100 mg via ORAL
  Filled 2020-01-28 (×5): qty 1

## 2020-01-28 MED ORDER — LEVOTHYROXINE SODIUM 50 MCG PO TABS
50.0000 ug | ORAL_TABLET | Freq: Every day | ORAL | Status: DC
  Administered 2020-01-28 – 2020-02-01 (×5): 50 ug via ORAL
  Filled 2020-01-28 (×5): qty 1

## 2020-01-28 MED ORDER — CLOPIDOGREL BISULFATE 75 MG PO TABS
75.0000 mg | ORAL_TABLET | Freq: Every day | ORAL | Status: DC
  Administered 2020-01-28 – 2020-02-01 (×5): 75 mg via ORAL
  Filled 2020-01-28 (×5): qty 1

## 2020-01-28 MED ORDER — PANTOPRAZOLE SODIUM 40 MG PO TBEC
40.0000 mg | DELAYED_RELEASE_TABLET | Freq: Two times a day (BID) | ORAL | Status: DC
  Administered 2020-01-28 – 2020-02-01 (×9): 40 mg via ORAL
  Filled 2020-01-28 (×9): qty 1

## 2020-01-28 MED ORDER — NITROGLYCERIN 0.4 MG SL SUBL: 0.4000 mg | SUBLINGUAL_TABLET | SUBLINGUAL | Status: DC | PRN

## 2020-01-28 MED ORDER — DOCUSATE SODIUM 100 MG PO CAPS
100.0000 mg | ORAL_CAPSULE | Freq: Two times a day (BID) | ORAL | Status: DC
  Administered 2020-01-28 – 2020-02-01 (×8): 100 mg via ORAL
  Filled 2020-01-28 (×9): qty 1

## 2020-01-28 MED ORDER — ALLOPURINOL 100 MG PO TABS
100.0000 mg | ORAL_TABLET | Freq: Every day | ORAL | Status: DC
  Administered 2020-01-29 – 2020-02-01 (×4): 100 mg via ORAL
  Filled 2020-01-28 (×6): qty 1

## 2020-01-28 MED ORDER — ACETAMINOPHEN 325 MG PO TABS
650.0000 mg | ORAL_TABLET | Freq: Once | ORAL | Status: AC
  Administered 2020-01-28: 650 mg via ORAL
  Filled 2020-01-28: qty 2

## 2020-01-28 MED ORDER — GUAIFENESIN-DM 100-10 MG/5ML PO SYRP
10.0000 mL | ORAL_SOLUTION | ORAL | Status: DC | PRN
  Administered 2020-01-30: 10 mL via ORAL
  Filled 2020-01-28: qty 10

## 2020-01-28 MED ORDER — ONDANSETRON HCL 4 MG/2ML IJ SOLN: 4.0000 mg | Freq: Four times a day (QID) | INTRAMUSCULAR | Status: DC | PRN

## 2020-01-28 MED ORDER — SODIUM CHLORIDE 0.9 % IV SOLN
200.0000 mg | Freq: Once | INTRAVENOUS | Status: AC
  Administered 2020-01-28: 200 mg via INTRAVENOUS
  Filled 2020-01-28: qty 40

## 2020-01-28 MED ORDER — ISOSORBIDE MONONITRATE ER 30 MG PO TB24
30.0000 mg | ORAL_TABLET | Freq: Every day | ORAL | Status: DC
  Administered 2020-01-28 – 2020-02-01 (×5): 30 mg via ORAL
  Filled 2020-01-28 (×5): qty 1

## 2020-01-28 MED ORDER — ONDANSETRON HCL 4 MG PO TABS
4.0000 mg | ORAL_TABLET | Freq: Four times a day (QID) | ORAL | Status: DC | PRN
  Filled 2020-01-28: qty 1

## 2020-01-28 MED ORDER — ROSUVASTATIN CALCIUM 20 MG PO TABS
40.0000 mg | ORAL_TABLET | Freq: Every day | ORAL | Status: DC
  Administered 2020-01-28 – 2020-02-01 (×5): 40 mg via ORAL
  Filled 2020-01-28 (×4): qty 2
  Filled 2020-01-28: qty 8
  Filled 2020-01-28: qty 2

## 2020-01-28 MED ORDER — LACTATED RINGERS IV BOLUS
1000.0000 mL | Freq: Once | INTRAVENOUS | Status: AC
  Administered 2020-01-28: 1000 mL via INTRAVENOUS

## 2020-01-28 MED ORDER — OXYBUTYNIN CHLORIDE 5 MG PO TABS
5.0000 mg | ORAL_TABLET | Freq: Two times a day (BID) | ORAL | Status: DC
  Administered 2020-01-28 – 2020-02-01 (×9): 5 mg via ORAL
  Filled 2020-01-28 (×11): qty 1

## 2020-01-28 MED ORDER — DEXAMETHASONE 6 MG PO TABS
6.0000 mg | ORAL_TABLET | Freq: Every day | ORAL | Status: DC
  Administered 2020-01-28 – 2020-01-30 (×3): 6 mg via ORAL
  Filled 2020-01-28: qty 2
  Filled 2020-01-28: qty 1
  Filled 2020-01-28: qty 2

## 2020-01-28 NOTE — Progress Notes (Signed)
PROGRESS NOTE                                                                                                                                                                                                             Patient Demographics:    Joan Elliott, is a 79 y.o. female, DOB - 1940-08-28, CBU:[id number]  Outpatient Primary MD for the patient is [name], MD   Admit date - 01/27/2020   LOS - 0  Chief Complaint  Patient presents with  . Fatigue  . Nausea  . Emesis       Brief Narrative: Patient is a 79 y.o. female with PMHx of CAD s/p PCI 2003, CKD stage IIIa, HTN, tobacco abuse, HLD, hypothyroidism who presented with weakness, fatigue and shortness of breath along with fever-found to have COVID-19 pneumonia and admitted to the hospitalist service.  See below for further details.  Significant Events: 7/26>> admit to [facility]-for weakness/fever-found to have COVID-19 pneumonia  Significant studies: 7/26>> chest x-ray: Patchy left greater than right basilar opacity 7/27>> CT abdomen/pelvis: Groundglass opacities seen at both lung bases.  No acute intra abdominal or pelvic pathology.  COVID-19 medications: Remdesivir: 7/26>>  Antibiotics: None  Microbiology data: 7/26>> blood culture: Pending 7/27>> blood culture: Pending 7/27>> urine culture: Pending  Procedures: None  Consults: None  DVT prophylaxis: enoxaparin (LOVENOX) injection 40 mg Start: 01/28/20 1400    Subjective:    Joan Elliott today feels very weak-she had for fever earlier this morning.  Continues to cough-not on oxygen.   Assessment  & Plan :   Covid 19 Viral pneumonia: Due to advanced age-multiple medical comorbidities-remains at risk for severe disease-thankfully not hypoxic-continue remdesivir-if develops hypoxemia-add steroids.  Prudent to continue hospitalization and Remdesivir given risk for severe disease.  Fever:  afebrile O2 requirements:  SpO2: 98 %  COVID-19 Labs: Recent Labs    01/28/20 0453 01/28/20 0841  DDIMER 2.34* 2.54*  CRP 9.5* 10.8*       Component Value Date/Time   BNP 48.1 02/23/2015 1941    Recent Labs  Lab 01/28/20 0841  PROCALCITON <0.10    Lab Results  Component Value Date   SARSCOV2NAA POSITIVE (A) 01/28/2020     Prone/Incentive Spirometry: encouragedincentive spirometry use 3-4/hour.  CKD stage IIIa: Creatinine close to baseline-follow periodically.  CAD s/p PCI 2003: No anginal symptoms-continue  Plavix, Imdur, beta-blocker and statin.  Anemia: Stable-likely from CKD-stable for follow-up.  Hypothyroidism: Continue Synthroid  Tobacco abuse: Counseled  Chronic low back pain: Followed by orthopedics-nonfocal exam-we will need MRI at some point-suspect will be done in the outpatient setting.   Obesity: Estimated body mass index is 37.5 kg/m as calculated from the following:   Height as of an earlier encounter on 01/27/20: 5' (1.524 m).   Weight as of an earlier encounter on 01/27/20: 87.1 kg.   ABG:    Component Value Date/Time   TCO2 25 03/11/2017 1545    Vent Settings: N/A  Condition - Stable  Family Communication  :  Sandria Senter voicemail at [phone]  Code Status :  Full Code  Diet :  Diet Order            Diet Heart Room service appropriate? Yes; Fluid consistency: Thin  Diet effective now                  Disposition Plan  :   Status is: Inpatient  Remains inpatient appropriate because:Inpatient level of care appropriate due to severity of illness   Dispo: The patient is from: Home              Anticipated d/c is to: Home              Anticipated d/c date is: 3 days              Patient currently is not medically stable to d/c.   Barriers to discharge: Hypoxia requiring O2 supplementation/complete 5 days of IV Remdesivir  Antimicorbials  :    Anti-infectives (From admission, onward)   Start     Dose/Rate  Route Frequency Ordered Stop   01/29/20 1000  remdesivir 100 mg in sodium chloride 0.9 % 100 mL IVPB     Discontinue    "Followed by" Linked Group Details   100 mg 200 mL/hr over 30 Minutes Intravenous Daily 01/28/20 0456 02/02/20 0959   01/28/20 0600  remdesivir 200 mg in sodium chloride 0.9% 250 mL IVPB       "Followed by" Linked Group Details   200 mg 580 mL/hr over 30 Minutes Intravenous Once 01/28/20 0456 01/28/20 0720   01/27/20 2330  azithromycin (ZITHROMAX) 500 mg in sodium chloride 0.9 % 250 mL IVPB        500 mg 250 mL/hr over 60 Minutes Intravenous  Once 01/27/20 2327 01/28/20 0241   01/27/20 2315  cefTRIAXone (ROCEPHIN) 1 g in sodium chloride 0.9 % 100 mL IVPB  Status:  Discontinued        1 g 200 mL/hr over 30 Minutes Intravenous Every 24 hours 01/27/20 2308 01/28/20 0453      Inpatient Medications  Scheduled Meds: . allopurinol  100 mg Oral Daily  . vitamin C  500 mg Oral Daily  . clopidogrel  75 mg Oral Daily  . docusate sodium  100 mg Oral BID  . enoxaparin (LOVENOX) injection  40 mg Subcutaneous Q24H  . gabapentin  100 mg Oral QHS  . isosorbide mononitrate  30 mg Oral Daily  . levothyroxine  50 mcg Oral Q0600  . metoprolol tartrate  12.5 mg Oral BID  . oxybutynin  5 mg Oral BID  . pantoprazole  40 mg Oral BID  . rosuvastatin  40 mg Oral Daily  . zinc sulfate  220 mg Oral Daily   Continuous Infusions: . [START ON 01/29/2020] remdesivir 100 mg in NS 100 mL  PRN Meds:.guaiFENesin-dextromethorphan, HYDROcodone-acetaminophen, nitroGLYCERIN, ondansetron **OR** ondansetron (ZOFRAN) IV   Time Spent in minutes  25   See all Orders from today for further details   Oren Binet M.D on 01/28/2020 at 3:07 PM  To page go to [email or web address] - use universal password  [facility] -  Office  [phone]    Objective:   Vitals:   01/28/20 0700 01/28/20 0800 01/28/20 1016 01/28/20 1200  BP: (!) 155/98  (!) 164/115 (!) 129/76  Pulse: 62  66 62  Resp:  23  13 22   Temp:  98.6 F (37 C)    TempSrc:  Oral    SpO2: 99%  98% 98%    Wt Readings from Last 3 Encounters:  01/27/20 87.1 kg  04/08/19 87.1 kg  04/16/18 93.4 kg     Intake/Output Summary (Last 24 hours) at 01/28/2020 1507 Last data filed at 01/28/2020 0330 Gross per 24 hour  Intake 2350 ml  Output --  Net 2350 ml     Physical Exam Gen Exam:Alert awake-not in any distress HEENT:atraumatic, normocephalic Chest: B/L clear to auscultation anteriorly CVS:S1S2 regular Abdomen:soft non tender, non distended Extremities:no edema Neurology: Non focal Skin: no rash   Data Review:    CBC Recent Labs  Lab 01/27/20 1628 01/27/20 2301 01/28/20 0841  WBC 4.2 3.8* 2.8*  HGB 12.1 11.8* 11.5*  HCT 38.8 36.4 36.0  PLT 249 207 249  MCV 79.7* 77.9* 78.9*  MCH 24.8* 25.3* 25.2*  MCHC 31.2* 32.4 31.9  RDW 18.3* 18.4* 18.5*  LYMPHSABS  --  0.7  --   MONOABS  --  0.3  --   EOSABS  --  0.0  --   BASOSABS  --  0.0  --     Chemistries  Recent Labs  Lab 01/27/20 1628 01/27/20 2301 01/28/20 0453 01/28/20 0841  NA 139 138  --  141  K 3.9 3.5  --  3.9  CL 104 105  --  106  CO2 23 20*  --  24  GLUCOSE 88 88  --  87  BUN 16 12  --  11  CREATININE 1.43* 1.33* 1.22* 1.36*  CALCIUM 8.8 8.5*  --  8.5*  AST  --  33  --  30  ALT  --  22  --  20  ALKPHOS  --  92  --  78  BILITOT  --  0.8  --  0.7   ------------------------------------------------------------------------------------------------------------------ No results for input(s): CHOL, HDL, LDLCALC, TRIG, CHOLHDL, LDLDIRECT in the last 72 hours.  Lab Results  Component Value Date   HGBA1C 6.3 (H) 02/23/2015   ------------------------------------------------------------------------------------------------------------------ No results for input(s): TSH, T4TOTAL, T3FREE, THYROIDAB in the last 72 hours.  Invalid input(s):  FREET3 ------------------------------------------------------------------------------------------------------------------ No results for input(s): VITAMINB12, FOLATE, FERRITIN, TIBC, IRON, RETICCTPCT in the last 72 hours.  Coagulation profile Recent Labs  Lab 01/27/20 2301  INR 1.0    Recent Labs    01/28/20 0453 01/28/20 0841  DDIMER 2.34* 2.54*    Cardiac Enzymes No results for input(s): CKMB, TROPONINI, MYOGLOBIN in the last 168 hours.  Invalid input(s): CK ------------------------------------------------------------------------------------------------------------------    Component Value Date/Time   BNP 48.1 02/23/2015 1941    Micro Results Recent Results (from the past 240 hour(s))  SARS Coronavirus 2 by RT PCR (hospital order, performed in [facility] hospital lab) Nasopharyngeal Nasopharyngeal Swab     Status: Abnormal   Collection Time: 01/28/20 12:24 AM   Specimen: Nasopharyngeal Swab  Result Value Ref Range  Status   SARS Coronavirus 2 POSITIVE (A) NEGATIVE Final    Comment: RESULT CALLED TO, READ BACK BY AND VERIFIED WITH: RN HARRIS C. AT [facility] 01/28/2020 (NOTE) SARS-CoV-2 target nucleic acids are DETECTED  SARS-CoV-2 RNA is generally detectable in upper respiratory specimens  during the acute phase of infection.  Positive results are indicative  of the presence of the identified virus, but do not rule out bacterial infection or co-infection with other pathogens not detected by the test.  Clinical correlation with patient history and  other diagnostic information is necessary to determine patient infection status.  The expected result is negative.  Fact Sheet for Patients:   StrictlyIdeas.no   Fact Sheet for Healthcare Providers:   BankingDealers.co.za    This test is not yet approved or cleared by the [place] FDA and  has been authorized for detection and/or diagnosis of SARS-CoV-2  by FDA under an Emergency Use Authorization (EUA).  This EUA will remain in effect (me aning this test can be used) for the duration of  the COVID-19 declaration under Section 564(b)(1) of the Act, 21 U.S.C. section 360-bbb-3(b)(1), unless the authorization is terminated or revoked sooner.  Performed at [facility], [place] [address] [place], [place] [address]     Radiology Reports CT ABDOMEN PELVIS WO CONTRAST  Result Date: 01/28/2020 CLINICAL DATA:  Sepsis, weakness fatigue vomiting EXAM: CT ABDOMEN AND PELVIS WITHOUT CONTRAST TECHNIQUE: Multidetector CT imaging of the abdomen and pelvis was performed following the standard protocol without IV contrast. COMPARISON:  None. FINDINGS: Lower chest: The visualized heart size within normal limits. No pericardial fluid/thickening. There is a small hiatal hernia present. Ground-glass opacities are seen at both lung bases, predominantly within the periphery. Hepatobiliary: The liver is normal in density without focal abnormality.The main portal vein is patent. The patient is status post cholecystectomy. No biliary ductal dilation. Pancreas: Unremarkable. No pancreatic ductal dilatation or surrounding inflammatory changes. Spleen: Normal in size without focal abnormality. Adrenals/Urinary Tract: Both adrenal glands appear normal. The kidneys and collecting system appear normal without evidence of urinary tract calculus or hydronephrosis. Bladder is unremarkable. Stomach/Bowel: The stomach, small bowel, and colon are normal in appearance. No inflammatory changes, wall thickening, or obstructive findings. The patient has had a prior right hemicolectomy and appendectomy. Vascular/Lymphatic: There are no enlarged mesenteric, retroperitoneal, or pelvic lymph nodes. Scattered aortic atherosclerotic calcifications are seen without aneurysmal dilatation. Reproductive: The patient is status post hysterectomy. No adnexal masses or collections seen. Other: There is  a small abdominal wall hernia containing the tiny loop of bowel. No evidence of bowel strangulation is seen. Within the right lower quadrant there is a loculated area of fat with fat stranding changes measuring approximately 3.2 cm which could be a prior area of omental infarct/fat necrosis. Musculoskeletal: No acute or significant osseous findings. Degenerative changes seen in the thoracolumbar spine. IMPRESSION: Ground-glass opacities seen at both lung bases which may be due to inflammatory or early infectious etiology. No acute intra-abdominal or pelvic pathology to explain the patient's symptoms. Aortic Atherosclerosis (ICD10-I70.0). Electronically Signed   By: Prudencio Pair M.D.   On: 01/28/2020 02:20   DG Chest Port 1 View  Result Date: 01/27/2020 CLINICAL DATA:  Sepsis.  Nausea and fatigue. EXAM: PORTABLE CHEST 1 VIEW COMPARISON:  04/11/2018 FINDINGS: Lung volumes are low. Normal heart size with mild aortic tortuosity and atherosclerosis. Minimal patchy opacity at the left greater than right lung base. No pulmonary edema. No pleural fluid  or pneumothorax. Degenerative change in the spine. No acute osseous abnormalities are seen. IMPRESSION: Low lung volumes with patchy left greater than right basilar opacity, favor atelectasis. Pneumonia could have a similar appearance in the appropriate clinical setting. Electronically Signed   By: Keith Rake M.D.   On: 01/27/2020 23:24

## 2020-01-28 NOTE — ED Notes (Signed)
Pt assisted x1 to bedside commode, pt has dyspnea with exertion, SPO2 remains 95% with exertion on RA. Pt able to quickly slow breathing when returning to comfortable position in bed.

## 2020-01-28 NOTE — H&P (Signed)
History and Physical    Joan Elliott JSH:[id number] DOB: 05/08/1941 DOA: 01/27/2020  PCP: [name], MD  Patient coming from: Home.  Chief Complaint: Shortness of breath weakness and nausea.  HPI: Joan Elliott is a 79 y.o. female with history of CAD status post stenting in 2003, chronic kidney disease stage III, hypertension, morbid obesity ongoing tobacco abuse dyslipidemia hypothyroidism has been experiencing increasing weakness and nausea fatigue and shortness of breath for the last 4 to 5 days.  Patient over the last 1 month has developed low back pain and had followed up with Dr. Louanne Skye yesterday and at Dr. Otho Ket office patient was found to be febrile was referred to the ER if it gets worse.  Since patient's fatigue and shortness of breath was worsening patient presents to the ER.  ED Course: In the ER patient had a fever of 101.1 F mildly tachycardic.  Chest x-ray shows bilateral infiltrates.  Since patient had some nausea and back pain CT abdomen pelvis was done which shows bilateral groundglass opacities.  Covid test came back positive.  Labs are significant for creatinine 1.3 which is at baseline bicarb of 20 hemoglobin 1.8 WBC 3.8.  High sensitive troponin was around 60.  EKG shows normal sinus rhythm.  Given the generalized debility and multiple comorbidities patient admitted for treating pneumonia due to Covid infection.  Review of Systems: As per HPI, rest all negative.   Past Medical History:  Diagnosis Date  . Aortic atherosclerosis ([place]) 02/01/2018  . Arthritis   . COPD (chronic obstructive pulmonary disease) ([place])   . Coronary artery disease    s/p stent mid RCA 2003  . GERD (gastroesophageal reflux disease)   . Gout   . HLD (hyperlipidemia)   . Hypertension   . Hypertensive heart disease without CHF   . Hypothyroidism   . Morbid obesity ([place]) 02/01/2018  . Peripheral vascular disease ([place]) 02/01/2018  . Stage 4 chronic kidney disease ([place]) 02/01/2018  . Stroke  ([place])   . Tobacco abuse     Past Surgical History:  Procedure Laterality Date  . ABDOMINAL HYSTERECTOMY    . LAPAROSCOPY N/A 04/11/2018   Procedure: LAPAROSCOPY DIAGNOSTIC;  Surgeon: Rolm Bookbinder, MD;  Location: [facility];  Service: General;  Laterality: N/A;  . LAPAROTOMY N/A 04/11/2018   Procedure: EXPLORATORY LAPAROTOMY;  Surgeon: Rolm Bookbinder, MD;  Location: [facility];  Service: General;  Laterality: N/A;  . LYSIS OF ADHESION N/A 04/11/2018   Procedure: LYSIS OF ADHESION;  Surgeon: Rolm Bookbinder, MD;  Location: [facility];  Service: General;  Laterality: N/A;  . PARTIAL COLECTOMY N/A 04/11/2018   Procedure: COLECTOMY;  Surgeon: Rolm Bookbinder, MD;  Location: [facility];  Service: General;  Laterality: N/A;  . REPLACEMENT TOTAL KNEE Right   . THYROID SURGERY       reports that she has been smoking cigarettes. She has been smoking about 0.50 packs per day. She has never used smokeless tobacco. She reports that she does not drink alcohol and does not use drugs.  Allergies  Allergen Reactions  . Ivp Dye [Iodinated Diagnostic Agents] Nausea And Vomiting  . Oxycodone Itching  . Penicillins Rash    Has patient had a PCN reaction causing immediate rash, facial/tongue/throat swelling, SOB or lightheadedness with hypotension: Yes Has patient had a PCN reaction causing severe rash involving mucus membranes or skin necrosis: No Has patient had a PCN reaction that required hospitalization No Has patient had a PCN reaction occurring within the last 10  years: No If all of the above answers are "NO", then may proceed with Cephalosporin use.     Family History  Problem Relation Age of Onset  . Hypertension Mother   . Kidney disease Mother   . Hypertension Brother   . Hypertension Sister     Prior to Admission medications   Medication Sig Start Date End Date Taking? Authorizing Provider  acetaminophen (TYLENOL) 325 MG tablet Take 2 tablets (650 mg total) by mouth every 6 (six) hours as  needed for mild pain or fever. 04/24/18   Hongalgi, Lenis Dickinson, MD  allopurinol (ZYLOPRIM) 100 MG tablet Take 1 tablet (100 mg total) by mouth daily. 02/02/18   Nita Sells, MD  clopidogrel (PLAVIX) 75 MG tablet Take 1 tablet (75 mg total) by mouth daily. 02/02/18   Nita Sells, MD  diclofenac sodium (VOLTAREN) 1 % GEL Apply 4 g topically 4 (four) times daily as needed for pain. 03/20/18   [provider]  docusate sodium (COLACE) 100 MG capsule Take 1 capsule (100 mg total) by mouth 2 (two) times daily. 04/24/18   Hongalgi, Lenis Dickinson, MD  gabapentin (NEURONTIN) 100 MG capsule TAKE 1 CAPSULE(100 MG) BY MOUTH AT BEDTIME Patient taking differently: Take 100 mg by mouth at bedtime.  02/02/18   Nita Sells, MD  HYDROcodone-acetaminophen (NORCO/VICODIN) 5-325 MG tablet Take 1 tablet by mouth every 6 (six) hours as needed for moderate pain. 01/27/20   [name], MD  isosorbide mononitrate (IMDUR) 30 MG 24 hr tablet Take 1 tablet (30 mg total) by mouth daily. 07/10/19 10/08/19  Minus Breeding, MD  levothyroxine (SYNTHROID, LEVOTHROID) 50 MCG tablet Take 1 tablet (50 mcg total) by mouth daily. 02/02/18   Nita Sells, MD  methylPREDNISolone (MEDROL DOSEPAK) 4 MG TBPK tablet Take as directed, 6 day  Dose pak. 01/27/20   [name], MD  metoprolol tartrate (LOPRESSOR) 25 MG tablet Take 0.5 tablets (12.5 mg total) by mouth 2 (two) times daily. 02/02/18   Nita Sells, MD  Multiple Vitamin (MULTIVITAMIN WITH MINERALS) TABS tablet Take 1 tablet by mouth daily. 04/25/18   Hongalgi, Lenis Dickinson, MD  nitroGLYCERIN (NITROSTAT) 0.4 MG SL tablet Place 1 tablet (0.4 mg total) under the tongue every 5 (five) minutes as needed for chest pain. 04/08/19 07/07/19  Minus Breeding, MD  oxybutynin (DITROPAN) 5 MG tablet Take 1 tablet (5 mg total) by mouth 2 (two) times daily. 02/02/18   Nita Sells, MD  pantoprazole (PROTONIX) 40 MG tablet Take 1 tablet (40 mg total) by mouth 2 (two)  times daily. 02/02/18   Nita Sells, MD  rosuvastatin (CRESTOR) 40 MG tablet Take 1 tablet (40 mg total) by mouth daily. 07/10/19 10/08/19  Minus Breeding, MD  tiZANidine (ZANAFLEX) 4 MG tablet Take 4 mg by mouth 2 (two) times daily as needed for muscle spasms.    [provider]  traMADol (ULTRAM) 50 MG tablet Take 1 tablet (50 mg total) by mouth every 6 (six) hours as needed for moderate pain or severe pain. 04/24/18   Modena Jansky, MD    Physical Exam: Constitutional: Moderately built and nourished. Vitals:   01/28/20 0139 01/28/20 0235 01/28/20 0333 01/28/20 0400  BP: (!) 145/89 (!) 106/90 (!) 154/92 (!) 133/95  Pulse: 68 91 (!) 28 72  Resp: (!) 24 23 (!) 26 22  Temp:      TempSrc:      SpO2: 98% 99% 91% 97%   Eyes: Anicteric no pallor. ENMT: No discharge from  the ears eyes nose or mouth. Neck: No mass felt.  No neck rigidity. Respiratory: No rhonchi or crepitations. Cardiovascular: S1-S2 heard. Abdomen: Soft nontender bowel sounds present. Musculoskeletal: No edema. Skin: No rash. Neurologic: Alert awake oriented to time place and person.  Moves all extremities. Psychiatric: Appears normal per normal affect.   Labs on Admission: I have personally reviewed following labs and imaging studies  CBC: Recent Labs  Lab 01/27/20 1628 01/27/20 2301  WBC 4.2 3.8*  NEUTROABS  --  2.7  HGB 12.1 11.8*  HCT 38.8 36.4  MCV 79.7* 77.9*  PLT 249 825   Basic Metabolic Panel: Recent Labs  Lab 01/27/20 1628 01/27/20 2301  NA 139 138  K 3.9 3.5  CL 104 105  CO2 23 20*  GLUCOSE 88 88  BUN 16 12  CREATININE 1.43* 1.33*  CALCIUM 8.8 8.5*   GFR: Estimated Creatinine Clearance: 33.1 mL/min (A) (by C-G formula based on SCr of 1.33 mg/dL (H)). Liver Function Tests: Recent Labs  Lab 01/27/20 2301  AST 33  ALT 22  ALKPHOS 92  BILITOT 0.8  PROT 7.2  ALBUMIN 3.3*   Recent Labs  Lab 01/27/20 2301  LIPASE 21   No results for input(s): AMMONIA in the  last 168 hours. Coagulation Profile: Recent Labs  Lab 01/27/20 2301  INR 1.0   Cardiac Enzymes: No results for input(s): CKTOTAL, CKMB, CKMBINDEX, TROPONINI in the last 168 hours. BNP (last 3 results) No results for input(s): PROBNP in the last 8760 hours. HbA1C: No results for input(s): HGBA1C in the last 72 hours. CBG: No results for input(s): GLUCAP in the last 168 hours. Lipid Profile: No results for input(s): CHOL, HDL, LDLCALC, TRIG, CHOLHDL, LDLDIRECT in the last 72 hours. Thyroid Function Tests: No results for input(s): TSH, T4TOTAL, FREET4, T3FREE, THYROIDAB in the last 72 hours. Anemia Panel: No results for input(s): VITAMINB12, FOLATE, FERRITIN, TIBC, IRON, RETICCTPCT in the last 72 hours. Urine analysis:    Component Value Date/Time   COLORURINE YELLOW 01/28/2020 0024   APPEARANCEUR CLEAR 01/28/2020 0024   LABSPEC 1.010 01/28/2020 0024   PHURINE 6.0 01/28/2020 0024   GLUCOSEU NEGATIVE 01/28/2020 0024   HGBUR MODERATE (A) 01/28/2020 0024   BILIRUBINUR NEGATIVE 01/28/2020 0024   KETONESUR NEGATIVE 01/28/2020 0024   PROTEINUR 100 (A) 01/28/2020 0024   NITRITE NEGATIVE 01/28/2020 0024   LEUKOCYTESUR NEGATIVE 01/28/2020 0024   Sepsis Labs: @LABRCNTIP (procalcitonin:4,lacticidven:4) ) Recent Results (from the past 240 hour(s))  SARS Coronavirus 2 by RT PCR (hospital order, performed in [place] hospital lab) Nasopharyngeal Nasopharyngeal Swab     Status: Abnormal   Collection Time: 01/28/20 12:24 AM   Specimen: Nasopharyngeal Swab  Result Value Ref Range Status   SARS Coronavirus 2 POSITIVE (A) NEGATIVE Final    Comment: RESULT CALLED TO, READ BACK BY AND VERIFIED WITH: RN HARRIS C. AT [place] 01/28/2020 (NOTE) SARS-CoV-2 target nucleic acids are DETECTED  SARS-CoV-2 RNA is generally detectable in upper respiratory specimens  during the acute phase of infection.  Positive results are indicative  of the presence of the identified virus, but do  not rule out bacterial infection or co-infection with other pathogens not detected by the test.  Clinical correlation with patient history and  other diagnostic information is necessary to determine patient infection status.  The expected result is negative.  Fact Sheet for Patients:   StrictlyIdeas.no   Fact Sheet for Healthcare Providers:   BankingDealers.co.za    This test is not  yet approved or cleared by the [place] and  has been authorized for detection and/or diagnosis of SARS-CoV-2 by FDA under an Emergency Use Authorization (EUA).  This EUA will remain in effect (me aning this test can be used) for the duration of  the COVID-19 declaration under Section 564(b)(1) of the Act, 21 U.S.C. section 360-bbb-3(b)(1), unless the authorization is terminated or revoked sooner.  Performed at [facility], [place] [address] [place], [place] [address]      Radiological Exams on Admission: CT ABDOMEN PELVIS WO CONTRAST  Result Date: 01/28/2020 CLINICAL DATA:  Sepsis, weakness fatigue vomiting EXAM: CT ABDOMEN AND PELVIS WITHOUT CONTRAST TECHNIQUE: Multidetector CT imaging of the abdomen and pelvis was performed following the standard protocol without IV contrast. COMPARISON:  None. FINDINGS: Lower chest: The visualized heart size within normal limits. No pericardial fluid/thickening. There is a small hiatal hernia present. Ground-glass opacities are seen at both lung bases, predominantly within the periphery. Hepatobiliary: The liver is normal in density without focal abnormality.The main portal vein is patent. The patient is status post cholecystectomy. No biliary ductal dilation. Pancreas: Unremarkable. No pancreatic ductal dilatation or surrounding inflammatory changes. Spleen: Normal in size without focal abnormality. Adrenals/Urinary Tract: Both adrenal glands appear normal. The kidneys and collecting system appear normal without  evidence of urinary tract calculus or hydronephrosis. Bladder is unremarkable. Stomach/Bowel: The stomach, small bowel, and colon are normal in appearance. No inflammatory changes, wall thickening, or obstructive findings. The patient has had a prior right hemicolectomy and appendectomy. Vascular/Lymphatic: There are no enlarged mesenteric, retroperitoneal, or pelvic lymph nodes. Scattered aortic atherosclerotic calcifications are seen without aneurysmal dilatation. Reproductive: The patient is status post hysterectomy. No adnexal masses or collections seen. Other: There is a small abdominal wall hernia containing the tiny loop of bowel. No evidence of bowel strangulation is seen. Within the right lower quadrant there is a loculated area of fat with fat stranding changes measuring approximately 3.2 cm which could be a prior area of omental infarct/fat necrosis. Musculoskeletal: No acute or significant osseous findings. Degenerative changes seen in the thoracolumbar spine. IMPRESSION: Ground-glass opacities seen at both lung bases which may be due to inflammatory or early infectious etiology. No acute intra-abdominal or pelvic pathology to explain the patient's symptoms. Aortic Atherosclerosis (ICD10-I70.0). Electronically Signed   By: Prudencio Pair M.D.   On: 01/28/2020 02:20   DG Chest Port 1 View  Result Date: 01/27/2020 CLINICAL DATA:  Sepsis.  Nausea and fatigue. EXAM: PORTABLE CHEST 1 VIEW COMPARISON:  04/11/2018 FINDINGS: Lung volumes are low. Normal heart size with mild aortic tortuosity and atherosclerosis. Minimal patchy opacity at the left greater than right lung base. No pulmonary edema. No pleural fluid or pneumothorax. Degenerative change in the spine. No acute osseous abnormalities are seen. IMPRESSION: Low lung volumes with patchy left greater than right basilar opacity, favor atelectasis. Pneumonia could have a similar appearance in the appropriate clinical setting. Electronically Signed   By:  Keith Rake M.D.   On: 01/27/2020 23:24    EKG: Independently reviewed.  Normal sinus rhythm.  IVCD.  Assessment/Plan Principal Problem:   Pneumonia due to COVID-19 virus Active Problems:   Coronary artery disease   Tobacco abuse   Hypothyroidism   HLD (hyperlipidemia)   Gout   Morbid obesity (H[facility])   CKD (chronic kidney disease) stage 3, GFR 30-59 ml/min   Dyslipidemia   CAP (community acquired pneumonia)    1. Pneumonia due to COVID-19 infection -patient is presently  not hypoxic.  I have started patient on remdesivir.  If inflammatory markers are getting elevated or if patient becomes hypoxic will start steroids.  I have also discussed with patient about the off label use of Actemra.  Patient agrees for Actemra if necessary. 2. Low back pain has followed with Dr. Louanne Skye orthopedic surgeon yesterday was placed on hydrocodone eventually will need MRI.  Presently no definite signs of any focal deficits. 3. CAD status post stenting -mildly elevated high sensitive troponin otherwise denies any chest pain.  On Plavix Imdur beta-blocker statins which will be continued. 4. Chronic kidney disease stage III creatinine appears to be at baseline. 5. Anemia could be from renal disease follow CBC. 6. Hypothyroidism on Synthroid. 7. Tobacco abuse advised about quitting smoking.  Since patient has COVID-19 infection and has multiple comorbid conditions patient has high risk for deterioration and will need to be monitored closely.  Patient has been admitted as inpatient.   DVT prophylaxis: Lovenox. Code Status: Full code. Family Communication: Discussed with patient. Disposition Plan: Home. Consults called: None. Admission status: Inpatient.   Rise Patience MD [facility] Pager [phone].  If 7PM-7AM, please contact night-coverage [email or web address] Password [facility]  01/28/2020, 4:54 AM

## 2020-01-28 NOTE — ED Notes (Signed)
Date and time results received: 01/28/20 0337 (use smartphrase ".now" to insert current time)  Test: SARS Coronavirus Critical Value: Positive   Name of Provider Notified: Opyd MD  Orders Received? Or Actions Taken?:

## 2020-01-28 NOTE — ED Notes (Signed)
Lunch Tray Ordered @ 1027. °

## 2020-01-28 NOTE — ED Notes (Signed)
Daughter in law [place] called and would like an update on pt. Please call [phone]

## 2020-01-28 NOTE — ED Notes (Signed)
Patient transported to CT 

## 2020-01-29 LAB — COMPREHENSIVE METABOLIC PANEL
ALT: 20 U/L (ref 0–44)
AST: 29 U/L (ref 15–41)
Albumin: 2.8 g/dL — ABNORMAL LOW (ref 3.5–5.0)
Alkaline Phosphatase: 79 U/L (ref 38–126)
Anion gap: 12 (ref 5–15)
BUN: 16 mg/dL (ref 8–23)
CO2: 20 mmol/L — ABNORMAL LOW (ref 22–32)
Calcium: 8.5 mg/dL — ABNORMAL LOW (ref 8.9–10.3)
Chloride: 108 mmol/L (ref 98–111)
Creatinine, Ser: 1.27 mg/dL — ABNORMAL HIGH (ref 0.44–1.00)
GFR calc Af Amer: 46 mL/min — ABNORMAL LOW (ref >60)
GFR calc non Af Amer: 40 mL/min — ABNORMAL LOW (ref >60)
Glucose, Bld: 114 mg/dL — ABNORMAL HIGH (ref 70–99)
Potassium: 4.2 mmol/L (ref 3.5–5.1)
Sodium: 140 mmol/L (ref 135–145)
Total Bilirubin: 1 mg/dL (ref 0.3–1.2)
Total Protein: 6.2 g/dL — ABNORMAL LOW (ref 6.5–8.1)

## 2020-01-29 LAB — CBC WITH DIFFERENTIAL/PLATELET
Abs Immature Granulocytes: 0 10*3/uL (ref 0.00–0.07)
Basophils Absolute: 0 10*3/uL (ref 0.0–0.1)
Basophils Relative: 0 %
Eosinophils Absolute: 0 10*3/uL (ref 0.0–0.5)
Eosinophils Relative: 0 %
HCT: 35.1 % — ABNORMAL LOW (ref 36.0–46.0)
Hemoglobin: 11.5 g/dL — ABNORMAL LOW (ref 12.0–15.0)
Lymphocytes Relative: 8 %
Lymphs Abs: 0.2 10*3/uL — ABNORMAL LOW (ref 0.7–4.0)
MCH: 25.4 pg — ABNORMAL LOW (ref 26.0–34.0)
MCHC: 32.8 g/dL (ref 30.0–36.0)
MCV: 77.7 fL — ABNORMAL LOW (ref 80.0–100.0)
Monocytes Absolute: 0 10*3/uL — ABNORMAL LOW (ref 0.1–1.0)
Monocytes Relative: 2 %
Neutro Abs: 1.9 10*3/uL (ref 1.7–7.7)
Neutrophils Relative %: 90 %
Platelets: 251 10*3/uL (ref 150–400)
RBC: 4.52 MIL/uL (ref 3.87–5.11)
RDW: 18.3 % — ABNORMAL HIGH (ref 11.5–15.5)
WBC: 2.1 10*3/uL — ABNORMAL LOW (ref 4.0–10.5)
nRBC: 0 % (ref 0.0–0.2)
nRBC: 0 /100 WBC

## 2020-01-29 LAB — URINE CULTURE

## 2020-01-29 LAB — D-DIMER, QUANTITATIVE: D-Dimer, Quant: 1.81 ug/mL-FEU — ABNORMAL HIGH (ref 0.00–0.50)

## 2020-01-29 LAB — FERRITIN: Ferritin: 101 ng/mL (ref 11–307)

## 2020-01-29 NOTE — ED Notes (Signed)
Cleaned up pt and pt room. Set pt up in chair to eat her bfast

## 2020-01-29 NOTE — Progress Notes (Signed)
PROGRESS NOTE                                                                                                                                                                                                             Patient Demographics:    Joan Elliott, is a 79 y.o. female, DOB - 1941/04/26, HUT:[id number]  Outpatient Primary MD for the patient is [name], MD   Admit date - 01/27/2020   LOS - 1  Chief Complaint  Patient presents with   Fatigue   Nausea   Emesis       Brief Narrative: Patient is a 79 y.o. female with PMHx of CAD s/p PCI 2003, CKD stage IIIa, HTN, tobacco abuse, HLD, hypothyroidism who presented with weakness, fatigue and shortness of breath along with fever-found to have COVID-19 pneumonia and admitted to the hospitalist service.  See below for further details.  Significant Events: 7/26>> admit to [facility]-for weakness/fever-found to have COVID-19 pneumonia  Significant studies: 7/26>> chest x-ray: Patchy left greater than right basilar opacity 7/27>> CT abdomen/pelvis: Groundglass opacities seen at both lung bases.  No acute intra abdominal or pelvic pathology.  COVID-19 medications: Remdesivir: 7/26>>  Antibiotics: None  Microbiology data: 7/26>> blood culture: Pending 7/27>> blood culture: Pending 7/27>> urine culture: Multiple species  Procedures: None  Consults: None  DVT prophylaxis: enoxaparin (LOVENOX) injection 40 mg Start: 01/28/20 1400    Subjective:   Feels better-not on oxygen-but still continues to cough.  Less weakness compared to the past few days.   Assessment  & Plan :   Covid 19 Viral pneumonia: Symptoms have improved overnight-continue steroids/remdesivir-not on oxygen.  Mobilize and see how she does.  She has significant medical comorbidities-advanced age-prudent to continue with IV Remdesivir/steroids-as she is risk for severe disease.    Fever:  afebrile O2 requirements:  SpO2: 95 %  COVID-19 Labs: Recent Labs    01/28/20 0453 01/28/20 0841 01/29/20 0333  DDIMER 2.34* 2.54* 1.81*  FERRITIN  --   --  101  CRP 9.5* 10.8*  --        Component Value Date/Time   BNP 48.1 02/23/2015 1941    Recent Labs  Lab 01/28/20 0841  PROCALCITON <0.10    Lab Results  Component Value Date   SARSCOV2NAA POSITIVE (A) 01/28/2020     Prone/Incentive Spirometry: encouragedincentive spirometry  use 3-4/hour.  CKD stage IIIa: Creatinine close to baseline-follow periodically.  CAD s/p PCI 2003: No anginal symptoms-continue Plavix, Imdur, beta-blocker and statin.  Anemia: Stable-likely from CKD-stable for follow-up.  Hypothyroidism: Continue Synthroid  Tobacco abuse: Counseled  Chronic low back pain: Followed by orthopedics-nonfocal exam-we will need MRI at some point-suspect will be done in the outpatient setting.   Obesity: Estimated body mass index is 37.5 kg/m as calculated from the following:   Height as of an earlier encounter on 01/27/20: 5' (1.524 m).   Weight as of an earlier encounter on 01/27/20: 87.1 kg.   ABG:    Component Value Date/Time   TCO2 25 03/11/2017 1545    Vent Settings: N/A  Condition - Stable  Family Communication  Marland Kitchen  Joelene Millin- [phone] 7/28   Code Status :  Full Code  Diet :  Diet Order            Diet Heart Room service appropriate? Yes; Fluid consistency: Thin  Diet effective now                  Disposition Plan  :   Status is: Inpatient  Remains inpatient appropriate because:Inpatient level of care appropriate due to severity of illness   Dispo: The patient is from: Home              Anticipated d/c is to: Home              Anticipated d/c date is: 3 days              Patient currently is not medically stable to d/c.   Barriers to discharge: Hypoxia requiring O2 supplementation/complete 5 days of IV Remdesivir  Antimicorbials  :    Anti-infectives (From  admission, onward)   Start     Dose/Rate Route Frequency Ordered Stop   01/29/20 1000  remdesivir 100 mg in sodium chloride 0.9 % 100 mL IVPB     Discontinue    "Followed by" Linked Group Details   100 mg 200 mL/hr over 30 Minutes Intravenous Daily 01/28/20 0456 02/02/20 0959   01/28/20 0600  remdesivir 200 mg in sodium chloride 0.9% 250 mL IVPB       "Followed by" Linked Group Details   200 mg 580 mL/hr over 30 Minutes Intravenous Once 01/28/20 0456 01/28/20 0720   01/27/20 2330  azithromycin (ZITHROMAX) 500 mg in sodium chloride 0.9 % 250 mL IVPB        500 mg 250 mL/hr over 60 Minutes Intravenous  Once 01/27/20 2327 01/28/20 0241   01/27/20 2315  cefTRIAXone (ROCEPHIN) 1 g in sodium chloride 0.9 % 100 mL IVPB  Status:  Discontinued        1 g 200 mL/hr over 30 Minutes Intravenous Every 24 hours 01/27/20 2308 01/28/20 0453      Inpatient Medications  Scheduled Meds:  allopurinol  100 mg Oral Daily   vitamin C  500 mg Oral Daily   clopidogrel  75 mg Oral Daily   dexamethasone  6 mg Oral Daily   docusate sodium  100 mg Oral BID   enoxaparin (LOVENOX) injection  40 mg Subcutaneous Q24H   gabapentin  100 mg Oral QHS   isosorbide mononitrate  30 mg Oral Daily   levothyroxine  50 mcg Oral Q0600   metoprolol tartrate  12.5 mg Oral BID   oxybutynin  5 mg Oral BID   pantoprazole  40 mg Oral BID   rosuvastatin  40  mg Oral Daily   zinc sulfate  220 mg Oral Daily   Continuous Infusions:  remdesivir 100 mg in NS 100 mL 100 mg (01/29/20 1013)   PRN Meds:.guaiFENesin-dextromethorphan, HYDROcodone-acetaminophen, nitroGLYCERIN, ondansetron **OR** ondansetron (ZOFRAN) IV   Time Spent in minutes  25   See all Orders from today for further details   Oren Binet M.D on 01/29/2020 at 2:23 PM  To page go to [email or web address] - use universal password  [facility] -  Office  [phone]    Objective:   Vitals:   01/29/20 0522 01/29/20 0600 01/29/20 0700  01/29/20 0900  BP: (!) 141/67 (!) 134/65 128/69 (!) 134/77  Pulse: 51 58 55 100  Resp: 18 20 12 22   Temp:      TempSrc:      SpO2: 98% 99% 97% 95%    Wt Readings from Last 3 Encounters:  01/27/20 87.1 kg  04/08/19 87.1 kg  04/16/18 93.4 kg    No intake or output data in the 24 hours ending 01/29/20 1423   Physical Exam Gen Exam:Alert awake-not in any distress HEENT:atraumatic, normocephalic Chest: B/L clear to auscultation anteriorly CVS:S1S2 regular Abdomen:soft non tender, non distended Extremities:no edema Neurology: Non focal Skin: no rash   Data Review:    CBC Recent Labs  Lab 01/27/20 1628 01/27/20 2301 01/28/20 0841 01/29/20 0333  WBC 4.2 3.8* 2.8* 2.1*  HGB 12.1 11.8* 11.5* 11.5*  HCT 38.8 36.4 36.0 35.1*  PLT 249 207 249 251  MCV 79.7* 77.9* 78.9* 77.7*  MCH 24.8* 25.3* 25.2* 25.4*  MCHC 31.2* 32.4 31.9 32.8  RDW 18.3* 18.4* 18.5* 18.3*  LYMPHSABS  --  0.7  --  0.2*  MONOABS  --  0.3  --  0.0*  EOSABS  --  0.0  --  0.0  BASOSABS  --  0.0  --  0.0    Chemistries  Recent Labs  Lab 01/27/20 1628 01/27/20 2301 01/28/20 0453 01/28/20 0841 01/29/20 0333  NA 139 138  --  141 140  K 3.9 3.5  --  3.9 4.2  CL 104 105  --  106 108  CO2 23 20*  --  24 20*  GLUCOSE 88 88  --  87 114*  BUN 16 12  --  11 16  CREATININE 1.43* 1.33* 1.22* 1.36* 1.27*  CALCIUM 8.8 8.5*  --  8.5* 8.5*  AST  --  33  --  30 29  ALT  --  22  --  20 20  ALKPHOS  --  92  --  78 79  BILITOT  --  0.8  --  0.7 1.0   ------------------------------------------------------------------------------------------------------------------ No results for input(s): CHOL, HDL, LDLCALC, TRIG, CHOLHDL, LDLDIRECT in the last 72 hours.  Lab Results  Component Value Date   HGBA1C 6.3 (H) 02/23/2015   ------------------------------------------------------------------------------------------------------------------ No results for input(s): TSH, T4TOTAL, T3FREE, THYROIDAB in the last 72  hours.  Invalid input(s): FREET3 ------------------------------------------------------------------------------------------------------------------ Recent Labs    01/29/20 0333  FERRITIN 101    Coagulation profile Recent Labs  Lab 01/27/20 2301  INR 1.0    Recent Labs    01/28/20 0841 01/29/20 0333  DDIMER 2.54* 1.81*    Cardiac Enzymes No results for input(s): CKMB, TROPONINI, MYOGLOBIN in the last 168 hours.  Invalid input(s): CK ------------------------------------------------------------------------------------------------------------------    Component Value Date/Time   BNP 48.1 02/23/2015 1941    Micro Results Recent Results (from the past 240 hour(s))  Urine Culture     Status: Abnormal  Collection Time: 01/28/20 12:24 AM   Specimen: Urine, Random  Result Value Ref Range Status   Specimen Description URINE, RANDOM  Final   Special Requests   Final    NONE Performed at [facility], [place] [address] [place], [place] [address]    Culture MULTIPLE SPECIES PRESENT, SUGGEST RECOLLECTION (A)  Final   Report Status 01/29/2020 FINAL  Final  SARS Coronavirus 2 by RT PCR (hospital order, performed in [facility] hospital lab) Nasopharyngeal Nasopharyngeal Swab     Status: Abnormal   Collection Time: 01/28/20 12:24 AM   Specimen: Nasopharyngeal Swab  Result Value Ref Range Status   SARS Coronavirus 2 POSITIVE (A) NEGATIVE Final    Comment: RESULT CALLED TO, READ BACK BY AND VERIFIED WITH: RN HARRIS C. AT Experiment 01/28/2020 (NOTE) SARS-CoV-2 target nucleic acids are DETECTED  SARS-CoV-2 RNA is generally detectable in upper respiratory specimens  during the acute phase of infection.  Positive results are indicative  of the presence of the identified virus, but do not rule out bacterial infection or co-infection with other pathogens not detected by the test.  Clinical correlation with patient history and  other diagnostic information is  necessary to determine patient infection status.  The expected result is negative.  Fact Sheet for Patients:   StrictlyIdeas.no   Fact Sheet for Healthcare Providers:   BankingDealers.co.za    This test is not yet approved or cleared by the [place] FDA and  has been authorized for detection and/or diagnosis of SARS-CoV-2 by FDA under an Emergency Use Authorization (EUA).  This EUA will remain in effect (me aning this test can be used) for the duration of  the COVID-19 declaration under Section 564(b)(1) of the Act, 21 U.S.C. section 360-bbb-3(b)(1), unless the authorization is terminated or revoked sooner.  Performed at [facility], [place] [address], [place], [place] [address]     Radiology Reports CT ABDOMEN PELVIS WO CONTRAST  Result Date: 01/28/2020 CLINICAL DATA:  Sepsis, weakness fatigue vomiting EXAM: CT ABDOMEN AND PELVIS WITHOUT CONTRAST TECHNIQUE: Multidetector CT imaging of the abdomen and pelvis was performed following the standard protocol without IV contrast. COMPARISON:  None. FINDINGS: Lower chest: The visualized heart size within normal limits. No pericardial fluid/thickening. There is a small hiatal hernia present. Ground-glass opacities are seen at both lung bases, predominantly within the periphery. Hepatobiliary: The liver is normal in density without focal abnormality.The main portal vein is patent. The patient is status post cholecystectomy. No biliary ductal dilation. Pancreas: Unremarkable. No pancreatic ductal dilatation or surrounding inflammatory changes. Spleen: Normal in size without focal abnormality. Adrenals/Urinary Tract: Both adrenal glands appear normal. The kidneys and collecting system appear normal without evidence of urinary tract calculus or hydronephrosis. Bladder is unremarkable. Stomach/Bowel: The stomach, small bowel, and colon are normal in appearance. No inflammatory changes, wall  thickening, or obstructive findings. The patient has had a prior right hemicolectomy and appendectomy. Vascular/Lymphatic: There are no enlarged mesenteric, retroperitoneal, or pelvic lymph nodes. Scattered aortic atherosclerotic calcifications are seen without aneurysmal dilatation. Reproductive: The patient is status post hysterectomy. No adnexal masses or collections seen. Other: There is a small abdominal wall hernia containing the tiny loop of bowel. No evidence of bowel strangulation is seen. Within the right lower quadrant there is a loculated area of fat with fat stranding changes measuring approximately 3.2 cm which could be a prior area of omental infarct/fat necrosis. Musculoskeletal: No acute or significant osseous findings. Degenerative changes  seen in the thoracolumbar spine. IMPRESSION: Ground-glass opacities seen at both lung bases which may be due to inflammatory or early infectious etiology. No acute intra-abdominal or pelvic pathology to explain the patient's symptoms. Aortic Atherosclerosis (ICD10-I70.0). Electronically Signed   By: Prudencio Pair M.D.   On: 01/28/2020 02:20   DG Chest Port 1 View  Result Date: 01/27/2020 CLINICAL DATA:  Sepsis.  Nausea and fatigue. EXAM: PORTABLE CHEST 1 VIEW COMPARISON:  04/11/2018 FINDINGS: Lung volumes are low. Normal heart size with mild aortic tortuosity and atherosclerosis. Minimal patchy opacity at the left greater than right lung base. No pulmonary edema. No pleural fluid or pneumothorax. Degenerative change in the spine. No acute osseous abnormalities are seen. IMPRESSION: Low lung volumes with patchy left greater than right basilar opacity, favor atelectasis. Pneumonia could have a similar appearance in the appropriate clinical setting. Electronically Signed   By: Keith Rake M.D.   On: 01/27/2020 23:24

## 2020-01-30 LAB — CBC WITH DIFFERENTIAL/PLATELET
Abs Immature Granulocytes: 0.01 10*3/uL (ref 0.00–0.07)
Basophils Absolute: 0 10*3/uL (ref 0.0–0.1)
Basophils Relative: 0 %
Eosinophils Absolute: 0 10*3/uL (ref 0.0–0.5)
Eosinophils Relative: 0 %
HCT: 32.9 % — ABNORMAL LOW (ref 36.0–46.0)
Hemoglobin: 10.6 g/dL — ABNORMAL LOW (ref 12.0–15.0)
Immature Granulocytes: 0 %
Lymphocytes Relative: 22 %
Lymphs Abs: 0.7 10*3/uL (ref 0.7–4.0)
MCH: 25.2 pg — ABNORMAL LOW (ref 26.0–34.0)
MCHC: 32.2 g/dL (ref 30.0–36.0)
MCV: 78.3 fL — ABNORMAL LOW (ref 80.0–100.0)
Monocytes Absolute: 0.3 10*3/uL (ref 0.1–1.0)
Monocytes Relative: 9 %
Neutro Abs: 2.3 10*3/uL (ref 1.7–7.7)
Neutrophils Relative %: 69 %
Platelets: 283 10*3/uL (ref 150–400)
RBC: 4.2 MIL/uL (ref 3.87–5.11)
RDW: 18.4 % — ABNORMAL HIGH (ref 11.5–15.5)
WBC: 3.3 10*3/uL — ABNORMAL LOW (ref 4.0–10.5)
nRBC: 0 % (ref 0.0–0.2)

## 2020-01-30 LAB — COMPREHENSIVE METABOLIC PANEL
ALT: 17 U/L (ref 0–44)
AST: 23 U/L (ref 15–41)
Albumin: 2.7 g/dL — ABNORMAL LOW (ref 3.5–5.0)
Alkaline Phosphatase: 70 U/L (ref 38–126)
Anion gap: 10 (ref 5–15)
BUN: 23 mg/dL (ref 8–23)
CO2: 21 mmol/L — ABNORMAL LOW (ref 22–32)
Calcium: 8.6 mg/dL — ABNORMAL LOW (ref 8.9–10.3)
Chloride: 108 mmol/L (ref 98–111)
Creatinine, Ser: 1.67 mg/dL — ABNORMAL HIGH (ref 0.44–1.00)
GFR calc Af Amer: 33 mL/min — ABNORMAL LOW (ref >60)
GFR calc non Af Amer: 29 mL/min — ABNORMAL LOW (ref >60)
Glucose, Bld: 123 mg/dL — ABNORMAL HIGH (ref 70–99)
Potassium: 4.3 mmol/L (ref 3.5–5.1)
Sodium: 139 mmol/L (ref 135–145)
Total Bilirubin: 0.5 mg/dL (ref 0.3–1.2)
Total Protein: 5.8 g/dL — ABNORMAL LOW (ref 6.5–8.1)

## 2020-01-30 LAB — FERRITIN: Ferritin: 105 ng/mL (ref 11–307)

## 2020-01-30 LAB — D-DIMER, QUANTITATIVE: D-Dimer, Quant: 1.05 ug/mL-FEU — ABNORMAL HIGH (ref 0.00–0.50)

## 2020-01-30 LAB — C-REACTIVE PROTEIN: CRP: 3.8 mg/dL — ABNORMAL HIGH (ref <1.0)

## 2020-01-30 MED ORDER — SODIUM CHLORIDE 0.9 % IV SOLN: INTRAVENOUS | Status: AC

## 2020-01-30 MED ORDER — CYCLOBENZAPRINE HCL 5 MG PO TABS
5.0000 mg | ORAL_TABLET | Freq: Three times a day (TID) | ORAL | Status: DC | PRN
  Administered 2020-01-31: 5 mg via ORAL
  Filled 2020-01-30: qty 1

## 2020-01-30 NOTE — Evaluation (Signed)
Physical Therapy Evaluation Patient Details Name: Joan Elliott MRN: [id number] DOB: 1940-11-02 Today's Date: 01/30/2020   History of Present Illness  Patient is a 79 y.o. female with PMHx of CAD s/p PCI 2003, CKD stage IIIa, HTN, tobacco abuse, HLD, hypothyroidism who presented with weakness, fatigue and shortness of breath along with fever-found to have COVID-19 pneumonia and admitted to the hospitalist service.  Clinical Impression  Pt admitted with/for covid PNA.  Pt mobilizing at [place], Sats remain at 94% on RA.  Pt currently limited functionally due to the problems listed below.  (see problems list.)  Pt will benefit from PT to maximize function and safety to be able to get home safely with available assist .     Follow Up Recommendations Home health PT (working toward no follow up needs)    Equipment Recommendations  None recommended by PT    Recommendations for Other Services       Precautions / Restrictions Precautions Precautions: Fall      Mobility  Bed Mobility Overal bed mobility: Modified Independent                Transfers Overall transfer level: Modified independent               General transfer comment: cues for better safety  Ambulation/Gait Ambulation/Gait assistance: Supervision Gait Distance (Feet): 280 Feet Assistive device: Rolling walker (2 wheeled) Gait Pattern/deviations: Step-through pattern   Gait velocity interpretation: 1.31 - 2.62 ft/sec, indicative of limited community ambulator General Gait Details: generally steady, low use of the RW,  sats on RA 94%  HR 80's  Stairs            Wheelchair Mobility    Modified Rankin (Stroke Patients Only)       Balance Overall balance assessment: No apparent balance deficits (not formally assessed)                                           Pertinent Vitals/Pain Pain Assessment: No/denies pain    Home Living Family/patient expects to be  discharged to:: Private residence Living Arrangements: Alone Available Help at Discharge: Other (Comment) (limited assist) Type of Home: Apartment Home Access: Level entry     Home Layout: One level Home Equipment: None;Cane - single point      Prior Function Level of Independence: Independent;Independent with assistive device(s) (no ADin the home)               Hand Dominance        Extremity/Trunk Assessment   Upper Extremity Assessment Upper Extremity Assessment: Defer to OT evaluation    Lower Extremity Assessment Lower Extremity Assessment: Generalized weakness;Overall [facility] for tasks assessed (right generally worse than left)       Communication   Communication: No difficulties  Cognition Arousal/Alertness: Awake/alert Behavior During Therapy: WFL for tasks assessed/performed Overall Cognitive Status: Within Functional Limits for tasks assessed                                        General Comments General comments (skin integrity, edema, etc.): has no assist resources at home, but want to go home .    Exercises     Assessment/Plan    PT Assessment Patient needs continued PT services  PT  Problem List Decreased strength;Decreased activity tolerance;Decreased mobility;Cardiopulmonary status limiting activity       PT Treatment Interventions Gait training;Functional mobility training;Therapeutic activities;Patient/family education    PT Goals (Current goals can be found in the Care Plan section)  Acute Rehab PT Goals Patient Stated Goal: home independent PT Goal Formulation: With patient Time For Goal Achievement: 02/13/20 Potential to Achieve Goals: Good    Frequency Min 3X/week   Barriers to discharge        Co-evaluation               AM-PAC PT "6 Clicks" Mobility  Outcome Measure Help needed turning from your back to your side while in a flat bed without using bedrails?: None Help needed moving from lying on your  back to sitting on the side of a flat bed without using bedrails?: None Help needed moving to and from a bed to a chair (including a wheelchair)?: None Help needed standing up from a chair using your arms (e.g., wheelchair or bedside chair)?: None Help needed to walk in hospital room?: None Help needed climbing 3-5 steps with a railing? : A Little 6 Click Score: 23    End of Session   Activity Tolerance: Patient tolerated treatment well Patient left: in chair;with call bell/phone within reach Nurse Communication: Mobility status PT Visit Diagnosis: Other abnormalities of gait and mobility (R26.89);Difficulty in walking, not elsewhere classified (R26.2)    Time: 5374-8270 PT Time Calculation (min) (ACUTE ONLY): 36 min   Charges:   PT Evaluation $PT Eval Low Complexity: 1 Low PT Treatments $Gait Training: 8-22 mins        01/30/2020  Joan Elliott., PT Acute Rehabilitation Services [phone]  (pager) [phone]  (office)  Joan Elliott 01/30/2020, 12:41 PM

## 2020-01-30 NOTE — Progress Notes (Signed)
PROGRESS NOTE                                                                                                                                                                                                             Patient Demographics:    Joan Elliott, is a 79 y.o. female, DOB - Apr 18, 1941, NOI:[id number]  Outpatient Primary MD for the patient is [name], MD   Admit date - 01/27/2020   LOS - 2  Chief Complaint  Patient presents with   Fatigue   Nausea   Emesis       Brief Narrative: Patient is a 79 y.o. female with PMHx of CAD s/p PCI 2003, CKD stage IIIa, HTN, tobacco abuse, HLD, hypothyroidism who presented with weakness, fatigue and shortness of breath along with fever-found to have COVID-19 pneumonia and admitted to the hospitalist service.  See below for further details.  Significant Events: 7/26>> admit to [facility]-for weakness/fever-found to have COVID-19 pneumonia  Significant studies: 7/26>> chest x-ray: Patchy left greater than right basilar opacity 7/27>> CT abdomen/pelvis: Groundglass opacities seen at both lung bases.  No acute intra abdominal or pelvic pathology.  COVID-19 medications: Remdesivir: 7/26>> Decadron: 7/27>>7/29  Antibiotics: None  Microbiology data: 7/26>> blood culture: no growth 7/27>> blood culture: no growth 7/27>> urine culture: Multiple species  Procedures: None  Consults: None  DVT prophylaxis: enoxaparin (LOVENOX) injection 40 mg Start: 01/28/20 1400    Subjective:   Bilateral upper abdominal/lower chest discomfort this morning-no nausea vomiting.  Breathing appears same.  Somewhat anxious.   Assessment  & Plan :   Covid 19 Viral pneumonia: Symptoms have improved overnight-continue remdesivir-no longer on steroids-inflammatory markers downtrending-not hypoxic.  Continue to mobilize-suspected as long as she improves-she should be stable for discharge over  the next few days.  Given risk of severe disease given advanced age and multiple comorbidities-reasonable to continue with IV Remdesivir x5 days.  Fever: afebrile O2 requirements:  SpO2: 98 %  COVID-19 Labs: Recent Labs    01/28/20 0453 01/28/20 0453 01/28/20 0841 01/29/20 0333 01/30/20 0428  DDIMER 2.34*   < > 2.54* 1.81* 1.05*  FERRITIN  --   --   --  101 105  CRP 9.5*  --  10.8*  --  3.8*   < > = values in this interval not displayed.  Component Value Date/Time   BNP 48.1 02/23/2015 1941    Recent Labs  Lab 01/28/20 0841  PROCALCITON <0.10    Lab Results  Component Value Date   SARSCOV2NAA POSITIVE (A) 01/28/2020     Prone/Incentive Spirometry: encouragedincentive spirometry use 3-4/hour.  AKI CKD stage IIIa: Slight bump in creatinine-volume-stable-we will hydrate for a few hours-and recheck electrolytes in a.m.  CAD s/p PCI 2003: No anginal symptoms-continue Plavix, Imdur, beta-blocker and statin.  Anemia: Stable-likely from CKD-stable for follow-up.  Hypothyroidism: Continue Synthroid  Tobacco abuse: Counseled  Chronic low back pain: Followed by orthopedics-nonfocal exam-we will need MRI at some point-suspect will be done in the outpatient setting.   Obesity: Estimated body mass index is 37.5 kg/m as calculated from the following:   Height as of an earlier encounter on 01/27/20: 5' (1.524 m).   Weight as of an earlier encounter on 01/27/20: 87.1 kg.   ABG:    Component Value Date/Time   TCO2 25 03/11/2017 1545    Vent Settings: N/A  Condition - Stable  Family Communication  Marland Kitchen  Joelene Millin- [id number] 7/28   Code Status :  Full Code  Diet :  Diet Order            Diet Heart Room service appropriate? Yes; Fluid consistency: Thin  Diet effective now                  Disposition Plan  :   Status is: Inpatient  Remains inpatient appropriate because:Inpatient level of care appropriate due to severity of illness   Dispo: The  patient is from: Home              Anticipated d/c is to: Home              Anticipated d/c date is: 3 days              Patient currently is not medically stable to d/c.   Barriers to discharge: Hypoxia requiring O2 supplementation/complete 5 days of IV Remdesivir  Antimicorbials  :    Anti-infectives (From admission, onward)   Start     Dose/Rate Route Frequency Ordered Stop   01/29/20 1000  remdesivir 100 mg in sodium chloride 0.9 % 100 mL IVPB     Discontinue    "Followed by" Linked Group Details   100 mg 200 mL/hr over 30 Minutes Intravenous Daily 01/28/20 0456 02/02/20 0959   01/28/20 0600  remdesivir 200 mg in sodium chloride 0.9% 250 mL IVPB       "Followed by" Linked Group Details   200 mg 580 mL/hr over 30 Minutes Intravenous Once 01/28/20 0456 01/28/20 0720   01/27/20 2330  azithromycin (ZITHROMAX) 500 mg in sodium chloride 0.9 % 250 mL IVPB        500 mg 250 mL/hr over 60 Minutes Intravenous  Once 01/27/20 2327 01/28/20 0241   01/27/20 2315  cefTRIAXone (ROCEPHIN) 1 g in sodium chloride 0.9 % 100 mL IVPB  Status:  Discontinued        1 g 200 mL/hr over 30 Minutes Intravenous Every 24 hours 01/27/20 2308 01/28/20 0453      Inpatient Medications  Scheduled Meds:  allopurinol  100 mg Oral Daily   vitamin C  500 mg Oral Daily   clopidogrel  75 mg Oral Daily   docusate sodium  100 mg Oral BID   enoxaparin (LOVENOX) injection  40 mg Subcutaneous Q24H   gabapentin  100 mg Oral QHS  isosorbide mononitrate  30 mg Oral Daily   levothyroxine  50 mcg Oral Q0600   metoprolol tartrate  12.5 mg Oral BID   oxybutynin  5 mg Oral BID   pantoprazole  40 mg Oral BID   rosuvastatin  40 mg Oral Daily   zinc sulfate  220 mg Oral Daily   Continuous Infusions:  remdesivir 100 mg in NS 100 mL 100 mg (01/30/20 0954)   PRN Meds:.cyclobenzaprine, guaiFENesin-dextromethorphan, HYDROcodone-acetaminophen, nitroGLYCERIN, ondansetron **OR** ondansetron (ZOFRAN) IV   Time  Spent in minutes  25   See all Orders from today for further details   Oren Binet M.D on 01/30/2020 at 2:57 PM  To page go to [email or web address] - use universal password  [facility] -  Office  [phone]    Objective:   Vitals:   01/29/20 0900 01/29/20 2009 01/30/20 0400 01/30/20 1417  BP: (!) 134/77 (!) 129/67 124/77 119/81  Pulse: 100 80 49 65  Resp: 22 18 20 20   Temp:  98.1 F (36.7 C) 98.1 F (36.7 C) 97.8 F (36.6 C)  TempSrc:  Oral Oral Oral  SpO2: 95% 96% 97% 98%    Wt Readings from Last 3 Encounters:  01/27/20 87.1 kg  04/08/19 87.1 kg  04/16/18 93.4 kg     Intake/Output Summary (Last 24 hours) at 01/30/2020 1457 Last data filed at 01/30/2020 0900 Gross per 24 hour  Intake 550 ml  Output 850 ml  Net -300 ml     Physical Exam Gen Exam:Alert awake-not in any distress HEENT:atraumatic, normocephalic Chest: B/L clear to auscultation anteriorly CVS:S1S2 regular Abdomen:soft non tender, non distended Extremities:no edema Neurology: Non focal Skin: no rash   Data Review:    CBC Recent Labs  Lab 01/27/20 1628 01/27/20 2301 01/28/20 0841 01/29/20 0333 01/30/20 0428  WBC 4.2 3.8* 2.8* 2.1* 3.3*  HGB 12.1 11.8* 11.5* 11.5* 10.6*  HCT 38.8 36.4 36.0 35.1* 32.9*  PLT 249 207 249 251 283  MCV 79.7* 77.9* 78.9* 77.7* 78.3*  MCH 24.8* 25.3* 25.2* 25.4* 25.2*  MCHC 31.2* 32.4 31.9 32.8 32.2  RDW 18.3* 18.4* 18.5* 18.3* 18.4*  LYMPHSABS  --  0.7  --  0.2* 0.7  MONOABS  --  0.3  --  0.0* 0.3  EOSABS  --  0.0  --  0.0 0.0  BASOSABS  --  0.0  --  0.0 0.0    Chemistries  Recent Labs  Lab 01/27/20 1628 01/27/20 2301 01/28/20 0453 01/28/20 0841 01/29/20 0333 01/30/20 0428  NA 139 138  --  141 140 139  K 3.9 3.5  --  3.9 4.2 4.3  CL 104 105  --  106 108 108  CO2 23 20*  --  24 20* 21*  GLUCOSE 88 88  --  87 114* 123*  BUN 16 12  --  11 16 23   CREATININE 1.43* 1.33* 1.22* 1.36* 1.27* 1.67*  CALCIUM 8.8 8.5*  --  8.5* 8.5* 8.6*  AST   --  33  --  30 29 23   ALT  --  22  --  20 20 17   ALKPHOS  --  92  --  78 79 70  BILITOT  --  0.8  --  0.7 1.0 0.5   ------------------------------------------------------------------------------------------------------------------ No results for input(s): CHOL, HDL, LDLCALC, TRIG, CHOLHDL, LDLDIRECT in the last 72 hours.  Lab Results  Component Value Date   HGBA1C 6.3 (H) 02/23/2015   ------------------------------------------------------------------------------------------------------------------ No results for input(s): TSH, T4TOTAL, T3FREE, THYROIDAB in the  last 72 hours.  Invalid input(s): FREET3 ------------------------------------------------------------------------------------------------------------------ Recent Labs    01/29/20 0333 01/30/20 0428  FERRITIN 101 105    Coagulation profile Recent Labs  Lab 01/27/20 2301  INR 1.0    Recent Labs    01/29/20 0333 01/30/20 0428  DDIMER 1.81* 1.05*    Cardiac Enzymes No results for input(s): CKMB, TROPONINI, MYOGLOBIN in the last 168 hours.  Invalid input(s): CK ------------------------------------------------------------------------------------------------------------------    Component Value Date/Time   BNP 48.1 02/23/2015 1941    Micro Results Recent Results (from the past 240 hour(s))  Blood Culture (routine x 2)     Status: None (Preliminary result)   Collection Time: 01/27/20 11:57 PM   Specimen: BLOOD RIGHT WRIST  Result Value Ref Range Status   Specimen Description BLOOD RIGHT WRIST  Final   Special Requests   Final    BOTTLES DRAWN AEROBIC AND ANAEROBIC Blood Culture adequate volume   Culture   Final    NO GROWTH 2 DAYS Performed at [facility], [address] [place], [place] [address]    Report Status PENDING  Incomplete  Urine Culture     Status: Abnormal   Collection Time: 01/28/20 12:24 AM   Specimen: Urine, Random  Result Value Ref Range Status   Specimen Description URINE,  RANDOM  Final   Special Requests   Final    NONE Performed at [facility], [place] [address] [place], [place] [address]    Culture MULTIPLE SPECIES PRESENT, SUGGEST RECOLLECTION (A)  Final   Report Status 01/29/2020 FINAL  Final  SARS Coronavirus 2 by RT PCR (hospital order, performed in [facility] hospital lab) Nasopharyngeal Nasopharyngeal Swab     Status: Abnormal   Collection Time: 01/28/20 12:24 AM   Specimen: Nasopharyngeal Swab  Result Value Ref Range Status   SARS Coronavirus 2 POSITIVE (A) NEGATIVE Final    Comment: RESULT CALLED TO, READ BACK BY AND VERIFIED WITH: RN HARRIS C. AT [place] 01/28/2020 (NOTE) SARS-CoV-2 target nucleic acids are DETECTED  SARS-CoV-2 RNA is generally detectable in upper respiratory specimens  during the acute phase of infection.  Positive results are indicative  of the presence of the identified virus, but do not rule out bacterial infection or co-infection with other pathogens not detected by the test.  Clinical correlation with patient history and  other diagnostic information is necessary to determine patient infection status.  The expected result is negative.  Fact Sheet for Patients:   StrictlyIdeas.no   Fact Sheet for Healthcare Providers:   BankingDealers.co.za    This test is not yet approved or cleared by the [place] FDA and  has been authorized for detection and/or diagnosis of SARS-CoV-2 by FDA under an Emergency Use Authorization (EUA).  This EUA will remain in effect (me aning this test can be used) for the duration of  the COVID-19 declaration under Section 564(b)(1) of the Act, 21 U.S.C. section 360-bbb-3(b)(1), unless the authorization is terminated or revoked sooner.  Performed at [facility], [place] [address], [place], [place] [address]   Blood Culture (routine x 2)     Status: None (Preliminary result)   Collection Time: 01/28/20  1:33 AM    Specimen: BLOOD  Result Value Ref Range Status   Specimen Description BLOOD SITE NOT SPECIFIED  Final   Special Requests   Final    BOTTLES DRAWN AEROBIC AND ANAEROBIC Blood Culture adequate volume   Culture   Final  NO GROWTH 2 DAYS Performed at [facility], [place] [address] [place], [place] [address]    Report Status PENDING  Incomplete    Radiology Reports CT ABDOMEN PELVIS WO CONTRAST  Result Date: 01/28/2020 CLINICAL DATA:  Sepsis, weakness fatigue vomiting EXAM: CT ABDOMEN AND PELVIS WITHOUT CONTRAST TECHNIQUE: Multidetector CT imaging of the abdomen and pelvis was performed following the standard protocol without IV contrast. COMPARISON:  None. FINDINGS: Lower chest: The visualized heart size within normal limits. No pericardial fluid/thickening. There is a small hiatal hernia present. Ground-glass opacities are seen at both lung bases, predominantly within the periphery. Hepatobiliary: The liver is normal in density without focal abnormality.The main portal vein is patent. The patient is status post cholecystectomy. No biliary ductal dilation. Pancreas: Unremarkable. No pancreatic ductal dilatation or surrounding inflammatory changes. Spleen: Normal in size without focal abnormality. Adrenals/Urinary Tract: Both adrenal glands appear normal. The kidneys and collecting system appear normal without evidence of urinary tract calculus or hydronephrosis. Bladder is unremarkable. Stomach/Bowel: The stomach, small bowel, and colon are normal in appearance. No inflammatory changes, wall thickening, or obstructive findings. The patient has had a prior right hemicolectomy and appendectomy. Vascular/Lymphatic: There are no enlarged mesenteric, retroperitoneal, or pelvic lymph nodes. Scattered aortic atherosclerotic calcifications are seen without aneurysmal dilatation. Reproductive: The patient is status post hysterectomy. No adnexal masses or collections seen. Other: There is a small abdominal  wall hernia containing the tiny loop of bowel. No evidence of bowel strangulation is seen. Within the right lower quadrant there is a loculated area of fat with fat stranding changes measuring approximately 3.2 cm which could be a prior area of omental infarct/fat necrosis. Musculoskeletal: No acute or significant osseous findings. Degenerative changes seen in the thoracolumbar spine. IMPRESSION: Ground-glass opacities seen at both lung bases which may be due to inflammatory or early infectious etiology. No acute intra-abdominal or pelvic pathology to explain the patient's symptoms. Aortic Atherosclerosis (ICD10-I70.0). Electronically Signed   By: Prudencio Pair M.D.   On: 01/28/2020 02:20   DG Chest Port 1 View  Result Date: 01/27/2020 CLINICAL DATA:  Sepsis.  Nausea and fatigue. EXAM: PORTABLE CHEST 1 VIEW COMPARISON:  04/11/2018 FINDINGS: Lung volumes are low. Normal heart size with mild aortic tortuosity and atherosclerosis. Minimal patchy opacity at the left greater than right lung base. No pulmonary edema. No pleural fluid or pneumothorax. Degenerative change in the spine. No acute osseous abnormalities are seen. IMPRESSION: Low lung volumes with patchy left greater than right basilar opacity, favor atelectasis. Pneumonia could have a similar appearance in the appropriate clinical setting. Electronically Signed   By: Keith Rake M.D.   On: 01/27/2020 23:24

## 2020-01-30 NOTE — Progress Notes (Signed)
Occupational Therapy Evaluation Patient Details Name: Joan Elliott MRN: [id number] DOB: June 26, 1941 Today's Date: 01/30/2020    History of Present Illness Patient is a 79 y.o. female with PMHx of CAD s/p PCI 2003, CKD stage IIIa, HTN, tobacco abuse, HLD, hypothyroidism who presented with weakness, fatigue and shortness of breath along with fever-found to have COVID-19 pneumonia and admitted to the hospitalist service.   Clinical Impression   Patient lives alone in a level entry apartment.  She is independent at prior level.  Patient on room air today and SpO2 maintained > 93 throughout session, even with mobility.  She did have some shortness of breath, though, after standing groom activity and stated she felt very "drained".  She is able to complete UB  ADLs standing with supervision and LB ADLs with min guard.  Would benefit from further OT to work on improving activity tolerance and strength for increased independence and safety.     Follow Up Recommendations  Home health OT    Equipment Recommendations  None recommended by OT    Recommendations for Other Services       Precautions / Restrictions Precautions Precautions: Fall      Mobility Bed Mobility Overal bed mobility: Modified Independent             General bed mobility comments: Up in chair  Transfers Overall transfer level: Needs assistance   Transfers: Sit to/from Stand Sit to Stand: Supervision         General transfer comment: cues for better safety    Balance Overall balance assessment: No apparent balance deficits (not formally assessed)                                         ADL either performed or assessed with clinical judgement   ADL Overall ADL's : Needs assistance/impaired Eating/Feeding: Independent;Sitting   Grooming: Supervision/safety;Standing   Upper Body Bathing: Supervision/ safety;Standing   Lower Body Bathing: Min guard;Sit to/from stand   Upper Body  Dressing : Supervision/safety;Standing   Lower Body Dressing: Min guard;Sit to/from Archivist: Ambulation;Regular Toilet;Supervision/safety   Toileting- Clothing Manipulation and Hygiene: Supervision/safety;Sitting/lateral lean       Functional mobility during ADLs: Warden/ranger      Pertinent Vitals/Pain Pain Assessment: No/denies pain     Hand Dominance     Extremity/Trunk Assessment Upper Extremity Assessment Upper Extremity Assessment: Generalized weakness   Lower Extremity Assessment Lower Extremity Assessment: Defer to PT evaluation       Communication Communication Communication: No difficulties   Cognition Arousal/Alertness: Awake/alert Behavior During Therapy: WFL for tasks assessed/performed Overall Cognitive Status: Within Functional Limits for tasks assessed                                     General Comments  has no assist resources at home, but want to go home .    Exercises     Shoulder Instructions      Home Living Family/patient expects to be discharged to:: Private residence Living Arrangements: Alone Available Help at Discharge: Other (Comment) (limited assist) Type of Home: Apartment Home Access: Level entry     Home Layout: One level  Bathroom Shower/Tub: Teacher, early years/pre: Handicapped height Bathroom Accessibility: Yes   Home Equipment: None          Prior Functioning/Environment Level of Independence: Independent                 OT Problem List: Decreased strength;Decreased activity tolerance;Cardiopulmonary status limiting activity      OT Treatment/Interventions: Self-care/ADL training;Therapeutic exercise;Energy conservation;Therapeutic activities;Patient/family education    OT Goals(Current goals can be found in the care plan section) Acute Rehab OT Goals Patient Stated Goal: home independent OT Goal Formulation:  With patient Time For Goal Achievement: 02/13/20 Potential to Achieve Goals: Good  OT Frequency: Min 2X/week   Barriers to D/C:            Co-evaluation              AM-PAC OT "6 Clicks" Daily Activity     Outcome Measure Help from another person eating meals?: None Help from another person taking care of personal grooming?: A Little Help from another person toileting, which includes using toliet, bedpan, or urinal?: A Little Help from another person bathing (including washing, rinsing, drying)?: A Little Help from another person to put on and taking off regular upper body clothing?: A Little Help from another person to put on and taking off regular lower body clothing?: A Little 6 Click Score: 19   End of Session Nurse Communication: Mobility status  Activity Tolerance: Patient tolerated treatment well Patient left: in chair;with call bell/phone within reach  OT Visit Diagnosis: Muscle weakness (generalized) (M62.81);Other (comment) (decreased cardiopulminary status)                Time: 1310-1330 OT Time Calculation (min): 20 min Charges:  OT General Charges $OT Visit: 1 Visit OT Evaluation $OT Eval Moderate Complexity: 1 [id number], OTR/L   Phylliss Bob 01/30/2020, 2:56 PM

## 2020-01-31 LAB — COMPREHENSIVE METABOLIC PANEL
ALT: 17 U/L (ref 0–44)
AST: 26 U/L (ref 15–41)
Albumin: 2.8 g/dL — ABNORMAL LOW (ref 3.5–5.0)
Alkaline Phosphatase: 68 U/L (ref 38–126)
Anion gap: 10 (ref 5–15)
BUN: 20 mg/dL (ref 8–23)
CO2: 21 mmol/L — ABNORMAL LOW (ref 22–32)
Calcium: 8.4 mg/dL — ABNORMAL LOW (ref 8.9–10.3)
Chloride: 108 mmol/L (ref 98–111)
Creatinine, Ser: 1.56 mg/dL — ABNORMAL HIGH (ref 0.44–1.00)
GFR calc Af Amer: 36 mL/min — ABNORMAL LOW (ref >60)
GFR calc non Af Amer: 31 mL/min — ABNORMAL LOW (ref >60)
Glucose, Bld: 85 mg/dL (ref 70–99)
Potassium: 4.9 mmol/L (ref 3.5–5.1)
Sodium: 139 mmol/L (ref 135–145)
Total Bilirubin: 1 mg/dL (ref 0.3–1.2)
Total Protein: 5.9 g/dL — ABNORMAL LOW (ref 6.5–8.1)

## 2020-01-31 LAB — CBC WITH DIFFERENTIAL/PLATELET
Abs Immature Granulocytes: 0.05 10*3/uL (ref 0.00–0.07)
Basophils Absolute: 0 10*3/uL (ref 0.0–0.1)
Basophils Relative: 0 %
Eosinophils Absolute: 0 10*3/uL (ref 0.0–0.5)
Eosinophils Relative: 0 %
HCT: 32.3 % — ABNORMAL LOW (ref 36.0–46.0)
Hemoglobin: 10.6 g/dL — ABNORMAL LOW (ref 12.0–15.0)
Immature Granulocytes: 1 %
Lymphocytes Relative: 30 %
Lymphs Abs: 1.5 10*3/uL (ref 0.7–4.0)
MCH: 25.1 pg — ABNORMAL LOW (ref 26.0–34.0)
MCHC: 32.8 g/dL (ref 30.0–36.0)
MCV: 76.5 fL — ABNORMAL LOW (ref 80.0–100.0)
Monocytes Absolute: 0.4 10*3/uL (ref 0.1–1.0)
Monocytes Relative: 8 %
Neutro Abs: 3.1 10*3/uL (ref 1.7–7.7)
Neutrophils Relative %: 61 %
Platelets: 317 10*3/uL (ref 150–400)
RBC: 4.22 MIL/uL (ref 3.87–5.11)
RDW: 18 % — ABNORMAL HIGH (ref 11.5–15.5)
WBC: 5.1 10*3/uL (ref 4.0–10.5)
nRBC: 0 % (ref 0.0–0.2)

## 2020-01-31 LAB — D-DIMER, QUANTITATIVE: D-Dimer, Quant: 1.13 ug/mL-FEU — ABNORMAL HIGH (ref 0.00–0.50)

## 2020-01-31 LAB — FERRITIN: Ferritin: 94 ng/mL (ref 11–307)

## 2020-01-31 LAB — C-REACTIVE PROTEIN: CRP: 1.4 mg/dL — ABNORMAL HIGH (ref <1.0)

## 2020-01-31 NOTE — TOC Initial Note (Signed)
Transition of Care [facility]) - Initial/Assessment Note    Patient Details  Name: Joan Elliott MRN: [id number] Date of Birth: 1940/11/04  Transition of Care [facility]) CM/SW Contact:    Benard Halsted, LCSW Phone Number: 01/31/2020, 2:36 PM  Clinical Narrative:                 CSW received consult for possible home health services at time of discharge. CSW spoke with patient regarding PT recommendation of Home Health PT at time of discharge. Patient reported that she would like home health services. CSW provided Medicare [facility] ratings list. Patient reports preference for the one with the best ratings in network with [employer]. Referral accepted by [facility] for PT/OT. CSW confirmed PCP and address with patient. Patient states her friend will come pick her up at discharge and confirmed she will wear a mask due to COVID + status. No further questions reported at this time.   Expected Discharge Plan: [place] Barriers to Discharge: Continued Medical Work up   Patient Goals and CMS Choice Patient states their goals for this hospitalization and ongoing recovery are:: Return home CMS Medicare.gov Compare Post Acute Care list provided to:: Patient Choice offered to / list presented to : Patient  Expected Discharge Plan and Services Expected Discharge Plan: [place]   Discharge Planning Services: CM Consult Post Acute Care Choice: [place] arrangements for the past 2 months: Apartment                 DME Arranged: N/A         HH Arranged: PT, OT HH Agency: [place] Date [facility] Agency Contacted: 01/31/20 Time [place]: [phone] Representative spoke with at [place]: Georgina Snell  Prior Living Arrangements/Services Living arrangements for the past 2 months: Apartment Lives with:: Self Patient language and need for interpreter reviewed:: Yes Do you feel safe going back to the place where you live?: Yes      Need for Family Participation in  Patient Care: No (Comment) Care giver support system in place?: Yes (comment)   Criminal Activity/Legal Involvement Pertinent to Current Situation/Hospitalization: No - Comment as needed  Activities of Daily Living   ADL Screening (condition at time of admission) Patient's cognitive ability adequate to safely complete daily activities?: Yes Is the patient deaf or have difficulty hearing?: No Does the patient have difficulty seeing, even when wearing glasses/contacts?: No Does the patient have difficulty concentrating, remembering, or making decisions?: No Patient able to express need for assistance with ADLs?: Yes Does the patient have difficulty dressing or bathing?: No Independently performs ADLs?: Yes (appropriate for developmental age) Does the patient have difficulty walking or climbing stairs?: No Weakness of Legs: None Weakness of Arms/Hands: None  Permission Sought/Granted Permission sought to share information with : Facility Art therapist granted to share information with : Yes, Verbal Permission Granted     Permission granted to share info w AGENCY: Home Health        Emotional Assessment   Attitude/Demeanor/Rapport: Engaged, Gracious Affect (typically observed): Accepting, Appropriate Orientation: : Oriented to Self, Oriented to Place, Oriented to  Time, Oriented to Situation Alcohol / Substance Use: Not Applicable Psych Involvement: No (comment)  Admission diagnosis:  CAP (community acquired pneumonia) [J18.9] Community acquired pneumonia, unspecified laterality [J18.9] Sepsis with encephalopathy without septic shock, due to unspecified organism ([place]) [A41.9, R65.20, G93.40] Pneumonia due to COVID-19 virus [U07.1, J12.82] Patient Active Problem List  Diagnosis Date Noted  . CAP (community acquired pneumonia) 01/28/2020  . Pneumonia due to COVID-19 virus 01/28/2020  . Dyslipidemia 04/07/2019  . Ileus following gastrointestinal surgery ([place])    . AKI (acute kidney injury) ([place]) 04/05/2018  . Acute perforated appendicitis 04/05/2018  . HTN (hypertension) 04/05/2018  . Aortic atherosclerosis ([place]) 02/01/2018  . Morbid obesity ([place]) 02/01/2018  . CKD (chronic kidney disease) stage 3, GFR 30-59 ml/min 02/01/2018  . Peripheral vascular disease ([place]) 02/01/2018  . Nonspecific chest pain 02/23/2015  . Hypertensive heart disease without CHF   . Coronary artery disease   . Tobacco abuse   . Hypothyroidism   . HLD (hyperlipidemia)   . Gout   . GERD (gastroesophageal reflux disease)    PCP:  [name], MD Pharmacy:   [place], [place] - [address] [place] [address] Phone: [phone] Fax: [phone]  [facility] [place], [place] [address] Phone: [phone] Fax: [phone]  [facility] [place], [place] [address] Phone: [phone] Fax: [phone]     Social Determinants of Health (SDOH) Interventions    Readmission Risk Interventions No flowsheet data found.

## 2020-01-31 NOTE — Progress Notes (Signed)
PROGRESS NOTE                                                                                                                                                                                                             Patient Demographics:    Joan Elliott, is a 79 y.o. female, DOB - 01/01/1941, SWN:[id number]  Outpatient Primary MD for the patient is [name], MD   Admit date - 01/27/2020   LOS - 3  Chief Complaint  Patient presents with  . Fatigue  . Nausea  . Emesis       Brief Narrative: Patient is a 79 y.o. female with PMHx of CAD s/p PCI 2003, CKD stage IIIa, HTN, tobacco abuse, HLD, hypothyroidism who presented with weakness, fatigue and shortness of breath along with fever-found to have COVID-19 pneumonia and admitted to the hospitalist service.  See below for further details.  Significant Events: 7/26>> admit to [facility]-for weakness/fever-found to have COVID-19 pneumonia  Significant studies: 7/26>> chest x-ray: Patchy left greater than right basilar opacity 7/27>> CT abdomen/pelvis: Groundglass opacities seen at both lung bases.  No acute intra abdominal or pelvic pathology.  COVID-19 medications: Remdesivir: 7/26>> Decadron: 7/27>>7/29  Antibiotics: None  Microbiology data: 7/26>> blood culture: no growth 7/27>> blood culture: no growth 7/27>> urine culture: Multiple species  Procedures: None  Consults: None  DVT prophylaxis: enoxaparin (LOVENOX) injection 40 mg Start: 01/28/20 1400    Subjective:   Feels better-no major issues overnight.   Assessment  & Plan :   Covid 19 Viral pneumonia: Slowly improving-inflammatory markers downtrending-not hypoxic-continue IV Remdesivir. Given risk of severe disease given advanced age and multiple comorbidities-reasonable to continue with IV Remdesivir x5 days.  Fever: afebrile O2 requirements:  SpO2: 96 %  COVID-19 Labs: Recent Labs     01/29/20 0333 01/30/20 0428 01/31/20 0634  DDIMER 1.81* 1.05* 1.13*  FERRITIN 101 105 94  CRP  --  3.8* 1.4*       Component Value Date/Time   BNP 48.1 02/23/2015 1941    Recent Labs  Lab 01/28/20 0841  PROCALCITON <0.10    Lab Results  Component Value Date   SARSCOV2NAA POSITIVE (A) 01/28/2020     Prone/Incentive Spirometry: encouragedincentive spirometry use 3-4/hour.  AKI CKD stage IIIa: Improved-creatinine slowly downtrending-continue to follow periodically.  Avoid nephrotoxic agents.   CAD s/p  PCI 2003: No anginal symptoms-continue Plavix, Imdur, beta-blocker and statin.  Anemia: Stable-likely from CKD-stable for follow-up.  Hypothyroidism: Continue Synthroid  Tobacco abuse: Counseled  Chronic low back pain: Followed by orthopedics-nonfocal exam-we will need MRI at some point-suspect will be done in the outpatient setting.   Obesity: Estimated body mass index is 37.5 kg/m as calculated from the following:   Height as of an earlier encounter on 01/27/20: 5' (1.524 m).   Weight as of an earlier encounter on 01/27/20: 87.1 kg.   ABG:    Component Value Date/Time   TCO2 25 03/11/2017 1545    Vent Settings: N/A  Condition - Stable  Family Communication  Marland Kitchen  Joelene Millin- [phone] 7/28   Code Status :  Full Code  Diet :  Diet Order            Diet Heart Room service appropriate? Yes; Fluid consistency: Thin  Diet effective now                  Disposition Plan  :   Status is: Inpatient  Remains inpatient appropriate because:Inpatient level of care appropriate due to severity of illness   Dispo: The patient is from: Home              Anticipated d/c is to: Home              Anticipated d/c date is: 1 days 7/31              Patient currently is not medically stable to d/c.   Barriers to discharge: Hypoxia requiring O2 supplementation/complete 5 days of IV Remdesivir  Antimicorbials  :    Anti-infectives (From admission, onward)    Start     Dose/Rate Route Frequency Ordered Stop   01/29/20 1000  remdesivir 100 mg in sodium chloride 0.9 % 100 mL IVPB     Discontinue    "Followed by" Linked Group Details   100 mg 200 mL/hr over 30 Minutes Intravenous Daily 01/28/20 0456 02/02/20 0959   01/28/20 0600  remdesivir 200 mg in sodium chloride 0.9% 250 mL IVPB       "Followed by" Linked Group Details   200 mg 580 mL/hr over 30 Minutes Intravenous Once 01/28/20 0456 01/28/20 0720   01/27/20 2330  azithromycin (ZITHROMAX) 500 mg in sodium chloride 0.9 % 250 mL IVPB        500 mg 250 mL/hr over 60 Minutes Intravenous  Once 01/27/20 2327 01/28/20 0241   01/27/20 2315  cefTRIAXone (ROCEPHIN) 1 g in sodium chloride 0.9 % 100 mL IVPB  Status:  Discontinued        1 g 200 mL/hr over 30 Minutes Intravenous Every 24 hours 01/27/20 2308 01/28/20 0453      Inpatient Medications  Scheduled Meds: . allopurinol  100 mg Oral Daily  . vitamin C  500 mg Oral Daily  . clopidogrel  75 mg Oral Daily  . docusate sodium  100 mg Oral BID  . enoxaparin (LOVENOX) injection  40 mg Subcutaneous Q24H  . gabapentin  100 mg Oral QHS  . isosorbide mononitrate  30 mg Oral Daily  . levothyroxine  50 mcg Oral Q0600  . metoprolol tartrate  12.5 mg Oral BID  . oxybutynin  5 mg Oral BID  . pantoprazole  40 mg Oral BID  . rosuvastatin  40 mg Oral Daily  . zinc sulfate  220 mg Oral Daily   Continuous Infusions: . remdesivir 100 mg in  NS 100 mL 100 mg (01/31/20 1033)   PRN Meds:.cyclobenzaprine, guaiFENesin-dextromethorphan, HYDROcodone-acetaminophen, nitroGLYCERIN, ondansetron **OR** ondansetron (ZOFRAN) IV   Time Spent in minutes  25   See all Orders from today for further details   Oren Binet M.D on 01/31/2020 at 2:08 PM  To page go to [email or web address] - use universal password  [facility] -  Office  [phone]    Objective:   Vitals:   01/29/20 2009 01/30/20 0400 01/30/20 1417 01/30/20 2019  BP: (!) 129/67 124/77 119/81  122/79  Pulse: 80 49 65 70  Resp: 18 20 20 18   Temp: 98.1 F (36.7 C) 98.1 F (36.7 C) 97.8 F (36.6 C) 98.5 F (36.9 C)  TempSrc: Oral Oral Oral Oral  SpO2: 96% 97% 98% 96%    Wt Readings from Last 3 Encounters:  01/27/20 87.1 kg  04/08/19 87.1 kg  04/16/18 93.4 kg     Intake/Output Summary (Last 24 hours) at 01/31/2020 1408 Last data filed at 01/31/2020 0517 Gross per 24 hour  Intake 300 ml  Output 900 ml  Net -600 ml     Physical Exam Gen Exam:Alert awake-not in any distress HEENT:atraumatic, normocephalic Chest: B/L clear to auscultation anteriorly CVS:S1S2 regular Abdomen:soft non tender, non distended Extremities:no edema Neurology: Non focal Skin: no rash   Data Review:    CBC Recent Labs  Lab 01/27/20 2301 01/28/20 0841 01/29/20 0333 01/30/20 0428 01/31/20 1050  WBC 3.8* 2.8* 2.1* 3.3* 5.1  HGB 11.8* 11.5* 11.5* 10.6* 10.6*  HCT 36.4 36.0 35.1* 32.9* 32.3*  PLT 207 249 251 283 317  MCV 77.9* 78.9* 77.7* 78.3* 76.5*  MCH 25.3* 25.2* 25.4* 25.2* 25.1*  MCHC 32.4 31.9 32.8 32.2 32.8  RDW 18.4* 18.5* 18.3* 18.4* 18.0*  LYMPHSABS 0.7  --  0.2* 0.7 1.5  MONOABS 0.3  --  0.0* 0.3 0.4  EOSABS 0.0  --  0.0 0.0 0.0  BASOSABS 0.0  --  0.0 0.0 0.0    Chemistries  Recent Labs  Lab 01/27/20 2301 01/27/20 2301 01/28/20 0453 01/28/20 0841 01/29/20 0333 01/30/20 0428 01/31/20 0634  NA 138  --   --  141 140 139 139  K 3.5  --   --  3.9 4.2 4.3 4.9  CL 105  --   --  106 108 108 108  CO2 20*  --   --  24 20* 21* 21*  GLUCOSE 88  --   --  87 114* 123* 85  BUN 12  --   --  11 16 23 20   CREATININE 1.33*   < > 1.22* 1.36* 1.27* 1.67* 1.56*  CALCIUM 8.5*  --   --  8.5* 8.5* 8.6* 8.4*  AST 33  --   --  30 29 23 26   ALT 22  --   --  20 20 17 17   ALKPHOS 92  --   --  78 79 70 68  BILITOT 0.8  --   --  0.7 1.0 0.5 1.0   < > = values in this interval not displayed.    ------------------------------------------------------------------------------------------------------------------ No results for input(s): CHOL, HDL, LDLCALC, TRIG, CHOLHDL, LDLDIRECT in the last 72 hours.  Lab Results  Component Value Date   HGBA1C 6.3 (H) 02/23/2015   ------------------------------------------------------------------------------------------------------------------ No results for input(s): TSH, T4TOTAL, T3FREE, THYROIDAB in the last 72 hours.  Invalid input(s): FREET3 ------------------------------------------------------------------------------------------------------------------ Recent Labs    01/30/20 0428 01/31/20 0634  FERRITIN 105 94    Coagulation profile Recent Labs  Lab  01/27/20 2301  INR 1.0    Recent Labs    01/30/20 0428 01/31/20 0634  DDIMER 1.05* 1.13*    Cardiac Enzymes No results for input(s): CKMB, TROPONINI, MYOGLOBIN in the last 168 hours.  Invalid input(s): CK ------------------------------------------------------------------------------------------------------------------    Component Value Date/Time   BNP 48.1 02/23/2015 1941    Micro Results Recent Results (from the past 240 hour(s))  Blood Culture (routine x 2)     Status: None (Preliminary result)   Collection Time: 01/27/20 11:57 PM   Specimen: BLOOD RIGHT WRIST  Result Value Ref Range Status   Specimen Description BLOOD RIGHT WRIST  Final   Special Requests   Final    BOTTLES DRAWN AEROBIC AND ANAEROBIC Blood Culture adequate volume   Culture   Final    NO GROWTH 2 DAYS Performed at [facility], [address], [place], [place] [address]    Report Status PENDING  Incomplete  Urine Culture     Status: Abnormal   Collection Time: 01/28/20 12:24 AM   Specimen: Urine, Random  Result Value Ref Range Status   Specimen Description URINE, RANDOM  Final   Special Requests   Final    NONE Performed at [facility], [place] [address] [place], [place]  [address]    Culture MULTIPLE SPECIES PRESENT, SUGGEST RECOLLECTION (A)  Final   Report Status 01/29/2020 FINAL  Final  SARS Coronavirus 2 by RT PCR (hospital order, performed in [facility] hospital lab) Nasopharyngeal Nasopharyngeal Swab     Status: Abnormal   Collection Time: 01/28/20 12:24 AM   Specimen: Nasopharyngeal Swab  Result Value Ref Range Status   SARS Coronavirus 2 POSITIVE (A) NEGATIVE Final    Comment: RESULT CALLED TO, READ BACK BY AND VERIFIED WITH: RN HARRIS C. AT [place] 01/28/2020 (NOTE) SARS-CoV-2 target nucleic acids are DETECTED  SARS-CoV-2 RNA is generally detectable in upper respiratory specimens  during the acute phase of infection.  Positive results are indicative  of the presence of the identified virus, but do not rule out bacterial infection or co-infection with other pathogens not detected by the test.  Clinical correlation with patient history and  other diagnostic information is necessary to determine patient infection status.  The expected result is negative.  Fact Sheet for Patients:   StrictlyIdeas.no   Fact Sheet for Healthcare Providers:   BankingDealers.co.za    This test is not yet approved or cleared by the [place] FDA and  has been authorized for detection and/or diagnosis of SARS-CoV-2 by FDA under an Emergency Use Authorization (EUA).  This EUA will remain in effect (me aning this test can be used) for the duration of  the COVID-19 declaration under Section 564(b)(1) of the Act, 21 U.S.C. section 360-bbb-3(b)(1), unless the authorization is terminated or revoked sooner.  Performed at [facility], [place] [address] [place], [place] [address]   Blood Culture (routine x 2)     Status: None (Preliminary result)   Collection Time: 01/28/20  1:33 AM   Specimen: BLOOD  Result Value Ref Range Status   Specimen Description BLOOD SITE NOT SPECIFIED  Final   Special Requests    Final    BOTTLES DRAWN AEROBIC AND ANAEROBIC Blood Culture adequate volume   Culture   Final    NO GROWTH 2 DAYS Performed at [facility], [address] [place],  [address]    Report Status PENDING  Incomplete  Radiology Reports CT ABDOMEN PELVIS WO CONTRAST  Result Date: 01/28/2020 CLINICAL DATA:  Sepsis, weakness fatigue vomiting EXAM: CT ABDOMEN AND PELVIS WITHOUT CONTRAST TECHNIQUE: Multidetector CT imaging of the abdomen and pelvis was performed following the standard protocol without IV contrast. COMPARISON:  None. FINDINGS: Lower chest: The visualized heart size within normal limits. No pericardial fluid/thickening. There is a small hiatal hernia present. Ground-glass opacities are seen at both lung bases, predominantly within the periphery. Hepatobiliary: The liver is normal in density without focal abnormality.The main portal vein is patent. The patient is status post cholecystectomy. No biliary ductal dilation. Pancreas: Unremarkable. No pancreatic ductal dilatation or surrounding inflammatory changes. Spleen: Normal in size without focal abnormality. Adrenals/Urinary Tract: Both adrenal glands appear normal. The kidneys and collecting system appear normal without evidence of urinary tract calculus or hydronephrosis. Bladder is unremarkable. Stomach/Bowel: The stomach, small bowel, and colon are normal in appearance. No inflammatory changes, wall thickening, or obstructive findings. The patient has had a prior right hemicolectomy and appendectomy. Vascular/Lymphatic: There are no enlarged mesenteric, retroperitoneal, or pelvic lymph nodes. Scattered aortic atherosclerotic calcifications are seen without aneurysmal dilatation. Reproductive: The patient is status post hysterectomy. No adnexal masses or collections seen. Other: There is a small abdominal wall hernia containing the tiny loop of bowel. No evidence of bowel strangulation is seen. Within the right lower quadrant  there is a loculated area of fat with fat stranding changes measuring approximately 3.2 cm which could be a prior area of omental infarct/fat necrosis. Musculoskeletal: No acute or significant osseous findings. Degenerative changes seen in the thoracolumbar spine. IMPRESSION: Ground-glass opacities seen at both lung bases which may be due to inflammatory or early infectious etiology. No acute intra-abdominal or pelvic pathology to explain the patient's symptoms. Aortic Atherosclerosis (ICD10-I70.0). Electronically Signed   By: Prudencio Pair M.D.   On: 01/28/2020 02:20   DG Chest Port 1 View  Result Date: 01/27/2020 CLINICAL DATA:  Sepsis.  Nausea and fatigue. EXAM: PORTABLE CHEST 1 VIEW COMPARISON:  04/11/2018 FINDINGS: Lung volumes are low. Normal heart size with mild aortic tortuosity and atherosclerosis. Minimal patchy opacity at the left greater than right lung base. No pulmonary edema. No pleural fluid or pneumothorax. Degenerative change in the spine. No acute osseous abnormalities are seen. IMPRESSION: Low lung volumes with patchy left greater than right basilar opacity, favor atelectasis. Pneumonia could have a similar appearance in the appropriate clinical setting. Electronically Signed   By: Keith Rake M.D.   On: 01/27/2020 23:24

## 2020-02-01 DIAGNOSIS — N179 Acute kidney failure, unspecified: Secondary | ICD-10-CM

## 2020-02-01 DIAGNOSIS — E038 Other specified hypothyroidism: Secondary | ICD-10-CM

## 2020-02-01 LAB — CBC WITH DIFFERENTIAL/PLATELET
Abs Immature Granulocytes: 0.05 10*3/uL (ref 0.00–0.07)
Basophils Absolute: 0 10*3/uL (ref 0.0–0.1)
Basophils Relative: 0 %
Eosinophils Absolute: 0 10*3/uL (ref 0.0–0.5)
Eosinophils Relative: 1 %
HCT: 33.4 % — ABNORMAL LOW (ref 36.0–46.0)
Hemoglobin: 10.8 g/dL — ABNORMAL LOW (ref 12.0–15.0)
Immature Granulocytes: 1 %
Lymphocytes Relative: 33 %
Lymphs Abs: 1.2 10*3/uL (ref 0.7–4.0)
MCH: 25.3 pg — ABNORMAL LOW (ref 26.0–34.0)
MCHC: 32.3 g/dL (ref 30.0–36.0)
MCV: 78.2 fL — ABNORMAL LOW (ref 80.0–100.0)
Monocytes Absolute: 0.3 10*3/uL (ref 0.1–1.0)
Monocytes Relative: 10 %
Neutro Abs: 2 10*3/uL (ref 1.7–7.7)
Neutrophils Relative %: 55 %
Platelets: 323 10*3/uL (ref 150–400)
RBC: 4.27 MIL/uL (ref 3.87–5.11)
RDW: 18.4 % — ABNORMAL HIGH (ref 11.5–15.5)
WBC: 3.6 10*3/uL — ABNORMAL LOW (ref 4.0–10.5)
nRBC: 0 % (ref 0.0–0.2)

## 2020-02-01 LAB — COMPREHENSIVE METABOLIC PANEL
ALT: 20 U/L (ref 0–44)
AST: 29 U/L (ref 15–41)
Albumin: 2.7 g/dL — ABNORMAL LOW (ref 3.5–5.0)
Alkaline Phosphatase: 71 U/L (ref 38–126)
Anion gap: 10 (ref 5–15)
BUN: 17 mg/dL (ref 8–23)
CO2: 21 mmol/L — ABNORMAL LOW (ref 22–32)
Calcium: 8.4 mg/dL — ABNORMAL LOW (ref 8.9–10.3)
Chloride: 110 mmol/L (ref 98–111)
Creatinine, Ser: 1.48 mg/dL — ABNORMAL HIGH (ref 0.44–1.00)
GFR calc Af Amer: 38 mL/min — ABNORMAL LOW (ref >60)
GFR calc non Af Amer: 33 mL/min — ABNORMAL LOW (ref >60)
Glucose, Bld: 82 mg/dL (ref 70–99)
Potassium: 3.9 mmol/L (ref 3.5–5.1)
Sodium: 141 mmol/L (ref 135–145)
Total Bilirubin: 0.5 mg/dL (ref 0.3–1.2)
Total Protein: 5.8 g/dL — ABNORMAL LOW (ref 6.5–8.1)

## 2020-02-01 LAB — D-DIMER, QUANTITATIVE: D-Dimer, Quant: 1.06 ug/mL-FEU — ABNORMAL HIGH (ref 0.00–0.50)

## 2020-02-01 LAB — C-REACTIVE PROTEIN: CRP: 2.9 mg/dL — ABNORMAL HIGH (ref <1.0)

## 2020-02-01 LAB — FERRITIN: Ferritin: 86 ng/mL (ref 11–307)

## 2020-02-01 NOTE — Discharge Summary (Signed)
PATIENT DETAILS Name: Joan Elliott Age: 79 y.o. Sex: female Date of Birth: [birth year] MRN: [id number]. Admitting Physician: Rise Patience, MD [id number], Christean Grief, MD  Admit Date: 01/27/2020 Discharge date: 02/01/2020  Recommendations for Outpatient Follow-up:  1. Follow up with PCP in 1-2 weeks 2. Please obtain CMP/CBC in one week  Admitted From:  Home  Disposition: Home with home health [place]:  Yes  Equipment/Devices: None  Discharge Condition: Stable  CODE STATUS: FULL CODE  Diet recommendation:  Diet Order            Diet - low sodium heart healthy           Diet Heart Room service appropriate? Yes; Fluid consistency: Thin  Diet effective now                  Brief Narrative: Patient is a 79 y.o. female with PMHx of CAD s/p PCI 2003, CKD stage IIIa, HTN, tobacco abuse, HLD, hypothyroidism who presented with weakness, fatigue and shortness of breath along with fever-found to have COVID-19 pneumonia and admitted to the hospitalist service.  See below for further details.  Significant Events: 7/26>> admit to [facility]-for weakness/fever-found to have COVID-19 pneumonia  Significant studies: 7/26>> chest x-ray: Patchy left greater than right basilar opacity 7/27>> CT abdomen/pelvis: Groundglass opacities seen at both lung bases.  No acute intra abdominal or pelvic pathology.  COVID-19 medications: Remdesivir: 7/26>>7/31 Decadron: 7/27>>7/29  Antibiotics: None  Microbiology data: 7/26>> blood culture: no growth 7/27>> blood culture: no growth 7/27>> urine culture: Multiple species  Procedures: None  Consults: None  Brief Hospital Course: Covid 19 Viral pneumonia:  Much better-not requiring O2 for the past few days-treated with Remdesivir-was on steroids for a few days.  She is at baseline-and stable for discharge.  Home health services have been arranged.  COVID-19 Labs:  Recent Labs    01/30/20 0428  01/31/20 0634 02/01/20 0624  DDIMER 1.05* 1.13* 1.06*  FERRITIN 105 94 86  CRP 3.8* 1.4* 2.9*    Lab Results  Component Value Date   SARSCOV2NAA POSITIVE (A) 01/28/2020     AKI CKD stage IIIa: Improved-creatinine slowly downtrending-continue to follow periodically.  Avoid nephrotoxic agents.   CAD s/p PCI 2003: No anginal symptoms-continue Plavix, Imdur, beta-blocker and statin.  Anemia: Stable-likely from CKD-stable for follow-up.  Hypothyroidism: Continue Synthroid  Tobacco abuse: Counseled  Chronic low back pain: Followed by orthopedics-nonfocal exam-we will need MRI at some point-suspect will be done in the outpatient setting.  Obesity: Estimated body mass index is 37.5 kg/m as calculated from the following:   Height as of an earlier encounter on 01/27/20: 5' (1.524 m).   Weight as of an earlier encounter on 01/27/20: 87.1 kg.   Discharge Diagnoses:  Principal Problem:   Pneumonia due to COVID-19 virus Active Problems:   Coronary artery disease   Tobacco abuse   Hypothyroidism   HLD (hyperlipidemia)   Gout   Morbid obesity (HCC)   CKD (chronic kidney disease) stage 3, GFR 30-59 ml/min   Dyslipidemia   CAP (community acquired pneumonia)   Discharge Instructions:    Person Under Monitoring Name: Joan Elliott  Location: [place] [address]   Infection Prevention Recommendations for Individuals Confirmed to have, or Being Evaluated for, 2019 Novel Coronavirus (COVID-19) Infection Who Receive Care at Home  Individuals who are confirmed to have, or are being evaluated for, COVID-19 should follow the prevention steps below until a  healthcare provider or local or state health department says they can return to normal activities.  Stay home except to get medical care You should restrict activities outside your home, except for getting medical care. Do not go to work, school, or public areas, and do not use public  transportation or taxis.  Call ahead before visiting your doctor Before your medical appointment, call the healthcare provider and tell them that you have, or are being evaluated for, COVID-19 infection. This will help the healthcare provider's office take steps to keep other people from getting infected. Ask your healthcare provider to call the local or state health department.  Monitor your symptoms Seek prompt medical attention if your illness is worsening (e.g., difficulty breathing). Before going to your medical appointment, call the healthcare provider and tell them that you have, or are being evaluated for, COVID-19 infection. Ask your healthcare provider to call the local or state health department.  Wear a facemask You should wear a facemask that covers your nose and mouth when you are in the same room with other people and when you visit a healthcare provider. People who live with or visit you should also wear a facemask while they are in the same room with you.  Separate yourself from other people in your home As much as possible, you should stay in a different room from other people in your home. Also, you should use a separate bathroom, if available.  Avoid sharing household items You should not share dishes, drinking glasses, cups, eating utensils, towels, bedding, or other items with other people in your home. After using these items, you should wash them thoroughly with soap and water.  Cover your coughs and sneezes Cover your mouth and nose with a tissue when you cough or sneeze, or you can cough or sneeze into your sleeve. Throw used tissues in a lined trash can, and immediately wash your hands with soap and water for at least 20 seconds or use an alcohol-based hand rub.  Wash your [facility] your hands often and thoroughly with soap and water for at least 20 seconds. You can use an alcohol-based hand sanitizer if soap and water are not available and if your hands  are not visibly dirty. Avoid touching your eyes, nose, and mouth with unwashed hands.   Prevention Steps for Caregivers and Household Members of Individuals Confirmed to have, or Being Evaluated for, COVID-19 Infection Being Cared for in the Home  If you live with, or provide care at home for, a person confirmed to have, or being evaluated for, COVID-19 infection please follow these guidelines to prevent infection:  Follow healthcare provider's instructions Make sure that you understand and can help the patient follow any healthcare provider instructions for all care.  Provide for the patient's basic needs You should help the patient with basic needs in the home and provide support for getting groceries, prescriptions, and other personal needs.  Monitor the patient's symptoms If they are getting sicker, call his or her medical provider and tell them that the patient has, or is being evaluated for, COVID-19 infection. This will help the healthcare provider's office take steps to keep other people from getting infected. Ask the healthcare provider to call the local or state health department.  Limit the number of people who have contact with the patient  If possible, have only one caregiver for the patient.  Other household members should stay in another home or place of residence. If this is not possible,  they should stay  in another room, or be separated from the patient as much as possible. Use a separate bathroom, if available.  Restrict visitors who do not have an essential need to be in the home.  Keep older adults, very young children, and other sick people away from the patient Keep older adults, very young children, and those who have compromised immune systems or chronic health conditions away from the patient. This includes people with chronic heart, lung, or kidney conditions, diabetes, and cancer.  Ensure good ventilation Make sure that shared spaces in the home have  good air flow, such as from an air conditioner or an opened window, weather permitting.  Wash your hands often  Wash your hands often and thoroughly with soap and water for at least 20 seconds. You can use an alcohol based hand sanitizer if soap and water are not available and if your hands are not visibly dirty.  Avoid touching your eyes, nose, and mouth with unwashed hands.  Use disposable paper towels to dry your hands. If not available, use dedicated cloth towels and replace them when they become wet.  Wear a facemask and gloves  Wear a disposable facemask at all times in the room and gloves when you touch or have contact with the patient's blood, body fluids, and/or secretions or excretions, such as sweat, saliva, sputum, nasal mucus, vomit, urine, or feces.  Ensure the mask fits over your nose and mouth tightly, and do not touch it during use.  Throw out disposable facemasks and gloves after using them. Do not reuse.  Wash your hands immediately after removing your facemask and gloves.  If your personal clothing becomes contaminated, carefully remove clothing and launder. Wash your hands after handling contaminated clothing.  Place all used disposable facemasks, gloves, and other waste in a lined container before disposing them with other household waste.  Remove gloves and wash your hands immediately after handling these items.  Do not share dishes, glasses, or other household items with the patient  Avoid sharing household items. You should not share dishes, drinking glasses, cups, eating utensils, towels, bedding, or other items with a patient who is confirmed to have, or being evaluated for, COVID-19 infection.  After the person uses these items, you should wash them thoroughly with soap and water.  Wash laundry thoroughly  Immediately remove and wash clothes or bedding that have blood, body fluids, and/or secretions or excretions, such as sweat, saliva, sputum, nasal  mucus, vomit, urine, or feces, on them.  Wear gloves when handling laundry from the patient.  Read and follow directions on labels of laundry or clothing items and detergent. In general, wash and dry with the warmest temperatures recommended on the label.  Clean all areas the individual has used often  Clean all touchable surfaces, such as counters, tabletops, doorknobs, bathroom fixtures, toilets, phones, keyboards, tablets, and bedside tables, every day. Also, clean any surfaces that may have blood, body fluids, and/or secretions or excretions on them.  Wear gloves when cleaning surfaces the patient has come in contact with.  Use a diluted bleach solution (e.g., dilute bleach with 1 part bleach and 10 parts water) or a household disinfectant with a label that says EPA-registered for coronaviruses. To make a bleach solution at home, add 1 tablespoon of bleach to 1 quart (4 cups) of water. For a larger supply, add  cup of bleach to 1 gallon (16 cups) of water.  Read labels of cleaning products and follow recommendations  provided on product labels. Labels contain instructions for safe and effective use of the cleaning product including precautions you should take when applying the product, such as wearing gloves or eye protection and making sure you have good ventilation during use of the product.  Remove gloves and wash hands immediately after cleaning.  Monitor yourself for signs and symptoms of illness Caregivers and household members are considered close contacts, should monitor their health, and will be asked to limit movement outside of the home to the extent possible. Follow the monitoring steps for close contacts listed on the symptom monitoring form.   ? If you have additional questions, contact your local health department or call the epidemiologist on call at [phone] (available 24/7). ? This guidance is subject to change. For the most up-to-date guidance from CDC, please  refer to their website: YouBlogs.pl    Activity:  As tolerated  Discharge Instructions    Call MD for:  difficulty breathing, headache or visual disturbances   Complete by: As directed    Call MD for:  persistant nausea and vomiting   Complete by: As directed    Call MD for:  temperature >100.4   Complete by: As directed    Diet - low sodium heart healthy   Complete by: As directed    Discharge instructions   Complete by: As directed    Follow with Primary MD  [name], MD in 1-2 weeks  Please get a complete blood count and chemistry panel checked by your Primary MD at your next visit, and again as instructed by your Primary MD.  Get Medicines reviewed and adjusted: Please take all your medications with you for your next visit with your Primary MD  Laboratory/radiological data: Please request your Primary MD to go over all hospital tests and procedure/radiological results at the follow up, please ask your Primary MD to get all Hospital records sent to his/her office.  In some cases, they will be blood work, cultures and biopsy results pending at the time of your discharge. Please request that your primary care M.D. follows up on these results.  Also Note the following: If you experience worsening of your admission symptoms, develop shortness of breath, life threatening emergency, suicidal or homicidal thoughts you must seek medical attention immediately by calling 911 or calling your MD immediately  if symptoms less severe.  You must read complete instructions/literature along with all the possible adverse reactions/side effects for all the Medicines you take and that have been prescribed to you. Take any new Medicines after you have completely understood and accpet all the possible adverse reactions/side effects.   Do not drive when taking Pain medications or sleeping medications (Benzodaizepines)  Do not take  more than prescribed Pain, Sleep and Anxiety Medications. It is not advisable to combine anxiety,sleep and pain medications without talking with your primary care practitioner  Special Instructions: If you have smoked or chewed Tobacco  in the last 2 yrs please stop smoking, stop any regular Alcohol  and or any Recreational drug use.  Wear Seat belts while driving.  Please note: You were cared for by a hospitalist during your hospital stay. Once you are discharged, your primary care physician will handle any further medical issues. Please note that NO REFILLS for any discharge medications will be authorized once you are discharged, as it is imperative that you return to your primary care physician (or establish a relationship with a primary care physician if you do not have one) for  your post hospital discharge needs so that they can reassess your need for medications and monitor your lab values.   1.)  21 days of isolation from 01/28/2020   Increase activity slowly   Complete by: As directed      Allergies as of 02/01/2020      Reactions   Ivp Dye [iodinated Diagnostic Agents] Nausea And Vomiting   Oxycodone Itching   Penicillins Rash   Has patient had a PCN reaction causing immediate rash, facial/tongue/throat swelling, SOB or lightheadedness with hypotension: Yes Has patient had a PCN reaction causing severe rash involving mucus membranes or skin necrosis: No Has patient had a PCN reaction that required hospitalization No Has patient had a PCN reaction occurring within the last 10 years: No If all of the above answers are "NO", then may proceed with Cephalosporin use.      Medication List    STOP taking these medications   acetaminophen 325 MG tablet Commonly known as: Tylenol   docusate sodium 100 MG capsule Commonly known as: COLACE   methylPREDNISolone 4 MG Tbpk tablet Commonly known as: MEDROL DOSEPAK   multivitamin with minerals Tabs tablet   oxybutynin 5 MG  tablet Commonly known as: DITROPAN   traMADol 50 MG tablet Commonly known as: ULTRAM     TAKE these medications   allopurinol 100 MG tablet Commonly known as: ZYLOPRIM Take 1 tablet (100 mg total) by mouth daily.   clopidogrel 75 MG tablet Commonly known as: PLAVIX Take 1 tablet (75 mg total) by mouth daily.   diclofenac sodium 1 % Gel Commonly known as: VOLTAREN Apply 4 g topically 4 (four) times daily as needed for pain.   gabapentin 100 MG capsule Commonly known as: NEURONTIN TAKE 1 CAPSULE(100 MG) BY MOUTH AT BEDTIME What changed:   how much to take  how to take this  when to take this  additional instructions   HYDROcodone-acetaminophen 5-325 MG tablet Commonly known as: NORCO/VICODIN Take 1 tablet by mouth every 6 (six) hours as needed for moderate pain.   isosorbide mononitrate 30 MG 24 hr tablet Commonly known as: IMDUR Take 1 tablet (30 mg total) by mouth daily.   levothyroxine 75 MCG tablet Commonly known as: SYNTHROID Take 75 mcg by mouth daily before breakfast. What changed: Another medication with the same name was removed. Continue taking this medication, and follow the directions you see here.   metoprolol tartrate 25 MG tablet Commonly known as: LOPRESSOR Take 0.5 tablets (12.5 mg total) by mouth 2 (two) times daily.   nitroGLYCERIN 0.4 MG SL tablet Commonly known as: NITROSTAT Place 1 tablet (0.4 mg total) under the tongue every 5 (five) minutes as needed for chest pain.   pantoprazole 40 MG tablet Commonly known as: PROTONIX Take 1 tablet (40 mg total) by mouth 2 (two) times daily.   rosuvastatin 40 MG tablet Commonly known as: CRESTOR Take 1 tablet (40 mg total) by mouth daily.   tiZANidine 4 MG tablet Commonly known as: ZANAFLEX Take 4 mg by mouth 2 (two) times daily as needed for muscle spasms.       Follow-up Information    [facility] Follow up.   Specialty: Home Health Services Why: Home Health PT/OT Services  arranged. They will contact you about 48 hours after discharge to arrange first visit.  Contact information: [place] [address] [phone]        [name], MD. Schedule an appointment as soon as  possible for a visit in 1 week(s).   Specialty: Internal Medicine Contact information: [place] [address] [phone]              Allergies  Allergen Reactions  . Ivp Dye [Iodinated Diagnostic Agents] Nausea And Vomiting  . Oxycodone Itching  . Penicillins Rash    Has patient had a PCN reaction causing immediate rash, facial/tongue/throat swelling, SOB or lightheadedness with hypotension: Yes Has patient had a PCN reaction causing severe rash involving mucus membranes or skin necrosis: No Has patient had a PCN reaction that required hospitalization No Has patient had a PCN reaction occurring within the last 10 years: No If all of the above answers are "NO", then may proceed with Cephalosporin use.      Other Procedures/Studies: CT ABDOMEN PELVIS WO CONTRAST  Result Date: 01/28/2020 CLINICAL DATA:  Sepsis, weakness fatigue vomiting EXAM: CT ABDOMEN AND PELVIS WITHOUT CONTRAST TECHNIQUE: Multidetector CT imaging of the abdomen and pelvis was performed following the standard protocol without IV contrast. COMPARISON:  None. FINDINGS: Lower chest: The visualized heart size within normal limits. No pericardial fluid/thickening. There is a small hiatal hernia present. Ground-glass opacities are seen at both lung bases, predominantly within the periphery. Hepatobiliary: The liver is normal in density without focal abnormality.The main portal vein is patent. The patient is status post cholecystectomy. No biliary ductal dilation. Pancreas: Unremarkable. No pancreatic ductal dilatation or surrounding inflammatory changes. Spleen: Normal in size without focal abnormality. Adrenals/Urinary Tract: Both adrenal glands appear normal. The  kidneys and collecting system appear normal without evidence of urinary tract calculus or hydronephrosis. Bladder is unremarkable. Stomach/Bowel: The stomach, small bowel, and colon are normal in appearance. No inflammatory changes, wall thickening, or obstructive findings. The patient has had a prior right hemicolectomy and appendectomy. Vascular/Lymphatic: There are no enlarged mesenteric, retroperitoneal, or pelvic lymph nodes. Scattered aortic atherosclerotic calcifications are seen without aneurysmal dilatation. Reproductive: The patient is status post hysterectomy. No adnexal masses or collections seen. Other: There is a small abdominal wall hernia containing the tiny loop of bowel. No evidence of bowel strangulation is seen. Within the right lower quadrant there is a loculated area of fat with fat stranding changes measuring approximately 3.2 cm which could be a prior area of omental infarct/fat necrosis. Musculoskeletal: No acute or significant osseous findings. Degenerative changes seen in the thoracolumbar spine. IMPRESSION: Ground-glass opacities seen at both lung bases which may be due to inflammatory or early infectious etiology. No acute intra-abdominal or pelvic pathology to explain the patient's symptoms. Aortic Atherosclerosis (ICD10-I70.0). Electronically Signed   By: Prudencio Pair M.D.   On: 01/28/2020 02:20   DG Chest Port 1 View  Result Date: 01/27/2020 CLINICAL DATA:  Sepsis.  Nausea and fatigue. EXAM: PORTABLE CHEST 1 VIEW COMPARISON:  04/11/2018 FINDINGS: Lung volumes are low. Normal heart size with mild aortic tortuosity and atherosclerosis. Minimal patchy opacity at the left greater than right lung base. No pulmonary edema. No pleural fluid or pneumothorax. Degenerative change in the spine. No acute osseous abnormalities are seen. IMPRESSION: Low lung volumes with patchy left greater than right basilar opacity, favor atelectasis. Pneumonia could have a similar appearance in the  appropriate clinical setting. Electronically Signed   By: Keith Rake M.D.   On: 01/27/2020 23:24     TODAY-DAY OF DISCHARGE:  Subjective:   Cabrina Shiroma today has no headache,no chest abdominal pain,no new weakness tingling or numbness, feels much better wants to go home today.   Objective:  Blood pressure (!) 129/81, pulse 60, temperature 98.1 F (36.7 C), temperature source Axillary, resp. rate 19, weight 82.3 kg, SpO2 97 %.  Intake/Output Summary (Last 24 hours) at 02/01/2020 1058 Last data filed at 02/01/2020 0516 Gross per 24 hour  Intake --  Output 1000 ml  Net -1000 ml   Filed Weights   02/01/20 0514  Weight: 82.3 kg    Exam: Awake Alert, Oriented *3, No new F.N deficits, Normal affect [place].AT,PERRAL Supple Neck,No JVD, No cervical lymphadenopathy appriciated.  Symmetrical Chest wall movement, Good air movement bilaterally, CTAB RRR,No Gallops,Rubs or new Murmurs, No Parasternal Heave +ve B.Sounds, Abd Soft, Non tender, No organomegaly appriciated, No rebound -guarding or rigidity. No Cyanosis, Clubbing or edema, No new Rash or bruise   PERTINENT RADIOLOGIC STUDIES: CT ABDOMEN PELVIS WO CONTRAST  Result Date: 01/28/2020 CLINICAL DATA:  Sepsis, weakness fatigue vomiting EXAM: CT ABDOMEN AND PELVIS WITHOUT CONTRAST TECHNIQUE: Multidetector CT imaging of the abdomen and pelvis was performed following the standard protocol without IV contrast. COMPARISON:  None. FINDINGS: Lower chest: The visualized heart size within normal limits. No pericardial fluid/thickening. There is a small hiatal hernia present. Ground-glass opacities are seen at both lung bases, predominantly within the periphery. Hepatobiliary: The liver is normal in density without focal abnormality.The main portal vein is patent. The patient is status post cholecystectomy. No biliary ductal dilation. Pancreas: Unremarkable. No pancreatic ductal dilatation or surrounding inflammatory changes. Spleen: Normal in  size without focal abnormality. Adrenals/Urinary Tract: Both adrenal glands appear normal. The kidneys and collecting system appear normal without evidence of urinary tract calculus or hydronephrosis. Bladder is unremarkable. Stomach/Bowel: The stomach, small bowel, and colon are normal in appearance. No inflammatory changes, wall thickening, or obstructive findings. The patient has had a prior right hemicolectomy and appendectomy. Vascular/Lymphatic: There are no enlarged mesenteric, retroperitoneal, or pelvic lymph nodes. Scattered aortic atherosclerotic calcifications are seen without aneurysmal dilatation. Reproductive: The patient is status post hysterectomy. No adnexal masses or collections seen. Other: There is a small abdominal wall hernia containing the tiny loop of bowel. No evidence of bowel strangulation is seen. Within the right lower quadrant there is a loculated area of fat with fat stranding changes measuring approximately 3.2 cm which could be a prior area of omental infarct/fat necrosis. Musculoskeletal: No acute or significant osseous findings. Degenerative changes seen in the thoracolumbar spine. IMPRESSION: Ground-glass opacities seen at both lung bases which may be due to inflammatory or early infectious etiology. No acute intra-abdominal or pelvic pathology to explain the patient's symptoms. Aortic Atherosclerosis (ICD10-I70.0). Electronically Signed   By: Prudencio Pair M.D.   On: 01/28/2020 02:20   DG Chest Port 1 View  Result Date: 01/27/2020 CLINICAL DATA:  Sepsis.  Nausea and fatigue. EXAM: PORTABLE CHEST 1 VIEW COMPARISON:  04/11/2018 FINDINGS: Lung volumes are low. Normal heart size with mild aortic tortuosity and atherosclerosis. Minimal patchy opacity at the left greater than right lung base. No pulmonary edema. No pleural fluid or pneumothorax. Degenerative change in the spine. No acute osseous abnormalities are seen. IMPRESSION: Low lung volumes with patchy left greater than right  basilar opacity, favor atelectasis. Pneumonia could have a similar appearance in the appropriate clinical setting. Electronically Signed   By: Keith Rake M.D.   On: 01/27/2020 23:24     PERTINENT LAB RESULTS: CBC: Recent Labs    01/31/20 1050 02/01/20 0624  WBC 5.1 3.6*  HGB 10.6* 10.8*  HCT 32.3* 33.4*  PLT 317 323   CMET CMP  Component Value Date/Time   NA 141 02/01/2020 0624   K 3.9 02/01/2020 0624   CL 110 02/01/2020 0624   CO2 21 (L) 02/01/2020 0624   GLUCOSE 82 02/01/2020 0624   BUN 17 02/01/2020 0624   CREATININE 1.48 (H) 02/01/2020 0624   CREATININE 1.43 (H) 01/27/2020 1628   CALCIUM 8.4 (L) 02/01/2020 0624   PROT 5.8 (L) 02/01/2020 0624   PROT 6.5 06/18/2019 1246   ALBUMIN 2.7 (L) 02/01/2020 0624   ALBUMIN 4.1 06/18/2019 1246   AST 29 02/01/2020 0624   ALT 20 02/01/2020 0624   ALKPHOS 71 02/01/2020 0624   BILITOT 0.5 02/01/2020 0624   BILITOT 0.2 06/18/2019 1246   GFRNONAA 33 (L) 02/01/2020 0624   GFRAA 38 (L) 02/01/2020 0624    GFR Estimated Creatinine Clearance: 28.8 mL/min (A) (by C-G formula based on SCr of 1.48 mg/dL (H)). No results for input(s): LIPASE, AMYLASE in the last 72 hours. No results for input(s): CKTOTAL, CKMB, CKMBINDEX, TROPONINI in the last 72 hours. Invalid input(s): POCBNP Recent Labs    01/31/20 0634 02/01/20 0624  DDIMER 1.13* 1.06*   No results for input(s): HGBA1C in the last 72 hours. No results for input(s): CHOL, HDL, LDLCALC, TRIG, CHOLHDL, LDLDIRECT in the last 72 hours. No results for input(s): TSH, T4TOTAL, T3FREE, THYROIDAB in the last 72 hours.  Invalid input(s): FREET3 Recent Labs    01/31/20 0634 02/01/20 0624  FERRITIN 94 86   Coags: No results for input(s): INR in the last 72 hours.  Invalid input(s): PT Microbiology: Recent Results (from the past 240 hour(s))  Blood Culture (routine x 2)     Status: None (Preliminary result)   Collection Time: 01/27/20 11:57 PM   Specimen: BLOOD RIGHT  WRIST  Result Value Ref Range Status   Specimen Description BLOOD RIGHT WRIST  Final   Special Requests   Final    BOTTLES DRAWN AEROBIC AND ANAEROBIC Blood Culture adequate volume   Culture   Final    NO GROWTH 4 DAYS Performed at [facility], [address] [place], [name] [address]    Report Status PENDING  Incomplete  Urine Culture     Status: Abnormal   Collection Time: 01/28/20 12:24 AM   Specimen: Urine, Random  Result Value Ref Range Status   Specimen Description URINE, RANDOM  Final   Special Requests   Final    NONE Performed at [facility], [place] [address] [place], [place] [address]    Culture MULTIPLE SPECIES PRESENT, SUGGEST RECOLLECTION (A)  Final   Report Status 01/29/2020 FINAL  Final  SARS Coronavirus 2 by RT PCR (hospital order, performed in [facility] hospital lab) Nasopharyngeal Nasopharyngeal Swab     Status: Abnormal   Collection Time: 01/28/20 12:24 AM   Specimen: Nasopharyngeal Swab  Result Value Ref Range Status   SARS Coronavirus 2 POSITIVE (A) NEGATIVE Final    Comment: RESULT CALLED TO, READ BACK BY AND VERIFIED WITH: RN HARRIS C. AT [facility] 01/28/2020 (NOTE) SARS-CoV-2 target nucleic acids are DETECTED  SARS-CoV-2 RNA is generally detectable in upper respiratory specimens  during the acute phase of infection.  Positive results are indicative  of the presence of the identified virus, but do not rule out bacterial infection or co-infection with other pathogens not detected by the test.  Clinical correlation with patient history and  other diagnostic information is necessary to determine patient infection status.  The expected result is negative.  Fact Sheet for Patients:   StrictlyIdeas.no   Fact Sheet for Healthcare Providers:   BankingDealers.co.za    This test is not yet approved or cleared by the [place] FDA and  has been authorized for detection and/or  diagnosis of SARS-CoV-2 by FDA under an Emergency Use Authorization (EUA).  This EUA will remain in effect (me aning this test can be used) for the duration of  the COVID-19 declaration under Section 564(b)(1) of the Act, 21 U.S.C. section 360-bbb-3(b)(1), unless the authorization is terminated or revoked sooner.  Performed at [facility], [place] [address], [place], [place] [address]   Blood Culture (routine x 2)     Status: None (Preliminary result)   Collection Time: 01/28/20  1:33 AM   Specimen: BLOOD  Result Value Ref Range Status   Specimen Description BLOOD SITE NOT SPECIFIED  Final   Special Requests   Final    BOTTLES DRAWN AEROBIC AND ANAEROBIC Blood Culture adequate volume   Culture   Final    NO GROWTH 4 DAYS Performed at [facility], [address], [place], [place] [address]    Report Status PENDING  Incomplete    FURTHER DISCHARGE INSTRUCTIONS:  Get Medicines reviewed and adjusted: Please take all your medications with you for your next visit with your Primary MD  Laboratory/radiological data: Please request your Primary MD to go over all hospital tests and procedure/radiological results at the follow up, please ask your Primary MD to get all Hospital records sent to his/her office.  In some cases, they will be blood work, cultures and biopsy results pending at the time of your discharge. Please request that your primary care M.D. goes through all the records of your hospital data and follows up on these results.  Also Note the following: If you experience worsening of your admission symptoms, develop shortness of breath, life threatening emergency, suicidal or homicidal thoughts you must seek medical attention immediately by calling 911 or calling your MD immediately  if symptoms less severe.  You must read complete instructions/literature along with all the possible adverse reactions/side effects for all the Medicines you take and that have been  prescribed to you. Take any new Medicines after you have completely understood and accpet all the possible adverse reactions/side effects.   Do not drive when taking Pain medications or sleeping medications (Benzodaizepines)  Do not take more than prescribed Pain, Sleep and Anxiety Medications. It is not advisable to combine anxiety,sleep and pain medications without talking with your primary care practitioner  Special Instructions: If you have smoked or chewed Tobacco  in the last 2 yrs please stop smoking, stop any regular Alcohol  and or any Recreational drug use.  Wear Seat belts while driving.  Please note: You were cared for by a hospitalist during your hospital stay. Once you are discharged, your primary care physician will handle any further medical issues. Please note that NO REFILLS for any discharge medications will be authorized once you are discharged, as it is imperative that you return to your primary care physician (or establish a relationship with a primary care physician if you do not have one) for your post hospital discharge needs so that they can reassess your need for medications and monitor your lab values.  Total Time spent coordinating discharge including counseling, education and face to face time equals 35 minutes.  SignedOren Binet 02/01/2020 10:58 AM

## 2020-02-01 NOTE — Progress Notes (Signed)
Joan Elliott to be D/C'd home per MD order. Discussed with the patient and all questions fully answered. VVS, Skin clean, dry and intact without evidence of skin break down, no evidence of skin tears noted.  IV catheter discontinued intact. Site without signs and symptoms of complications. Dressing and pressure applied.  An After Visit Summary was printed and given to the patient.  Patient escorted via [facility] [place], and D/C home via private auto.  [name]  02/01/2020 3:12 PM

## 2020-02-02 LAB — CULTURE, BLOOD (ROUTINE X 2)
Culture: NO GROWTH
Culture: NO GROWTH
Special Requests: ADEQUATE
Special Requests: ADEQUATE

## 2020-02-03 ENCOUNTER — Telehealth: Payer: Self-pay | Admitting: Specialist

## 2020-02-03 NOTE — Telephone Encounter (Signed)
Patient called requesting a call back. Patient states to have questions about medications. Patient has pneumonia and Covid and need to know if its ok to take hydrocodone. Patient phone number is [phone].

## 2020-02-03 NOTE — Telephone Encounter (Signed)
Please advise 

## 2020-02-04 NOTE — Telephone Encounter (Signed)
If she is being treated by a primary care MD then she needs to discuss this with them. I do not think it is appropriate to take narcotics when Pulmonary function may be compromised. We did not know that this was the case when her meds were renewed. I do not know how  serious the pulmonary situation is but her treating physician does so she should talk to them. My inclination is do not take these medications If you have COPD or a compromised pulmonary status as they can decrease respiration and cause a worsening of her ability to breathe and Survive the effects of the pneumonia.  Hydrocodone is sometimes prescribed for the cough associated with an upper airway condition but this Should be decided by her primary care physician that is treating her for the COVID and pneumonia.

## 2020-02-04 NOTE — Telephone Encounter (Signed)
I called and advised patient of the message below from Dr. Louanne Skye, she states that she understands and she will call her PCP to discuss

## 2020-02-27 ENCOUNTER — Ambulatory Visit
Admission: RE | Admit: 2020-02-27 | Discharge: 2020-02-27 | Disposition: A | Discharge status: Home / Self Care | Payer: Medicare HMO | Source: Ambulatory Visit | Attending: Specialist | Admitting: Specialist

## 2020-02-27 ENCOUNTER — Other Ambulatory Visit: Payer: Self-pay

## 2020-02-27 DIAGNOSIS — M4315 Spondylolisthesis, thoracolumbar region: Secondary | ICD-10-CM

## 2020-04-03 ENCOUNTER — Ambulatory Visit: Payer: Medicare HMO | Admitting: Specialist

## 2020-04-06 DIAGNOSIS — Z7189 Other specified counseling: Secondary | ICD-10-CM | POA: Insufficient documentation

## 2020-04-06 DIAGNOSIS — N184 Chronic kidney disease, stage 4 (severe): Secondary | ICD-10-CM | POA: Insufficient documentation

## 2020-04-06 NOTE — Progress Notes (Deleted)
Cardiology Office Note   Date:  04/06/2020   ID:  Elliott, Joan [birth year], MRN [id number]  PCP:  [name], MD  Cardiologist:   No primary care provider on file.   No chief complaint on file.     History of Present Illness: Joan Elliott is a 79 y.o. female who presents for follow up of CAD s/p stent mRCA 2003.  She was seen back in 2016 by Dr. Gwenlyn Found for chest pain and ruled out with troponins. She underwent a lexiscan that was normal. Since that time was lost to follow up. She was in the hospital in Oct of last year with chest pain that was thought to be nonanginal.  [place]cho was unremarkable.     Since I last saw her she ***   ***  continue to get chest discomfort.  She has multiple aches and pains.  She is tender to touch.  Most of the exam on her chest and abdomen and legs.  She complains of chest discomfort under the left breast.  He describes some substernal discomfort.  Hard to sort out whether this was the same as when she had last year which was negative perfusion study.  Some of it is reproducible with palpation.  Otherwise it happens without movement or exertion.  She has chronic shortness of breath.  Not describing any new PND or orthopnea.  She is not describing any new palpitations, presyncope or syncope.   Past Medical History:  Diagnosis Date  . Aortic atherosclerosis ([place]) 02/01/2018  . Arthritis   . COPD (chronic obstructive pulmonary disease) ([place])   . Coronary artery disease    s/p stent mid RCA 2003  . GERD (gastroesophageal reflux disease)   . Gout   . HLD (hyperlipidemia)   . Hypertension   . Hypertensive heart disease without CHF   . Hypothyroidism   . Morbid obesity ([place]) 02/01/2018  . Peripheral vascular disease ([place]) 02/01/2018  . Stage 4 chronic kidney disease ([place]) 02/01/2018  . Stroke ([place])   . Tobacco abuse     Past Surgical History:  Procedure Laterality Date  . ABDOMINAL HYSTERECTOMY    . LAPAROSCOPY N/A 04/11/2018   Procedure:  LAPAROSCOPY DIAGNOSTIC;  Surgeon: Rolm Bookbinder, MD;  Location: [facility];  Service: General;  Laterality: N/A;  . LAPAROTOMY N/A 04/11/2018   Procedure: EXPLORATORY LAPAROTOMY;  Surgeon: Rolm Bookbinder, MD;  Location: [facility];  Service: General;  Laterality: N/A;  . LYSIS OF ADHESION N/A 04/11/2018   Procedure: LYSIS OF ADHESION;  Surgeon: Rolm Bookbinder, MD;  Location: [facility];  Service: General;  Laterality: N/A;  . PARTIAL COLECTOMY N/A 04/11/2018   Procedure: COLECTOMY;  Surgeon: Rolm Bookbinder, MD;  Location: [facility];  Service: General;  Laterality: N/A;  . REPLACEMENT TOTAL KNEE Right   . THYROID SURGERY       Current Outpatient Medications  Medication Sig Dispense Refill  . allopurinol (ZYLOPRIM) 100 MG tablet Take 1 tablet (100 mg total) by mouth daily. 30 tablet 1  . clopidogrel (PLAVIX) 75 MG tablet Take 1 tablet (75 mg total) by mouth daily. 30 tablet 12  . diclofenac sodium (VOLTAREN) 1 % GEL Apply 4 g topically 4 (four) times daily as needed for pain.  5  . gabapentin (NEURONTIN) 100 MG capsule TAKE 1 CAPSULE(100 MG) BY MOUTH AT BEDTIME (Patient taking differently: Take 100 mg by mouth at bedtime. ) 30 capsule 1  . HYDROcodone-acetaminophen (NORCO/VICODIN) 5-325 MG tablet Take 1 tablet by  mouth every 6 (six) hours as needed for moderate pain. 30 tablet 0  . isosorbide mononitrate (IMDUR) 30 MG 24 hr tablet Take 1 tablet (30 mg total) by mouth daily. 90 tablet 3  . levothyroxine (SYNTHROID) 75 MCG tablet Take 75 mcg by mouth daily before breakfast.    . metoprolol tartrate (LOPRESSOR) 25 MG tablet Take 0.5 tablets (12.5 mg total) by mouth 2 (two) times daily. 60 tablet 0  . nitroGLYCERIN (NITROSTAT) 0.4 MG SL tablet Place 1 tablet (0.4 mg total) under the tongue every 5 (five) minutes as needed for chest pain. 90 tablet 3  . pantoprazole (PROTONIX) 40 MG tablet Take 1 tablet (40 mg total) by mouth 2 (two) times daily. 60 tablet 3  . rosuvastatin (CRESTOR) 40 MG tablet Take 1  tablet (40 mg total) by mouth daily. 90 tablet 3  . tiZANidine (ZANAFLEX) 4 MG tablet Take 4 mg by mouth 2 (two) times daily as needed for muscle spasms.     No current facility-administered medications for this visit.    Allergies:   Ivp dye [iodinated diagnostic agents], Oxycodone, and Penicillins    ROS:  Please see the history of present illness.   Otherwise, review of systems are positive for follow up of *** .   All other systems are reviewed and negative.    PHYSICAL EXAM: VS:  There were no vitals taken for this visit. , BMI There is no height or weight on file to calculate BMI.  GENERAL:  Well appearing NECK:  No jugular venous distention, waveform within normal limits, carotid upstroke brisk and symmetric, no bruits, no thyromegaly LUNGS:  Clear to auscultation bilaterally CHEST:  Unremarkable HEART:  PMI not displaced or sustained,S1 and S2 within normal limits, no S3, no S4, no clicks, no rubs, *** murmurs ABD:  Flat, positive bowel sounds normal in frequency in pitch, no bruits, no rebound, no guarding, no midline pulsatile mass, no hepatomegaly, no splenomegaly EXT:  2 plus pulses throughout, no edema, no cyanosis no clubbing    ***GENERAL:  Well appearing NECK:  No jugular venous distention, waveform within normal limits, carotid upstroke brisk and symmetric, no bruits, no thyromegaly LUNGS:  Clear to auscultation bilaterally CHEST:  Unremarkable HEART:  PMI not displaced or sustained,S1 and S2 within normal limits, no S3, no S4, no clicks, no rubs, no murmurs ABD:  Flat, positive bowel sounds normal in frequency in pitch, no bruits, no rebound, no guarding, no midline pulsatile mass, no hepatomegaly, no splenomegaly EXT:  2 plus pulses throughout, no edema, no cyanosis no clubbing   EKG:  EKG is *** ordered today. Sinus rhythm, rate ***, left bundle branch block, left axis deviation, no significant change from previous.  Recent Labs: 02/01/2020: ALT 20; BUN 17;  Creatinine, Ser 1.48; Hemoglobin 10.8; Platelets 323; Potassium 3.9; Sodium 141    Lipid Panel    Component Value Date/Time   CHOL 136 06/18/2019 1246   TRIG 113 06/18/2019 1246   HDL 71 06/18/2019 1246   CHOLHDL 1.9 06/18/2019 1246   CHOLHDL 2.8 02/02/2018 0410   VLDL 13 02/02/2018 0410   LDLCALC 45 06/18/2019 1246      Wt Readings from Last 3 Encounters:  02/01/20 181 lb 7 oz (82.3 kg)  01/27/20 192 lb (87.1 kg)  04/08/19 192 lb (87.1 kg)      Other studies Reviewed: Additional studies/ records that were reviewed today include:  ***. Review of the above records demonstrates:  Please see elsewhere in the  note.     ASSESSMENT AND PLAN:  CAD:   ***  She has some atypical chest pain.  Is very difficult to sort out some of her symptoms.  I am to start Imdur 30 mg daily.  She will be given sublingual nitroglycerin.  We can bring her back and see if her symptoms have progressed but I am not wanting to do another imaging test at this point as her symptoms are atypical and not appreciably different than last year.   HTN:  The blood pressure is *** at target.  No change in therapy.   DYSLIPIDEMIA:  LDL was *** not at target.  I will stop Lipitor and start Crestor 40 mg daily with a lipid profile liver enzymes in about 10 weeks.  CKD IV:   ***  Her creat is actually improved as above.  No change in therapy.   TOBACCO ABUSE:  ***    She will stop smoking because it helps her stress.  We talked about this.  COVID EDUCATION:  ***    Current medicines are reviewed at length with the patient today.  The patient does not have concerns regarding medicines.  The following changes have been made:  As above  Labs/ tests ordered today include:   No orders of the defined types were placed in this encounter.    Disposition:   FU with APP in 4 months.   Signed, Minus Breeding, MD  04/06/2020 9:19 PM    [place]

## 2020-04-07 ENCOUNTER — Ambulatory Visit: Payer: Medicare HMO | Admitting: Cardiology

## 2020-04-10 ENCOUNTER — Other Ambulatory Visit: Payer: Self-pay | Admitting: Specialist

## 2020-04-29 NOTE — Progress Notes (Addendum)
Cardiology Office Note   Date:  04/30/2020   ID:  Madalina, Rosman Aug 09, 1940, MRN [id number]  PCP:  [name], MD  Cardiologist:   No primary care provider on file.   Chief Complaint  Patient presents with  . Fatigue      History of Present Illness: NINFA GIANNELLI is a 79 y.o. female who presents for follow up of CAD s/p stent mRCA 2003.  She was seen back in 2016 by Dr. Gwenlyn Found for chest pain and ruled out with troponins. She underwent a lexiscan that was normal. Since that time was lost to follow up. She was in the hospital in Oct of last year with chest pain that was thought to be nonanginal.  Echo was unremarkable.     Since I last saw her she has had increasing fatigue.  She has muscle cramps.  She is not complaining of any acute cardiovascular complaints.  She will occasionally get some chest discomfort that she describes as a burning discomfort up into her throat.  She occasionally has to take a nitroglycerin.  However, this has been a stable pattern.  She is not had any increase in his frequency.  Her symptoms go away quickly after nitroglycerin.  She has had no new shortness of breath, PND or orthopnea.  She has had no new palpitations, presyncope or syncope.  She gets around slowly because of muscle aches and joint pains.  Her biggest complaint is fatigue but it turns out she sleeps during the day and not at night.  It does not sound like she gets enough sleep.  She has some chronic anemia but this has been stable.   Past Medical History:  Diagnosis Date  . Aortic atherosclerosis ([place]) 02/01/2018  . Arthritis   . COPD (chronic obstructive pulmonary disease) ([place])   . Coronary artery disease    s/p stent mid RCA 2003  . GERD (gastroesophageal reflux disease)   . Gout   . HLD (hyperlipidemia)   . Hypertension   . Hypertensive heart disease without CHF   . Hypothyroidism   . Morbid obesity ([place]) 02/01/2018  . Peripheral vascular disease ([place]) 02/01/2018  . Stage 4  chronic kidney disease ([place]) 02/01/2018  . Stroke ([place])   . Tobacco abuse     Past Surgical History:  Procedure Laterality Date  . ABDOMINAL HYSTERECTOMY    . LAPAROSCOPY N/A 04/11/2018   Procedure: LAPAROSCOPY DIAGNOSTIC;  Surgeon: Rolm Bookbinder, MD;  Location: [facility];  Service: General;  Laterality: N/A;  . LAPAROTOMY N/A 04/11/2018   Procedure: EXPLORATORY LAPAROTOMY;  Surgeon: Rolm Bookbinder, MD;  Location: [facility];  Service: General;  Laterality: N/A;  . LYSIS OF ADHESION N/A 04/11/2018   Procedure: LYSIS OF ADHESION;  Surgeon: Rolm Bookbinder, MD;  Location: [facility];  Service: General;  Laterality: N/A;  . PARTIAL COLECTOMY N/A 04/11/2018   Procedure: COLECTOMY;  Surgeon: Rolm Bookbinder, MD;  Location: [facility];  Service: General;  Laterality: N/A;  . REPLACEMENT TOTAL KNEE Right   . THYROID SURGERY       Current Outpatient Medications  Medication Sig Dispense Refill  . allopurinol (ZYLOPRIM) 100 MG tablet Take 1 tablet (100 mg total) by mouth daily. 30 tablet 1  . clopidogrel (PLAVIX) 75 MG tablet Take 1 tablet (75 mg total) by mouth daily. 30 tablet 12  . diclofenac sodium (VOLTAREN) 1 % GEL Apply 4 g topically 4 (four) times daily as needed for pain.  5  . gabapentin (  NEURONTIN) 100 MG capsule TAKE 1 CAPSULE(100 MG) BY MOUTH AT BEDTIME (Patient taking differently: Take 100 mg by mouth at bedtime. ) 30 capsule 1  . HYDROcodone-acetaminophen (NORCO/VICODIN) 5-325 MG tablet Take 1 tablet by mouth every 6 (six) hours as needed for moderate pain. 30 tablet 0  . levothyroxine (SYNTHROID) 75 MCG tablet Take 75 mcg by mouth daily before breakfast.    . metoprolol tartrate (LOPRESSOR) 25 MG tablet Take 0.5 tablets (12.5 mg total) by mouth 2 (two) times daily. 60 tablet 0  . pantoprazole (PROTONIX) 40 MG tablet Take 1 tablet (40 mg total) by mouth 2 (two) times daily. 60 tablet 3  . tiZANidine (ZANAFLEX) 4 MG tablet Take 4 mg by mouth 2 (two) times daily as needed for muscle spasms.     . isosorbide mononitrate (IMDUR) 30 MG 24 hr tablet Take 1 tablet (30 mg total) by mouth daily. 90 tablet 3  . nitroGLYCERIN (NITROSTAT) 0.4 MG SL tablet Place 1 tablet (0.4 mg total) under the tongue every 5 (five) minutes as needed for chest pain. 90 tablet 3  . rosuvastatin (CRESTOR) 40 MG tablet Take 1 tablet (40 mg total) by mouth daily. 90 tablet 3   No current facility-administered medications for this visit.    Allergies:   Ivp dye [iodinated diagnostic agents], Oxycodone, and Penicillins    ROS:  Please see the history of present illness.   Otherwise, review of systems are positive for follow up of muscle spasms, hypersomnolence, fatigue.   All other systems are reviewed and negative.    PHYSICAL EXAM: VS:  BP (!) 148/72   Pulse 61   Ht 5' (1.524 m)   Wt 195 lb 9.6 oz (88.7 kg)   SpO2 96%   BMI 38.20 kg/m  , BMI Body mass index is 38.2 kg/m.  GENERAL:  Well appearing NECK:  No jugular venous distention, waveform within normal limits, carotid upstroke brisk and symmetric, no bruits, no thyromegaly LUNGS:  Clear to auscultation bilaterally CHEST:  Unremarkable HEART:  PMI not displaced or sustained,S1 and S2 within normal limits, no S3, no S4, no clicks, no rubs, no murmurs ABD:  Flat, positive bowel sounds normal in frequency in pitch, no bruits, no rebound, no guarding, no midline pulsatile mass, no hepatomegaly, no splenomegaly EXT:  2 plus pulses throughout, no edema, no cyanosis no clubbing   EKG:  EKG is  ordered today. Sinus rhythm, rate 71, axis within normal limits, intervals within normal poor anterior R wave progression, no acute ST-T wave changes.  Recent Labs: 02/01/2020: ALT 20; BUN 17; Creatinine, Ser 1.48; Hemoglobin 10.8; Platelets 323; Potassium 3.9; Sodium 141    Lipid Panel    Component Value Date/Time   CHOL 136 06/18/2019 1246   TRIG 113 06/18/2019 1246   HDL 71 06/18/2019 1246   CHOLHDL 1.9 06/18/2019 1246   CHOLHDL 2.8 02/02/2018 0410    VLDL 13 02/02/2018 0410   LDLCALC 45 06/18/2019 1246      Wt Readings from Last 3 Encounters:  04/30/20 195 lb 9.6 oz (88.7 kg)  02/01/20 181 lb 7 oz (82.3 kg)  01/27/20 192 lb (87.1 kg)      Other studies Reviewed: Additional studies/ records that were reviewed today include:  Labs. Review of the above records demonstrates:  Please see elsewhere in the note.     ASSESSMENT AND PLAN:  CAD:    The patient has no new sypmtoms.  No further cardiovascular testing is indicated.  We will  continue with aggressive risk reduction and meds as listed.  Seems like she has some stable angina.  No change in therapy.  HTN:  The blood pressure is very mildly elevated.  No change in therapy.  She can keep an eye on this with a home blood pressure diary.   DYSLIPIDEMIA:  LDL was 45 with an HDL of 71.  This was an excellent response to the change in therapy last year.  No change in therapy.   CKD IV:   Creatinine seems to be improved at 1.48.  No change in therapy.   TOBACCO ABUSE:   She needs to stop smoking and we had this conversation.  FATIGUE: She has very poor sleep patterns.  I will check a TSH but doubt that this is the problem.  We had a long conversation about sleep hygiene and she should talk with her primary provider about potentially needing a medication.  COVID EDUCATION: She had a mild case of Covid and was hospitalized.  I did review these notes for this visit.    Current medicines are reviewed at length with the patient today.  The patient does not have concerns regarding medicines.  The following changes have been made:  As above  Labs/ tests ordered today include:   Orders Placed This Encounter  Procedures  . TSH     Disposition:   FU with me in 12 months.   Signed, Minus Breeding, MD  04/30/2020 12:05 PM    [place]

## 2020-04-30 ENCOUNTER — Other Ambulatory Visit: Payer: Self-pay

## 2020-04-30 ENCOUNTER — Ambulatory Visit: Payer: Medicare HMO | Admitting: Cardiology

## 2020-04-30 ENCOUNTER — Encounter: Payer: Self-pay | Admitting: Cardiology

## 2020-04-30 VITALS — BP 148/72 | HR 61 | Ht 60.0 in | Wt 195.6 lb

## 2020-04-30 DIAGNOSIS — I10 Essential (primary) hypertension: Secondary | ICD-10-CM

## 2020-04-30 DIAGNOSIS — E038 Other specified hypothyroidism: Secondary | ICD-10-CM

## 2020-04-30 DIAGNOSIS — N184 Chronic kidney disease, stage 4 (severe): Secondary | ICD-10-CM | POA: Diagnosis not present

## 2020-04-30 DIAGNOSIS — I2510 Atherosclerotic heart disease of native coronary artery without angina pectoris: Secondary | ICD-10-CM | POA: Diagnosis not present

## 2020-04-30 DIAGNOSIS — E785 Hyperlipidemia, unspecified: Secondary | ICD-10-CM | POA: Diagnosis not present

## 2020-04-30 DIAGNOSIS — Z720 Tobacco use: Secondary | ICD-10-CM

## 2020-04-30 NOTE — Patient Instructions (Signed)
Medication Instructions:  No changes *If you need a refill on your cardiac medications before your next appointment, please call your pharmacy*   Lab Work: TSH in our lab today If you have labs (blood work) drawn today and your tests are completely normal, you will receive your results only by: Marland Kitchen MyChart Message (if you have MyChart) OR . A paper copy in the mail If you have any lab test that is abnormal or we need to change your treatment, we will call you to review the results.   Testing/Procedures: None ordered   Follow-Up: At [facility], you and your health needs are our priority.  As part of our continuing mission to provide you with exceptional heart care, we have created designated Provider Care Teams.  These Care Teams include your primary Cardiologist (physician) and Advanced Practice Providers (APPs -  Physician Assistants and Nurse Practitioners) who all work together to provide you with the care you need, when you need it.  We recommend signing up for the patient portal called "MyChart".  Sign up information is provided on this After Visit Summary.  MyChart is used to connect with patients for Virtual Visits (Telemedicine).  Patients are able to view lab/test results, encounter notes, upcoming appointments, etc.  Non-urgent messages can be sent to your provider as well.   To learn more about what you can do with MyChart, go to [email or web address].    Your next appointment:   12 month(s)  The format for your next appointment:   In Person  Provider:   Minus Breeding, MD   Other Instructions None

## 2020-05-01 LAB — TSH: TSH: 2.69 u[IU]/mL (ref 0.450–4.500)

## 2020-05-07 ENCOUNTER — Ambulatory Visit: Payer: Medicare HMO | Admitting: Specialist

## 2020-05-08 ENCOUNTER — Other Ambulatory Visit: Payer: Self-pay | Admitting: Cardiology

## 2020-05-08 DIAGNOSIS — I2510 Atherosclerotic heart disease of native coronary artery without angina pectoris: Secondary | ICD-10-CM

## 2020-05-08 DIAGNOSIS — I10 Essential (primary) hypertension: Secondary | ICD-10-CM

## 2020-05-18 ENCOUNTER — Other Ambulatory Visit: Payer: Self-pay | Admitting: Specialist

## 2020-06-08 ENCOUNTER — Encounter: Payer: Self-pay | Admitting: Specialist

## 2020-06-08 ENCOUNTER — Ambulatory Visit (INDEPENDENT_AMBULATORY_CARE_PROVIDER_SITE_OTHER): Payer: Medicare HMO | Admitting: Specialist

## 2020-06-08 ENCOUNTER — Other Ambulatory Visit: Payer: Self-pay

## 2020-06-08 VITALS — BP 161/89 | HR 105 | Ht 60.0 in | Wt 195.6 lb

## 2020-06-08 DIAGNOSIS — M4315 Spondylolisthesis, thoracolumbar region: Secondary | ICD-10-CM

## 2020-06-08 DIAGNOSIS — M48062 Spinal stenosis, lumbar region with neurogenic claudication: Secondary | ICD-10-CM | POA: Diagnosis not present

## 2020-06-08 DIAGNOSIS — M5416 Radiculopathy, lumbar region: Secondary | ICD-10-CM

## 2020-06-08 MED ORDER — HYDROCODONE-ACETAMINOPHEN 5-325 MG PO TABS: 1.0000 | ORAL_TABLET | Freq: Four times a day (QID) | ORAL | 0 refills | Status: AC | PRN

## 2020-06-08 NOTE — Progress Notes (Signed)
Office Visit Note   Patient: Joan Elliott           Date of Birth: [birth year]           MRN: [id number] Visit Date: 06/08/2020              Requested by: [name], MD [address] [place],  [place] [address] PCP: [name], MD   Assessment & Plan: Visit Diagnoses:  1. Spondylolisthesis of thoracolumbar region   2. Spinal stenosis of lumbar region with neurogenic claudication   3. Lumbar radiculopathy     Plan: Avoid bending, stooping and avoid lifting weights greater than 10 lbs. Avoid prolong standing and walking. Order for a new walker with wheels. Surgery scheduling secretary Kandice Hams, will call you in the next week to schedule for surgery.  Surgery recommended is a two level lumbar decompression L3-4 and L4-5 with fusion L4-5 for spondylolisthesis this would be done with rods, screws and cages with local bone graft and allograft (donor bone graft). Take hydrocodone for for pain. Risk of surgery includes risk of infection 1 in 200 patients, bleeding 1/2% chance you would need a transfusion.   Risk to the nerves is one in 10,000. You will need to use a brace for 3 months and wean from the brace on the 4th month. Expect improved walking and standing tolerance. Expect relief of leg pain but numbness may persist depending on the length and degree of pressure that has been present.    Follow-Up Instructions: No follow-ups on file.   Orders:  No orders of the defined types were placed in this encounter.  No orders of the defined types were placed in this encounter.     Procedures: No procedures performed   Clinical Data: Findings:  Narrative & Impression CLINICAL DATA:  79 year old female with low back pain radiating to the right leg. Numbness in the right leg. No known injury.  EXAM: MRI LUMBAR SPINE WITHOUT CONTRAST  TECHNIQUE: Multiplanar, multisequence MR imaging of the lumbar spine was performed. No intravenous contrast was  administered.  COMPARISON:  Lumbar radiographs 04/24/2017 and lumbar MRI 09/29/2017.  FINDINGS: Segmentation: Normal, the same numbering system used on the 2019 MRI.  Alignment: Stable lumbar lordosis since 2019 with grade 1 anterolisthesis of L4 on L5 measuring 5 mm.  Vertebrae: No marrow edema or evidence of acute osseous abnormality. Visualized bone marrow signal is within normal limits. Intact visible sacrum and SI joints.  Conus medullaris and cauda equina: Conus extends to the L2 level. No lower spinal cord or conus signal abnormality.  Paraspinal and other soft tissues: Negative.  Disc levels:  Mild for age disc degeneration in the lower thoracic spine, no visible lower thoracic spinal stenosis.  T12-L1: Mild circumferential disc bulge and posterior element hypertrophy. But only mild right T12 foraminal stenosis.  L1-L2: Mild circumferential disc bulge and posterior element hypertrophy. Mild L1 foraminal stenosis.  L2-L3: Circumferential disc bulge with small bilateral subarticular disc protrusions. Mild to moderate posterior element hypertrophy. Mild spinal stenosis. Mild left L2 foraminal stenosis. No definite lateral recess stenosis. This level is stable.  L3-L4: Circumferential but mostly far lateral disc bulging. Moderate facet and ligament flavum hypertrophy. Mild to moderate spinal and bilateral L3 foraminal stenosis. This level is stable.  L4-L5: Anterolisthesis with circumferential disc bulge/pseudo disc eccentric to the left. Moderate to severe facet and ligament flavum hypertrophy. Severe spinal and bilateral lateral recess stenosis (L5 nerve levels). Mild to moderate left and mild right L4  foraminal stenosis. This level is stable.  L5-S1: Circumferential disc bulge and endplate spurring. Mild facet hypertrophy. No spinal or lateral recess stenosis. Mild to moderate left and mild right L5 foraminal stenosis. This level  stable.  IMPRESSION: 1. Stable MRI appearance of the lumbar spine since 2019. 2. Grade 1 anterolisthesis at L4-L5 with disc and advanced posterior element degeneration combining for severe spinal and bilateral lateral recess stenosis, with up to moderate left foraminal stenosis.  3. Multifactorial mild to moderate spinal stenosis at L3-L4 and mild spinal stenosis at L2-L3. Moderate left L5 neural foraminal stenosis.   Electronically Signed   By: Genevie Ann M.D.   On: 02/27/2020 09:35       Subjective: Chief Complaint  Patient presents with  . Lower Back - Follow-up    MRI Review    79 year old female with a history of back and leg pain with prolong standing and walking. She has pain with walking any distance. No bowel or bladder difficulty though she has noted increased Frequency of urination. She has urinary urgency and does wear a pad all the time. MRI with severe spinal stenosis L4-5   Review of Systems  Constitutional: Negative.   HENT: Negative.   Eyes: Negative.   Respiratory: Negative.   Cardiovascular: Negative.   Gastrointestinal: Negative.   Endocrine: Negative.   Genitourinary: Negative.   Musculoskeletal: Negative.   Skin: Negative.   Allergic/Immunologic: Negative.   Neurological: Negative.   Hematological: Negative.   Psychiatric/Behavioral: Negative.      Objective: Vital Signs: BP (!) 161/89 (BP Location: Left Arm, Patient Position: Sitting)   Pulse (!) 105   Ht 5' (1.524 m)   Wt 195 lb 9.6 oz (88.7 kg)   BMI 38.20 kg/m   Physical Exam Constitutional:      Appearance: She is well-developed.  HENT:     Head: Normocephalic and atraumatic.  Eyes:     Pupils: Pupils are equal, round, and reactive to light.  Pulmonary:     Effort: Pulmonary effort is normal.     Breath sounds: Normal breath sounds.  Abdominal:     General: Bowel sounds are normal.     Palpations: Abdomen is soft.  Musculoskeletal:     Cervical back: Normal range  of motion and neck supple.  Skin:    General: Skin is warm and dry.  Neurological:     Mental Status: She is alert and oriented to person, place, and time.  Psychiatric:        Behavior: Behavior normal.        Thought Content: Thought content normal.        Judgment: Judgment normal.     Back Exam   Tenderness  The patient is experiencing tenderness in the lumbar.  Range of Motion  Extension: abnormal  Flexion: normal  Lateral bend right: abnormal  Lateral bend left: abnormal  Rotation right: abnormal   Muscle Strength  Right Quadriceps:  5/5  Left Quadriceps:  5/5  Right Hamstrings:  5/5       Specialty Comments:  No specialty comments available.  Imaging: No results found.   PMFS History: Patient Active Problem List   Diagnosis Date Noted  . Educated about COVID-19 virus infection 04/06/2020  . CKD (chronic kidney disease), stage IV ([place]) 04/06/2020  . CAP (community acquired pneumonia) 01/28/2020  . Pneumonia due to COVID-19 virus 01/28/2020  . Dyslipidemia 04/07/2019  . Ileus following gastrointestinal surgery ([place])   . AKI (acute kidney  injury) ([place]) 04/05/2018  . Acute perforated appendicitis 04/05/2018  . HTN (hypertension) 04/05/2018  . Aortic atherosclerosis ([place]) 02/01/2018  . Morbid obesity ([place]) 02/01/2018  . CKD (chronic kidney disease) stage 3, GFR 30-59 ml/min 02/01/2018  . Peripheral vascular disease ([place]) 02/01/2018  . Nonspecific chest pain 02/23/2015  . Hypertensive heart disease without CHF   . Coronary artery disease   . Tobacco abuse   . Hypothyroidism   . HLD (hyperlipidemia)   . Gout   . GERD (gastroesophageal reflux disease)    Past Medical History:  Diagnosis Date  . Aortic atherosclerosis ([place]) 02/01/2018  . Arthritis   . COPD (chronic obstructive pulmonary disease) ([place])   . Coronary artery disease    s/p stent mid RCA 2003  . GERD (gastroesophageal reflux disease)   . Gout   . HLD (hyperlipidemia)   . Hypertension    . Hypertensive heart disease without CHF   . Hypothyroidism   . Morbid obesity ([place]) 02/01/2018  . Peripheral vascular disease ([place]) 02/01/2018  . Stage 4 chronic kidney disease ([place]) 02/01/2018  . Stroke ([place])   . Tobacco abuse     Family History  Problem Relation Age of Onset  . Hypertension Mother   . Kidney disease Mother   . Hypertension Brother   . Hypertension Sister     Past Surgical History:  Procedure Laterality Date  . ABDOMINAL HYSTERECTOMY    . LAPAROSCOPY N/A 04/11/2018   Procedure: LAPAROSCOPY DIAGNOSTIC;  Surgeon: Rolm Bookbinder, MD;  Location: [facility];  Service: General;  Laterality: N/A;  . LAPAROTOMY N/A 04/11/2018   Procedure: EXPLORATORY LAPAROTOMY;  Surgeon: Rolm Bookbinder, MD;  Location: [facility];  Service: General;  Laterality: N/A;  . LYSIS OF ADHESION N/A 04/11/2018   Procedure: LYSIS OF ADHESION;  Surgeon: Rolm Bookbinder, MD;  Location: [facility];  Service: General;  Laterality: N/A;  . PARTIAL COLECTOMY N/A 04/11/2018   Procedure: COLECTOMY;  Surgeon: Rolm Bookbinder, MD;  Location: [facility];  Service: General;  Laterality: N/A;  . REPLACEMENT TOTAL KNEE Right   . THYROID SURGERY     Social History   Occupational History  . Not on file  Tobacco Use  . Smoking status: Current Some Day Smoker    Packs/day: 0.50    Types: Cigarettes  . Smokeless tobacco: Never Used  Vaping Use  . Vaping Use: Never used  Substance and Sexual Activity  . Alcohol use: No  . Drug use: No  . Sexual activity: Not on file

## 2020-06-08 NOTE — Patient Instructions (Signed)
Plan: Avoid bending, stooping and avoid lifting weights greater than 10 lbs. Avoid prolong standing and walking. Order for a new walker with wheels. Surgery scheduling secretary Kandice Hams, will call you in the next week to schedule for surgery.  Surgery recommended is a two level lumbar decompression L3-4 and L4-5 with fusion L4-5 for spondylolisthesis this would be done with rods, screws and cages with local bone graft and allograft (donor bone graft). Take hydrocodone for for pain. Risk of surgery includes risk of infection 1 in 200 patients, bleeding 1/2% chance you would need a transfusion.   Risk to the nerves is one in 10,000. You will need to use a brace for 3 months and wean from the brace on the 4th month. Expect improved walking and standing tolerance. Expect relief of leg pain but numbness may persist depending on the length and degree of pressure that has been present.

## 2020-07-10 ENCOUNTER — Ambulatory Visit: Payer: Medicare HMO | Admitting: Specialist

## 2020-07-10 ENCOUNTER — Other Ambulatory Visit: Payer: Self-pay

## 2020-07-10 ENCOUNTER — Encounter: Payer: Self-pay | Admitting: Specialist

## 2020-07-10 VITALS — BP 150/86 | HR 71 | Ht 60.0 in | Wt 195.0 lb

## 2020-07-10 DIAGNOSIS — N289 Disorder of kidney and ureter, unspecified: Secondary | ICD-10-CM

## 2020-07-10 DIAGNOSIS — R350 Frequency of micturition: Secondary | ICD-10-CM | POA: Diagnosis not present

## 2020-07-10 DIAGNOSIS — M4315 Spondylolisthesis, thoracolumbar region: Secondary | ICD-10-CM

## 2020-07-10 DIAGNOSIS — M48062 Spinal stenosis, lumbar region with neurogenic claudication: Secondary | ICD-10-CM | POA: Diagnosis not present

## 2020-07-10 DIAGNOSIS — M1009 Idiopathic gout, multiple sites: Secondary | ICD-10-CM

## 2020-07-10 DIAGNOSIS — M25551 Pain in right hip: Secondary | ICD-10-CM

## 2020-07-10 MED ORDER — DULOXETINE HCL 20 MG PO CPEP: 20.0000 mg | ORAL_CAPSULE | Freq: Every day | ORAL | 3 refills | Status: AC

## 2020-07-10 NOTE — Addendum Note (Signed)
Addended by: Minda Ditto, Alyse Low N on: 07/10/2020 10:51 AM   Modules accepted: Orders

## 2020-07-10 NOTE — Patient Instructions (Addendum)
Avoid bending, stooping and avoid lifting weights greater than 10 lbs. Avoid prolong standing and walking. Avoid frequent bending and stooping  No lifting greater than 10 lbs. May use ice or moist heat for pain. Weight loss is of benefit. Handicap license is approved. Start Cymbalta 20 mg per day for arthritis and depression Check Lab Sed rate, ANA, RF, Uric  Acid, Vitamin D. PT referral for exercises lumbar and thoracic spine.

## 2020-07-10 NOTE — Progress Notes (Signed)
Office Visit Note   Patient: Joan Elliott           Date of Birth: May 03, 1941           MRN: [id number] Visit Date: 07/10/2020              Requested by: [name], MD [address] [place],  [place] [address] PCP: [name], MD   Assessment & Plan: Visit Diagnoses:  1. Spondylolisthesis of thoracolumbar region   2. Spinal stenosis of lumbar region with neurogenic claudication   3. Acute idiopathic gout of multiple sites   4. Increased urinary frequency   5. Pain in right hip     Plan: Avoid bending, stooping and avoid lifting weights greater than 10 lbs. Avoid prolong standing and walking. Avoid frequent bending and stooping  No lifting greater than 10 lbs. May use ice or moist heat for pain. Weight loss is of benefit. Handicap license is approved. Start Cymbalta 20 mg per day for arthritis and depression Check Lab Sed rate, ANA, RF, Uric  Acid, Vitamin D. PT referral for exercises lumbar and thoracic spine  Follow-Up Instructions: No follow-ups on file.   Orders:  No orders of the defined types were placed in this encounter.  No orders of the defined types were placed in this encounter.     Procedures: No procedures performed   Clinical Data: No additional findings.   Subjective: Chief Complaint  Patient presents with  . Lower Back - Follow-up    She can't tell how it is doing she hurts all over, hurts all the time, and the weather is not helping her.    80 year old female with a several year history of increasing back and leg pain worse with standing and walking. No bowel or bladder difficulty other than increase frequency. She had worsening about 2-3 year ago. Radiographs show DDD with a Grade 1 spondylolisthesis L4-5 and severe spinal stenosis L4-5. She stoops with walking uses a cane. Difficulty grocery shopping and has to lean on the grocery cart. Unable to walk a distance more than 100 feet due to pain. This improves with sitting and  bending and stooping. She has had ESI and the first did not help, the second did decrease the pain.   Review of Systems  Constitutional: Negative.   HENT: Negative.   Eyes: Negative.   Respiratory: Negative.   Cardiovascular: Negative.   Gastrointestinal: Negative.   Endocrine: Negative.   Genitourinary: Negative.   Musculoskeletal: Negative.   Skin: Negative.   Allergic/Immunologic: Negative.   Neurological: Negative.   Hematological: Negative.   Psychiatric/Behavioral: Negative.      Objective: Vital Signs: BP (!) 150/86 (BP Location: Left Arm, Patient Position: Sitting)   Pulse 71   Ht 5' (1.524 m)   Wt 195 lb (88.5 kg)   BMI 38.08 kg/m   Physical Exam Constitutional:      Appearance: She is well-developed and well-nourished.  HENT:     Head: Normocephalic and atraumatic.  Eyes:     Extraocular Movements: EOM normal.     Pupils: Pupils are equal, round, and reactive to light.  Pulmonary:     Effort: Pulmonary effort is normal.     Breath sounds: Normal breath sounds.  Abdominal:     General: Bowel sounds are normal.     Palpations: Abdomen is soft.  Musculoskeletal:     Cervical back: Normal range of motion and neck supple.     Lumbar back: Negative  right straight leg raise test and negative left straight leg raise test.  Skin:    General: Skin is warm and dry.  Neurological:     Mental Status: She is alert and oriented to person, place, and time.  Psychiatric:        Mood and Affect: Mood and affect normal.        Behavior: Behavior normal.        Thought Content: Thought content normal.        Judgment: Judgment normal.     Back Exam   Range of Motion  Extension: abnormal  Flexion: abnormal  Lateral bend right: abnormal  Lateral bend left: abnormal  Rotation right: abnormal  Rotation left: abnormal   Muscle Strength  Right Quadriceps:  5/5  Left Quadriceps:  5/5  Right Hamstrings:  5/5  Left Hamstrings:  5/5   Tests  Straight leg raise  right: negative Straight leg raise left: negative  Reflexes  Patellar: 1/4 Achilles: 1/4 Babinski's sign: normal   Other  Toe walk: normal Heel walk: normal Sensation: normal Gait: normal  Erythema: no back redness Scars: absent      Specialty Comments:  No specialty comments available.  Imaging: No results found.   PMFS History: Patient Active Problem List   Diagnosis Date Noted  . Educated about COVID-19 virus infection 04/06/2020  . CKD (chronic kidney disease), stage IV ([place]) 04/06/2020  . CAP (community acquired pneumonia) 01/28/2020  . Pneumonia due to COVID-19 virus 01/28/2020  . Dyslipidemia 04/07/2019  . Ileus following gastrointestinal surgery ([place])   . AKI (acute kidney injury) ([place]) 04/05/2018  . Acute perforated appendicitis 04/05/2018  . HTN (hypertension) 04/05/2018  . Aortic atherosclerosis ([place]) 02/01/2018  . Morbid obesity ([place]) 02/01/2018  . CKD (chronic kidney disease) stage 3, GFR 30-59 ml/min 02/01/2018  . Peripheral vascular disease ([place]) 02/01/2018  . Nonspecific chest pain 02/23/2015  . Hypertensive heart disease without CHF   . Coronary artery disease   . Tobacco abuse   . Hypothyroidism   . HLD (hyperlipidemia)   . Gout   . GERD (gastroesophageal reflux disease)    Past Medical History:  Diagnosis Date  . Aortic atherosclerosis ([place]) 02/01/2018  . Arthritis   . COPD (chronic obstructive pulmonary disease) ([place])   . Coronary artery disease    s/p stent mid RCA 2003  . GERD (gastroesophageal reflux disease)   . Gout   . HLD (hyperlipidemia)   . Hypertension   . Hypertensive heart disease without CHF   . Hypothyroidism   . Morbid obesity ([place]) 02/01/2018  . Peripheral vascular disease ([place]) 02/01/2018  . Stage 4 chronic kidney disease ([place]) 02/01/2018  . Stroke ([place])   . Tobacco abuse     Family History  Problem Relation Age of Onset  . Hypertension Mother   . Kidney disease Mother   . Hypertension Brother   . Hypertension  Sister     Past Surgical History:  Procedure Laterality Date  . ABDOMINAL HYSTERECTOMY    . LAPAROSCOPY N/A 04/11/2018   Procedure: LAPAROSCOPY DIAGNOSTIC;  Surgeon: Rolm Bookbinder, MD;  Location: [place] [facility];  Service: General;  Laterality: N/A;  . LAPAROTOMY N/A 04/11/2018   Procedure: EXPLORATORY LAPAROTOMY;  Surgeon: Rolm Bookbinder, MD;  Location: [facility];  Service: General;  Laterality: N/A;  . LYSIS OF ADHESION N/A 04/11/2018   Procedure: LYSIS OF ADHESION;  Surgeon: Rolm Bookbinder, MD;  Location: [facility];  Service: General;  Laterality: N/A;  . PARTIAL COLECTOMY N/A  04/11/2018   Procedure: COLECTOMY;  Surgeon: Rolm Bookbinder, MD;  Location: [facility];  Service: General;  Laterality: N/A;  . REPLACEMENT TOTAL KNEE Right   . THYROID SURGERY     Social History   Occupational History  . Not on file  Tobacco Use  . Smoking status: Current Some Day Smoker    Packs/day: 0.50    Types: Cigarettes  . Smokeless tobacco: Never Used  Vaping Use  . Vaping Use: Never used  Substance and Sexual Activity  . Alcohol use: No  . Drug use: No  . Sexual activity: Not on file

## 2020-07-11 LAB — VITAMIN D 25 HYDROXY (VIT D DEFICIENCY, FRACTURES): Vit D, 25-Hydroxy: 32 ng/mL (ref 30–100)

## 2020-07-11 LAB — SEDIMENTATION RATE: Sed Rate: 22 mm/h (ref 0–30)

## 2020-07-11 LAB — URIC ACID: Uric Acid, Serum: 5.4 mg/dL (ref 2.5–7.0)

## 2020-07-11 LAB — RHEUMATOID FACTOR: Rheumatoid fact SerPl-aCnc: <14 IU/mL (ref <14)

## 2020-07-15 LAB — ANA: Anti Nuclear Antibody (ANA): NEGATIVE

## 2020-07-17 ENCOUNTER — Ambulatory Visit (INDEPENDENT_AMBULATORY_CARE_PROVIDER_SITE_OTHER): Payer: Medicare HMO | Admitting: Rehabilitative and Restorative Service Providers"

## 2020-07-17 ENCOUNTER — Other Ambulatory Visit: Payer: Self-pay

## 2020-07-17 ENCOUNTER — Encounter: Payer: Self-pay | Admitting: Rehabilitative and Restorative Service Providers"

## 2020-07-17 DIAGNOSIS — M5416 Radiculopathy, lumbar region: Secondary | ICD-10-CM

## 2020-07-17 DIAGNOSIS — R262 Difficulty in walking, not elsewhere classified: Secondary | ICD-10-CM | POA: Diagnosis not present

## 2020-07-17 DIAGNOSIS — R293 Abnormal posture: Secondary | ICD-10-CM | POA: Diagnosis not present

## 2020-07-17 DIAGNOSIS — M6281 Muscle weakness (generalized): Secondary | ICD-10-CM

## 2020-07-17 NOTE — Patient Instructions (Signed)
Access Code: [email or web address] URL: [email or web address] Date: 07/17/2020 Prepared by: Vista Mink  Exercises Standing Lumbar Extension at [place] 5 x daily - 7 x weekly - 1 sets - 5 reps - 3 seconds hold Standing Scapular Retraction - 5 x daily - 7 x weekly - 1 sets - 5 reps - 5 second hold Heel Toe Raises with Counter Support - 3 x daily - 7 x weekly - 1 sets - 10 reps - 3 seconds hold

## 2020-07-17 NOTE — Therapy (Signed)
[facility] [address] [place], [place], [address] Phone: [phone]   Fax:  [phone]  Physical Therapy Treatment  Patient Details  Name: Joan Elliott MRN: [id number] Date of Birth: 08-31-1940 Referring Provider (PT): [name] MD  Referring diagnosis? M43.15  M48.062 Treatment diagnosis? (if different than referring diagnosis) R29.3  R26.2  M54.16  M62.81 What was this (referring dx) caused by? []  Surgery []  Fall [x]  Ongoing issue [x]  Arthritis []  Other: ____________  Laterality: []  Rt []  Lt [x]  Both  Check all possible CPT codes:      [x]  97110 (Therapeutic Exercise)  []  92507 (SLP Treatment)  [x]  97112 (Neuro Re-ed)   []  92526 (Swallowing Treatment)   [x]  97116 (Gait Training)   []  [id number] (Cognitive Training, 1st 15 minutes) [x]  97140 (Manual Therapy)   []  97130 (Cognitive Training, each add'l 15 minutes)  [x]  97530 (Therapeutic Activities)  []  Other, List CPT Code ____________    [x]  77939 (Self Care)       [x]  All codes above (97110 - 97535)  []  97012 (Mechanical Traction)  []  97014 (E-stim Unattended)  []  97032 (E-stim manual)  []  97033 (Ionto)  []  97035 (Ultrasound)  []  97760 (Orthotic Fit) [x]  97750 (Physical Performance Training) []  [id number] (Aquatic Therapy) []  97034 (Contrast Bath) []  [id number] (Paraffin) []  97597 (Wound Care 1st 20 sq cm) []  97598 (Wound Care each add'l 20 sq cm) []  97016 (Vasopneumatic Device) []  [phone] Comptroller) []  [phone] (Prosthetic Training)  Encounter Date: 07/17/2020   PT End of Session - 07/17/20 1746    Visit Number 1    Number of Visits 16    Date for PT Re-Evaluation 10/02/20    PT Start Time 1100    PT Stop Time 1139    PT Time Calculation (min) 39 min    Activity Tolerance Patient limited by fatigue    Behavior During Therapy [facility] for tasks assessed/performed           Past Medical History:  Diagnosis Date  . Aortic atherosclerosis ([place]) 02/01/2018  . Arthritis    . COPD (chronic obstructive pulmonary disease) ([place])   . Coronary artery disease    s/p stent mid RCA 2003  . GERD (gastroesophageal reflux disease)   . Gout   . HLD (hyperlipidemia)   . Hypertension   . Hypertensive heart disease without CHF   . Hypothyroidism   . Morbid obesity ([place]) 02/01/2018  . Peripheral vascular disease ([place]) 02/01/2018  . Stage 4 chronic kidney disease ([place]) 02/01/2018  . Stroke ([place])   . Tobacco abuse     Past Surgical History:  Procedure Laterality Date  . ABDOMINAL HYSTERECTOMY    . LAPAROSCOPY N/A 04/11/2018   Procedure: LAPAROSCOPY DIAGNOSTIC;  Surgeon: Rolm Bookbinder, MD;  Location: [place];  Service: General;  Laterality: N/A;  . LAPAROTOMY N/A 04/11/2018   Procedure: EXPLORATORY LAPAROTOMY;  Surgeon: Rolm Bookbinder, MD;  Location: [place];  Service: General;  Laterality: N/A;  . LYSIS OF ADHESION N/A 04/11/2018   Procedure: LYSIS OF ADHESION;  Surgeon: Rolm Bookbinder, MD;  Location: [place];  Service: General;  Laterality: N/A;  . PARTIAL COLECTOMY N/A 04/11/2018   Procedure: COLECTOMY;  Surgeon: Rolm Bookbinder, MD;  Location: [place];  Service: General;  Laterality: N/A;  . REPLACEMENT TOTAL KNEE Right   . THYROID SURGERY      There were no vitals filed for this visit.   Subjective Assessment - 07/17/20 1103    Subjective Joan Elliott notes  increased back pain particularly with sit to stand transitions along with standing and walking.  She uses a quad cane with gait (cane in R hand).  She reports some R leg paresthesias (numbness) when sitting and when WB.    Limitations Sitting;Writing;Reading;House hold activities;Lifting;Standing;Walking    How long can you sit comfortably? 30+ minutes    How long can you stand comfortably? < 5 minutes    How long can you walk comfortably? Inside the house mostly, avoids going out    Patient Stated Goals Reduce back pain, get stronger, walk better    Currently in Pain? Yes    Pain Score 5     Pain Location Back     Pain Orientation Lower    Pain Descriptors / Indicators Aching;Sore;Tightness;Numbness    Pain Type Chronic pain    Pain Radiating Towards R leg to foot    Pain Onset More than a month ago    Pain Frequency Intermittent    Aggravating Factors  Too much sitting, standing and walking    Pain Relieving Factors Change of position helps some    Effect of Pain on Daily Activities Limits activity outside the home, can't stand > 5 minutes without support, avoids going out of the house    Multiple Pain Sites No              [facility] PT Assessment - 07/17/20 0001      Assessment   Medical Diagnosis Spondylolisthesis M43.15, Spinal stenosis M84.132    Referring Provider (PT) [name] MD    Onset Date/Surgical Date --   "At least" a year duration     Precautions   Precautions --   Avoid hyperextension, Gentle extension for postural correction only     Balance Screen   Has the patient fallen in the past 6 months Yes    How many times? 1    Has the patient had a decrease in activity level because of a fear of falling?  Yes    Is the patient reluctant to leave their home because of a fear of falling?  Yes      Prior Function   Level of Independence Needs assistance with ADLs      Cognition   Overall Cognitive Status Within Functional Limits for tasks assessed      Observation/Other Assessments   Focus on Therapeutic Outcomes (FOTO)  34 (Goal 44)      ROM / Strength   AROM / PROM / Strength AROM;Strength      AROM   Overall AROM  Deficits    AROM Assessment Site Lumbar    Lumbar Extension 0      Strength   Overall Strength Deficits    Strength Assessment Site Lumbar    Lumbar Extension --   Unable to stand for more than a minute without support and walks with a cane or shopping cart in a flexed posture.                        [facility] PT Treatment/Exercise - 07/17/20 0001      Posture/Postural Control   Posture/Postural Control Postural limitations     Postural Limitations Forward head;Rounded Shoulders;Decreased lumbar lordosis      Therapeutic Activites    Therapeutic Activities ADL's    ADL's Review anatomy, posture, body mechanics, HEP, made recommendations for walker vs quad cane due to flexion and rotation noted with quad cane.  Reviewed and  practiced HEP.      Exercises   Exercises Lumbar      Lumbar Exercises: Standing   Scapular Retraction Strengthening;Both;10 reps    Scapular Retraction Limitations 5 seconds 2 sets shoulder blade pinches    Other Standing Lumbar Exercises Gentle standing trunk extension (hips forward) 2 sets of 10 3 seconds limited range (2-3 inches)    Other Standing Lumbar Exercises Heel to toe raises 2 sets of 10 3 seconds                  PT Education - 07/17/20 1745    Education Details Reviewed exam findings and HEP    Person(s) Educated Patient    Methods Explanation;Demonstration;Tactile cues;Verbal cues;Handout    Comprehension Verbal cues required;Returned demonstration;Need further instruction;Tactile cues required;Verbalized understanding               PT Long Term Goals - 07/17/20 1753      PT LONG TERM GOAL #1   Title Improve FOTO score to 44.    Baseline 34    Time 8    Period Weeks    Status New    Target Date 10/02/20      PT LONG TERM GOAL #2   Title Improve trunk extension AROM to 10 degrees or within a comfortable range > 0 degrees.    Baseline 0 degrees    Time 8    Period Weeks    Status New    Target Date 10/02/20      PT LONG TERM GOAL #3   Title Joan Elliott will be able to walk with a walker for 8-10 minutes without complaints of increased low back pain.    Baseline Very limited walking with pain and a cane.    Time 8    Period Weeks    Status New    Target Date 10/02/20      PT LONG TERM GOAL #4   Title Joan Elliott will report low back pain consistently 0-4/10 on the Numeric Pain Rating Scale with no R leg pain or peripheral symptoms.    Baseline 5-7/10  with R sciatica    Time 8    Period Weeks    Status New    Target Date 10/02/20      PT LONG TERM GOAL #5   Title Joan Elliott will be independent with her HEP at DC.    Time 8    Period Weeks    Status New    Target Date 10/02/20                 Plan - 07/17/20 1747    Clinical Impression Statement Joan Elliott has chronic low back pain and several co-morbidities.  She has started to get some R leg peripheral symptoms.  She is limiting activities outside the home due to to low back and R leg pain.  Posture is flexed and she sits most of the day.  With general strengthening, gentle trunk extension to correct her flexed posture while avoiding hyperextension, core strengthening, switching to a walker from her cane and core strengthening, Joan Elliott's prognosis to meet long-term goals is good.    Personal Factors and Comorbidities Comorbidity 1    Comorbidities Previous R TKA, kidney disease, morbid obesity, PVD, COPD, CAD, HTN, hypothyroid, previous CVA    Examination-Activity Limitations Bathing;Dressing;Sit;Transfers;Bed Mobility;Bend;Lift;Squat;Locomotion Level;Stairs;Carry;Stand    Examination-Participation Restrictions Community Activity;Meal Prep    Stability/Clinical Decision Making Stable/Uncomplicated    Clinical Decision Making Low    Rehab  Potential Good    PT Frequency 2x / week    PT Duration 8 weeks    PT Treatment/Interventions ADLs/Self Care Home Management;Moist Heat;Cryotherapy;Therapeutic activities;Gait training;Therapeutic exercise;Neuromuscular re-education;Patient/family education;Manual techniques;Dry needling    PT Next Visit Plan General core and leg strengthening, work on posture and body mechanics    PT Home Exercise Plan Access Code: [email or web address]           Patient will benefit from skilled therapeutic intervention in order to improve the following deficits and impairments:  Abnormal gait,Cardiopulmonary status limiting activity,Decreased activity tolerance,Decreased  endurance,Decreased range of motion,Decreased mobility,Decreased strength,Difficulty walking,Hypomobility,Impaired flexibility,Increased muscle spasms,Postural dysfunction,Improper body mechanics,Obesity,Pain  Visit Diagnosis: Abnormal posture  Difficulty walking  Radiculopathy, lumbar region  Muscle weakness (generalized)     Problem List Patient Active Problem List   Diagnosis Date Noted  . Educated about COVID-19 virus infection 04/06/2020  . CKD (chronic kidney disease), stage IV ([place]) 04/06/2020  . CAP (community acquired pneumonia) 01/28/2020  . Pneumonia due to COVID-19 virus 01/28/2020  . Dyslipidemia 04/07/2019  . Ileus following gastrointestinal surgery ([place])   . AKI (acute kidney injury) ([place]) 04/05/2018  . Acute perforated appendicitis 04/05/2018  . HTN (hypertension) 04/05/2018  . Aortic atherosclerosis ([place]) 02/01/2018  . Morbid obesity ([place]) 02/01/2018  . CKD (chronic kidney disease) stage 3, GFR 30-59 ml/min 02/01/2018  . Peripheral vascular disease ([place]) 02/01/2018  . Nonspecific chest pain 02/23/2015  . Hypertensive heart disease without CHF   . Coronary artery disease   . Tobacco abuse   . Hypothyroidism   . HLD (hyperlipidemia)   . Gout   . GERD (gastroesophageal reflux disease)     Farley Ly PT, MPT 07/17/2020, 5:59 PM  [facility] Physical Therapy [address] [place], [place], [address] Phone: [phone]   Fax:  [phone]  Name: Joan Elliott MRN: [id number] Date of Birth: February 06, 1941

## 2020-07-22 ENCOUNTER — Other Ambulatory Visit: Payer: Self-pay | Admitting: Cardiology

## 2020-07-29 ENCOUNTER — Encounter: Payer: Self-pay | Admitting: Physical Therapy

## 2020-07-29 ENCOUNTER — Other Ambulatory Visit: Payer: Self-pay

## 2020-07-29 ENCOUNTER — Ambulatory Visit: Payer: Medicare HMO | Admitting: Physical Therapy

## 2020-07-29 DIAGNOSIS — M6281 Muscle weakness (generalized): Secondary | ICD-10-CM

## 2020-07-29 DIAGNOSIS — R262 Difficulty in walking, not elsewhere classified: Secondary | ICD-10-CM | POA: Diagnosis not present

## 2020-07-29 DIAGNOSIS — R293 Abnormal posture: Secondary | ICD-10-CM | POA: Diagnosis not present

## 2020-07-29 DIAGNOSIS — M5416 Radiculopathy, lumbar region: Secondary | ICD-10-CM | POA: Diagnosis not present

## 2020-07-29 NOTE — Therapy (Signed)
[facility] [address] [place], [place], [address] Phone: [phone]   Fax:  [phone]  Physical Therapy Treatment  Patient Details  Name: Joan Elliott MRN: [id number] Date of Birth: 16-Sep-1940 Referring Provider (PT): [name] MD   Encounter Date: 07/29/2020   PT End of Session - 07/29/20 1118    Visit Number 2    Number of Visits 16    Date for PT Re-Evaluation 10/02/20    PT Start Time 1100    PT Stop Time 1140    PT Time Calculation (min) 40 min    Activity Tolerance Patient limited by fatigue    Behavior During Therapy [facility] for tasks assessed/performed           Past Medical History:  Diagnosis Date  . Aortic atherosclerosis ([place]) 02/01/2018  . Arthritis   . COPD (chronic obstructive pulmonary disease) ([place])   . Coronary artery disease    s/p stent mid RCA 2003  . GERD (gastroesophageal reflux disease)   . Gout   . HLD (hyperlipidemia)   . Hypertension   . Hypertensive heart disease without CHF   . Hypothyroidism   . Morbid obesity ([place]) 02/01/2018  . Peripheral vascular disease ([place]) 02/01/2018  . Stage 4 chronic kidney disease ([place]) 02/01/2018  . Stroke ([place])   . Tobacco abuse     Past Surgical History:  Procedure Laterality Date  . ABDOMINAL HYSTERECTOMY    . LAPAROSCOPY N/A 04/11/2018   Procedure: LAPAROSCOPY DIAGNOSTIC;  Surgeon: Rolm Bookbinder, MD;  Location: [place];  Service: General;  Laterality: N/A;  . LAPAROTOMY N/A 04/11/2018   Procedure: EXPLORATORY LAPAROTOMY;  Surgeon: Rolm Bookbinder, MD;  Location: [place];  Service: General;  Laterality: N/A;  . LYSIS OF ADHESION N/A 04/11/2018   Procedure: LYSIS OF ADHESION;  Surgeon: Rolm Bookbinder, MD;  Location: [place];  Service: General;  Laterality: N/A;  . PARTIAL COLECTOMY N/A 04/11/2018   Procedure: COLECTOMY;  Surgeon: Rolm Bookbinder, MD;  Location: [place];  Service: General;  Laterality: N/A;  . REPLACEMENT TOTAL KNEE Right   . THYROID SURGERY       There were no vitals filed for this visit.   Subjective Assessment - 07/29/20 1103    Subjective Pt arriving today reporting R sided upper trap pain of 10/10. Pt amb today with a rollator walker with no pain reported in her low back.    Limitations Sitting;Writing;Reading;House hold activities;Lifting;Standing;Walking    How long can you sit comfortably? 30+ minutes    How long can you stand comfortably? < 5 minutes    How long can you walk comfortably? Inside the house mostly, avoids going out    Patient Stated Goals Reduce back pain, get stronger, walk better    Currently in Pain? Yes    Pain Score 10-Worst pain ever    Pain Location Neck    Pain Orientation Right    Pain Descriptors / Indicators Aching    Pain Type Acute pain    Pain Onset Today    Pain Frequency Constant                             [facility] Adult PT Treatment/Exercise - 07/29/20 0001      Exercises   Exercises Lumbar;Other Exercises      Lumbar Exercises: Stretches   Lower Trunk Rotation 2 reps;30 seconds    Piriformis Stretch 2 reps;20 seconds    Figure  4 Stretch 3 reps;20 seconds      Lumbar Exercises: Standing   Scapular Retraction Strengthening;Both;10 reps    Row Strengthening;AROM;Both;15 reps;Theraband    Theraband Level (Row) Level 3 (Green)    Other Standing Lumbar Exercises Pt reported this exercise was to difficult      Lumbar Exercises: Seated   Long Arc Quad on Chair Strengthening;Both;2 sets;10 reps    Sit to Stand 10 reps    Sit to Stand Limitations UE support    Other Seated Lumbar Exercises ball squeezes x 15 reps, clams with green threaband x 15 reps      Lumbar Exercises: Supine   Bridge --   pt with hamstring cramping with bridging.   Bridge Limitations PPT x 10,      Manual Therapy   Manual therapy comments IASTM and percussion to R upper trap and levator x 10 minutes                       PT Long Term Goals - 07/29/20 1145      PT LONG  TERM GOAL #1   Title Improve FOTO score to 44.    Status On-going      PT LONG TERM GOAL #2   Title Improve trunk extension AROM to 10 degrees or within a comfortable range > 0 degrees.    Status On-going      PT LONG TERM GOAL #3   Title Joan Elliott will be able to walk with a walker for 8-10 minutes without complaints of increased low back pain.    Status On-going      PT LONG TERM GOAL #4   Title Joan Elliott will report low back pain consistently 0-4/10 on the Numeric Pain Rating Scale with no R leg pain or peripheral symptoms.    Status On-going      PT LONG TERM GOAL #5   Title Joan Elliott will be independent with her HEP at DC.    Status On-going                 Plan - 07/29/20 1120    Clinical Impression Statement Pt arriving today in almost tears due to R upper trap pain of 10/10. After IASTM and mild percussion pt reporting pain decreased to 6-7/10 pain and pt able to lift her R UE. Pt with noted hamstring cramping. Pt was educated on importance of staying hydrated with water and eating potassium rich foods was recomended.  Pt tolerating seated exercises on R LE for strengthening along with posture exercises. Continue skilled PT.    Personal Factors and Comorbidities Comorbidity 1    Comorbidities Previous R TKA, kidney disease, morbid obesity, PVD, COPD, CAD, HTN, hypothyroid, previous CVA    Examination-Activity Limitations Bathing;Dressing;Sit;Transfers;Bed Mobility;Bend;Lift;Squat;Locomotion Level;Stairs;Carry;Stand    Examination-Participation Restrictions Community Activity;Meal Prep    Stability/Clinical Decision Making Stable/Uncomplicated    Rehab Potential Good    PT Frequency 2x / week    PT Duration 8 weeks    PT Treatment/Interventions ADLs/Self Care Home Management;Moist Heat;Cryotherapy;Therapeutic activities;Gait training;Therapeutic exercise;Neuromuscular re-education;Patient/family education;Manual techniques;Dry needling    PT Next Visit Plan General core and leg  strengthening, work on posture and body mechanics    PT Home Exercise Plan Access Code: [email or web address]    Consulted and Agree with Plan of Care Patient           Patient will benefit from skilled therapeutic intervention in order to improve the following deficits and impairments:  Abnormal gait,Cardiopulmonary status  limiting activity,Decreased activity tolerance,Decreased endurance,Decreased range of motion,Decreased mobility,Decreased strength,Difficulty walking,Hypomobility,Impaired flexibility,Increased muscle spasms,Postural dysfunction,Improper body mechanics,Obesity,Pain  Visit Diagnosis: Abnormal posture  Difficulty walking  Radiculopathy, lumbar region  Muscle weakness (generalized)     Problem List Patient Active Problem List   Diagnosis Date Noted  . Educated about COVID-19 virus infection 04/06/2020  . CKD (chronic kidney disease), stage IV ([place]) 04/06/2020  . CAP (community acquired pneumonia) 01/28/2020  . Pneumonia due to COVID-19 virus 01/28/2020  . Dyslipidemia 04/07/2019  . Ileus following gastrointestinal surgery ([place])   . AKI (acute kidney injury) ([place]) 04/05/2018  . Acute perforated appendicitis 04/05/2018  . HTN (hypertension) 04/05/2018  . Aortic atherosclerosis ([place]) 02/01/2018  . Morbid obesity ([place]) 02/01/2018  . CKD (chronic kidney disease) stage 3, GFR 30-59 ml/min 02/01/2018  . Peripheral vascular disease ([place]) 02/01/2018  . Nonspecific chest pain 02/23/2015  . Hypertensive heart disease without CHF   . Coronary artery disease   . Tobacco abuse   . Hypothyroidism   . HLD (hyperlipidemia)   . Gout   . GERD (gastroesophageal reflux disease)     Oretha Caprice, PT, MPT 07/29/2020, 11:51 AM  [facility] Physical Therapy [address] [place], [place], [address] Phone: [phone]   Fax:  [phone]  Name: Joan Elliott MRN: [id number] Date of Birth: May 16, 1941

## 2020-07-30 ENCOUNTER — Encounter: Payer: Medicare HMO | Admitting: Rehabilitative and Restorative Service Providers"

## 2020-08-04 ENCOUNTER — Ambulatory Visit: Payer: Medicare HMO | Admitting: Physical Therapy

## 2020-08-04 DIAGNOSIS — R262 Difficulty in walking, not elsewhere classified: Secondary | ICD-10-CM

## 2020-08-04 DIAGNOSIS — M5416 Radiculopathy, lumbar region: Secondary | ICD-10-CM | POA: Diagnosis not present

## 2020-08-04 DIAGNOSIS — M6281 Muscle weakness (generalized): Secondary | ICD-10-CM | POA: Diagnosis not present

## 2020-08-04 DIAGNOSIS — R293 Abnormal posture: Secondary | ICD-10-CM

## 2020-08-04 NOTE — Patient Instructions (Signed)
Access Code: [email or web address] URL: [email or web address] Date: 08/04/2020 Prepared by: Kearney Hard  Exercises Standing Scapular Retraction - 5 x daily - 7 x weekly - 1 sets - 5 reps - 5 second hold Heel Toe Raises with Counter Support - 3 x daily - 7 x weekly - 1 sets - 10 reps - 3 seconds hold Modified Thomas Stretch - 2-3 x daily - 7 x weekly - 2-3 reps - 60 seconds hold Standing Shoulder Row with Anchored Resistance - 2-3 x daily - 7 x weekly - 10 reps

## 2020-08-04 NOTE — Therapy (Signed)
[facility] Physical Therapy [address] [place], [place], [address] Phone: [phone]   Fax:  [phone]  Physical Therapy Treatment  Patient Details  Name: Joan Elliott MRN: [id number] Date of Birth: Feb 05, 1941 Referring Provider (PT): Joan Oto MD   Encounter Date: 08/04/2020   PT End of Session - 08/04/20 1125    Visit Number 3    Number of Visits 16    Date for PT Re-Evaluation 10/02/20    PT Start Time 1100    PT Stop Time 1140    PT Time Calculation (min) 40 min    Activity Tolerance Patient limited by fatigue    Behavior During Therapy [facility] for tasks assessed/performed           Past Medical History:  Diagnosis Date  . Aortic atherosclerosis ([place]) 02/01/2018  . Arthritis   . COPD (chronic obstructive pulmonary disease) ([place])   . Coronary artery disease    s/p stent mid RCA 2003  . GERD (gastroesophageal reflux disease)   . Gout   . HLD (hyperlipidemia)   . Hypertension   . Hypertensive heart disease without CHF   . Hypothyroidism   . Morbid obesity ([place]) 02/01/2018  . Peripheral vascular disease ([place]) 02/01/2018  . Stage 4 chronic kidney disease ([place]) 02/01/2018  . Stroke ([place])   . Tobacco abuse     Past Surgical History:  Procedure Laterality Date  . ABDOMINAL HYSTERECTOMY    . LAPAROSCOPY N/A 04/11/2018   Procedure: LAPAROSCOPY DIAGNOSTIC;  Surgeon: Rolm Bookbinder, MD;  Location: [place];  Service: General;  Laterality: N/A;  . LAPAROTOMY N/A 04/11/2018   Procedure: EXPLORATORY LAPAROTOMY;  Surgeon: Rolm Bookbinder, MD;  Location: [place];  Service: General;  Laterality: N/A;  . LYSIS OF ADHESION N/A 04/11/2018   Procedure: LYSIS OF ADHESION;  Surgeon: Rolm Bookbinder, MD;  Location: [place];  Service: General;  Laterality: N/A;  . PARTIAL COLECTOMY N/A 04/11/2018   Procedure: COLECTOMY;  Surgeon: Rolm Bookbinder, MD;  Location: [place];  Service: General;  Laterality: N/A;  . REPLACEMENT TOTAL KNEE Right   . THYROID SURGERY       There were no vitals filed for this visit.       [facility] PT Assessment - 08/04/20 0001      Assessment   Medical Diagnosis Spondylolisthesis M43.15, Spinal stenosis [id number]    Referring Provider (PT) Joan Oto MD      Precautions   Precautions --   avoid hyperextension                        [facility] Adult PT Treatment/Exercise - 08/04/20 0001      Lumbar Exercises: Stretches   Lower Trunk Rotation 2 reps;30 seconds    Hip Flexor Stretch 2 reps;60 seconds    Pelvic Tilt 10 reps;5 seconds    Piriformis Stretch 2 reps;20 seconds    Figure 4 Stretch 3 reps;20 seconds      Lumbar Exercises: Standing   Scapular Retraction Strengthening;Both;10 reps    Row Strengthening;AROM;Both;15 reps;Theraband    Theraband Level (Row) Level 3 (Green)      Lumbar Exercises: Seated   Long Arc Quad on Chair Strengthening;Both;2 sets;10 reps    Sit to Stand 10 reps    Sit to Stand Limitations UE support    Other Seated Lumbar Exercises with posture correction and core activation, opposite arm to knee x 10 each LE      Manual Therapy  Manual therapy comments Percussion to R upper trap x 5 minutes                       PT Long Term Goals - 08/04/20 1118      PT LONG TERM GOAL #1   Title Improve FOTO score to 44.    Status On-going      PT LONG TERM GOAL #2   Title Improve trunk extension AROM to 10 degrees or within a comfortable range > 0 degrees.    Status On-going      PT LONG TERM GOAL #3   Title [name] will be able to walk with a walker for 8-10 minutes without complaints of increased low back pain.    Baseline Very limited walking with pain and a cane.    Status On-going      PT LONG TERM GOAL #4   Title [name] will report low back pain consistently 0-4/10 on the Numeric Pain Rating Scale with no R leg pain or peripheral symptoms.    Status On-going      PT LONG TERM GOAL #5   Title [name] will be independent with her HEP at DC.    Status  On-going                 Plan - 08/04/20 1105    Clinical Impression Statement Pt tolreating treatment well today, still reporting pain in R upper trap, percussion performed along with upper trap stretch. Lumbar and LE strengthening performed in supine and sitting. Posture correction exercises and core actiation performed in sitting. Pt encouraged in eccentSkilled PT needed to progress pt toward her LTG's.    Personal Factors and Comorbidities Comorbidity 1    Comorbidities Previous R TKA, kidney disease, morbid obesity, PVD, COPD, CAD, HTN, hypothyroid, previous CVA    Examination-Activity Limitations Bathing;Dressing;Sit;Transfers;Bed Mobility;Bend;Lift;Squat;Locomotion Level;Stairs;Carry;Stand    Examination-Participation Restrictions Community Activity;Meal Prep    Stability/Clinical Decision Making Stable/Uncomplicated    Rehab Potential Good    PT Frequency 2x / week    PT Duration 8 weeks    PT Treatment/Interventions ADLs/Self Care Home Management;Moist Heat;Cryotherapy;Therapeutic activities;Gait training;Therapeutic exercise;Neuromuscular re-education;Patient/family education;Manual techniques;Dry needling    PT Next Visit Plan General core and leg strengthening, work on posture and body mechanics    PT Home Exercise Plan Access Code: [email or web address]    Consulted and Agree with Plan of Care Patient           Patient will benefit from skilled therapeutic intervention in order to improve the following deficits and impairments:  Abnormal gait,Cardiopulmonary status limiting activity,Decreased activity tolerance,Decreased endurance,Decreased range of motion,Decreased mobility,Decreased strength,Difficulty walking,Hypomobility,Impaired flexibility,Increased muscle spasms,Postural dysfunction,Improper body mechanics,Obesity,Pain  Visit Diagnosis: Abnormal posture  Difficulty walking  Radiculopathy, lumbar region  Muscle weakness (generalized)     Problem List Patient Active  Problem List   Diagnosis Date Noted  . Educated about COVID-19 virus infection 04/06/2020  . CKD (chronic kidney disease), stage IV ([place]) 04/06/2020  . CAP (community acquired pneumonia) 01/28/2020  . Pneumonia due to COVID-19 virus 01/28/2020  . Dyslipidemia 04/07/2019  . Ileus following gastrointestinal surgery ([place])   . AKI (acute kidney injury) ([place]) 04/05/2018  . Acute perforated appendicitis 04/05/2018  . HTN (hypertension) 04/05/2018  . Aortic atherosclerosis ([place]) 02/01/2018  . Morbid obesity ([place]) 02/01/2018  . CKD (chronic kidney disease) stage 3, GFR 30-59 ml/min 02/01/2018  . Peripheral vascular disease ([place]) 02/01/2018  . Nonspecific chest pain 02/23/2015  . Hypertensive heart disease  without CHF   . Coronary artery disease   . Tobacco abuse   . Hypothyroidism   . HLD (hyperlipidemia)   . Gout   . GERD (gastroesophageal reflux disease)     Oretha Caprice, PT, MPT  08/04/2020, 11:47 AM  [facility] Physical Therapy [address] [place], [place], [address] Phone: [phone]   Fax:  [phone]  Name: Joan Elliott MRN: [id number] Date of Birth: 1941/06/19

## 2020-08-06 ENCOUNTER — Encounter: Payer: Self-pay | Admitting: Rehabilitative and Restorative Service Providers"

## 2020-08-06 ENCOUNTER — Ambulatory Visit: Payer: Medicare HMO | Admitting: Rehabilitative and Restorative Service Providers"

## 2020-08-06 ENCOUNTER — Other Ambulatory Visit: Payer: Self-pay

## 2020-08-06 DIAGNOSIS — M6281 Muscle weakness (generalized): Secondary | ICD-10-CM

## 2020-08-06 DIAGNOSIS — M5416 Radiculopathy, lumbar region: Secondary | ICD-10-CM | POA: Diagnosis not present

## 2020-08-06 DIAGNOSIS — R293 Abnormal posture: Secondary | ICD-10-CM | POA: Diagnosis not present

## 2020-08-06 DIAGNOSIS — R262 Difficulty in walking, not elsewhere classified: Secondary | ICD-10-CM | POA: Diagnosis not present

## 2020-08-06 NOTE — Therapy (Signed)
[facility] Physical Therapy [address] [place], [place], [address] Phone: [phone]   Fax:  [phone]  Physical Therapy Treatment  Patient Details  Name: Joan Elliott MRN: [id number] Date of Birth: 08-May-1941 Referring Provider (PT): [name] MD   Encounter Date: 08/06/2020   PT End of Session - 08/06/20 1130    Visit Number 4    Number of Visits 16    Date for PT Re-Evaluation 10/02/20    PT Start Time [id number]    PT Stop Time 1116    PT Time Calculation (min) 46 min    Activity Tolerance Patient limited by fatigue;Patient tolerated treatment well;No increased pain    Behavior During Therapy [facility] for tasks assessed/performed           Past Medical History:  Diagnosis Date  . Aortic atherosclerosis ([place]) 02/01/2018  . Arthritis   . COPD (chronic obstructive pulmonary disease) ([place])   . Coronary artery disease    s/p stent mid RCA 2003  . GERD (gastroesophageal reflux disease)   . Gout   . HLD (hyperlipidemia)   . Hypertension   . Hypertensive heart disease without CHF   . Hypothyroidism   . Morbid obesity ([place]) 02/01/2018  . Peripheral vascular disease ([place]) 02/01/2018  . Stage 4 chronic kidney disease ([place]) 02/01/2018  . Stroke ([place])   . Tobacco abuse     Past Surgical History:  Procedure Laterality Date  . ABDOMINAL HYSTERECTOMY    . LAPAROSCOPY N/A 04/11/2018   Procedure: LAPAROSCOPY DIAGNOSTIC;  Surgeon: Rolm Bookbinder, MD;  Location: [place];  Service: General;  Laterality: N/A;  . LAPAROTOMY N/A 04/11/2018   Procedure: EXPLORATORY LAPAROTOMY;  Surgeon: Rolm Bookbinder, MD;  Location: [place];  Service: General;  Laterality: N/A;  . LYSIS OF ADHESION N/A 04/11/2018   Procedure: LYSIS OF ADHESION;  Surgeon: Rolm Bookbinder, MD;  Location: [place];  Service: General;  Laterality: N/A;  . PARTIAL COLECTOMY N/A 04/11/2018   Procedure: COLECTOMY;  Surgeon: Rolm Bookbinder, MD;  Location: [place];  Service: General;  Laterality: N/A;  .  REPLACEMENT TOTAL KNEE Right   . THYROID SURGERY      There were no vitals filed for this visit.   Subjective Assessment - 08/06/20 1037    Subjective [name] reports feeling the change in weather today (rain).  She reports "some" improvement in her low back and LE symptoms noted at evaluation.    Limitations Sitting;Writing;Reading;House hold activities;Lifting;Standing;Walking    How long can you sit comfortably? 30+ minutes    How long can you stand comfortably? < 5 minutes    How long can you walk comfortably? Inside the house mostly, avoids going out    Patient Stated Goals Reduce back pain, get stronger, walk better    Currently in Pain? Yes    Pain Score 6     Pain Location Back    Pain Orientation Right    Pain Descriptors / Indicators Aching    Pain Type Chronic pain    Pain Radiating Towards R leg from low back    Pain Onset Today    Pain Frequency Constant    Aggravating Factors  Prolonged postures    Pain Relieving Factors Change of position    Effect of Pain on Daily Activities Poor endurance with standing and weight-bearing function    Multiple Pain Sites No  [facility] Adult PT Treatment/Exercise - 08/06/20 0001      Posture/Postural Control   Posture/Postural Control Postural limitations    Postural Limitations Forward head;Rounded Shoulders;Decreased lumbar lordosis      Therapeutic Activites    Therapeutic Activities ADL's    ADL's Posture, position with gait, importance of walking and consistent HEP compliance, importance of avoiding prolonged sitting.      Exercises   Exercises Lumbar      Lumbar Exercises: Standing   Heel Raises 10 reps;3 seconds;Other (comment)    Heel Raises Limitations 2 sets of heel to toe raises with pelvic stabilization    Scapular Retraction Strengthening;Both;10 reps    Scapular Retraction Limitations 5 seconds shoulder blade pinches    Row Strengthening;AROM;Both;15 reps;Theraband     Theraband Level (Row) Level 3 (Green)    Other Standing Lumbar Exercises Standing scaption full can 5x   patient noted difficulty with this   Other Standing Lumbar Exercises Limited range trunk extension AROM 10X 3 seconds      Lumbar Exercises: Seated   Long Arc Quad on Chair Strengthening;Both;2 sets;10 reps    Sit to Stand 10 reps    Sit to Stand Limitations UE support    Other Seated Lumbar Exercises seated scapular retractions 10x with posture cueing    Other Seated Lumbar Exercises figure 4 stretch seated 20 sec x2 bilat                  PT Education - 08/06/20 1128    Education Details Again reviewed the importance of posture, frequent change of position, importance of consistent HEP compliance and daily walking.    Person(s) Educated Patient    Methods Explanation;Demonstration;Tactile cues;Verbal cues    Comprehension Verbalized understanding;Tactile cues required;Returned demonstration;Need further instruction;Verbal cues required               PT Long Term Goals - 08/06/20 1129      PT LONG TERM GOAL #1   Title Improve FOTO score to 44.    Status On-going      PT LONG TERM GOAL #2   Title Improve trunk extension AROM to 10 degrees or within a comfortable range > 0 degrees.    Status On-going      PT LONG TERM GOAL #3   Title Gera will be able to walk with a walker for 8-10 minutes without complaints of increased low back pain.    Baseline Very limited walking with pain and a cane.    Status On-going      PT LONG TERM GOAL #4   Title Orella will report low back pain consistently 0-4/10 on the Numeric Pain Rating Scale with no R leg pain or peripheral symptoms.    Status On-going      PT LONG TERM GOAL #5   Title Isidra will be independent with her HEP at DC.    Status On-going                 Plan - 08/06/20 1131    Clinical Impression Statement Andalasia notes "some" progress with her low back and R LE pain since starting PT, although she notes  "feeling" the change in the weather today (rain) due to her arthritis.  General strengthening with a core and leg emphasis, postural awareness and strength and improving walking endurance remain high priorities with [facility] physical therapy.    Personal Factors and Comorbidities Comorbidity 1    Comorbidities Previous R TKA, kidney disease, morbid  obesity, PVD, COPD, CAD, HTN, hypothyroid, previous CVA    Examination-Activity Limitations Bathing;Dressing;Sit;Transfers;Bed Mobility;Bend;Lift;Squat;Locomotion Level;Stairs;Carry;Stand    Examination-Participation Restrictions Community Activity;Meal Prep    Stability/Clinical Decision Making Stable/Uncomplicated    Rehab Potential Good    PT Frequency 2x / week    PT Duration 8 weeks    PT Treatment/Interventions ADLs/Self Care Home Management;Moist Heat;Cryotherapy;Therapeutic activities;Gait training;Therapeutic exercise;Neuromuscular re-education;Patient/family education;Manual techniques;Dry needling    PT Next Visit Plan General core and leg strengthening, work on posture and body mechanics    PT Home Exercise Plan Access Code: [email or web address]    Consulted and Agree with Plan of Care Patient           Patient will benefit from skilled therapeutic intervention in order to improve the following deficits and impairments:  Abnormal gait,Cardiopulmonary status limiting activity,Decreased activity tolerance,Decreased endurance,Decreased range of motion,Decreased mobility,Decreased strength,Difficulty walking,Hypomobility,Impaired flexibility,Increased muscle spasms,Postural dysfunction,Improper body mechanics,Obesity,Pain  Visit Diagnosis: Abnormal posture  Difficulty walking  Radiculopathy, lumbar region  Muscle weakness (generalized)     Problem List Patient Active Problem List   Diagnosis Date Noted  . Educated about COVID-19 virus infection 04/06/2020  . CKD (chronic kidney disease), stage IV ([place]) 04/06/2020  . CAP (community acquired  pneumonia) 01/28/2020  . Pneumonia due to COVID-19 virus 01/28/2020  . Dyslipidemia 04/07/2019  . Ileus following gastrointestinal surgery ([place])   . AKI (acute kidney injury) ([place]) 04/05/2018  . Acute perforated appendicitis 04/05/2018  . HTN (hypertension) 04/05/2018  . Aortic atherosclerosis ([place]) 02/01/2018  . Morbid obesity ([place]) 02/01/2018  . CKD (chronic kidney disease) stage 3, GFR 30-59 ml/min 02/01/2018  . Peripheral vascular disease ([place]) 02/01/2018  . Nonspecific chest pain 02/23/2015  . Hypertensive heart disease without CHF   . Coronary artery disease   . Tobacco abuse   . Hypothyroidism   . HLD (hyperlipidemia)   . Gout   . GERD (gastroesophageal reflux disease)     Farley Ly PT, MPT 08/06/2020, 11:34 AM  [facility] Physical Therapy [address] [place], [place], [address] Phone: [phone]   Fax:  [phone]  Name: FRANCIES MONTAGNINO MRN: [id number] Date of Birth: Feb 11, 1941

## 2020-08-06 NOTE — Patient Instructions (Signed)
Continue 3 exercises prescribed at evaluation with addition of brief walks in the house and outside with her wheeled walker daily.  Work up to 20 minute walks, starting with shorter 2-5 minute walks.

## 2020-08-10 ENCOUNTER — Other Ambulatory Visit: Payer: Self-pay | Admitting: Internal Medicine

## 2020-08-11 ENCOUNTER — Other Ambulatory Visit: Payer: Self-pay

## 2020-08-11 ENCOUNTER — Encounter: Payer: Self-pay | Admitting: Physical Therapy

## 2020-08-11 ENCOUNTER — Ambulatory Visit: Payer: Medicare HMO | Admitting: Physical Therapy

## 2020-08-11 DIAGNOSIS — R262 Difficulty in walking, not elsewhere classified: Secondary | ICD-10-CM

## 2020-08-11 DIAGNOSIS — M6281 Muscle weakness (generalized): Secondary | ICD-10-CM | POA: Diagnosis not present

## 2020-08-11 DIAGNOSIS — R293 Abnormal posture: Secondary | ICD-10-CM | POA: Diagnosis not present

## 2020-08-11 DIAGNOSIS — M5416 Radiculopathy, lumbar region: Secondary | ICD-10-CM

## 2020-08-11 LAB — URIC ACID: Uric Acid, Serum: 4.6 mg/dL (ref 2.5–7.0)

## 2020-08-11 LAB — CBC
HCT: 37 % (ref 35.0–45.0)
Hemoglobin: 12.1 g/dL (ref 11.7–15.5)
MCH: 25.5 pg — ABNORMAL LOW (ref 27.0–33.0)
MCHC: 32.7 g/dL (ref 32.0–36.0)
MCV: 77.9 fL — ABNORMAL LOW (ref 80.0–100.0)
MPV: 10.7 fL (ref 7.5–12.5)
Platelets: 229 10*3/uL (ref 140–400)
RBC: 4.75 10*6/uL (ref 3.80–5.10)
RDW: 17.7 % — ABNORMAL HIGH (ref 11.0–15.0)
WBC: 4.1 10*3/uL (ref 3.8–10.8)

## 2020-08-11 LAB — COMPLETE METABOLIC PANEL WITH GFR
AG Ratio: 1.6 (calc) (ref 1.0–2.5)
ALT: 9 U/L (ref 6–29)
AST: 17 U/L (ref 10–35)
Albumin: 4.1 g/dL (ref 3.6–5.1)
Alkaline phosphatase (APISO): 94 U/L (ref 37–153)
BUN/Creatinine Ratio: 11 (calc) (ref 6–22)
BUN: 15 mg/dL (ref 7–25)
CO2: 24 mmol/L (ref 20–32)
Calcium: 8.9 mg/dL (ref 8.6–10.4)
Chloride: 104 mmol/L (ref 98–110)
Creat: 1.35 mg/dL — ABNORMAL HIGH (ref 0.60–0.88)
GFR, Est African American: 43 mL/min/{1.73_m2} — ABNORMAL LOW (ref >=60)
GFR, Est Non African American: 37 mL/min/{1.73_m2} — ABNORMAL LOW (ref >=60)
Globulin: 2.6 g/dL (calc) (ref 1.9–3.7)
Glucose, Bld: 99 mg/dL (ref 65–99)
Potassium: 4 mmol/L (ref 3.5–5.3)
Sodium: 140 mmol/L (ref 135–146)
Total Bilirubin: 0.4 mg/dL (ref 0.2–1.2)
Total Protein: 6.7 g/dL (ref 6.1–8.1)

## 2020-08-11 LAB — T4, FREE: Free T4: 1 ng/dL (ref 0.8–1.8)

## 2020-08-11 LAB — TSH: TSH: 3.09 mIU/L (ref 0.40–4.50)

## 2020-08-11 NOTE — Therapy (Signed)
[facility] [address] [place], [place], [address] Phone: [phone]   Fax:  [phone]  Physical Therapy Treatment  Patient Details  Name: Joan Elliott MRN: [id number] Date of Birth: Jun 20, 1941 Referring Provider (PT): [name] MD   Encounter Date: 08/11/2020   PT End of Session - 08/11/20 1141    Visit Number 5    Date for PT Re-Evaluation 10/02/20    PT Start Time 1100    PT Stop Time 1145    PT Time Calculation (min) 45 min    Activity Tolerance Patient limited by fatigue;Patient tolerated treatment well;No increased pain    Behavior During Therapy WFL for tasks assessed/performed           Past Medical History:  Diagnosis Date  . Aortic atherosclerosis ([place]) 02/01/2018  . Arthritis   . COPD (chronic obstructive pulmonary disease) ([place])   . Coronary artery disease    s/p stent mid RCA 2003  . GERD (gastroesophageal reflux disease)   . Gout   . HLD (hyperlipidemia)   . Hypertension   . Hypertensive heart disease without CHF   . Hypothyroidism   . Morbid obesity ([place]) 02/01/2018  . Peripheral vascular disease ([place]) 02/01/2018  . Stage 4 chronic kidney disease ([place]) 02/01/2018  . Stroke ([place])   . Tobacco abuse     Past Surgical History:  Procedure Laterality Date  . ABDOMINAL HYSTERECTOMY    . LAPAROSCOPY N/A 04/11/2018   Procedure: LAPAROSCOPY DIAGNOSTIC;  Surgeon: Rolm Bookbinder, MD;  Location: [facility];  Service: General;  Laterality: N/A;  . LAPAROTOMY N/A 04/11/2018   Procedure: EXPLORATORY LAPAROTOMY;  Surgeon: Rolm Bookbinder, MD;  Location: [facility];  Service: General;  Laterality: N/A;  . LYSIS OF ADHESION N/A 04/11/2018   Procedure: LYSIS OF ADHESION;  Surgeon: Rolm Bookbinder, MD;  Location: [facility];  Service: General;  Laterality: N/A;  . PARTIAL COLECTOMY N/A 04/11/2018   Procedure: COLECTOMY;  Surgeon: Rolm Bookbinder, MD;  Location: [facility];  Service: General;  Laterality: N/A;  . REPLACEMENT TOTAL KNEE  Right   . THYROID SURGERY      There were no vitals filed for this visit.   Subjective Assessment - 08/11/20 1121    Subjective Pt arriving today reporting fatigue from not getting a good nights sleep. Pt reporting no pain upon arrival.    Limitations Sitting;Writing;Reading;House hold activities;Lifting;Standing;Walking    How long can you sit comfortably? 30+ minutes    How long can you stand comfortably? < 5 minutes    How long can you walk comfortably? Inside the house mostly, avoids going out    Patient Stated Goals Reduce back pain, get stronger, walk better    Currently in Pain? No/denies                             [facility] Adult PT Treatment/Exercise - 08/11/20 0001      Exercises   Exercises Lumbar      Lumbar Exercises: Stretches   Lower Trunk Rotation 4 reps;20 seconds    Pelvic Tilt 10 reps    Pelvic Tilt Limitations holding 3-5 seconds      Lumbar Exercises: Standing   Heel Raises 10 reps;Other (comment);2 seconds    Heel Raises Limitations 2 sets, with toe raises    Scapular Retraction Strengthening;Both;10 reps    Scapular Retraction Limitations 5 seconds shoulder blade pinches    Row Strengthening;AROM;Both;15 reps;Theraband  Theraband Level (Row) Level 3 (Green)    Other Standing Lumbar Exercises standing marching x 20 reps with UE support, standing hip abduction x 10 with UE support      Lumbar Exercises: Seated   Long Arc Quad on Chair Strengthening;Both;2 sets;10 reps    Sit to Stand 10 reps    Sit to Stand Limitations UE support from chair rails    Other Seated Lumbar Exercises figure 4 stretch seated 20 sec x2 bilat                       PT Long Term Goals - 08/06/20 1129      PT LONG TERM GOAL #1   Title Improve FOTO score to 44.    Status On-going      PT LONG TERM GOAL #2   Title Improve trunk extension AROM to 10 degrees or within a comfortable range > 0 degrees.    Status On-going      PT LONG TERM GOAL #3    Title Kadejia will be able to walk with a walker for 8-10 minutes without complaints of increased low back pain.    Baseline Very limited walking with pain and a cane.    Status On-going      PT LONG TERM GOAL #4   Title Lamisha will report low back pain consistently 0-4/10 on the Numeric Pain Rating Scale with no R leg pain or peripheral symptoms.    Status On-going      PT LONG TERM GOAL #5   Title Lesile will be independent with her HEP at DC.    Status On-going                 Plan - 08/11/20 1145    Clinical Impression Statement Pt tolerating exercises well. No pain reported in low back upon arrival. Pt requiring rest breaks througout session. Focusing on overall strengh, balance, and trunk mobility. Pt stating at end of session, "I can move much better since strating therapy". Pt stated she is now able to stand on her toes, and lay flat in her bed at night where before she was limited by AROM and pain. Cotninue skilled PT to progress toward LTG's set.    Personal Factors and Comorbidities Comorbidity 1    Comorbidities Previous R TKA, kidney disease, morbid obesity, PVD, COPD, CAD, HTN, hypothyroid, previous CVA    Examination-Activity Limitations Bathing;Dressing;Sit;Transfers;Bed Mobility;Bend;Lift;Squat;Locomotion Level;Stairs;Carry;Stand    Examination-Participation Restrictions Community Activity;Meal Prep    Stability/Clinical Decision Making Stable/Uncomplicated    Rehab Potential Good    PT Frequency 2x / week    PT Duration 8 weeks    PT Treatment/Interventions ADLs/Self Care Home Management;Moist Heat;Cryotherapy;Therapeutic activities;Gait training;Therapeutic exercise;Neuromuscular re-education;Patient/family education;Manual techniques;Dry needling    PT Next Visit Plan General core and leg strengthening, work on posture and body mechanics    PT Home Exercise Plan Access Code: [email or web address]    Consulted and Agree with Plan of Care Patient           Patient will  benefit from skilled therapeutic intervention in order to improve the following deficits and impairments:  Abnormal gait,Cardiopulmonary status limiting activity,Decreased activity tolerance,Decreased endurance,Decreased range of motion,Decreased mobility,Decreased strength,Difficulty walking,Hypomobility,Impaired flexibility,Increased muscle spasms,Postural dysfunction,Improper body mechanics,Obesity,Pain  Visit Diagnosis: Abnormal posture  Difficulty walking  Radiculopathy, lumbar region  Muscle weakness (generalized)     Problem List Patient Active Problem List   Diagnosis Date Noted  . Educated about COVID-19 virus infection 04/06/2020  .  CKD (chronic kidney disease), stage IV ([place]) 04/06/2020  . CAP (community acquired pneumonia) 01/28/2020  . Pneumonia due to COVID-19 virus 01/28/2020  . Dyslipidemia 04/07/2019  . Ileus following gastrointestinal surgery ([place])   . AKI (acute kidney injury) ([place]) 04/05/2018  . Acute perforated appendicitis 04/05/2018  . HTN (hypertension) 04/05/2018  . Aortic atherosclerosis ([place]) 02/01/2018  . Morbid obesity ([place]) 02/01/2018  . CKD (chronic kidney disease) stage 3, GFR 30-59 ml/min 02/01/2018  . Peripheral vascular disease ([place]) 02/01/2018  . Nonspecific chest pain 02/23/2015  . Hypertensive heart disease without CHF   . Coronary artery disease   . Tobacco abuse   . Hypothyroidism   . HLD (hyperlipidemia)   . Gout   . GERD (gastroesophageal reflux disease)     Oretha Caprice, PT, MPT 08/11/2020, 11:52 AM  [facility] [address] [place], [place], [address] Phone: [phone]   Fax:  [phone]  Name: FRAN HECKARD MRN: [id number] Date of Birth: 15-Dec-1940

## 2020-08-13 ENCOUNTER — Ambulatory Visit: Payer: Medicare HMO | Admitting: Rehabilitative and Restorative Service Providers"

## 2020-08-13 ENCOUNTER — Encounter: Payer: Self-pay | Admitting: Rehabilitative and Restorative Service Providers"

## 2020-08-13 ENCOUNTER — Other Ambulatory Visit: Payer: Self-pay

## 2020-08-13 DIAGNOSIS — R293 Abnormal posture: Secondary | ICD-10-CM

## 2020-08-13 DIAGNOSIS — R262 Difficulty in walking, not elsewhere classified: Secondary | ICD-10-CM | POA: Diagnosis not present

## 2020-08-13 DIAGNOSIS — M5416 Radiculopathy, lumbar region: Secondary | ICD-10-CM

## 2020-08-13 DIAGNOSIS — M6281 Muscle weakness (generalized): Secondary | ICD-10-CM

## 2020-08-13 NOTE — Patient Instructions (Signed)
Access Code: [email or web address] URL: [email or web address] Date: 08/13/2020 Prepared by: Vista Mink  Exercises Standing Scapular Retraction - 5 x daily - 7 x weekly - 1 sets - 5 reps - 5 second hold Heel Toe Raises with Counter Support - 2 x daily - 7 x weekly - 1 sets - 10 reps - 3 seconds hold Standing Lumbar Extension at [place] - 5 x daily - 7 x weekly - 1 sets - 5 reps - 3 seconds hold Sit to Stand with Armchair - 2 x daily - 7 x weekly - 1 sets - 10 reps Standing Hip Hiking - 2 x daily - 7 x weekly - 1 sets - 10 reps - 3 seconds hold

## 2020-08-13 NOTE — Therapy (Signed)
[facility] [address] [place], [place], [address] Phone: [phone]   Fax:  [phone]  Physical Therapy Treatment  Patient Details  Name: CHRISLEY ABBAS MRN: [id number] Date of Birth: 14-Sep-1940 Referring Provider (PT): [name] MD   Encounter Date: 08/13/2020   PT End of Session - 08/13/20 1118    Visit Number 6    Date for PT Re-Evaluation 10/02/20    PT Start Time 1026    PT Stop Time 1104    PT Time Calculation (min) 38 min    Activity Tolerance Patient limited by fatigue;Patient tolerated treatment well;No increased pain    Behavior During Therapy WFL for tasks assessed/performed           Past Medical History:  Diagnosis Date  . Aortic atherosclerosis ([place]) 02/01/2018  . Arthritis   . COPD (chronic obstructive pulmonary disease) ([place])   . Coronary artery disease    s/p stent mid RCA 2003  . GERD (gastroesophageal reflux disease)   . Gout   . HLD (hyperlipidemia)   . Hypertension   . Hypertensive heart disease without CHF   . Hypothyroidism   . Morbid obesity ([place]) 02/01/2018  . Peripheral vascular disease ([place]) 02/01/2018  . Stage 4 chronic kidney disease ([place]) 02/01/2018  . Stroke ([place])   . Tobacco abuse     Past Surgical History:  Procedure Laterality Date  . ABDOMINAL HYSTERECTOMY    . LAPAROSCOPY N/A 04/11/2018   Procedure: LAPAROSCOPY DIAGNOSTIC;  Surgeon: Rolm Bookbinder, MD;  Location: [facility];  Service: General;  Laterality: N/A;  . LAPAROTOMY N/A 04/11/2018   Procedure: EXPLORATORY LAPAROTOMY;  Surgeon: Rolm Bookbinder, MD;  Location: [facility];  Service: General;  Laterality: N/A;  . LYSIS OF ADHESION N/A 04/11/2018   Procedure: LYSIS OF ADHESION;  Surgeon: Rolm Bookbinder, MD;  Location: [facility];  Service: General;  Laterality: N/A;  . PARTIAL COLECTOMY N/A 04/11/2018   Procedure: COLECTOMY;  Surgeon: Rolm Bookbinder, MD;  Location: [facility];  Service: General;  Laterality: N/A;  . REPLACEMENT TOTAL KNEE  Right   . THYROID SURGERY      There were no vitals filed for this visit.   Subjective Assessment - 08/13/20 1027    Subjective Shaneika reports less back and leg pain over the past week.  She does have both but less than a week ago.  Standing and walking endurance are most functionally limiting.    Limitations Sitting;Writing;Reading;House hold activities;Lifting;Standing;Walking    How long can you sit comfortably? 30+ minutes    How long can you stand comfortably? < 5 minutes    How long can you walk comfortably? Inside the house mostly, avoids going out    Patient Stated Goals Reduce back pain, get stronger, walk better    Currently in Pain? Yes    Pain Score 5     Pain Location Back    Pain Orientation Lower    Pain Descriptors / Indicators Aching;Sore    Pain Type Chronic pain    Pain Radiating Towards To R knee    Pain Onset More than a month ago    Pain Frequency Intermittent    Aggravating Factors  Prolonged postures    Pain Relieving Factors Change position and do exercises    Effect of Pain on Daily Activities Poor endurance with standing and and weight-bearing function    Multiple Pain Sites No  [place] [facility] Adult PT Treatment/Exercise - 08/13/20 0001      Posture/Postural Control   Posture/Postural Control Postural limitations    Postural Limitations Forward head;Rounded Shoulders;Decreased lumbar lordosis      Therapeutic Activites    Therapeutic Activities ADL's    ADL's Posture, position with gait, importance of walking and consistent HEP compliance, importance of avoiding prolonged sitting, review and update of HEP      Exercises   Exercises Lumbar      Lumbar Exercises: Standing   Heel Raises 10 reps;3 seconds;Other (comment)    Heel Raises Limitations 2 sets of heel to toe raises with pelvic stabilization    Scapular Retraction Strengthening;Both;10 reps    Scapular Retraction Limitations 5 seconds shoulder blade  pinches    Row --    Theraband Level (Row) --    Other Standing Lumbar Exercises Standing alternating hip hike 2 sets of 10 for 3 seconds    Other Standing Lumbar Exercises Limited range trunk extension AROM 10X 3 seconds      Lumbar Exercises: Seated   Sit to Stand 10 reps   Combine with SBP and trunk extension AROM at top to get 3 exercises in                 PT Education - 08/13/20 1117    Education Details Reviewed and updated HEP.  Emphasis on core and LE strengthening, posture and frequent change of position.    Person(s) Educated Patient    Methods Explanation;Demonstration;Tactile cues;Verbal cues;Handout    Comprehension Verbalized understanding;Returned demonstration;Verbal cues required;Need further instruction;Tactile cues required               PT Long Term Goals - 08/13/20 1117      PT LONG TERM GOAL #1   Title Improve FOTO score to 44.    Status On-going      PT LONG TERM GOAL #2   Title Improve trunk extension AROM to 10 degrees or within a comfortable range > 0 degrees.    Status On-going      PT LONG TERM GOAL #3   Title Ayak will be able to walk with a walker for 8-10 minutes without complaints of increased low back pain.    Baseline Very limited walking with pain and a cane.    Status On-going      PT LONG TERM GOAL #4   Title Courtlynn will report low back pain consistently 0-4/10 on the Numeric Pain Rating Scale with no R leg pain or peripheral symptoms.    Status On-going      PT LONG TERM GOAL #5   Title Jalicia will be independent with her HEP at DC.    Status On-going                 Plan - 08/13/20 1119    Clinical Impression Statement Solangel reports better pain and better sleep since starting physical therapy.  Standing and walking endurance are most functionally limiting.  Kalayla's program continues to address posture, the importance of avoiding prolonged sitting, increased walking for exercise and consistent HEP compliance (5  exercise, updated today).  I will RA Imajean soon and update her LTG progress to make additional recommendations.    Personal Factors and Comorbidities Comorbidity 1    Comorbidities Previous R TKA, kidney disease, morbid obesity, PVD, COPD, CAD, HTN, hypothyroid, previous CVA    Examination-Activity Limitations Bathing;Dressing;Sit;Transfers;Bed Mobility;Bend;Lift;Squat;Locomotion Level;Stairs;Carry;Stand    Examination-Participation Restrictions Community Activity;Meal Prep  Stability/Clinical Decision Making Stable/Uncomplicated    Rehab Potential Good    PT Frequency 2x / week    PT Duration 8 weeks    PT Treatment/Interventions ADLs/Self Care Home Management;Moist Heat;Cryotherapy;Therapeutic activities;Gait training;Therapeutic exercise;Neuromuscular re-education;Patient/family education;Manual techniques;Dry needling    PT Next Visit Plan General core and leg strengthening, work on posture and body mechanics    PT Home Exercise Plan Access Code: [email or web address]    Consulted and Agree with Plan of Care Patient           Patient will benefit from skilled therapeutic intervention in order to improve the following deficits and impairments:  Abnormal gait,Cardiopulmonary status limiting activity,Decreased activity tolerance,Decreased endurance,Decreased range of motion,Decreased mobility,Decreased strength,Difficulty walking,Hypomobility,Impaired flexibility,Increased muscle spasms,Postural dysfunction,Improper body mechanics,Obesity,Pain  Visit Diagnosis: Abnormal posture  Difficulty walking  Radiculopathy, lumbar region  Muscle weakness (generalized)     Problem List Patient Active Problem List   Diagnosis Date Noted  . Educated about COVID-19 virus infection 04/06/2020  . CKD (chronic kidney disease), stage IV ([place]) 04/06/2020  . CAP (community acquired pneumonia) 01/28/2020  . Pneumonia due to COVID-19 virus 01/28/2020  . Dyslipidemia 04/07/2019  . Ileus following  gastrointestinal surgery ([place])   . AKI (acute kidney injury) ([place]) 04/05/2018  . Acute perforated appendicitis 04/05/2018  . HTN (hypertension) 04/05/2018  . Aortic atherosclerosis ([place]) 02/01/2018  . Morbid obesity ([place]) 02/01/2018  . CKD (chronic kidney disease) stage 3, GFR 30-59 ml/min 02/01/2018  . Peripheral vascular disease ([place]) 02/01/2018  . Nonspecific chest pain 02/23/2015  . Hypertensive heart disease without CHF   . Coronary artery disease   . Tobacco abuse   . Hypothyroidism   . HLD (hyperlipidemia)   . Gout   . GERD (gastroesophageal reflux disease)     Farley Ly PT, MPT 08/13/2020, [facility] [address] [place], [place], [address] Phone: [phone]   Fax:  [phone]  Name: STEPHANIEANN GILLAND MRN: [id number] Date of Birth: 1940/08/09

## 2020-08-18 ENCOUNTER — Ambulatory Visit (INDEPENDENT_AMBULATORY_CARE_PROVIDER_SITE_OTHER): Payer: Medicare HMO | Admitting: Physical Therapy

## 2020-08-18 ENCOUNTER — Other Ambulatory Visit: Payer: Self-pay

## 2020-08-18 DIAGNOSIS — M6281 Muscle weakness (generalized): Secondary | ICD-10-CM

## 2020-08-18 DIAGNOSIS — R262 Difficulty in walking, not elsewhere classified: Secondary | ICD-10-CM | POA: Diagnosis not present

## 2020-08-18 DIAGNOSIS — R293 Abnormal posture: Secondary | ICD-10-CM | POA: Diagnosis not present

## 2020-08-18 DIAGNOSIS — M5416 Radiculopathy, lumbar region: Secondary | ICD-10-CM

## 2020-08-18 NOTE — Therapy (Signed)
[facility] Therapy [address] [place], [place], [address] Phone: [phone]   Fax:  [phone]  Physical Therapy Treatment  Patient Details  Name: Joan Elliott MRN: [id number] Date of Birth: 12-Oct-1940 Referring Provider (PT): [name] MD   Encounter Date: 08/18/2020   PT End of Session - 08/18/20 1123    Visit Number 7    Number of Visits 16    Date for PT Re-Evaluation 10/02/20    Progress Note Due on Visit 10    PT Start Time 1100    PT Stop Time 1140    PT Time Calculation (min) 40 min    Activity Tolerance Patient limited by fatigue;Patient tolerated treatment well;No increased pain    Behavior During Therapy WFL for tasks assessed/performed           Past Medical History:  Diagnosis Date  . Aortic atherosclerosis ([place]) 02/01/2018  . Arthritis   . COPD (chronic obstructive pulmonary disease) ([place])   . Coronary artery disease    s/p stent mid RCA 2003  . GERD (gastroesophageal reflux disease)   . Gout   . HLD (hyperlipidemia)   . Hypertension   . Hypertensive heart disease without CHF   . Hypothyroidism   . Morbid obesity ([place]) 02/01/2018  . Peripheral vascular disease ([place]) 02/01/2018  . Stage 4 chronic kidney disease ([place]) 02/01/2018  . Stroke ([place])   . Tobacco abuse     Past Surgical History:  Procedure Laterality Date  . ABDOMINAL HYSTERECTOMY    . LAPAROSCOPY N/A 04/11/2018   Procedure: LAPAROSCOPY DIAGNOSTIC;  Surgeon: Rolm Bookbinder, MD;  Location: [place];  Service: General;  Laterality: N/A;  . LAPAROTOMY N/A 04/11/2018   Procedure: EXPLORATORY LAPAROTOMY;  Surgeon: Rolm Bookbinder, MD;  Location: [place];  Service: General;  Laterality: N/A;  . LYSIS OF ADHESION N/A 04/11/2018   Procedure: LYSIS OF ADHESION;  Surgeon: Rolm Bookbinder, MD;  Location: [place];  Service: General;  Laterality: N/A;  . PARTIAL COLECTOMY N/A 04/11/2018   Procedure: COLECTOMY;  Surgeon: Rolm Bookbinder, MD;  Location: [place];   Service: General;  Laterality: N/A;  . REPLACEMENT TOTAL KNEE Right   . THYROID SURGERY      There were no vitals filed for this visit.   Subjective Assessment - 08/18/20 1118    Subjective Pt arriving reporting not having a good night. Pt reporting feeling dizzy and light headed this morning. Pt stated that she had horrible cramps in her legs and feet last night. Pt reporting increased back pain    Limitations Sitting;Writing;Reading;House hold activities;Lifting;Standing;Walking    How long can you sit comfortably? 30+ minutes    How long can you stand comfortably? < 5 minutes    How long can you walk comfortably? Inside the house mostly, avoids going out    Patient Stated Goals Reduce back pain, get stronger, walk better    Currently in Pain? Yes    Pain Score 5    pain was 7/10 this morning   Pain Location Back    Pain Orientation Lower    Pain Descriptors / Indicators Aching;Sore    Pain Type Chronic pain    Pain Onset More than a month ago                             [facility] Adult PT Treatment/Exercise - 08/18/20 0001      Exercises   Exercises Lumbar  Lumbar Exercises: Aerobic   Nustep L4 x 6 minutes with stopping rest breaks and hydration with water   BP monitored every 3 minutes     Lumbar Exercises: Standing   Scapular Retraction Strengthening;Both;10 reps      Lumbar Exercises: Seated   Long Arc Quad on Chair Strengthening;AROM;Both;10 reps    Sit to Stand 5 reps      Lumbar Exercises: Supine   Bridge 10 reps                  PT Education - 08/18/20 1121    Education Details Edu pt on importance of staying hydrated to help with Leg cramps at night and importance of fall prevention.               PT Long Term Goals - 08/18/20 1136      PT LONG TERM GOAL #1   Title Improve FOTO score to 44.    Status On-going      PT LONG TERM GOAL #2   Title Improve trunk extension AROM to 10 degrees or within a comfortable range > 0  degrees.    Status On-going      PT LONG TERM GOAL #3   Title Chaney will be able to walk with a walker for 8-10 minutes without complaints of increased low back pain.    Baseline Very limited walking with pain and a cane.    Status On-going      PT LONG TERM GOAL #4   Title Janirah will report low back pain consistently 0-4/10 on the Numeric Pain Rating Scale with no R leg pain or peripheral symptoms.    Status On-going      PT LONG TERM GOAL #5   Title Benelli will be independent with her HEP at DC.                 Plan - 08/18/20 1124    Clinical Impression Statement Pt arriving to therapy reporting not feeling well with dizziness noted this morning  and incresaed LE cramping and low back pain. Pt's BP upon arrival was 152/86 after sitting rest breaks pt's BP was 140/82 and after 5 minutes of exercise pt's BP was 140/81 with HR 64. Pt's oxygen concentration at 99%.  Pt tolerating gentle exercises in sitting and supine today with rest breaks between each exercise. BP monitored thoughout session.  Pt was instructed to call her PCP if her symptoms worsened or didn't improve. Pt was issued a bottle of water after she felt she may be a little dehydrated. I reviewed the signs and symptoms of stroke and heart attack with pt. Continue skilled PT as pt tolerates.    Personal Factors and Comorbidities Comorbidity 3+    Comorbidities Previous R TKA, kidney disease, morbid obesity, PVD, COPD, CAD, HTN, hypothyroid, previous CVA    Examination-Activity Limitations Bathing;Dressing;Sit;Transfers;Bed Mobility;Bend;Lift;Squat;Locomotion Level;Stairs;Carry;Stand    Examination-Participation Restrictions Community Activity;Meal Prep    Stability/Clinical Decision Making Stable/Uncomplicated    Rehab Potential Good    PT Frequency 2x / week    PT Duration 8 weeks    PT Treatment/Interventions ADLs/Self Care Home Management;Moist Heat;Cryotherapy;Therapeutic activities;Gait training;Therapeutic  exercise;Neuromuscular re-education;Patient/family education;Manual techniques;Dry needling    PT Next Visit Plan General core and leg strengthening, work on posture and body mechanics    PT Home Exercise Plan Access Code: [email or web address]    Consulted and Agree with Plan of Care Patient           Patient  will benefit from skilled therapeutic intervention in order to improve the following deficits and impairments:  Abnormal gait,Cardiopulmonary status limiting activity,Decreased activity tolerance,Decreased endurance,Decreased range of motion,Decreased mobility,Decreased strength,Difficulty walking,Hypomobility,Impaired flexibility,Increased muscle spasms,Postural dysfunction,Improper body mechanics,Obesity,Pain  Visit Diagnosis: Abnormal posture  Difficulty walking  Radiculopathy, lumbar region  Muscle weakness (generalized)     Problem List Patient Active Problem List   Diagnosis Date Noted  . Educated about COVID-19 virus infection 04/06/2020  . CKD (chronic kidney disease), stage IV ([place]) 04/06/2020  . CAP (community acquired pneumonia) 01/28/2020  . Pneumonia due to COVID-19 virus 01/28/2020  . Dyslipidemia 04/07/2019  . Ileus following gastrointestinal surgery ([place])   . AKI (acute kidney injury) ([place]) 04/05/2018  . Acute perforated appendicitis 04/05/2018  . HTN (hypertension) 04/05/2018  . Aortic atherosclerosis ([place]) 02/01/2018  . Morbid obesity ([place]) 02/01/2018  . CKD (chronic kidney disease) stage 3, GFR 30-59 ml/min 02/01/2018  . Peripheral vascular disease ([place]) 02/01/2018  . Nonspecific chest pain 02/23/2015  . Hypertensive heart disease without CHF   . Coronary artery disease   . Tobacco abuse   . Hypothyroidism   . HLD (hyperlipidemia)   . Gout   . GERD (gastroesophageal reflux disease)     Oretha Caprice, PT, MPT 08/18/2020, 11:47 AM  [facility] Physical Therapy [address] [place], [place], [address] Phone: [phone]   Fax:   [phone]  Name: Joan Elliott MRN: [id number] Date of Birth: Dec 13, 1940

## 2020-08-20 ENCOUNTER — Encounter: Payer: Self-pay | Admitting: Rehabilitative and Restorative Service Providers"

## 2020-08-20 ENCOUNTER — Ambulatory Visit: Payer: Medicare HMO | Admitting: Rehabilitative and Restorative Service Providers"

## 2020-08-20 ENCOUNTER — Other Ambulatory Visit: Payer: Self-pay

## 2020-08-20 DIAGNOSIS — M5416 Radiculopathy, lumbar region: Secondary | ICD-10-CM | POA: Diagnosis not present

## 2020-08-20 DIAGNOSIS — M6281 Muscle weakness (generalized): Secondary | ICD-10-CM | POA: Diagnosis not present

## 2020-08-20 DIAGNOSIS — R293 Abnormal posture: Secondary | ICD-10-CM | POA: Diagnosis not present

## 2020-08-20 DIAGNOSIS — R262 Difficulty in walking, not elsewhere classified: Secondary | ICD-10-CM | POA: Diagnosis not present

## 2020-08-20 NOTE — Therapy (Addendum)
[facility] Physical Therapy [address] [place], [place], [address] Phone: [phone]   Fax:  [phone]  Physical Therapy Treatment/Reassessment  Patient Details  Name: Joan Elliott MRN: [id number] Date of Birth: July 26, 1940 Referring Provider (PT): Joan Oto MD  PHYSICAL THERAPY DISCHARGE SUMMARY  Visits from Start of Care: 8  Current functional level related to goals / functional outcomes: See note   Remaining deficits: See note   Education / Equipment: HEP  Plan:                                                    Patient goals were partially met. Patient is being discharged due to the physician's request.  ?????     Encounter Date: 08/20/2020   PT End of Session - 08/20/20 1703    Visit Number 8    Number of Visits 16    Date for PT Re-Evaluation 10/02/20    Progress Note Due on Visit 16    PT Start Time 1100    PT Stop Time 1145    PT Time Calculation (min) 45 min    Activity Tolerance Patient limited by fatigue;Patient tolerated treatment well;No increased pain    Behavior During Therapy WFL for tasks assessed/performed           Past Medical History:  Diagnosis Date  . Aortic atherosclerosis ([place]) 02/01/2018  . Arthritis   . COPD (chronic obstructive pulmonary disease) ([place])   . Coronary artery disease    s/p stent mid RCA 2003  . GERD (gastroesophageal reflux disease)   . Gout   . HLD (hyperlipidemia)   . Hypertension   . Hypertensive heart disease without CHF   . Hypothyroidism   . Morbid obesity ([place]) 02/01/2018  . Peripheral vascular disease ([place]) 02/01/2018  . Stage 4 chronic kidney disease ([place]) 02/01/2018  . Stroke ([place])   . Tobacco abuse     Past Surgical History:  Procedure Laterality Date  . ABDOMINAL HYSTERECTOMY    . LAPAROSCOPY N/A 04/11/2018   Procedure: LAPAROSCOPY DIAGNOSTIC;  Surgeon: Rolm Bookbinder, MD;  Location: [place];  Service: General;  Laterality: N/A;  . LAPAROTOMY N/A 04/11/2018   Procedure:  EXPLORATORY LAPAROTOMY;  Surgeon: Rolm Bookbinder, MD;  Location: [place];  Service: General;  Laterality: N/A;  . LYSIS OF ADHESION N/A 04/11/2018   Procedure: LYSIS OF ADHESION;  Surgeon: Rolm Bookbinder, MD;  Location: [place];  Service: General;  Laterality: N/A;  . PARTIAL COLECTOMY N/A 04/11/2018   Procedure: COLECTOMY;  Surgeon: Rolm Bookbinder, MD;  Location: [place];  Service: General;  Laterality: N/A;  . REPLACEMENT TOTAL KNEE Right   . THYROID SURGERY      There were no vitals filed for this visit.   Subjective Assessment - 08/20/20 1119    Subjective [name] reports her back pain is 30% improved vs evaluation.  R leg pain is gone but tightness and weakness persit.    Limitations Sitting;Writing;Reading;House hold activities;Lifting;Standing;Walking    How long can you sit comfortably? Can sit as long as she wants but PT recommends no more than 30 minutes before changing position    How long can you stand comfortably? 5-10 (was < 5 minutes)    How long can you walk comfortably? Walks inside the house do to endurance deficits    Patient Stated Goals  Better standing and walking endurance, better strength, continue to decrease back pain    Currently in Pain? Yes    Pain Score 7     Pain Location Back    Pain Orientation Lower    Pain Descriptors / Indicators Sore;Aching;Tightness    Pain Type Chronic pain    Pain Radiating Towards To R knee    Pain Onset More than a month ago    Pain Frequency Intermittent    Aggravating Factors  Prolonged postures    Pain Relieving Factors Change position and do exercises    Effect of Pain on Daily Activities Poor endurance with standing, walking and WB function    Multiple Pain Sites No              [facility] PT Assessment - 08/20/20 0001      AROM   Lumbar Extension 10                         [facility] Adult PT Treatment/Exercise - 08/20/20 0001      Posture/Postural Control   Posture/Postural Control Postural  limitations    Postural Limitations Forward head;Rounded Shoulders;Decreased lumbar lordosis      Therapeutic Activites    Therapeutic Activities Other Therapeutic Activities    Other Therapeutic Activities Reviewed RA findings and HEP      Exercises   Exercises Lumbar      Lumbar Exercises: Machines for Strengthening   Leg Press 2 sets of 10 slow eccentrics 50#      Lumbar Exercises: Standing   Heel Raises 10 reps;3 seconds;Other (comment)    Heel Raises Limitations 2 sets of heel to toe raises with pelvic stabilization    Scapular Retraction Strengthening;Both;10 reps    Scapular Retraction Limitations 5 seconds shoulder blade pinches    Other Standing Lumbar Exercises Standing alternating hip hike 2 sets of 10 for 3 seconds    Other Standing Lumbar Exercises Limited range trunk extension AROM 10X 3 seconds      Lumbar Exercises: Seated   Sit to Stand 5 reps;Other (comment)   Combine with SBP and trunk extension AROM at top to get 3 exercises in.  2 sets.                 PT Education - 08/20/20 1700    Education Details Reviewed HEP and RA findings.  Focus on strength and endurance.    Person(s) Educated Patient    Methods Explanation;Demonstration;Verbal cues    Comprehension Verbalized understanding;Returned demonstration;Verbal cues required;Need further instruction               PT Long Term Goals - 08/20/20 1700      PT LONG TERM GOAL #1   Title Improve FOTO score to 44.    Baseline 54 (was 34)    Status Achieved      PT LONG TERM GOAL #2   Title Improve trunk extension AROM to 10 degrees or within a comfortable range > 0 degrees.    Baseline 0 degrees at eval, 10 at RA    Status Achieved      PT LONG TERM GOAL #3   Title Joan Elliott will be able to walk with a walker for 8-10 minutes without complaints of increased low back pain.    Baseline Very limited walking with pain and a cane.    Status On-going      PT LONG TERM GOAL #4   Title Joan Elliott will  report low back pain consistently 0-4/10 on the Numeric Pain Rating Scale with no R leg pain or peripheral symptoms.    Baseline Can still be 7/10 although sciatica much better    Status On-going      PT LONG TERM GOAL #5   Title Joan Elliott will be independent with her HEP at DC.    Status On-going                 Plan - 08/20/20 1703    Clinical Impression Statement Joan Elliott is making excellent progress with her supervised physical therapy.  Low back pain is subjectively "30% better" as is sciatica.  Leg pain appears to be largely muscular and Joan Elliott will benefit from continued strength and postural work to improve standing and walking endurance and improve self-reported function.    Personal Factors and Comorbidities Comorbidity 3+    Comorbidities Previous R TKA, kidney disease, morbid obesity, PVD, COPD, CAD, HTN, hypothyroid, previous CVA    Examination-Activity Limitations Bathing;Dressing;Sit;Transfers;Bed Mobility;Bend;Lift;Squat;Locomotion Level;Stairs;Carry;Stand    Examination-Participation Restrictions Community Activity;Meal Prep    Stability/Clinical Decision Making Stable/Uncomplicated    Rehab Potential Good    PT Frequency 2x / week    PT Duration 6 weeks    PT Treatment/Interventions ADLs/Self Care Home Management;Moist Heat;Cryotherapy;Therapeutic activities;Gait training;Therapeutic exercise;Neuromuscular re-education;Patient/family education;Manual techniques;Dry needling    PT Next Visit Plan General core and leg strengthening, work on posture and body mechanics    PT Home Exercise Plan Access Code: [email or web address]    Consulted and Agree with Plan of Care Patient           Patient will benefit from skilled therapeutic intervention in order to improve the following deficits and impairments:  Abnormal gait,Cardiopulmonary status limiting activity,Decreased activity tolerance,Decreased endurance,Decreased range of motion,Decreased mobility,Decreased strength,Difficulty  walking,Hypomobility,Impaired flexibility,Increased muscle spasms,Postural dysfunction,Improper body mechanics,Obesity,Pain  Visit Diagnosis: Abnormal posture  Difficulty walking  Radiculopathy, lumbar region  Muscle weakness (generalized)     Problem List Patient Active Problem List   Diagnosis Date Noted  . Educated about COVID-19 virus infection 04/06/2020  . CKD (chronic kidney disease), stage IV ([place]) 04/06/2020  . CAP (community acquired pneumonia) 01/28/2020  . Pneumonia due to COVID-19 virus 01/28/2020  . Dyslipidemia 04/07/2019  . Ileus following gastrointestinal surgery ([place])   . AKI (acute kidney injury) ([place]) 04/05/2018  . Acute perforated appendicitis 04/05/2018  . HTN (hypertension) 04/05/2018  . Aortic atherosclerosis ([place]) 02/01/2018  . Morbid obesity ([place]) 02/01/2018  . CKD (chronic kidney disease) stage 3, GFR 30-59 ml/min 02/01/2018  . Peripheral vascular disease ([place]) 02/01/2018  . Nonspecific chest pain 02/23/2015  . Hypertensive heart disease without CHF   . Coronary artery disease   . Tobacco abuse   . Hypothyroidism   . HLD (hyperlipidemia)   . Gout   . GERD (gastroesophageal reflux disease)     Joan Elliott PT/MPT 08/20/2020, 5:06 PM  [facility] Physical Therapy [address] [place], [place], [address] Phone: [phone]   Fax:  [phone]  Name: Joan Elliott MRN: [id number] Date of Birth: Aug 03, 1940

## 2020-09-01 ENCOUNTER — Encounter: Payer: Medicare HMO | Admitting: Physical Therapy

## 2020-09-04 ENCOUNTER — Telehealth: Payer: Self-pay | Admitting: Rehabilitative and Restorative Service Providers"

## 2020-09-04 ENCOUNTER — Encounter: Payer: Medicare HMO | Admitting: Rehabilitative and Restorative Service Providers"

## 2020-09-04 NOTE — Telephone Encounter (Signed)
Left message reminding her of her Tuesday 3/8 appointment at 11 AM.  Asked her to call [phone] to cancel or reschedule if she is unable to attend this or any other appointment.

## 2020-09-08 ENCOUNTER — Encounter: Payer: Medicare HMO | Admitting: Physical Therapy

## 2020-09-10 ENCOUNTER — Encounter: Payer: Medicare HMO | Admitting: Rehabilitative and Restorative Service Providers"

## 2020-09-15 ENCOUNTER — Encounter: Payer: Medicare HMO | Admitting: Physical Therapy

## 2020-09-17 ENCOUNTER — Encounter: Payer: Medicare HMO | Admitting: Rehabilitative and Restorative Service Providers"

## 2021-04-25 NOTE — Progress Notes (Signed)
  Cardiology Office Note   Date:  04/26/2021   ID:  Joan Elliott, DOB 09/16/1940, MRN [id number]  PCP:  Avbuere, Edwin, MD  Cardiologist:   None   Chief Complaint  Patient presents with   Chest Pain       History of Present Illness: Joan Elliott is a 80 y.o. female who presents for follow up of CAD s/p stent mRCA 2003.  She was seen back in 2016 by Dr. Berry for chest pain and ruled out with troponins. She underwent a lexiscan that was normal.   She returns for yearly follow up.  She lives by herself.  She does not really have any family here.  She says she has a sister and some grandchildren who want her moved to [place] and she might do this next month.  She had been depressed and she has not taken any of her medicines in 3 months.  She has some chest discomfort.  It is very hard to tease the symptoms out.  This may or may not be the same as 2019 when she had a stress test but she says she is taking nitroglycerin occasionally like when she has to carry in the groceries.  This does not sound like it is routinely when she carries in groceries but it does seem like perhaps an increased pattern may be having to do with timing of her stopping her medications.  She does not seem to be describing resting chest discomfort.  She is not having any resting shortness of breath, PND or orthopnea.  She gets around slowly with a cane because of joint pains and weak legs.  She does walk through the grocery store and somebody drives her there.  It sounds like she does some minimal household chores.  Past Medical History:  Diagnosis Date   Aortic atherosclerosis (HCC) 02/01/2018   Arthritis    COPD (chronic obstructive pulmonary disease) (HCC)    Coronary artery disease    s/p stent mid RCA 2003   GERD (gastroesophageal reflux disease)    Gout    HLD (hyperlipidemia)    Hypertension    Hypertensive heart disease without CHF    Hypothyroidism    Morbid obesity (HCC) 02/01/2018   Peripheral  vascular disease (HCC) 02/01/2018   Stage 4 chronic kidney disease (HCC) 02/01/2018   Stroke (HCC)    Tobacco abuse     Past Surgical History:  Procedure Laterality Date   ABDOMINAL HYSTERECTOMY     LAPAROSCOPY N/A 04/11/2018   Procedure: LAPAROSCOPY DIAGNOSTIC;  Surgeon: Wakefield, Matthew, MD;  Location: [facility] OR;  Service: General;  Laterality: N/A;   LAPAROTOMY N/A 04/11/2018   Procedure: EXPLORATORY LAPAROTOMY;  Surgeon: Wakefield, Matthew, MD;  Location: [facility] OR;  Service: General;  Laterality: N/A;   LYSIS OF ADHESION N/A 04/11/2018   Procedure: LYSIS OF ADHESION;  Surgeon: Wakefield, Matthew, MD;  Location: [facility] OR;  Service: General;  Laterality: N/A;   PARTIAL COLECTOMY N/A 04/11/2018   Procedure: COLECTOMY;  Surgeon: Wakefield, Matthew, MD;  Location: [facility] OR;  Service: General;  Laterality: N/A;   REPLACEMENT TOTAL KNEE Right    THYROID SURGERY       Current Outpatient Medications  Medication Sig Dispense Refill   allopurinol (ZYLOPRIM) 100 MG tablet Take 1 tablet (100 mg total) by mouth daily. (Patient not taking: Reported on 04/26/2021) 30 tablet 1   clopidogrel (PLAVIX) 75 MG tablet Take 1 tablet (75 mg total) by mouth daily. (Patient not taking: Reported   on 04/26/2021) 30 tablet 12   diclofenac sodium (VOLTAREN) 1 % GEL Apply 4 g topically 4 (four) times daily as needed for pain. (Patient not taking: Reported on 04/26/2021)  5   DULoxetine (CYMBALTA) 20 MG capsule Take 1 capsule (20 mg total) by mouth daily. (Patient not taking: Reported on 04/26/2021) 30 capsule 3   gabapentin (NEURONTIN) 100 MG capsule TAKE 1 CAPSULE(100 MG) BY MOUTH AT BEDTIME (Patient not taking: Reported on 04/26/2021) 30 capsule 1   isosorbide mononitrate (IMDUR) 30 MG 24 hr tablet TAKE 1 TABLET (30 MG TOTAL) BY MOUTH DAILY. 90 tablet 3   levothyroxine (SYNTHROID) 75 MCG tablet Take 75 mcg by mouth daily before breakfast. (Patient not taking: Reported on 04/26/2021)     metoprolol tartrate (LOPRESSOR) 25 MG tablet  Take 0.5 tablets (12.5 mg total) by mouth 2 (two) times daily. (Patient not taking: Reported on 04/26/2021) 60 tablet 0   nitroGLYCERIN (NITROSTAT) 0.4 MG SL tablet DISSOLVE ONE TABLET UNDER THE TONGUE EVERY 5 MINUTES FOR 3 DOSES AS NEEDED FOR CHEST PAIN (Patient not taking: Reported on 04/26/2021) 90 tablet 3   pantoprazole (PROTONIX) 40 MG tablet Take 1 tablet (40 mg total) by mouth 2 (two) times daily. (Patient not taking: Reported on 04/26/2021) 60 tablet 3   rosuvastatin (CRESTOR) 40 MG tablet TAKE 1 TABLET (40 MG TOTAL) BY MOUTH DAILY. 90 tablet 3   tiZANidine (ZANAFLEX) 4 MG tablet Take 4 mg by mouth 2 (two) times daily as needed for muscle spasms. (Patient not taking: Reported on 04/26/2021)     No current facility-administered medications for this visit.    Allergies:   Ivp dye [iodinated diagnostic agents], Oxycodone, and Penicillins    ROS:  Please see the history of present illness.   Otherwise, review of systems are positive for follow up of none.   All other systems are reviewed and negative.    PHYSICAL EXAM: VS:  BP (!) 150/80   Pulse 80   Ht 5' (1.524 m)   Wt 200 lb (90.7 kg)   SpO2 96%   BMI 39.06 kg/m  , BMI Body mass index is 39.06 kg/m.  GENERAL:  Well appearing NECK:  No jugular venous distention, waveform within normal limits, carotid upstroke brisk and symmetric, no bruits, no thyromegaly LUNGS:  Clear to auscultation bilaterally CHEST:  Unremarkable HEART:  PMI not displaced or sustained,S1 and S2 within normal limits, no S3, no S4, no clicks, no rubs, no murmurs ABD:  Flat, positive bowel sounds normal in frequency in pitch, no bruits, no rebound, no guarding, no midline pulsatile mass, no hepatomegaly, no splenomegaly EXT:  2 plus pulses throughout, no edema, no cyanosis no clubbing  EKG:  EKG is  ordered today. Sinus rhythm, rate 80, axis within normal limits, IVCD, poor anterior R wave progression, no acute ST-T wave changes. No change from  previous  Recent Labs: 08/10/2020: ALT 9; BUN 15; Creat 1.35; Hemoglobin 12.1; Platelets 229; Potassium 4.0; Sodium 140; TSH 3.09    Lipid Panel    Component Value Date/Time   CHOL 136 06/18/2019 1246   TRIG 113 06/18/2019 1246   HDL 71 06/18/2019 1246   CHOLHDL 1.9 06/18/2019 1246   CHOLHDL 2.8 02/02/2018 0410   VLDL 13 02/02/2018 0410   LDLCALC 45 06/18/2019 1246      Wt Readings from Last 3 Encounters:  04/26/21 200 lb (90.7 kg)  07/10/20 195 lb (88.5 kg)  06/08/20 195 lb 9.6 oz (88.7 kg)        Other studies Reviewed: Additional studies/ records that were reviewed today include:  None. Review of the above records demonstrates:  Please see elsewhere in the note.     ASSESSMENT AND PLAN:  CAD:   The patient is having some chest discomfort.  I am going to screen her with a Lexiscan Myoview.   Barring any high risk findings I would not suggest further testing but she does need to be on her meds and we will be calling her to review what she has at home and what active prescriptions so that we can reinitiate her meds including her Imdur and antihypertensives.   HTN:  The blood pressure is elevated in part because she is not taking her medications and as above we will be reinitiating these.   DYSLIPIDEMIA:  LDL was not checked since 2020 and she will come back for fasting lipid profile as well as c-Met TSH and CBC.    CKD IIIA:   Her last creatinine in February was 1.35.  I will repeat this.   TOBACCO ABUSE:   She is unfortunately still smoking cigarettes we had this conversation again today about stopping smoking.  FATIGUE:    This has been a chronic complaint.  I will be checking a CBC and TSH as above.  She has poor sleep patterns.  Current medicines are reviewed at length with the patient today.  The patient does not have concerns regarding medicines.  The following changes have been made:   None  Labs/ tests ordered today include:   Orders Placed This Encounter   Procedures   CBC with Differential/Platelet   TSH   Lipid panel   Comprehensive metabolic panel   MYOCARDIAL PERFUSION IMAGING   EKG 12-Lead      Disposition:   FU with APP in 3 months if she does not move to [place].   Signed,  , MD  04/26/2021 10:55 AM    [facility] 

## 2021-04-26 ENCOUNTER — Encounter: Payer: Self-pay | Admitting: Cardiology

## 2021-04-26 ENCOUNTER — Telehealth (HOSPITAL_BASED_OUTPATIENT_CLINIC_OR_DEPARTMENT_OTHER): Payer: Self-pay | Admitting: *Deleted

## 2021-04-26 ENCOUNTER — Other Ambulatory Visit: Payer: Self-pay

## 2021-04-26 ENCOUNTER — Ambulatory Visit: Payer: Medicare Other | Admitting: Cardiology

## 2021-04-26 VITALS — BP 150/80 | HR 80 | Ht 60.0 in | Wt 200.0 lb

## 2021-04-26 DIAGNOSIS — N1831 Chronic kidney disease, stage 3a: Secondary | ICD-10-CM

## 2021-04-26 DIAGNOSIS — I2510 Atherosclerotic heart disease of native coronary artery without angina pectoris: Secondary | ICD-10-CM

## 2021-04-26 DIAGNOSIS — R0789 Other chest pain: Secondary | ICD-10-CM | POA: Diagnosis not present

## 2021-04-26 DIAGNOSIS — I10 Essential (primary) hypertension: Secondary | ICD-10-CM | POA: Diagnosis not present

## 2021-04-26 DIAGNOSIS — Z720 Tobacco use: Secondary | ICD-10-CM | POA: Diagnosis not present

## 2021-04-26 NOTE — Patient Instructions (Signed)
Medication Instructions:  WILL CALL YOU LATER TO GO OVER MEDICATIONS YOU HAVE AT HOME   *If you need a refill on your cardiac medications before your next appointment, please call your pharmacy*  Lab Work: FASTING LP/CMET/CBC/TSH WHEN YOU RETURN FOR LEXISCAN MYOVIEW   If you have labs (blood work) drawn today and your tests are completely normal, you will receive your results only by: [place] (if you have MyChart) OR A paper copy in the mail If you have any lab test that is abnormal or we need to change your treatment, we will call you to review the results.  Testing/Procedures: Your physician has requested that you have a lexiscan myoview. For further information please visit [email or web address]. Please follow instruction sheet, as given.  Follow-Up: At [facility], you and your health needs are our priority.  As part of our continuing mission to provide you with exceptional heart care, we have created designated Provider Care Teams.  These Care Teams include your primary Cardiologist (physician) and Advanced Practice Providers (APPs -  Physician Assistants and Nurse Practitioners) who all work together to provide you with the care you need, when you need it.  We recommend signing up for the patient portal called "MyChart".  Sign up information is provided on this After Visit Summary.  MyChart is used to connect with patients for Virtual Visits (Telemedicine).  Patients are able to view lab/test results, encounter notes, upcoming appointments, etc.  Non-urgent messages can be sent to your provider as well.   To learn more about what you can do with MyChart, go to [email or web address].    Your next appointment:   4 month(s)  The format for your next appointment:   In Person  Provider:   You will see one of the following Advanced Practice Providers on your designated Care Team:   Rosaria Ferries, PA-C Caron Presume, PA-C Jory Sims, DNP, ANP

## 2021-04-26 NOTE — Telephone Encounter (Signed)
Patient was seen by Dr Percival Spanish today  She had stopped all of her medications about 3 months ago Dr Percival Spanish requested patient be called to go over her medication bottles she had at home Per patient she bleow Allopurinol 100 mg daily Rosuvastatin 40 mg daily  Isosorbide Mon 30 mg daily  Levothyroxine 75 mcg daily  Clopidogrel 75 mg daily   Reviewed with Dr Percival Spanish and he suggested she resume above medications  Advised patient, verbalized understanding

## 2021-04-27 ENCOUNTER — Other Ambulatory Visit (HOSPITAL_BASED_OUTPATIENT_CLINIC_OR_DEPARTMENT_OTHER): Payer: Self-pay | Admitting: *Deleted

## 2021-04-27 DIAGNOSIS — R0789 Other chest pain: Secondary | ICD-10-CM

## 2021-04-27 DIAGNOSIS — I10 Essential (primary) hypertension: Secondary | ICD-10-CM

## 2021-04-27 DIAGNOSIS — I2510 Atherosclerotic heart disease of native coronary artery without angina pectoris: Secondary | ICD-10-CM

## 2021-05-04 ENCOUNTER — Telehealth (HOSPITAL_COMMUNITY): Payer: Self-pay | Admitting: *Deleted

## 2021-05-04 NOTE — Telephone Encounter (Signed)
Close encounter 

## 2021-05-05 ENCOUNTER — Other Ambulatory Visit: Payer: Self-pay

## 2021-05-05 ENCOUNTER — Ambulatory Visit (HOSPITAL_COMMUNITY)
Admission: RE | Admit: 2021-05-05 | Discharge: 2021-05-05 | Disposition: A | Discharge status: Home / Self Care | Payer: Medicare Other | Source: Ambulatory Visit | Attending: Cardiology | Admitting: Cardiology

## 2021-05-05 DIAGNOSIS — I10 Essential (primary) hypertension: Secondary | ICD-10-CM | POA: Diagnosis present

## 2021-05-05 DIAGNOSIS — I2510 Atherosclerotic heart disease of native coronary artery without angina pectoris: Secondary | ICD-10-CM | POA: Diagnosis not present

## 2021-05-05 DIAGNOSIS — R0789 Other chest pain: Secondary | ICD-10-CM | POA: Insufficient documentation

## 2021-05-05 LAB — MYOCARDIAL PERFUSION IMAGING
LV dias vol: 91 mL (ref 46–106)
LV sys vol: 49 mL
Nuc Stress EF: 47 %
Peak HR: 93 {beats}/min
Rest HR: 63 {beats}/min
Rest Nuclear Isotope Dose: 10.7 mCi
SDS: 1
SRS: 5
SSS: 6
ST Depression (mm): 0 mm
Stress Nuclear Isotope Dose: 30.6 mCi
TID: 1.2

## 2021-05-05 MED ORDER — AMINOPHYLLINE 25 MG/ML IV SOLN
75.0000 mg | Freq: Once | INTRAVENOUS | Status: AC
Start: 2021-05-05 — End: 2021-05-05
  Administered 2021-05-05: 75 mg via INTRAVENOUS

## 2021-05-05 MED ORDER — REGADENOSON 0.4 MG/5ML IV SOLN
0.4000 mg | Freq: Once | INTRAVENOUS | Status: AC
Start: 2021-05-05 — End: 2021-05-05
  Administered 2021-05-05: 0.4 mg via INTRAVENOUS

## 2021-05-05 MED ORDER — TECHNETIUM TC 99M TETROFOSMIN IV KIT
30.6000 | PACK | Freq: Once | INTRAVENOUS | Status: AC | PRN
  Administered 2021-05-05: 30.6 via INTRAVENOUS
  Filled 2021-05-05: qty 31

## 2021-05-05 MED ORDER — TECHNETIUM TC 99M TETROFOSMIN IV KIT
10.7000 | PACK | Freq: Once | INTRAVENOUS | Status: AC | PRN
  Administered 2021-05-05: 10.7 via INTRAVENOUS
  Filled 2021-05-05: qty 11

## 2021-07-01 ENCOUNTER — Emergency Department: Admit: 2021-07-01 | Payer: MEDICARE | Primary: Student in an Organized Health Care Education/Training Program

## 2021-07-01 ENCOUNTER — Inpatient Hospital Stay
Admit: 2021-07-01 | Discharge: 2021-07-01 | Disposition: A | Discharge status: Home Health | Payer: MEDICARE | Attending: Emergency Medicine

## 2021-07-01 DIAGNOSIS — M48061 Spinal stenosis, lumbar region without neurogenic claudication: Secondary | ICD-10-CM

## 2021-07-01 DIAGNOSIS — M79605 Pain in left leg: Secondary | ICD-10-CM

## 2021-07-01 LAB — CBC WITH AUTO DIFFERENTIAL
Basophils %: 0 % (ref 0–1)
Basophils Absolute: 0 10*3/uL (ref 0.0–0.1)
Eosinophils %: 0 % (ref 0–7)
Eosinophils Absolute: 0 10*3/uL (ref 0.0–0.4)
Granulocyte Absolute Count: 0.1 10*3/uL — ABNORMAL HIGH (ref 0.00–0.04)
Hematocrit: 41.2 % (ref 35.0–47.0)
Hemoglobin: 14 g/dL (ref 11.5–16.0)
Immature Granulocytes: 1 % — ABNORMAL HIGH (ref 0.0–0.5)
Lymphocytes %: 8 % — ABNORMAL LOW (ref 12–49)
Lymphocytes Absolute: 0.6 10*3/uL — ABNORMAL LOW (ref 0.8–3.5)
MCH: 28.5 PG (ref 26.0–34.0)
MCHC: 34 g/dL (ref 30.0–36.5)
MCV: 83.7 FL (ref 80.0–99.0)
MPV: 10.2 FL (ref 8.9–12.9)
Monocytes %: 11 % (ref 5–13)
Monocytes Absolute: 0.9 10*3/uL (ref 0.0–1.0)
NRBC Absolute: 0 10*3/uL (ref 0.00–0.01)
Neutrophils %: 80 % — ABNORMAL HIGH (ref 32–75)
Neutrophils Absolute: 6.4 10*3/uL (ref 1.8–8.0)
Nucleated RBCs: 0 PER 100 WBC
Platelets: 227 10*3/uL (ref 150–400)
RBC: 4.92 M/uL (ref 3.80–5.20)
RDW: 15.2 % — ABNORMAL HIGH (ref 11.5–14.5)
WBC: 8 10*3/uL (ref 3.6–11.0)

## 2021-07-01 LAB — URINALYSIS W/ REFLEX CULTURE
BACTERIA, URINE: NEGATIVE /hpf
Bacteria: NEGATIVE /hpf
Bilirubin, Urine: NEGATIVE
Bilirubin: NEGATIVE
Blood, Urine: NEGATIVE
Blood: NEGATIVE
Glucose, Ur: NEGATIVE mg/dL
Glucose: NEGATIVE mg/dL
Ketone: NEGATIVE mg/dL
Ketones, Urine: NEGATIVE mg/dL
Leukocyte Esterase, Urine: NEGATIVE
Leukocyte Esterase: NEGATIVE
Nitrite, Urine: NEGATIVE
Nitrites: NEGATIVE
Specific Gravity, UA: 1.01
Specific gravity: 1.01
Urobilinogen, UA, POCT: 1 EU/dL (ref 0.2–1.0)
Urobilinogen: 1 EU/dL (ref 0.2–1.0)
pH (UA): 6.5 (ref 5.0–8.0)
pH, UA: 6.5 (ref 5.0–8.0)

## 2021-07-01 LAB — COMPREHENSIVE METABOLIC PANEL
ALT: 15 U/L (ref 12–78)
AST: 23 U/L (ref 15–37)
Albumin/Globulin Ratio: 0.8 — ABNORMAL LOW (ref 1.1–2.2)
Albumin: 3.6 g/dL (ref 3.5–5.0)
Alkaline Phosphatase: 105 U/L (ref 45–117)
Anion Gap: 9 mmol/L (ref 5–15)
BUN: 13 MG/DL (ref 6–20)
Bun/Cre Ratio: 10 — ABNORMAL LOW (ref 12–20)
CO2: 29 mmol/L (ref 21–32)
Calcium: 9.2 MG/DL (ref 8.5–10.1)
Chloride: 102 mmol/L (ref 97–108)
Creatinine: 1.34 MG/DL — ABNORMAL HIGH (ref 0.55–1.02)
ESTIMATED GLOMERULAR FILTRATION RATE: 40 mL/min/{1.73_m2} — ABNORMAL LOW (ref >60)
Globulin: 4.3 g/dL — ABNORMAL HIGH (ref 2.0–4.0)
Glucose: 121 mg/dL — ABNORMAL HIGH (ref 65–100)
Potassium: 3.7 mmol/L (ref 3.5–5.1)
Sodium: 140 mmol/L (ref 136–145)
Total Bilirubin: 0.7 MG/DL (ref 0.2–1.0)
Total Protein: 7.9 g/dL (ref 6.4–8.2)

## 2021-07-01 LAB — CBC WITH AUTOMATED DIFF
ABS. BASOPHILS: 0 10*3/uL (ref 0.0–0.1)
ABS. EOSINOPHILS: 0 10*3/uL (ref 0.0–0.4)
ABS. IMM. GRANS.: 0.1 10*3/uL — ABNORMAL HIGH (ref 0.00–0.04)
ABS. LYMPHOCYTES: 0.6 10*3/uL — ABNORMAL LOW (ref 0.8–3.5)
ABS. MONOCYTES: 0.9 10*3/uL (ref 0.0–1.0)
ABS. NEUTROPHILS: 6.4 10*3/uL (ref 1.8–8.0)
ABSOLUTE NRBC: 0 10*3/uL (ref 0.00–0.01)
BASOPHILS: 0 % (ref 0–1)
EOSINOPHILS: 0 % (ref 0–7)
HCT: 41.2 % (ref 35.0–47.0)
HGB: 14 g/dL (ref 11.5–16.0)
IMMATURE GRANULOCYTES: 1 % — ABNORMAL HIGH (ref 0.0–0.5)
LYMPHOCYTES: 8 % — ABNORMAL LOW (ref 12–49)
MCH: 28.5 PG (ref 26.0–34.0)
MCHC: 34 g/dL (ref 30.0–36.5)
MCV: 83.7 FL (ref 80.0–99.0)
MONOCYTES: 11 % (ref 5–13)
MPV: 10.2 FL (ref 8.9–12.9)
NEUTROPHILS: 80 % — ABNORMAL HIGH (ref 32–75)
NRBC: 0 PER 100 WBC
PLATELET: 227 10*3/uL (ref 150–400)
RBC: 4.92 M/uL (ref 3.80–5.20)
RDW: 15.2 % — ABNORMAL HIGH (ref 11.5–14.5)
WBC: 8 10*3/uL (ref 3.6–11.0)

## 2021-07-01 LAB — METABOLIC PANEL, COMPREHENSIVE
A-G Ratio: 0.8 — ABNORMAL LOW (ref 1.1–2.2)
ALT (SGPT): 15 U/L (ref 12–78)
AST (SGOT): 23 U/L (ref 15–37)
Albumin: 3.6 g/dL (ref 3.5–5.0)
Alk. phosphatase: 105 U/L (ref 45–117)
Anion gap: 9 mmol/L (ref 5–15)
BUN/Creatinine ratio: 10 — ABNORMAL LOW (ref 12–20)
BUN: 13 MG/DL (ref 6–20)
Bilirubin, total: 0.7 MG/DL (ref 0.2–1.0)
CO2: 29 mmol/L (ref 21–32)
Calcium: 9.2 MG/DL (ref 8.5–10.1)
Chloride: 102 mmol/L (ref 97–108)
Creatinine: 1.34 MG/DL — ABNORMAL HIGH (ref 0.55–1.02)
Globulin: 4.3 g/dL — ABNORMAL HIGH (ref 2.0–4.0)
Glucose: 121 mg/dL — ABNORMAL HIGH (ref 65–100)
Potassium: 3.7 mmol/L (ref 3.5–5.1)
Protein, total: 7.9 g/dL (ref 6.4–8.2)
Sodium: 140 mmol/L (ref 136–145)
eGFR: 40 mL/min/{1.73_m2} — ABNORMAL LOW (ref >60)

## 2021-07-01 MED ORDER — ONDANSETRON 4 MG TAB, RAPID DISSOLVE
4 mg | Freq: Three times a day (TID) | ORAL | Status: AC | PRN
Start: 2021-07-01 — End: 2021-07-06

## 2021-07-01 MED ORDER — LEVOTHYROXINE 75 MCG TAB
75 mcg | ORAL | Status: DC
Start: 2021-07-01 — End: 2021-07-01

## 2021-07-01 MED ORDER — METOPROLOL TARTRATE 25 MG TAB
25 mg | Freq: Two times a day (BID) | ORAL | Status: DC
Start: 2021-07-01 — End: 2021-07-01

## 2021-07-01 MED ORDER — ISOSORBIDE MONONITRATE SR 30 MG 24 HR TAB
30 mg | Freq: Every day | ORAL | Status: DC
Start: 2021-07-01 — End: 2021-07-06
  Administered 2021-07-02 – 2021-07-06 (×5): via ORAL

## 2021-07-01 MED ORDER — METOPROLOL SUCCINATE SR 25 MG 24 HR TAB
25 mg | Freq: Every day | ORAL | Status: AC
Start: 2021-07-01 — End: 2021-07-06
  Administered 2021-07-02 – 2021-07-06 (×5): via ORAL

## 2021-07-01 MED ORDER — CLOPIDOGREL 75 MG TAB
75 mg | Freq: Every day | ORAL | Status: DC
Start: 2021-07-01 — End: 2021-07-06
  Administered 2021-07-02 – 2021-07-06 (×5): via ORAL

## 2021-07-01 MED ORDER — ROSUVASTATIN 10 MG TAB
10 mg | Freq: Every evening | ORAL | Status: AC
Start: 2021-07-01 — End: 2021-07-06
  Administered 2021-07-02 – 2021-07-06 (×5): via ORAL

## 2021-07-01 MED ORDER — OXYCODONE 5 MG TAB
5 mg | ORAL | Status: DC | PRN
Start: 2021-07-01 — End: 2021-07-02
  Administered 2021-07-01 – 2021-07-02 (×4): via ORAL

## 2021-07-01 MED ORDER — POLYETHYLENE GLYCOL 3350 17 GRAM (100 %) ORAL POWDER PACKET
17 gram | Freq: Every day | ORAL | Status: DC | PRN
Start: 2021-07-01 — End: 2021-07-06

## 2021-07-01 MED ORDER — SODIUM CHLORIDE 0.9 % IJ SYRG
Freq: Three times a day (TID) | INTRAMUSCULAR | Status: DC
Start: 2021-07-01 — End: 2021-07-06
  Administered 2021-07-01 – 2021-07-06 (×15): via INTRAVENOUS

## 2021-07-01 MED ORDER — ACETAMINOPHEN 650 MG RECTAL SUPPOSITORY
650 mg | Freq: Four times a day (QID) | RECTAL | Status: AC | PRN
Start: 2021-07-01 — End: 2021-07-06

## 2021-07-01 MED ORDER — ENOXAPARIN 40 MG/0.4 ML SUB-Q SYRINGE
40 mg/0.4 mL | Freq: Every day | SUBCUTANEOUS | Status: AC
Start: 2021-07-01 — End: 2021-07-06
  Administered 2021-07-02 – 2021-07-06 (×5): via SUBCUTANEOUS

## 2021-07-01 MED ORDER — HYDROCODONE-ACETAMINOPHEN 5 MG-325 MG TAB
5-325 mg | ORAL | Status: AC
Start: 2021-07-01 — End: 2021-07-01
  Administered 2021-07-01: 16:00:00 via ORAL

## 2021-07-01 MED ORDER — CYCLOBENZAPRINE 10 MG TAB
10 mg | Freq: Three times a day (TID) | ORAL | Status: DC | PRN
Start: 2021-07-01 — End: 2021-07-06
  Administered 2021-07-02: 02:00:00 via ORAL

## 2021-07-01 MED ORDER — NALOXONE 0.4 MG/ML INJECTION
0.4 mg/mL | INTRAMUSCULAR | Status: AC | PRN
Start: 2021-07-01 — End: 2021-07-06

## 2021-07-01 MED ORDER — LEVOTHYROXINE 25 MCG TAB
25 mcg | ORAL | Status: AC
Start: 2021-07-01 — End: 2021-07-06
  Administered 2021-07-02 – 2021-07-06 (×5): via ORAL

## 2021-07-01 MED ORDER — SODIUM CHLORIDE 0.9 % IJ SYRG
INTRAMUSCULAR | Status: AC | PRN
Start: 2021-07-01 — End: 2021-07-06
  Administered 2021-07-03: 14:00:00 via INTRAVENOUS

## 2021-07-01 MED ORDER — LEVOTHYROXINE 100 MCG TAB
100 mcg | ORAL | Status: DC
Start: 2021-07-01 — End: 2021-07-01

## 2021-07-01 MED ORDER — ACETAMINOPHEN 325 MG TABLET
325 mg | Freq: Four times a day (QID) | ORAL | Status: AC | PRN
Start: 2021-07-01 — End: 2021-07-06
  Administered 2021-07-03 – 2021-07-04 (×2): via ORAL

## 2021-07-01 MED ORDER — ONDANSETRON (PF) 4 MG/2 ML INJECTION
4 mg/2 mL | Freq: Four times a day (QID) | INTRAMUSCULAR | Status: AC | PRN
Start: 2021-07-01 — End: 2021-07-06

## 2021-07-01 MED FILL — OXYCODONE 5 MG TAB: 5 mg | ORAL | Qty: 2

## 2021-07-01 MED FILL — BD POSIFLUSH NORMAL SALINE 0.9 % INJECTION SYRINGE: INTRAMUSCULAR | Qty: 40

## 2021-07-01 MED FILL — SYNTHROID 75 MCG TABLET: 75 mcg | ORAL | Qty: 1

## 2021-07-01 MED FILL — ROSUVASTATIN 10 MG TAB: 10 mg | ORAL | Qty: 4

## 2021-07-01 MED FILL — HYDROCODONE-ACETAMINOPHEN 5 MG-325 MG TAB: 5-325 mg | ORAL | Qty: 1

## 2021-07-01 MED FILL — ISOSORBIDE MONONITRATE SR 30 MG 24 HR TAB: 30 mg | ORAL | Qty: 1

## 2021-07-01 NOTE — ED Notes (Signed)
Formatting of this note might be different from the original.  Pt assisted on bed pan   Electronically signed by Verdie Drown, RN at 07/01/2021  3:22 PM EST

## 2021-07-01 NOTE — Progress Notes (Signed)
[facility] Admission Pharmacy Medication Reconciliation     Information obtained from:patient (good historian)  RxQuery data available1:no    Comments/recommendations:  none    Medication changes (since last review):  Added  Albuterol inhaler  Removed  Aformoterol,lomotil, ergocalciferol, veramyst,  Hydrocodone-apap, lovastatin, methocarbamol, tramadol  Adjusted  Diclofenac, metoprolol ER    The [place] Prescription Monitoring Program (PMP) was accessed to determine fill history of any controlled medications.no   1RxQuery pharmacy benefit data reflects medications filled and processed through the patient's insurance, however                this data does NOT capture whether the medication was picked up or is currently being taken by the patient.         Patient allergies:   Allergies as of 07/01/2021 - Fully Reviewed 07/01/2021   Allergen Reaction Noted    Iodinated contrast media Unknown (comments) 11/26/2012    Pcn [penicillins] Hives 11/26/2012         Prior to Admission Medications   Prescriptions Last Dose Informant Patient Reported? Taking?   DULoxetine (CYMBALTA) 20 mg capsule 06/30/2021 Self Yes Yes   Sig: Take 20 mg by mouth daily.   LEVOTHYROXINE SODIUM (LEVOTHROID PO) 06/30/2021 Self Yes Yes   Sig: Take 75 mcg by mouth.   albuterol (PROVENTIL HFA, VENTOLIN HFA, PROAIR HFA) 90 mcg/actuation inhaler  Self Yes Yes   Sig: Take 1 Puff by inhalation as needed for Wheezing.   allopurinol (ZYLOPRIM) 100 mg tablet 06/30/2021 Self Yes Yes   Sig: Take 100 mg by mouth daily.   clopidogrel (PLAVIX) 75 mg tablet 06/30/2021 Self Yes Yes   Sig: Take  by mouth daily.   colchicine 0.6 mg tablet  Self Yes Yes   Sig: Take 0.6 mg by mouth daily.   diclofenac (VOLTAREN) 1 % gel 07/01/2021 Self No Yes   Sig: Apply  to affected area four (4) times daily.   Patient taking differently: Apply  to affected area as needed.   isosorbide mononitrate ER (IMDUR) 30 mg tablet 06/30/2021 Self Yes Yes   Sig: Take 30 mg by mouth daily.    levocetirizine (XYZAL) 5 mg tablet 06/30/2021 Self Yes Yes   Sig: Take 5 mg by mouth daily as needed.   metoprolol succinate (TOPROL-XL) 25 mg XL tablet  Self Yes Yes   Sig: Take 25 mg by mouth daily.   rosuvastatin (CRESTOR) 40 mg tablet 06/30/2021 Self Yes Yes   Sig: Take 40 mg by mouth nightly.      Facility-Administered Medications: None          Thank you,  Lemar Livings, [facility]

## 2021-07-01 NOTE — ED Notes (Signed)
Report attempted x1.

## 2021-07-01 NOTE — Progress Notes (Signed)
Formatting of this note is different from the original.  TRANSFER - IN REPORT:    Verbal report received from [facility], RN (name) on SURAYA VIDRINE  being received from ED (unit) for routine progression of care      Report consisted of patient?s Situation, Background, Assessment and   Recommendations(SBAR).     Information from the following report(s) SBAR, Kardex, ED Summary, Intake/Output, MAR, and Recent Results was reviewed with the receiving nurse.    Opportunity for questions and clarification was provided.      Assessment completed upon patient?s arrival to unit and care assumed.     1655 Pt arrived on unit via stretcher. Pt able to slide self over w/ help. Admission assessment complete. PIV access flushed & capped. Pt stated pain 10/10 in L leg. Heat pack provided as pain medication just admin. Pt placed on tele #3.     1810 Pt talking w/ pharmacy at bedside     1915 Shift report given to [place], Charity fundraiser (oncoming) from [place], Charity fundraiser (offgoing). Report included the following: SBAR, MAR, Kardex, Intake/Output, Recent Results and Cardiac Rhythm of NSR  Electronically signed by Baldo Daub, GN at 07/01/2021  6:43 PM EST

## 2021-07-01 NOTE — ED Notes (Signed)
To xray via stretcher at this time

## 2021-07-01 NOTE — Progress Notes (Signed)
Formatting of this note might be different from the original.    Problem: General Medical Care Plan  Goal: *Labs within defined limits  Outcome: Progressing Towards Goal    Problem: General Medical Care Plan  Goal: *Absence of infection signs and symptoms  Outcome: Progressing Towards Goal    Problem: General Medical Care Plan  Goal: *Skin integrity maintained  Outcome: Progressing Towards Goal    Problem: General Medical Care Plan  Goal: *Fluid volume balance  Outcome: Progressing Towards Goal    Electronically signed by Baldo Daub, GN at 07/01/2021  5:50 PM EST

## 2021-07-01 NOTE — Progress Notes (Signed)
Formatting of this note might be different from the original.  Date of previous inpatient admission/ ED visit? 09/12/19     What brought the patient back to ED? Patient presents to ED for c/o left knee/leg pain x few days. Patient denies injury or trauma. Patient states limited movement because of pain. Patient appears uncomfortable. Tender with palpation and movement. Left leg shortened and internally rotated.     Did patient decline recommended services during last admission/ ED visit (if yes, what)? no    Has patient seen a provider since their last inpatient admission/ED visit (if yes, when)?    PCP: First and Last name: new to [facility]   Name of Practice:    Are you a current patient: Yes/No:    Approximate date of last visit:    CM Interventions:  From previous inpatient admission/ED visit:  From current inpatient admission/ED visit    CM opened case for assessment of D/C planning needs, CM reviewed chart. CM completed assessment with pt at bedside. Pt is alert and oriented throughout encounter. CM introduced self/role, verified demographics, and discussed discharge planning.     CM receive consult patient needs new PCP follow-up CM schedule new appointment with [facility] 07/21/21 @ 0900.    -Address: [address]  -Phone #: [phone]  -Niece" Lorriane Shire [phone]    Patient have [employer] Medicare no Medicaid, Niece for [employer] explain process to family and patient, thinking of consumer direct. Per patient she has 2 appointments with Isaias Cowman 1/17 and 1/19 she states it is to see the provider and have her blood work done.     CM to call [employer] and confirm if Medicaid has been apply for patient. CM explain process of having 2 medicaid application.    CM to cancel appointment with [facility] on 07/21/21 @ 0900, inform patient to keep appointment with [facility].    Janyce Llanos CM  [phone]      Electronically signed by Janyce Llanos at 07/01/2021  3:51 PM EST

## 2021-07-01 NOTE — ED Notes (Signed)
Patient resting with eyes closed.  Equal chest rise and fall.  Rails up x2.

## 2021-07-01 NOTE — ED Notes (Signed)
Formatting of this note is different from the original.  Patient presents to ED for c/o left knee/leg pain x few days. Patient denies injury or trauma. Patient states limited movement because of pain. Patient appears uncomfortable. Tender with palpation and movement. Left leg shortened and internally rotated. Patient alert  & oriented x4, respirations even and unlabored, skin warm dry and intact, NAD.    Emergency Department Nursing Plan of Care     The Nursing Plan of Care is developed from the Nursing assessment and Emergency Department Attending provider initial evaluation.  The plan of care may be reviewed in the ?ED Provider note?Marland Kitchen    The Plan of Care was developed with the following considerations:   Patient / Family readiness to learn indicated ME:[id number] understanding, successful return demonstration and appropriate questions asked  Persons(s) to be included in education: patient  Barriers to Learning/Limitations:No    Signed     Danice Goltz, RN    07/01/2021   10:51 AM    Electronically signed by Danice Goltz, RN at 07/01/2021 10:51 AM EST

## 2021-07-01 NOTE — ED Provider Notes (Signed)
Formatting of this note is different from the original.  Images from the original note were not included.  EMERGENCY DEPARTMENT HISTORY AND PHYSICAL EXAM    Date: 07/01/2021  Patient Name: Tanya [name]    History of Presenting Illness     Chief Complaint   Patient presents with    Leg Pain     Pt c/o L leg pain and swelling x 3 days, denies falls or injuries.      History Provided By: Patient and granddaughter    HPI: Tanya [name], 80 y.o. female with past medical history significant for GERD, asthma, CAD, hypertension, and COPD who presents via EMS accompanied by her granddaughter to the ED with cc of left leg pain for the past 3 days.  Patient states she normally is ambulatory with a walker but has not been able to put any weight on the left leg for the past 3 days.  Her pain is described as a throbbing/aching pain that is worse with weightbearing or any movement.  Nothing makes the pain better.  She denies taking any medications for the pain.  She complains of pain and a swelling to the back of the left leg that radiates down to the foot.  She denies any recent falls or injury.  She tells me she has not been able to get around the house and has been scooting on the floor to get to and from the bathroom.  She recently moved from [place] and does not have a primary care.    PMHx: GERD, asthma, CAD, hypertension, and COPD  Social Hx: Smokes 1/4 pack/day, occasional alcohol use, denies illegal drug use    PCP: [facility], Not On File, MD    There are no other complaints, changes, or physical findings at this time.    No current facility-administered medications on file prior to encounter.     Current Outpatient Medications on File Prior to Encounter   Medication Sig Dispense Refill    DULoxetine (Cymbalta) 20 mg capsule Take 20 mg by mouth daily.      metoprolol tartrate (LOPRESSOR) 25 mg tablet Take 12.5 mg by mouth two (2) times a day.      levocetirizine (XYZAL) 5 mg tablet Take  by mouth.      rosuvastatin  (CRESTOR) 40 mg tablet Take 40 mg by mouth nightly.      isosorbide mononitrate ER (IMDUR) 30 mg tablet Take 30 mg by mouth daily.      LEVOTHYROXINE SODIUM (LEVOTHROID PO) Take 75 mcg by mouth.      clopidogrel (PLAVIX) 75 mg tablet Take  by mouth daily.      allopurinol (ZYLOPRIM) 100 mg tablet Take 100 mg by mouth daily.      traMADoL (ULTRAM) 50 mg tablet Take 50 mg by mouth every six (6) hours as needed for Pain.      diphenoxylate-atropine (LOMOTIL) 2.5-0.025 mg per tablet Take  by mouth four (4) times daily as needed for Diarrhea.      ergocalciferol (ERGOCALCIFEROL) 1,250 mcg (50,000 unit) capsule Take 50,000 Units by mouth.      methocarbamoL (Robaxin-750) 750 mg tablet Take 1 Tab by mouth three (3) times daily as needed for Muscle Spasm(s). (Patient not taking: Reported on 07/01/2021) 15 Tab 0    diclofenac (VOLTAREN) 1 % gel Apply  to affected area four (4) times daily. (Patient not taking: Reported on 07/01/2021) 100 g 0    HYDROcodone-acetaminophen (NORCO) 5-325 mg per tablet  Take 1 Tab by mouth every four (4) hours as needed. (Patient not taking: Reported on 07/01/2021) 10 Tab 0    lovastatin (MEVACOR) 40 mg tablet Take 40 mg by mouth nightly.      lisinopril (PRINIVIL, ZESTRIL) 40 mg tablet Take 40 mg by mouth daily.      colchicine 0.6 mg tablet Take 0.6 mg by mouth daily.      arformoteroL (BROVANA) 15 mcg/2 mL nebu neb solution 15 mcg by Nebulization route two (2) times daily as needed.      fluticasone (VERAMYST) 27.5 mcg/actuation nasal spray 2 Sprays by Nasal route two (2) times daily as needed for Rhinitis.       Past History     Past Medical History:  Past Medical History:   Diagnosis Date    Acid reflux     Asthma     CAD (coronary artery disease)     Hypertension      Past Surgical History:  Past Surgical History:   Procedure Laterality Date    HX ORTHOPAEDIC      right knee replacement    PR CARDIAC SURG PROCEDURE UNLIST      Stents     Family History:  History reviewed. No pertinent family  history.  Social History:  Social History     Tobacco Use    Smoking status: Every Day     Packs/day: 0.25     Years: 50.00     Pack years: 12.50     Types: Cigarettes    Smokeless tobacco: Never   Vaping Use    Vaping Use: Never used   Substance Use Topics    Alcohol use: Yes     Alcohol/week: 2.5 standard drinks     Types: 3 Cans of beer per week     Comment: 2-3 beers on weekend    Drug use: Never     Allergies:  Allergies   Allergen Reactions    Iodinated Contrast Media Unknown (comments)    Pcn [Penicillins] Hives     Review of Systems   Review of Systems   Constitutional:  Negative for chills and fever.   HENT:  Negative for congestion, rhinorrhea, sneezing and sore throat.    Eyes:  Negative for redness and visual disturbance.   Respiratory:  Negative for shortness of breath.    Cardiovascular:  Negative for leg swelling.   Gastrointestinal:  Negative for abdominal pain, nausea and vomiting.   Genitourinary:  Negative for difficulty urinating and frequency.   Musculoskeletal:  Positive for arthralgias and gait problem. Negative for back pain, myalgias and neck stiffness.   Skin:  Negative for rash.   Neurological:  Negative for dizziness, syncope, weakness and headaches.   Hematological:  Negative for adenopathy.   All other systems reviewed and are negative.  Physical Exam   Physical Exam  Vitals and nursing note reviewed.   Constitutional:       General: She is in acute distress.      Appearance: Normal appearance. She is well-developed.      Comments: In moderate distress   HENT:      Head: Normocephalic and atraumatic.   Eyes:      Conjunctiva/sclera: Conjunctivae normal.   Cardiovascular:      Rate and Rhythm: Normal rate and regular rhythm.      Pulses: Normal pulses.      Heart sounds: Normal heart sounds, S1 normal and S2 normal.   Pulmonary:  Effort: Pulmonary effort is normal. No respiratory distress.      Breath sounds: Normal breath sounds. No wheezing.   Abdominal:      General: Bowel sounds  are normal. There is no distension.      Palpations: Abdomen is soft.      Tenderness: no abdominal tenderness There is no rebound.   Musculoskeletal:      Cervical back: Full passive range of motion without pain, normal range of motion and neck supple.      Left hip: Tenderness and bony tenderness present. No deformity or crepitus. Decreased range of motion.      Left upper leg: Tenderness present.      Left knee: Bony tenderness present. Decreased range of motion.      Comments: Left leg shortened and externally rotated   Skin:     General: Skin is warm and dry.      Findings: No rash.   Neurological:      Mental Status: She is alert and oriented to person, place, and time.   Psychiatric:         Speech: Speech normal.         Behavior: Behavior normal.         Thought Content: Thought content normal.         Judgment: Judgment normal.     Diagnostic Study Results   Labs -    Recent Results (from the past 12 hour(s))   CBC WITH AUTOMATED DIFF    Collection Time: 07/01/21 10:51 AM   Result Value Ref Range    WBC 8.0 3.6 - 11.0 K/uL    RBC 4.92 3.80 - 5.20 M/uL    HGB 14.0 11.5 - 16.0 g/dL    HCT 16.1 09.6 - 04.5 %    MCV 83.7 80.0 - 99.0 FL    MCH 28.5 26.0 - 34.0 PG    MCHC 34.0 30.0 - 36.5 g/dL    RDW 40.9 (H) 81.1 - 14.5 %    PLATELET 227 150 - 400 K/uL    MPV 10.2 8.9 - 12.9 FL    NRBC 0.0 0 PER 100 WBC    ABSOLUTE NRBC 0.00 0.00 - 0.01 K/uL    NEUTROPHILS 80 (H) 32 - 75 %    LYMPHOCYTES 8 (L) 12 - 49 %    MONOCYTES 11 5 - 13 %    EOSINOPHILS 0 0 - 7 %    BASOPHILS 0 0 - 1 %    IMMATURE GRANULOCYTES 1 (H) 0.0 - 0.5 %    ABS. NEUTROPHILS 6.4 1.8 - 8.0 K/UL    ABS. LYMPHOCYTES 0.6 (L) 0.8 - 3.5 K/UL    ABS. MONOCYTES 0.9 0.0 - 1.0 K/UL    ABS. EOSINOPHILS 0.0 0.0 - 0.4 K/UL    ABS. BASOPHILS 0.0 0.0 - 0.1 K/UL    ABS. IMM. GRANS. 0.1 (H) 0.00 - 0.04 K/UL    DF SMEAR SCANNED      RBC COMMENTS ANISOCYTOSIS  1+    METABOLIC PANEL, COMPREHENSIVE    Collection Time: 07/01/21 10:51 AM   Result Value Ref Range    Sodium  140 136 - 145 mmol/L    Potassium 3.7 3.5 - 5.1 mmol/L    Chloride 102 97 - 108 mmol/L    CO2 29 21 - 32 mmol/L    Anion gap 9 5 - 15 mmol/L    Glucose 121 (H) 65 - 100 mg/dL    BUN 13  6 - 20 MG/DL    Creatinine 6.22 (H) 0.55 - 1.02 MG/DL    BUN/Creatinine ratio 10 (L) 12 - 20      eGFR 40 (L) >60 ml/min/1.31m2    Calcium 9.2 8.5 - 10.1 MG/DL    Bilirubin, total 0.7 0.2 - 1.0 MG/DL    ALT (SGPT) 15 12 - 78 U/L    AST (SGOT) 23 15 - 37 U/L    Alk. phosphatase 105 45 - 117 U/L    Protein, total 7.9 6.4 - 8.2 g/dL    Albumin 3.6 3.5 - 5.0 g/dL    Globulin 4.3 (H) 2.0 - 4.0 g/dL    A-G Ratio 0.8 (L) 1.1 - 2.2     URINALYSIS W/ REFLEX CULTURE    Collection Time: 07/01/21  1:30 PM    Specimen: Urine   Result Value Ref Range    Color YELLOW/STRAW      Appearance CLEAR CLEAR      Specific gravity 1.010      pH (UA) 6.5 5.0 - 8.0      Protein TRACE (A) NEG mg/dL    Glucose Negative NEG mg/dL    Ketone Negative NEG mg/dL    Bilirubin Negative NEG      Blood Negative NEG      Urobilinogen 1.0 0.2 - 1.0 EU/dL    Nitrites Negative NEG      Leukocyte Esterase Negative NEG      WBC 0-4 0 - 4 /hpf    RBC 0-5 0 - 5 /hpf    Epithelial cells FEW FEW /lpf    Bacteria Negative NEG /hpf    UA:UC IF INDICATED CULTURE NOT INDICATED BY UA RESULT CNI       Radiologic Studies -   CT PELV WO CONT   Final Result     No fracture or suspicious bone lesion.      XR HIP LT W OR WO PELV 2-3 VWS   Final Result   Technically limited positioning of the left hip for evaluation of the left   proximal femur. There is evidence of osteopenia and DJD. If there is significant   clinical concern of hip fracture, then recommend CT osseous pelvis for further   evaluation.     XR KNEE LT 3 V   Final Result   Joint effusion. DJD. No evidence of fracture.     CT HEAD WO CONT    (Results Pending)   CT SPINE LUMB WO CONT    (Results Pending)     XR HIP LT W OR WO PELV 2-3 VWS    Result Date: 07/01/2021  Technically limited positioning of the left hip for evaluation  of the left proximal femur. There is evidence of osteopenia and DJD. If there is significant clinical concern of hip fracture, then recommend CT osseous pelvis for further evaluation.    CT PELV WO CONT    Result Date: 07/01/2021  No fracture or suspicious bone lesion.     XR KNEE LT 3 V    Result Date: 07/01/2021  Joint effusion. DJD. No evidence of fracture.   Medical Decision Making   I am the first provider for this patient.    I reviewed the vital signs, available nursing notes, past medical history, past surgical history, family history and social history.    Vital Signs-Reviewed the patient's vital signs.  Patient Vitals for the past 24 hrs:   Temp Pulse Resp BP SpO2   07/01/21  1233 -- 78 -- (!) 193/94 99 %   07/01/21 1145 -- 85 18 (!) 170/86 95 %   07/01/21 1014 97.9 F (36.6 C) 96 22 (!) 180/102 100 %     Pulse Oximetry Analysis - 100% on RA (normal)    Records Reviewed: Nursing Notes    Provider Notes (Medical Decision Making):   80 year old female presents via EMS with left leg pain for the past 3 days and swelling. Her leg is shortened and externally rotated concerning for fracture.  Differential also includes DVT, osteoarthritis, and low suspicion for arterial occlusion.    ED Course:   Initial assessment performed. The patients presenting problems have been discussed, and they are in agreement with the care plan formulated and outlined with them.  I have encouraged them to ask questions as they arise throughout their visit.    11:01 AM  Franciso Bend, MD spoke with [facility], Consult for case management. Discussed HPI and PE, available diagnostic tests and clinical findings.  She is in agreement with care plans as outlined.  She will work on arranging primary care follow-up.    Progress Note  11:16 AM  Case management has arranged a primary care appointment for January 11th.    Case management informed me that the patient has already established care with Isai[facility] Cowman, her appointment is on January  19.    Progress Note  3:25 PM  I have re-evaluated pt and her labs and imaging are unremarkable.  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  Unclear why patient cannot ambulate.  Attempted to get the patient up with a walker but she requires 2 people to assist her just to get to a bedpan.  Will order a head CT and discussed with the hospitalist for admission.    3:29 PM  Franciso Bend, MD spoke with Dr. Jerolyn Center, Consult for Hospitalist. Discussed HPI and PE, available diagnostic tests and clinical findings.  He is in agreement with care plans as outlined.  He recommends CT of the lumbar spine to assess for herniated disc/fracture and he will see and evaluate the patient.    Progress Note:   Updated pt on all returned results and findings. Discussed the importance of proper follow up as referred below along with return precautions. Pt in agreement with the care plan and expresses agreement with and understanding of all items discussed.    Disposition:  ADMIT NOTE:  4:01 PM  The patient is being admitted to the hospital by Dr. Jerolyn Center.  The results of their tests and reasons for their admission have been discussed with the patient and/or available family.  They convey agreement and understanding for the need to be admitted and for their admission diagnosis.    PLAN:  1. Admit for leg weakness abnd further evaluation/treatment    Diagnosis     Clinical Impression:   1. Left leg pain    2. Hypertensive urgency      Please note that this dictation was completed with Dragon, Advertising account planner.  Quite often unanticipated grammatical, syntax, homophones, and other interpretive errors are inadvertently transcribed by the computer software.  Please disregard these errors.  Additionally, please excuse any errors that have escaped final proofreading.    Electronically signed by Costella Hatcher, MD at 07/01/2021  4:20 PM EST

## 2021-07-01 NOTE — ED Notes (Signed)
Patient states pain 10/10 in left leg.  Medicated at this time.

## 2021-07-01 NOTE — ED Notes (Signed)
 Patient presents to ED for c/o left knee/leg pain x few days. Patient denies injury or trauma. Patient states limited movement because of pain. Patient appears uncomfortable. Tender with palpation and movement. Left leg shortened and internally rotated. Patient alert  & oriented x4, respirations even and unlabored, skin warm dry and intact, NAD.      Emergency Department Nursing Plan of Care       The Nursing Plan of Care is developed from the Nursing assessment and Emergency Department Attending provider initial evaluation.  The plan of care may be reviewed in the "ED Provider note".    The Plan of Care was developed with the following considerations:   Patient / Family readiness to learn indicated ab:czmajopszi understanding, successful return demonstration and appropriate questions asked  Persons(s) to be included in education: patient  Barriers to Learning/Limitations:No    Signed     Epifania Dec, RN    07/01/2021   10:51 AM

## 2021-07-01 NOTE — ED Notes (Signed)
Pt assisted on bed pan

## 2021-07-01 NOTE — Progress Notes (Signed)
 Date of previous inpatient admission/ ED visit? 09/12/19     What brought the patient back to ED? Patient presents to ED for c/o left knee/leg pain x few days. Patient denies injury or trauma. Patient states limited movement because of pain. Patient appears uncomfortable. Tender with palpation and movement. Left leg shortened and internally rotated.     Did patient decline recommended services during last admission/ ED visit (if yes, what)? no    Has patient seen a provider since their last inpatient admission/ED visit (if yes, when)?    PCP: First and Last name: new to [facility]   Name of Practice:    Are you a current patient: Yes/No:    Approximate date of last visit:    CM Interventions:  From previous inpatient admission/ED visit:  From current inpatient admission/ED visit     CM opened case for assessment of D/C planning needs, CM reviewed chart. CM completed assessment with pt at bedside. Pt is alert and oriented throughout encounter. CM introduced self/role, verified demographics, and discussed discharge planning.     CM receive consult patient needs new PCP follow-up CM schedule new appointment with [facility] 07/21/21 @ 0900.    -Address: [address]  -Phone #: [phone]  -Niece Boyce [name] [phone]    Patient have [employer] Medicare no Medicaid, Niece for [employer] explain process to family and patient, thinking of consumer direct. Per patient she has 2 appointments with [facility] 1/17 and 1/19 she states it is to see the provider and have her blood work done.     CM to call [employer] and confirm if Medicaid has been apply for patient. CM explain process of having 2 medicaid application.    CM to cancel appointment with [facility] on 07/21/21 @ 0900, inform patient to keep appointment with [facility].    [place] CM  [phone]

## 2021-07-01 NOTE — Progress Notes (Signed)
Formatting of this note is different from the original.  [facility] Admission Pharmacy Medication Reconciliation     Information obtained from:patient (good historian)  RxQuery data available1:no    Comments/recommendations:  none    Medication changes (since last review):  Added  Albuterol inhaler  Removed  Aformoterol,lomotil, ergocalciferol, veramyst,  Hydrocodone-apap, lovastatin, methocarbamol, tramadol  Adjusted  Diclofenac, metoprolol ER    The [place] Prescription Monitoring Program (PMP) was accessed to determine fill history of any controlled medications.no   1RxQuery pharmacy benefit data reflects medications filled and processed through the patient's insurance, however                this data does NOT capture whether the medication was picked up or is currently being taken by the patient.     Patient allergies:   Allergies as of 07/01/2021 - Fully Reviewed 07/01/2021   Allergen Reaction Noted    Iodinated contrast media Unknown (comments) 11/26/2012    Pcn [penicillins] Hives 11/26/2012     Prior to Admission Medications   Prescriptions Last Dose Informant Patient Reported? Taking?   DULoxetine (CYMBALTA) 20 mg capsule 06/30/2021 Self Yes Yes   Sig: Take 20 mg by mouth daily.   LEVOTHYROXINE SODIUM (LEVOTHROID PO) 06/30/2021 Self Yes Yes   Sig: Take 75 mcg by mouth.   albuterol (PROVENTIL HFA, VENTOLIN HFA, PROAIR HFA) 90 mcg/actuation inhaler  Self Yes Yes   Sig: Take 1 Puff by inhalation as needed for Wheezing.   allopurinol (ZYLOPRIM) 100 mg tablet 06/30/2021 Self Yes Yes   Sig: Take 100 mg by mouth daily.   clopidogrel (PLAVIX) 75 mg tablet 06/30/2021 Self Yes Yes   Sig: Take  by mouth daily.   colchicine 0.6 mg tablet  Self Yes Yes   Sig: Take 0.6 mg by mouth daily.   diclofenac (VOLTAREN) 1 % gel 07/01/2021 Self No Yes   Sig: Apply  to affected area four (4) times daily.   Patient taking differently: Apply  to affected area as needed.   isosorbide mononitrate ER (IMDUR) 30 mg tablet 06/30/2021 Self Yes  Yes   Sig: Take 30 mg by mouth daily.   levocetirizine (XYZAL) 5 mg tablet 06/30/2021 Self Yes Yes   Sig: Take 5 mg by mouth daily as needed.   metoprolol succinate (TOPROL-XL) 25 mg XL tablet  Self Yes Yes   Sig: Take 25 mg by mouth daily.   rosuvastatin (CRESTOR) 40 mg tablet 06/30/2021 Self Yes Yes   Sig: Take 40 mg by mouth nightly.     Facility-Administered Medications: None       Thank you,  Lemar Livings, [facility]    Electronically signed by Lemar Livings, PHARMD at 07/01/2021  6:35 PM EST

## 2021-07-01 NOTE — Progress Notes (Signed)
Problem: General Medical Care Plan  Goal: *Labs within defined limits  Outcome: Progressing Towards Goal     Problem: General Medical Care Plan  Goal: *Absence of infection signs and symptoms  Outcome: Progressing Towards Goal     Problem: General Medical Care Plan  Goal: *Skin integrity maintained  Outcome: Progressing Towards Goal     Problem: General Medical Care Plan  Goal: *Fluid volume balance  Outcome: Progressing Towards Goal

## 2021-07-01 NOTE — Progress Notes (Signed)
 TRANSFER - IN REPORT:    Verbal report received from Lacee, RN (name) on Tanya [name]  being received from ED (unit) for routine progression of care      Report consisted of patient's Situation, Background, Assessment and   Recommendations(SBAR).     Information from the following report(s) SBAR, Kardex, ED Summary, Intake/Output, MAR, and Recent Results was reviewed with the receiving nurse.    Opportunity for questions and clarification was provided.      Assessment completed upon patient's arrival to unit and care assumed.     1655 Pt arrived on unit via stretcher. Pt able to slide self over w/ help. Admission assessment complete. PIV access flushed & capped. Pt stated pain 10/10 in L leg. Heat pack provided as pain medication just admin. Pt placed on tele #3.     1810 Pt talking w/ pharmacy at bedside     1915 Shift report given to [place], Charity fundraiser (oncoming) from [place], Charity fundraiser (offgoing). Report included the following: SBAR, MAR, Kardex, Intake/Output, Recent Results and Cardiac Rhythm of NSR

## 2021-07-01 NOTE — H&P (Signed)
Formatting of this note is different from the original.  Images from the original note were not included.    Hospitalist Admission Note    NAME: Tanya [name]   DOB:  1940-08-19   MRN:  [id number]   Room Number: 212/01  @ [facility]     Date/Time:  07/02/2021 4:05 PM    Patient PCP: Smith Mince, Not On File, MD    Please note that this dictation was completed with Dragon, the computer voice recognition software.  Quite often unanticipated grammatical, syntax, homophones, and other interpretive errors are inadvertently transcribed by the computer software.  Please disregard these errors.  Please excuse any errors that have escaped final proofreading.  ______________________________________________________________________  Given the patient's current clinical presentation, I have a high level of concern for decompensation if discharged from the emergency department.  Complex decision making was performed, which includes reviewing the patient's available past medical records, laboratory results, and x-ray films.       My assessment of this patient's clinical condition and my plan of care is as follows.    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  Moderate left foraminal stenosis.  Multifactorial mild to moderate spinal stenosis L3-L4 mild spinal stenosis at L2-L3.  Moderate left L5 neuroma  stenosis    -CT spine pending  -CT pelvis without any fracture or bone lesion  -X-ray hip with degenerative joint disease and osteopenia  -Patient states she was offered back surgery but could not get clearance.  Into tobacco use denies drug use or recreational clearance    -PT OT  -Pain management  -Follow-up CT spine imaging  -Resume colchicine allopurinol    #History of CAD status post stent to 2003  #History of hypothyroidism  #History of hypertension  #History of CKD 3B  #History of gout  #History of hyperlipidemia  #History of COPD  #History of arthritis  #History of stroke  -Resume home regimen once confirmed by pharmacy    Body mass index is 39.06 kg/m.  Code Status: DNR  Surrogate Decision Maker:    DVT Prophylaxis: Lovenox        Subjective:   CHIEF COMPLAINT: Left leg pain    HISTORY OF PRESENT ILLNESS:     Tanya [name] is a 80 y.o.  African American female with PMH of above-mentioned problems who presents to ED with c/o left leg pain and left knee pain for past couple of days.  Patient states that she was in her usual state of health when she had swelling of her left knee followed by severe pain in her left leg to the point that she cannot ambulate without assistance.  Patient states that she has balance noted and within the back surgeon in [place].  She could not going to surgery given given her cardiac condition.  Endorsed  tobacco use denied any excessive alcohol use or recreational drug use.  Denied any chest pain shortness of breath belly pain difficulty with bowel or bladder    We were asked to admit for work up and evaluation of the above problems.     Past Medical History:   Diagnosis Date    Acid reflux     Asthma     CAD (coronary artery disease)     Hypertension        Past Surgical History:   Procedure Laterality Date    HX ORTHOPAEDIC      right knee replacement    PR CARDIAC SURG PROCEDURE UNLIST      Stents     Social History     Tobacco Use    Smoking status: Every Day      Packs/day: 0.25     Years: 50.00     Pack years: 12.50     Types: Cigarettes    Smokeless tobacco: Never   Substance Use Topics    Alcohol use: Yes     Alcohol/week: 2.5 standard drinks     Types: 3 Cans of beer per week     Comment: 2-3 beers on weekend       History reviewed. No pertinent family history.  Allergies   Allergen Reactions    Iodinated Contrast Media Unknown (comments)    Pcn [Penicillins] Hives       Prior to Admission medications    Medication Sig Start Date End Date Taking? Authorizing Provider   DULoxetine (CYMBALTA) 20 mg capsule Take 20 mg by mouth daily.   Yes Other, Phys, MD   metoprolol succinate (TOPROL-XL) 25 mg XL tablet Take 25 mg by mouth daily.   Yes Provider, Historical   albuterol (PROVENTIL HFA, VENTOLIN HFA, PROAIR HFA) 90 mcg/actuation inhaler Take 1 Puff by inhalation as needed for Wheezing.   Yes Provider, Historical   levocetirizine (XYZAL) 5 mg tablet Take 5 mg by mouth daily as needed.   Yes Other, Phys, MD   rosuvastatin (CRESTOR) 40 mg tablet Take 40 mg by mouth nightly.   Yes Other, Phys, MD   diclofenac (VOLTAREN) 1 % gel Apply  to affected area four (4) times daily.  Patient taking differently: Apply  to affected area as needed. 09/12/19  Yes Marylu Lund, DO   isosorbide mononitrate ER (IMDUR) 30 mg tablet Take 30 mg by mouth daily.   Yes Provider, Historical   LEVOTHYROXINE SODIUM (LEVOTHROID PO) Take 75 mcg by mouth.   Yes Provider, Historical   clopidogrel (PLAVIX) 75 mg tablet Take  by mouth daily.   Yes Provider, Historical   allopurinol (ZYLOPRIM) 100 mg tablet Take 100 mg by mouth daily.   Yes Provider, Historical   colchicine 0.6 mg tablet Take 0.6 mg by mouth daily.   Yes Provider, Historical     REVIEW OF SYSTEMS:     I am not able to complete the review of systems because:   The patient is intubated and sedated    The patient has altered mental status due to his acute medical problems    The patient has baseline aphasia from prior stroke(s)    The patient has  baseline dementia and is not reliable historian    The patient is in acute medical distress and unable to provide information       Total of 12 systems reviewed as follows:       POSITIVE= underlined text  Negative = text  not underlined  General:  fever, chills, sweats, generalized weakness, weight loss/gain,      loss of appetite   Eyes:    blurred vision, eye pain, loss of vision, double vision  ENT:    rhinorrhea, pharyngitis   Respiratory:   cough, sputum production, SOB, DOE, wheezing, pleuritic pain   Cardiology:   chest pain, palpitations, orthopnea, PND, edema, syncope   Gastrointestinal:  abdominal pain , N/V, diarrhea, dysphagia, constipation, bleeding   Genitourinary:  frequency, urgency, dysuria, hematuria, incontinence   Muskuloskeletal :  arthralgia, myalgia, back pain Left leg pain   Hematology:  easy bruising, nose or gum bleeding, lymphadenopathy   Dermatological: rash, ulceration, pruritis, color change / jaundice  Endocrine:   hot flashes or polydipsia   Neurological:  headache, dizziness, confusion, focal weakness, paresthesia,     Speech difficulties, memory loss, gait difficulty  Psychological: Feelings of anxiety, depression, agitation    Objective:   VITALS:    Visit Vitals  BP (!) 155/81 (BP 1 Location: Right upper arm, BP Patient Position: At rest)   Pulse 94   Temp 98.7 F (37.1 C)   Resp 18   Ht 5' (1.524 m)   Wt 90.7 kg (200 lb)   SpO2 97%   BMI 39.06 kg/m     PHYSICAL EXAM:    General:    Alert, cooperative, no distress, appears stated age.     HEENT: Atraumatic, anicteric sclerae, pink conjunctivae     No oral ulcers, mucosa moist, throat clear, dentition fair  Neck:  Supple, symmetrical,  thyroid: non tender  Lungs:   Clear to auscultation bilaterally.  No Wheezing or Rhonchi. No rales.  Chest wall:  No tenderness  No Accessory muscle use.  Heart:   Regular  rhythm,  No  murmur   No edema  Abdomen:   Soft, non-tender. Not distended.  Bowel sounds normal  Extremities: No cyanosis.   No clubbing,   Left TTP, Straight leg raise + on L side     Skin turgor normal, Capillary refill normal, Radial dial pulse 2+  Skin:     Not pale.  Not Jaundiced  No rashes   Psych:  Good insight.  Not depressed.  Not anxious or agitated.  Neurologic: EOMs intact. No facial asymmetry. No aphasia or slurred speech. Symmetrical strength, Sensation grossly intact. Alert and oriented X 4.     ______________________________________________________________________    Care Plan discussed with:  Patient/Family    Expected  Disposition:  SNF/LTC  ________________________________________________________________________  TOTAL TIME:  45 Minutes    Critical Care Provided     Minutes non procedure based      Comments    x Reviewed previous records   >50% of visit spent in counseling and coordination of care x Discussion with patient and/or family and questions answered      ________________________________________________________________________  Signed: Danella Maiers, MD    Procedures: see electronic medical records for all procedures/Xrays and details which were not copied into this note but were reviewed prior to creation of Plan.    LAB DATA REVIEWED:    Recent Results (from the past 24 hour(s))   URINALYSIS W/ REFLEX CULTURE    Collection Time: 07/01/21  1:30 PM    Specimen: Urine   Result Value Ref Range    Color YELLOW/STRAW      Appearance CLEAR CLEAR      Specific gravity 1.010      pH (UA) 6.5 5.0 - 8.0  Protein TRACE (A) NEG mg/dL    Glucose Negative NEG mg/dL    Ketone Negative NEG mg/dL    Bilirubin Negative NEG      Blood Negative NEG      Urobilinogen 1.0 0.2 - 1.0 EU/dL    Nitrites Negative NEG      Leukocyte Esterase Negative NEG      WBC 0-4 0 - 4 /hpf    RBC 0-5 0 - 5 /hpf    Epithelial cells FEW FEW /lpf    Bacteria Negative NEG /hpf    UA:UC IF INDICATED CULTURE NOT INDICATED BY UA RESULT CNI         Electronically signed by Danella Maiers, MD at 07/02/2021 11:46 AM EST

## 2021-07-01 NOTE — ED Notes (Signed)
Formatting of this note is different from the original.  TRANSFER - OUT REPORT:    Verbal report given to Olivia, RN(name) on JASMAN PFEIFLE  being transferred to MED SURG(unit) for routine progression of care       Report consisted of patient?s Situation, Background, Assessment and   Recommendations(SBAR).     Information from the following report(s) SBAR, ED Summary and MAR was reviewed with the receiving nurse.    Lines:       Opportunity for questions and clarification was provided.      Patient transported with:   Registered Nurse  Electronically signed by Danice Goltz, RN at 07/01/2021  4:32 PM EST

## 2021-07-01 NOTE — ED Notes (Signed)
 TRANSFER - OUT REPORT:    Verbal report given to Olivia, RN(name) on DALYAH PLA  being transferred to MED SURG(unit) for routine progression of care       Report consisted of patient's Situation, Background, Assessment and   Recommendations(SBAR).     Information from the following report(s) SBAR, ED Summary and MAR was reviewed with the receiving nurse.    Lines:       Opportunity for questions and clarification was provided.      Patient transported with:   Registered Nurse

## 2021-07-01 NOTE — ED Notes (Signed)
Formatting of this note might be different from the original.  Patient resting with eyes closed.  Equal chest rise and fall.  Rails up x2.   Electronically signed by Danice Goltz, RN at 07/01/2021 12:34 PM EST

## 2021-07-01 NOTE — ED Notes (Signed)
Formatting of this note might be different from the original.  Patient states pain 10/10 in left leg.  Medicated at this time.  Electronically signed by Danice Goltz, RN at 07/01/2021  4:26 PM EST

## 2021-07-01 NOTE — H&P (Signed)
H&P by Danella Maiers, MD at 07/01/21 1605                Author: Danella Maiers, MD  Service: Hospitalist  Author Type: Physician       Filed: 07/02/21 1146  Date of Service: 07/01/21 1605  Status: Addendum          Editor: Danella Maiers, MD (Physician)          Related Notes: Original Note by Danella Maiers, MD (Physician) filed at 07/01/21 1612                                 Hospitalist Admission Note      NAME: Tanya [name]    DOB:  11-24-1940    MRN:  [id number]       Room Number:  212/01  @ [facility]        Date/Time:  07/02/2021 4:05 PM      Patient PCP: Smith Mince, Not On File, MD      Please note that this dictation was completed with Dragon, the computer voice recognition software.  Quite often unanticipated grammatical, syntax, homophones, and other interpretive errors are inadvertently  transcribed by the computer software.  Please disregard these errors.  Please excuse any errors that have escaped final proofreading.   ______________________________________________________________________   Given the patient's current clinical presentation, I have a high level of concern for decompensation if discharged from the emergency department.  Complex decision making was performed, which includes  reviewing the patient's available past medical records, laboratory results, and x-ray films.         My assessment of this patient's clinical condition and my plan of care is as follows.      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  Moderate left  foraminal stenosis.  Multifactorial mild to moderate spinal stenosis L3-L4 mild spinal stenosis at L2-L3.  Moderate left L5 neuroma stenosis      -CT spine pending   -CT pelvis without any fracture or bone lesion   -X-ray hip with degenerative joint disease and osteopenia   -Patient states she was offered back surgery but could not get clearance.  Into tobacco use denies drug use or recreational clearance         -PT OT   -Pain management   -Follow-up CT spine imaging   -Resume colchicine allopurinol         #History of CAD status post stent to 2003   #History of hypothyroidism   #History of hypertension   #History of CKD 3B   #History of gout   #  History of hyperlipidemia   #History of COPD   #History of arthritis   #History of stroke   -Resume home regimen once confirmed by pharmacy      Body mass index is 39.06 kg/m??.   Code Status: DNR   Surrogate Decision Maker:      DVT Prophylaxis: Lovenox                 Subjective:     CHIEF COMPLAINT: Left leg pain      HISTORY OF PRESENT ILLNESS:      Tanya [name] is a 80 y.o.  African American female with PMH of above-mentioned problems who presents to ED with c/o left leg pain and left knee pain for past couple of days.  Patient states that she was in her usual state of health when she had swelling  of her left knee followed by severe pain in her left leg to the point that she cannot ambulate without assistance.  Patient states that she has balance noted and [place]  She could not going to surgery given given her  cardiac condition.  Endorsed tobacco use denied any excessive alcohol use or recreational drug use.  Denied any chest pain shortness of breath belly pain difficulty with bowel or bladder      We were asked to admit for work up and  evaluation of the above problems.         Past Medical History:        Diagnosis  Date         ?  Acid reflux       ?  Asthma       ?  CAD (coronary artery disease)           ?  Hypertension                Past Surgical History:         Procedure  Laterality  Date          ?  HX ORTHOPAEDIC              right knee replacement          ?  PR CARDIAC SURG PROCEDURE UNLIST              Stents             Social History          Tobacco Use         ?  Smoking status:  Every Day              Packs/day:  0.25         Years:  50.00         Pack years:  12.50         Types:  Cigarettes         ?  Smokeless tobacco:  Never       Substance Use Topics         ?  Alcohol use:  Yes              Alcohol/week:  2.5 standard drinks         Types:  3 Cans of beer per week             Comment: 2-3 beers on weekend            History reviewed. No pertinent family history.  Allergies        Allergen  Reactions         ?  Iodinated Contrast Media  Unknown (comments)         ?  Pcn [Penicillins]  Hives              Prior to Admission medications             Medication  Sig  Start Date  End Date  Taking?  Authorizing Provider            DULoxetine (CYMBALTA) 20 mg capsule  Take 20 mg by mouth daily.      Yes  Other, Phys, MD     metoprolol succinate (TOPROL-XL) 25 mg XL tablet  Take 25 mg by mouth daily.      Yes  Provider, Historical     albuterol (PROVENTIL HFA, VENTOLIN HFA, PROAIR HFA) 90 mcg/actuation inhaler  Take 1 Puff by inhalation as needed for Wheezing.      Yes  Provider, Historical     levocetirizine (XYZAL) 5 mg tablet  Take 5 mg by mouth daily as needed.      Yes  Other, Phys, MD     rosuvastatin (CRESTOR) 40 mg tablet  Take 40 mg by mouth nightly.      Yes  Other, Phys, MD     diclofenac (VOLTAREN) 1 % gel  Apply  to affected area four (4) times daily.   Patient taking differently: Apply  to affected area as needed.  09/12/19    Yes  Marylu Lund, DO     isosorbide mononitrate ER (IMDUR) 30 mg tablet  Take 30 mg by mouth  daily.      Yes  Provider, Historical     LEVOTHYROXINE SODIUM (LEVOTHROID PO)  Take 75 mcg by mouth.      Yes  Provider, Historical     clopidogrel (PLAVIX) 75 mg tablet  Take  by mouth daily.      Yes  Provider, Historical     allopurinol (ZYLOPRIM) 100 mg tablet  Take 100 mg by mouth daily.      Yes  Provider, Historical            colchicine 0.6 mg tablet  Take 0.6 mg by mouth daily.      Yes  Provider, Historical           REVIEW OF SYSTEMS:      I am not able to complete the review of systems because:        The patient is intubated and sedated       The patient has altered mental status due to his acute medical problems       The patient has baseline aphasia from prior stroke(s)       The patient has baseline dementia and is not reliable historian       The patient is in acute medical distress and unable to provide information                     Total of 12 systems reviewed as follows:        POSITIVE= underlined text  Negative = text not underlined   General:  fever, chills, sweats, generalized weakness, weight loss/gain,       loss of appetite    Eyes:    blurred vision, eye pain, loss of vision, double vision   ENT:    rhinorrhea, pharyngitis  Respiratory:   cough, sputum production, SOB, DOE, wheezing, pleuritic pain    Cardiology:   chest pain, palpitations, orthopnea, PND, edema, syncope    Gastrointestinal:  abdominal pain , N/V, diarrhea, dysphagia, constipation, bleeding    Genitourinary:  frequency, urgency, dysuria, hematuria, incontinence    Muskuloskeletal :  arthralgia, myalgia, back pain Left leg pain    Hematology:  easy bruising, nose or gum bleeding, lymphadenopathy    Dermatological: rash, ulceration, pruritis, color change / jaundice   Endocrine:   hot flashes or polydipsia    Neurological:  headache, dizziness, confusion, focal weakness, paresthesia,      Speech difficulties, memory loss, gait difficulty   Psychological: Feelings of anxiety, depression, agitation        Objective:      VITALS:     Visit Vitals      BP  (!) 155/81 (BP 1 Location: Right upper arm, BP Patient Position: At rest)     Pulse  94     Temp  98.7 ??F (37.1 ??C)     Resp  18     Ht  5' (1.524 m)     Wt  90.7 kg (200 lb)     SpO2  97%        BMI  39.06 kg/m??           PHYSICAL EXAM:      General:    Alert, cooperative, no distress, appears stated age.      HEENT: Atraumatic, anicteric sclerae, pink conjunctivae      No oral ulcers, mucosa moist, throat clear, dentition fair   Neck:  Supple, symmetrical,  thyroid: non tender   Lungs:   Clear to auscultation bilaterally.  No Wheezing or Rhonchi. No rales.   Chest wall:  No tenderness  No Accessory muscle use.   Heart:   Regular  rhythm,  No  murmur   No edema   Abdomen:   Soft, non-tender. Not distended.  Bowel sounds normal   Extremities: No cyanosis.  No clubbing,   Left TTP, Straight leg raise + on L side      Skin turgor normal, Capillary refill normal, Radial dial pulse 2+   Skin:     Not pale.  Not Jaundiced  No rashes    Psych:  Good insight.  Not depressed.  Not anxious or agitated.   Neurologic: EOMs intact. No facial asymmetry. No aphasia or slurred speech. Symmetrical strength, Sensation grossly intact. Alert and oriented X 4.       ______________________________________________________________________      Care Plan discussed with:  Patient/Family      Expected  Disposition:  SNF/LTC   ________________________________________________________________________   TOTAL TIME:  45 Minutes      Critical Care Provided     Minutes non procedure based              Comments           x  Reviewed previous records         >50% of visit spent in counseling and coordination of care  x  Discussion with patient and/or family and questions answered           ________________________________________________________________________   Signed: Danella Maiers, MD      Procedures: see electronic medical records for all procedures/Xrays and details which were not copied into this note but  were reviewed prior to creation of Plan.      LAB DATA REVIEWED:  Recent Results (from the past 24 hour(s))     URINALYSIS W/ REFLEX CULTURE          Collection Time: 07/01/21  1:30 PM       Specimen: Urine         Result  Value  Ref Range            Color  YELLOW/STRAW          Appearance  CLEAR  CLEAR         Specific gravity  1.010          pH (UA)  6.5  5.0 - 8.0         Protein  TRACE (A)  NEG mg/dL       Glucose  Negative  NEG mg/dL       Ketone  Negative  NEG mg/dL       Bilirubin  Negative  NEG         Blood  Negative  NEG         Urobilinogen  1.0  0.2 - 1.0 EU/dL       Nitrites  Negative  NEG         Leukocyte Esterase  Negative  NEG         WBC  0-4  0 - 4 /hpf       RBC  0-5  0 - 5 /hpf       Epithelial cells  FEW  FEW /lpf       Bacteria  Negative  NEG /hpf            UA:UC IF INDICATED  CULTURE NOT INDICATED BY UA RESULT  CNI

## 2021-07-01 NOTE — ED Notes (Signed)
Formatting of this note might be different from the original.  Report attempted x1.   Electronically signed by Danice Goltz, RN at 07/01/2021  4:19 PM EST

## 2021-07-01 NOTE — ED Notes (Signed)
Formatting of this note might be different from the original.  To xray via stretcher at this time   Electronically signed by Danice Goltz, RN at 07/01/2021 11:53 AM EST

## 2021-07-01 NOTE — ED Provider Notes (Signed)
ED Provider Notes by Luther Hearing, MD at 07/01/21 1039                Author: Luther Hearing, MD  Service: Emergency Medicine  Author Type: Physician       Filed: 07/01/21 1620  Date of Service: 07/01/21 1039  Status: Signed          Editor: Luther Hearing, MD (Physician)               EMERGENCY DEPARTMENT HISTORY AND PHYSICAL EXAM           Date: 07/01/2021   Patient Name: Tanya [name]        History of Presenting Illness          Chief Complaint       Patient presents with        ?  Leg Pain             Pt c/o L leg pain and swelling x 3 days, denies falls or injuries.         History Provided By: Patient and granddaughter      HPI: BRIAH NARY, 80 y.o. female with past medical history significant for GERD, asthma, CAD, hypertension, and COPD who presents via EMS accompanied  by her granddaughter to the ED with cc of left leg pain for the past 3 days.  Patient states she normally is ambulatory with a walker but has not been able to put any weight on the left leg for the past 3 days.  Her pain is described as a throbbing/aching  pain that is worse with weightbearing or any movement.  Nothing makes the pain better.  She denies taking any medications for the pain.  She complains of pain and a swelling to the back of the left leg that radiates down to the foot.  She denies any recent  falls or injury.  She tells me she has not been able to get around the house and has been scooting on the floor to get to and from the bathroom.  She recently moved from [place] and does not have a primary care.      PMHx: GERD, asthma, CAD, hypertension, and COPD   Social Hx: Smokes 1/4 pack/day, occasional alcohol use, denies illegal drug use      PCP: [facility], Not On File, MD      There are no other complaints, changes, or physical findings at this time.        No current facility-administered medications on file prior to encounter.          Current Outpatient Medications on File Prior to  Encounter          Medication  Sig  Dispense  Refill           ?  DULoxetine (Cymbalta) 20 mg capsule  Take 20 mg by mouth daily.         ?  metoprolol tartrate (LOPRESSOR) 25 mg tablet  Take 12.5 mg by mouth two (2) times a day.         ?  levocetirizine (XYZAL) 5 mg tablet  Take  by mouth.               ?  rosuvastatin (CRESTOR) 40 mg tablet  Take 40 mg by mouth nightly.               ?  isosorbide mononitrate ER (IMDUR) 30 mg tablet  Take 30 mg by mouth daily.         ?  LEVOTHYROXINE SODIUM (LEVOTHROID PO)  Take 75 mcg by mouth.         ?  clopidogrel (PLAVIX) 75 mg tablet  Take  by mouth daily.         ?  allopurinol (ZYLOPRIM) 100 mg tablet  Take 100 mg by mouth daily.         ?  traMADoL (ULTRAM) 50 mg tablet  Take 50 mg by mouth every six (6) hours as needed for Pain.         ?  diphenoxylate-atropine (LOMOTIL) 2.5-0.025 mg per tablet  Take  by mouth four (4) times daily as needed for Diarrhea.         ?  ergocalciferol (ERGOCALCIFEROL) 1,250 mcg (50,000 unit) capsule  Take 50,000 Units by mouth.         ?  methocarbamoL (Robaxin-750) 750 mg tablet  Take 1 Tab by mouth three (3) times daily as needed for Muscle Spasm(s). (Patient not taking: Reported on 07/01/2021)  15 Tab  0     ?  diclofenac (VOLTAREN) 1 % gel  Apply  to affected area four (4) times daily. (Patient not taking: Reported on 07/01/2021)  100 g  0     ?  HYDROcodone-acetaminophen (NORCO) 5-325 mg per tablet  Take 1 Tab by mouth every four (4) hours as needed. (Patient not taking: Reported on 07/01/2021)  10 Tab  0     ?  lovastatin (MEVACOR) 40 mg tablet  Take 40 mg by mouth nightly.         ?  lisinopril (PRINIVIL, ZESTRIL) 40 mg tablet  Take 40 mg by mouth daily.         ?  colchicine 0.6 mg tablet  Take 0.6 mg by mouth daily.         ?  arformoteroL (BROVANA) 15 mcg/2 mL nebu neb solution  15 mcg by Nebulization route two (2) times daily as needed.               ?  fluticasone (VERAMYST) 27.5 mcg/actuation nasal spray  2 Sprays by Nasal  route two (2) times daily as needed for Rhinitis.              Past History        Past Medical History:     Past Medical History:        Diagnosis  Date         ?  Acid reflux       ?  Asthma       ?  CAD (coronary artery disease)           ?  Hypertension          Past Surgical History:     Past Surgical History:         Procedure  Laterality  Date          ?  HX ORTHOPAEDIC              right knee replacement          ?  PR CARDIAC SURG PROCEDURE UNLIST              Stents        Family History:   History reviewed. No pertinent family history.   Social History:     Social History  Tobacco Use         ?  Smoking status:  Every Day              Packs/day:  0.25         Years:  50.00         Pack years:  12.50         Types:  Cigarettes         ?  Smokeless tobacco:  Never       Vaping Use         ?  Vaping Use:  Never used       Substance Use Topics         ?  Alcohol use:  Yes              Alcohol/week:  2.5 standard drinks         Types:  3 Cans of beer per week             Comment: 2-3 beers on weekend         ?  Drug use:  Never        Allergies:     Allergies        Allergen  Reactions         ?  Iodinated Contrast Media  Unknown (comments)         ?  Pcn [Penicillins]  Hives          Review of Systems     Review of Systems    Constitutional:  Negative for chills and fever.    HENT:  Negative for congestion, rhinorrhea, sneezing and sore throat.     Eyes:  Negative for redness and visual disturbance.    Respiratory:  Negative for shortness of breath.     Cardiovascular:  Negative for leg swelling.    Gastrointestinal:  Negative for abdominal pain, nausea and vomiting.    Genitourinary:  Negative for difficulty urinating and frequency.    Musculoskeletal:  Positive for arthralgias and gait problem . Negative for back pain, myalgias and neck stiffness.    Skin:  Negative for rash.    Neurological:  Negative for dizziness, syncope, weakness and headaches.    Hematological:  Negative for adenopathy.    All  other systems reviewed and are negative.     Physical Exam     Physical Exam   Vitals and nursing note reviewed.    Constitutional:        General: She is in acute distress.       Appearance: Normal appearance. She is well-developed.       Comments: In moderate distress    HENT:       Head: Normocephalic and atraumatic.    Eyes:       Conjunctiva/sclera: Conjunctivae normal.     Cardiovascular:       Rate and Rhythm: Normal rate and regular rhythm.       Pulses: Normal pulses.       Heart sounds: Normal heart sounds, S1 normal and S2 normal.    Pulmonary:       Effort: Pulmonary effort is normal. No respiratory distress.       Breath sounds: Normal breath sounds. No wheezing.     Abdominal:       General: Bowel sounds are normal. There is no distension.       Palpations: Abdomen is soft.       Tenderness: no abdominal  tenderness There is no rebound.     Musculoskeletal:       Cervical back: Full passive range of motion without pain, normal range of motion and neck supple.       Left hip: Tenderness and bony tenderness  present. No deformity or crepitus. Decreased range of motion.       Left upper leg: Tenderness present.       Left knee: Bony tenderness present. Decreased range of motion .       Comments: Left leg shortened and externally rotated    Skin:      General: Skin is warm and dry.       Findings: No rash.    Neurological:       Mental Status: She is alert and oriented to person, place, and time.    Psychiatric:          Speech: Speech normal.          Behavior: Behavior normal.          Thought Content: Thought content normal.          Judgment: Judgment normal.         Diagnostic Study Results     Labs -         Recent Results (from the past 12 hour(s))     CBC WITH AUTOMATED DIFF          Collection Time: 07/01/21 10:51 AM         Result  Value  Ref Range            WBC  8.0  3.6 - 11.0 K/uL       RBC  4.92  3.80 - 5.20 M/uL       HGB  14.0  11.5 - 16.0 g/dL            HCT  41.2  35.0 - 47.0 %             MCV  83.7  80.0 - 99.0 FL       MCH  28.5  26.0 - 34.0 PG       MCHC  34.0  30.0 - 36.5 g/dL       RDW  15.2 (H)  11.5 - 14.5 %       PLATELET  227  150 - 400 K/uL       MPV  10.2  8.9 - 12.9 FL       NRBC  0.0  0 PER 100 WBC       ABSOLUTE NRBC  0.00  0.00 - 0.01 K/uL       NEUTROPHILS  80 (H)  32 - 75 %       LYMPHOCYTES  8 (L)  12 - 49 %       MONOCYTES  11  5 - 13 %       EOSINOPHILS  0  0 - 7 %       BASOPHILS  0  0 - 1 %       IMMATURE GRANULOCYTES  1 (H)  0.0 - 0.5 %       ABS. NEUTROPHILS  6.4  1.8 - 8.0 K/UL       ABS. LYMPHOCYTES  0.6 (L)  0.8 - 3.5 K/UL       ABS. MONOCYTES  0.9  0.0 - 1.0 K/UL       ABS. EOSINOPHILS  0.0  0.0 - 0.4 K/UL  ABS. BASOPHILS  0.0  0.0 - 0.1 K/UL       ABS. IMM. GRANS.  0.1 (H)  0.00 - 0.04 K/UL       DF  SMEAR SCANNED          RBC COMMENTS  ANISOCYTOSIS   1+             METABOLIC PANEL, COMPREHENSIVE          Collection Time: 07/01/21 10:51 AM         Result  Value  Ref Range            Sodium  140  136 - 145 mmol/L       Potassium  3.7  3.5 - 5.1 mmol/L       Chloride  102  97 - 108 mmol/L       CO2  29  21 - 32 mmol/L       Anion gap  9  5 - 15 mmol/L            Glucose  121 (H)  65 - 100 mg/dL            BUN  13  6 - 20 MG/DL       Creatinine  1.34 (H)  0.55 - 1.02 MG/DL       BUN/Creatinine ratio  10 (L)  12 - 20         eGFR  40 (L)  >60 ml/min/1.76m       Calcium  9.2  8.5 - 10.1 MG/DL       Bilirubin, total  0.7  0.2 - 1.0 MG/DL       ALT (SGPT)  15  12 - 78 U/L       AST (SGOT)  23  15 - 37 U/L       Alk. phosphatase  105  45 - 117 U/L       Protein, total  7.9  6.4 - 8.2 g/dL       Albumin  3.6  3.5 - 5.0 g/dL       Globulin  4.3 (H)  2.0 - 4.0 g/dL       A-G Ratio  0.8 (L)  1.1 - 2.2         URINALYSIS W/ REFLEX CULTURE          Collection Time: 07/01/21  1:30 PM       Specimen: Urine         Result  Value  Ref Range            Color  YELLOW/STRAW          Appearance  CLEAR  CLEAR         Specific gravity  1.010          pH (UA)  6.5  5.0 - 8.0         Protein   TRACE (A)  NEG mg/dL       Glucose  Negative  NEG mg/dL       Ketone  Negative  NEG mg/dL       Bilirubin  Negative  NEG         Blood  Negative  NEG         Urobilinogen  1.0  0.2 - 1.0 EU/dL       Nitrites  Negative  NEG         Leukocyte Esterase  Negative  NEG  WBC  0-4  0 - 4 /hpf       RBC  0-5  0 - 5 /hpf       Epithelial cells  FEW  FEW /lpf       Bacteria  Negative  NEG /hpf            UA:UC IF INDICATED  CULTURE NOT INDICATED BY UA RESULT  CNI             Radiologic Studies -      CT PELV WO CONT       Final Result          No fracture or suspicious bone lesion.             XR HIP LT W OR WO PELV 2-3 VWS       Final Result     Technically limited positioning of the left hip for evaluation of the left     proximal femur. There is evidence of osteopenia and DJD. If there is significant     clinical concern of hip fracture, then recommend CT osseous pelvis for further     evaluation.            XR KNEE LT 3 V       Final Result     Joint effusion. DJD. No evidence of fracture.            CT HEAD WO CONT    (Results Pending)       CT SPINE LUMB WO CONT    (Results Pending)        XR HIP LT W OR WO PELV 2-3 VWS      Result Date: 07/01/2021   Technically limited positioning of the left hip for evaluation of the left proximal femur. There is evidence of osteopenia and DJD. If there is significant clinical concern of hip fracture, then recommend CT osseous pelvis for further evaluation.      CT PELV WO CONT      Result Date: 07/01/2021   No fracture or suspicious bone lesion.       XR KNEE LT 3 V      Result Date: 07/01/2021   Joint effusion. DJD. No evidence of fracture.      Medical Decision Making     I am the first provider for this patient.      I reviewed the vital signs, available nursing notes, past medical history, past surgical history, family history and social history.      Vital Signs-Reviewed the patient's vital signs.   Patient Vitals for the past 24 hrs:            Temp  Pulse  Resp  BP   SpO2            07/01/21 1233  --  78  --  (!) 193/94  99 %            07/01/21 1145  --  85  18  (!) 170/86  95 %            07/01/21 1014  97.9 ??F (36.6 ??C)  96  22  (!) 180/102  100 %        Pulse Oximetry Analysis - 100% on RA (normal)     Records Reviewed: Nursing Notes      Provider Notes (Medical Decision Making):    80 year old female presents via EMS with left leg pain for the past 3  days and swelling. Her leg is shortened and externally rotated concerning for fracture.  Differential also includes DVT, osteoarthritis,  and low suspicion for arterial occlusion.      ED Course:    Initial assessment performed. The patients presenting problems have been discussed, and they are in agreement with the care plan formulated and outlined with them.  I have encouraged them to ask questions as they arise throughout their visit.      11:01 AM   Georgina Pillion, MD spoke with [facility], Consult for case management. Discussed HPI and PE, available diagnostic tests and clinical findings.  She is in agreement with care plans as outlined.  She will work on arranging primary care follow-up.      Progress Note   11:16 AM   Case management has arranged a primary care appointment for January 11th.      Case management informed me that the patient has already established care with Ernestina Patches, her appointment is on January 19.      Progress Note   3:25 PM   I have re-evaluated pt and her labs and imaging are unremarkable.  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  Unclear why patient cannot ambulate.  Attempted to get the patient up with a walker but she requires  2 people to assist her just to get to a bedpan.  Will order a head CT and discussed with the hospitalist for admission.      3:29 PM   Georgina Pillion, MD spoke with Dr. Verda Cumins, Consult for Hospitalist. Discussed HPI and PE, available diagnostic tests and clinical findings.  He is in agreement with care plans as outlined.   He recommends CT of the lumbar spine to assess for herniated  disc/fracture and he will see and evaluate the patient.      Progress Note:    Updated pt on all returned results and findings. Discussed the importance of proper follow up as referred below along with return precautions. Pt in agreement with the care plan and expresses agreement with and understanding of all items discussed.      Disposition:   ADMIT NOTE:   4:01 PM   The patient is being admitted to the hospital by Dr. Verda Cumins.  The results of their tests and reasons for their admission have been discussed with the patient and/or available family.  They convey  agreement and understanding for the need to be admitted and for their admission diagnosis.      PLAN:   1. Admit for leg weakness abnd further evaluation/treatment        Diagnosis        Clinical Impression:       1.  Left leg pain         2.  Hypertensive urgency                    Please note that this dictation was completed with Dragon, Acupuncturist.  Quite often unanticipated grammatical, syntax, homophones, and other interpretive  errors are inadvertently transcribed by the computer software.  Please disregard these errors.  Additionally, please excuse any errors that have escaped final proofreading.

## 2021-07-02 ENCOUNTER — Observation Stay: Admit: 2021-07-02 | Payer: MEDICARE | Primary: Student in an Organized Health Care Education/Training Program

## 2021-07-02 ENCOUNTER — Observation Stay: Admit: 2021-07-02 | Payer: MEDICARE | Primary: Family

## 2021-07-02 LAB — URIC ACID
Uric Acid, Serum: 5.5 MG/DL (ref 2.6–6.0)
Uric acid: 5.5 MG/DL (ref 2.6–6.0)

## 2021-07-02 MED ORDER — COLCHICINE 0.6 MG TAB
0.6 mg | Freq: Every day | ORAL | Status: DC
Start: 2021-07-02 — End: 2021-07-06
  Administered 2021-07-02 – 2021-07-06 (×5): via ORAL

## 2021-07-02 MED ORDER — DULOXETINE 20 MG CAP, DELAYED RELEASE
20 mg | Freq: Every day | ORAL | Status: DC
Start: 2021-07-02 — End: 2021-07-06
  Administered 2021-07-02 – 2021-07-06 (×5): via ORAL

## 2021-07-02 MED ORDER — HYDRALAZINE 20 MG/ML IJ SOLN
20 mg/mL | Freq: Four times a day (QID) | INTRAMUSCULAR | Status: AC | PRN
Start: 2021-07-02 — End: 2021-07-06

## 2021-07-02 MED ORDER — KETOROLAC TROMETHAMINE 30 MG/ML INJECTION
30 mg/mL (1 mL) | INTRAMUSCULAR | Status: AC
Start: 2021-07-02 — End: 2021-07-02
  Administered 2021-07-02: 18:00:00 via INTRAVENOUS

## 2021-07-02 MED ORDER — ALLOPURINOL 100 MG TAB
100 mg | Freq: Every day | ORAL | Status: AC
Start: 2021-07-02 — End: 2021-07-06
  Administered 2021-07-02: 16:00:00 via ORAL

## 2021-07-02 MED ORDER — PREDNISONE 20 MG TAB
20 mg | Freq: Every day | ORAL | Status: AC
Start: 2021-07-02 — End: 2021-07-06
  Administered 2021-07-02 – 2021-07-06 (×5): via ORAL

## 2021-07-02 MED ORDER — MORPHINE 4 MG/ML INTRAVENOUS SOLUTION
4 mg/mL | INTRAVENOUS | Status: DC | PRN
Start: 2021-07-02 — End: 2021-07-04
  Administered 2021-07-03: 14:00:00 via INTRAVENOUS

## 2021-07-02 MED ORDER — LORAZEPAM 2 MG/ML IJ SOLN
2 mg/mL | Freq: Once | INTRAMUSCULAR | Status: AC
Start: 2021-07-02 — End: 2021-07-02
  Administered 2021-07-02: 18:00:00 via INTRAVENOUS

## 2021-07-02 MED FILL — ISOSORBIDE MONONITRATE SR 30 MG 24 HR TAB: 30 mg | ORAL | Qty: 1

## 2021-07-02 MED FILL — METOPROLOL SUCCINATE SR 25 MG 24 HR TAB: 25 mg | ORAL | Qty: 1

## 2021-07-02 MED FILL — LEVOTHYROXINE 25 MCG TAB: 25 mcg | ORAL | Qty: 1

## 2021-07-02 MED FILL — CYCLOBENZAPRINE 10 MG TAB: 10 mg | ORAL | Qty: 1

## 2021-07-02 MED FILL — ENOXAPARIN 40 MG/0.4 ML SUB-Q SYRINGE: 40 mg/0.4 mL | SUBCUTANEOUS | Qty: 0.4

## 2021-07-02 MED FILL — OXYCODONE 5 MG TAB: 5 mg | ORAL | Qty: 2

## 2021-07-02 MED FILL — PREDNISONE 20 MG TAB: 20 mg | ORAL | Qty: 2

## 2021-07-02 MED FILL — ROSUVASTATIN 10 MG TAB: 10 mg | ORAL | Qty: 4

## 2021-07-02 MED FILL — LORAZEPAM 2 MG/ML IJ SOLN: 2 mg/mL | INTRAMUSCULAR | Qty: 1

## 2021-07-02 MED FILL — KETOROLAC TROMETHAMINE 30 MG/ML INJECTION: 30 mg/mL (1 mL) | INTRAMUSCULAR | Qty: 1

## 2021-07-02 MED FILL — DULOXETINE 20 MG CAP, DELAYED RELEASE: 20 mg | ORAL | Qty: 1

## 2021-07-02 MED FILL — ALLOPURINOL 100 MG TAB: 100 mg | ORAL | Qty: 1

## 2021-07-02 MED FILL — CLOPIDOGREL 75 MG TAB: 75 mg | ORAL | Qty: 1

## 2021-07-02 MED FILL — COLCRYS 0.6 MG TABLET: 0.6 mg | ORAL | Qty: 1

## 2021-07-02 NOTE — Progress Notes (Signed)
Patient received due medication and care as per protocol, rounded on and made comfortable. Handed over to oncoming shift in stable general condition, all safety measures observed.

## 2021-07-02 NOTE — Progress Notes (Signed)
Formatting of this note might be different from the original.   1915 Bedside shift change report given to [employer] (Cabin crew) by  Zollie Scale, RN (offgoing nurse). Report included the following information SBAR, Kardex, Intake/Output, and MAR.     0715 Bedside shift change report given to  RN (oncoming nurse) by Jamesetta So (offgoing nurse). Report included the following information SBAR, Kardex, Intake/Output, and MAR.     Electronically signed by Jarvis Newcomer, RN at 07/02/2021  6:53 AM EST

## 2021-07-02 NOTE — Progress Notes (Signed)
1915 Bedside shift change report given to [employer],RN (Cabin crew) by  Zollie Scale, RN (offgoing nurse). Report included the following information SBAR, Kardex, Intake/Output, and MAR.     0715 Bedside shift change report given to  RN (oncoming nurse) by Jamesetta So (offgoing nurse). Report included the following information SBAR, Kardex, Intake/Output, and MAR.

## 2021-07-02 NOTE — Progress Notes (Signed)
Formatting of this note might be different from the original.  Patient received due medication and care as per protocol, rounded on and made comfortable. Handed over to oncoming shift in stable general condition, all safety measures observed.   Electronically signed by Bud Face, RN at 07/02/2021  7:06 PM EST

## 2021-07-02 NOTE — Progress Notes (Signed)
TOC     RUR OBS high risk for readmit     IDR Rounds this am with MD and team  EDD undetermined- 24- 48 hours.   Talked to PT today- they assessed patient today - very little participation- pain impacted.     Patient recently moved here from [facility].     She has [employer] only-    Will need [place] Medicaid     To see how she does with PT and OT on next session.     For SNF placement will need Authorization from the insurance co.       To follow-up with patient and family     Velvet Mangum-Holmes MSW RN   [phone]

## 2021-07-02 NOTE — Progress Notes (Signed)
Formatting of this note is different from the original.    Problem: Mobility Impaired (Adult and Pediatric)  Goal: *Acute Goals and Plan of Care (Insert Text)  Description: FUNCTIONAL STATUS PRIOR TO ADMISSION: Patient was modified independent using a rolling walker for functional mobility.    HOME SUPPORT PRIOR TO ADMISSION: The patient lived alone with family to provide assistance.    Physical Therapy Goals  Initiated 07/02/2021  1.  Patient will move from supine to sit and sit to supine  in bed with minimal assistance/contact guard assist within 7 day(s).    2.  Patient will transfer from bed to chair and chair to bed with minimal assistance/contact guard assist using the least restrictive device within 7 day(s).  3.  Patient will perform sit to stand with minimal assistance/contact guard assist within 7 day(s).  4.  Patient will ambulate with minimal assistance/contact guard assist for 25 feet with the least restrictive device within 7 day(s).   5.  Patient will ascend/descend 3 stairs with unilateral handrail(s) with moderate assistance  within 7 day(s).   Outcome: Not Met   PHYSICAL THERAPY EVALUATION  Patient: Tanya [name] (80 y.o. female)  Date: 07/02/2021  Primary Diagnosis: Lumbago [M54.50]    Precautions: LLE pain       ASSESSMENT  Based on the objective data described below, the patient presents with decreased mobility and functional independence due to LLE pain. Patient presents well before baseline function, reporting that she is typically able to use rolling walker for community ambulation and independently negotiate 3 stairs to enter home. Patient received supine in bed with L hip in ER. Patient unable to actively move LLE in any direction without significant pain. Patient requires maxA to transfer supine to sit. Patient able to clear hips from bed with sit to stand attempt using rolling walker and maxA from PT. Patient unable to achieve full standing tor attempt ambulation a this visit. PT  recommends assist x2 for supine to sit and bed to chair transfers.     Current Level of Function Impacting Discharge (mobility/balance): mixA for supine to sit, patient unable to ambulate or achieve standing due to pain    Functional Outcome Measure:  The patient scored 3/56 on the Berg outcome measure which is indicative of high fall risk.      Other factors to consider for discharge: assistance level at home, benefits of consult to ortho/neuro    Patient will benefit from skilled therapy intervention to address the above noted impairments.       PLAN :  Recommendations and Planned Interventions: bed mobility training, transfer training, gait training, therapeutic exercises, neuromuscular re-education, patient and family training/education, and therapeutic activities      Frequency/Duration: Patient will be followed by physical therapy:  3 times a week to address goals.    Recommendation for discharge: (in order for the patient to meet his/her long term goals)  To be determined: based on ability to manage pain    This discharge recommendation:  Has been made in collaboration with the attending provider and/or case management    IF patient discharges home will need the following DME: patient owns DME required for discharge     SUBJECTIVE:   Patient stated ?Someone needs to tell me what's going on.?    OBJECTIVE DATA SUMMARY:   HISTORY:    Past Medical History:   Diagnosis Date    Acid reflux     Asthma     CAD (coronary artery  disease)     Hypertension      Past Surgical History:   Procedure Laterality Date    HX ORTHOPAEDIC      right knee replacement    PR CARDIAC SURG PROCEDURE UNLIST      Stents     Personal factors and/or comorbidities impacting plan of care:     Home Situation  Home Environment: Apartment  One/Two Story Residence: One story  Living Alone: No  Support Systems: Other Family Member(s)  Patient Expects to be Discharged to:: Home  Current DME Used/Available at Home: Environmental consultant, rollator, Medical laboratory scientific officer,  straight    EXAMINATION/PRESENTATION/DECISION MAKING:   Critical Behavior:  Neurologic State: Alert  Orientation Level: Oriented X4  Cognition: Follows commands    Range Of Motion:  AROM: Grossly decreased, non-functional (LLE, limited to pain)  PROM: Grossly decreased, non-functional (LLE, limited to pain)  Strength:    Strength: Grossly decreased, non-functional (LLE, limited to pain)    Functional Mobility:  Bed Mobility:  Rolling: Moderate assistance;Additional time;Bed Modified  Supine to Sit: Maximum assistance;Assist x1;Bed Modified;Additional time  Sit to Supine: Minimum assistance;Moderate assistance;Assist x1  Scooting: Moderate assistance;Assist x1;Additional time  Transfers:  Sit to Stand: Maximum assistance;Assist x1 (to clear hips from bed, unable to achieve erect posture)  Stand to Sit: Maximum assistance;Assist x1  Balance:   Sitting: Impaired;With support  Sitting - Static: Good (unsupported)  Sitting - Dynamic: Not tested  Standing: Impaired  Standing - Static: Not tested  Standing - Dynamic : Not tested    Functional Measure:  Berg Balance Test:    Sitting to Standing: 0  Standing Unsupported: 0  Sitting with Back Unsupported: 3  Standing to Sitting: 0  Transfers: 0  Standing Unsupported with Eyes Closed: 0  Standing Unsupported with Feet Together: 0  Reach Forward with Outstretched Arm: 0  Pick Up Object: 0  Turn to Look Over Shoulders: 0  Turn 360 Degrees: 0  Alternate Foot on Step/Stool: 0  Standing Unsupported One Foot in Front: 0  Stand on One Leg: 0  Total: 3/56     56=Maximum possible score;   0-20=High fall risk  21-40=Moderate fall risk   41-56=Low fall risk     Physical Therapy Evaluation Charge Determination   History Examination Presentation Decision-Making   LOW Complexity : Zero comorbidities / personal factors that will impact the outcome / POC LOW Complexity : 1-2 Standardized tests and measures addressing body structure, function, activity limitation and / or participation in  recreation  LOW Complexity : Stable, uncomplicated  Other outcome measures Berg  LOW      Based on the above components, the patient evaluation is determined to be of the following complexity level: LOW     Pain Rating:  10/10 LLE; RN aware    Activity Tolerance:   Poor    After treatment patient left in no apparent distress:   Supine in bed, Call bell within reach, and Side rails x 3    COMMUNICATION/EDUCATION:   The patient?s plan of care was discussed with: Physical therapist, Physician, and Case management.     Fall prevention education was provided and the patient/caregiver indicated understanding., Patient/family have participated as able in goal setting and plan of care., and Patient/family agree to work toward stated goals and plan of care.    Thank you for this referral.  Jani Gravel, PT   Time Calculation: 35 mins      Electronically signed by Jani Gravel, PT at 07/02/2021 12:49 PM  EST

## 2021-07-02 NOTE — Progress Notes (Signed)
Formatting of this note might be different from the original.  1920 Bedside shift change report given to Eucheria,RN (Cabin crew) by Hal Morales (offgoing nurse). Report included the following information SBAR, Kardex, Intake/Output, and MAR.      0740 Bedside shift change report given to Ashleen,LPN (oncoming nurse) by Jamesetta So (offgoing nurse). Report included the following information SBAR, Kardex, Intake/Output, and MAR.    Electronically signed by Jarvis Newcomer, RN at 07/03/2021  7:46 AM EST

## 2021-07-02 NOTE — Progress Notes (Signed)
Progress Notes by Danella Maiers, MD at 07/02/21 [phone]                Author: Danella Maiers, MD  Service: Hospitalist  Author Type: Physician       Filed: 07/02/21 1654  Date of Service: 07/02/21 0950  Status: Addendum          Editor: Danella Maiers, MD (Physician)          Related Notes: Original Note by Danella Maiers, MD (Physician) filed at 07/02/21 1147                         Hospitalist Progress Note      NAME: Tanya [name]    DOB:  06/05/1941    MRN:  [id number]       Room Number:   212/01  @ [facility]        Please note that this dictation was completed with Reubin Milan, the computer voice recognition software.  Quite often unanticipated grammatical, syntax, homophones, and other interpretive  errors are inadvertently transcribed by the computer software.  Please disregard these errors.  Please excuse any errors that have escaped final proofreading.        Interim Hospital Summary: 80 y.o. 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  Moderate bilateral L3-L4 and right L4-L5 neuronal foraminal stenosis    -CT lumbar spine multilevel lumbar spondylosis.  No fracture   -CT pelvis without any fracture or bone lesion   -X-ray hip with degenerative joint disease and osteopenia   -Patient states she was offered back surgery but could not get clearance.     -X-ray knee with joint effusion.  No fever or chills or white count does not suspect septic arthritis   -At home on allopurinol and colchicine   -Uric acid level 5.5           -PT OT   -Pain management   -MRI L spine to rule out nerve impingement/herniated disc       -Colchicine and prednisone course for acute gout attack   -Not a candidate for NSAID due to CKD           #History of CAD status post stent to RCA 2003   #History of hypothyroidism   #History of hypertension   #History of CKD 3B   #History of gout   #History of hyperlipidemia   #History of COPD   #History of arthritis   #History of stroke   -Resume home regimen once confirmed by pharmacy       Body mass index is 39.06 kg/m??.   Code Status: DNR   Surrogate Decision Maker:       DVT Prophylaxis: Lovenox                   Subjective:        Chief Complaint / Reason for Physician Visit   "Pain  in right leg and right knee".  Discussed with RN events overnight.       Review of Systems:   No fevers, chills, appetite change, cough, sputum production, shortness of breath, dyspnea on exertion, nausea, vomitting, diarrhea, constipation, chest pain, leg edema, abdominal pain, , rash, itching.    Tolerating PT/OT. Tolerating diet.            Objective:        VITALS:    Last 24hrs VS reviewed since prior progress note. Most recent are:   Patient Vitals for the past 24 hrs:            Temp  Pulse  Resp  BP  SpO2            07/02/21 0732  98.7 ??F (37.1 ??C)  94  18  (!) 155/81  97 %            07/02/21 0436  98.4 ??F (36.9 ??C)  92  20  (!) 159/66  94 %     07/02/21 0008  98.6 ??F (37 ??C)  88  18  (!) 165/73  94 %     07/01/21 2310  99.2 ??F (37.3 ??C)  --  --  --  94 %     07/01/21 2308  99.3 ??F (37.4 ??C)  91  20  (!) 188/93  92 %     07/01/21 1942  97.2 ??F (36.2 ??C)  87  18  (!) 165/117  98 %     07/01/21 1659  98.5 ??F (36.9 ??C)  86  18  (!) 177/93  99 %     07/01/21 1625  --  83  15  (!) 166/76  98 %     07/01/21 1233  --  78  --  (!) 193/94  99 %            07/01/21 1145  --  85  18  (!) 170/86  95 %        No intake or output data in the 24 hours ending 07/02/21 1143       PHYSICAL EXAM:   General: WD, WN. Alert, cooperative, no acute distress     EENT:  EOMI. Anicteric sclerae. MMM   Resp:  CTA bilaterally, no wheezing or  rales.  No accessory muscle use   CV:  Regular  rhythm,  normal S1/S2, no murmurs rubs gallops, No edema   GI:  Soft, Non distended, Non tender.  +Bowel sounds   Neurologic:  Alert and oriented X 3, normal speech,    Psych:   Good insight. Not anxious nor agitated   Skin:  No rashes.  No jaundice   -Right knee tender to touch      Reviewed most current lab test results and cultures  YES   Reviewed most current radiology test results   YES   Review and summation of old records today    NO   Reviewed patient's current orders and MAR    YES   PMH/SH reviewed - no change compared to H&P   ________________________________________________________________________   Care Plan discussed with:           Comments         Patient  x           Family              RN  x       Care Manager  x  Consultant                                x  Multidiciplinary team rounds were held today with case manager, nursing, pharmacist and Higher education careers adviser.  Patient's plan of care was discussed; medications  were reviewed and discharge planning was addressed.       ________________________________________________________________________   Total NON critical care TIME:  35   Minutes      Total CRITICAL CARE TIME Spent:   Minutes non procedure based              Comments         >50% of visit spent in counseling and coordination of care  x       ________________________________________________________________________   Danella Maiers, MD       Procedures: see electronic medical records for all procedures/Xrays and details which were not copied into this note but were reviewed prior to creation of Plan.        LABS:   I reviewed today's most current labs and imaging studies.   Pertinent labs include:     Recent Labs           07/01/21   1051     WBC  8.0     HGB  14.0     HCT  41.2        PLT  227          Recent Labs           07/01/21   1051     NA  140     K  3.7     CL  102     CO2  29     GLU  121*     BUN  13     CREA  1.34*      CA  9.2     ALB  3.6     TBILI  0.7        ALT  15           Signed: Danella Maiers, MD

## 2021-07-02 NOTE — Progress Notes (Signed)
1920 Bedside shift change report given to Eucheria,RN (Cabin crew) by Hal Morales (offgoing nurse). Report included the following information SBAR, Kardex, Intake/Output, and MAR.      0740 Bedside shift change report given to Ashleen,LPN (oncoming nurse) by Jamesetta So (offgoing nurse). Report included the following information SBAR, Kardex, Intake/Output, and MAR.

## 2021-07-02 NOTE — Progress Notes (Signed)
Problem: Mobility Impaired (Adult and Pediatric)  Goal: *Acute Goals and Plan of Care (Insert Text)  Description: FUNCTIONAL STATUS PRIOR TO ADMISSION: Patient was modified independent using a rolling walker for functional mobility.    HOME SUPPORT PRIOR TO ADMISSION: The patient lived alone with family to provide assistance.    Physical Therapy Goals  Initiated 07/02/2021  1.  Patient will move from supine to sit and sit to supine  in bed with minimal assistance/contact guard assist within 7 day(s).    2.  Patient will transfer from bed to chair and chair to bed with minimal assistance/contact guard assist using the least restrictive device within 7 day(s).  3.  Patient will perform sit to stand with minimal assistance/contact guard assist within 7 day(s).  4.  Patient will ambulate with minimal assistance/contact guard assist for 25 feet with the least restrictive device within 7 day(s).   5.  Patient will ascend/descend 3 stairs with unilateral handrail(s) with moderate assistance  within 7 day(s).   Outcome: Not Met   PHYSICAL THERAPY EVALUATION  Patient: Tanya [name] (80 y.o. female)  Date: 07/02/2021  Primary Diagnosis: Lumbago [M54.50]       Precautions: LLE pain       ASSESSMENT  Based on the objective data described below, the patient presents with decreased mobility and functional independence due to LLE pain. Patient presents well before baseline function, reporting that she is typically able to use rolling walker for community ambulation and independently negotiate 3 stairs to enter home. Patient received supine in bed with L hip in ER. Patient unable to actively move LLE in any direction without significant pain. Patient requires maxA to transfer supine to sit. Patient able to clear hips from bed with sit to stand attempt using rolling walker and maxA from PT. Patient unable to achieve full standing tor attempt ambulation a this visit. PT recommends assist x2 for supine to sit and bed to chair  transfers.     Current Level of Function Impacting Discharge (mobility/balance): mixA for supine to sit, patient unable to ambulate or achieve standing due to pain    Functional Outcome Measure:  The patient scored 3/56 on the Berg outcome measure which is indicative of high fall risk.      Other factors to consider for discharge: assistance level at home, benefits of consult to ortho/neuro     Patient will benefit from skilled therapy intervention to address the above noted impairments.       PLAN :  Recommendations and Planned Interventions: bed mobility training, transfer training, gait training, therapeutic exercises, neuromuscular re-education, patient and family training/education, and therapeutic activities      Frequency/Duration: Patient will be followed by physical therapy:  3 times a week to address goals.    Recommendation for discharge: (in order for the patient to meet his/her long term goals)  To be determined: based on ability to manage pain    This discharge recommendation:  Has been made in collaboration with the attending provider and/or case management    IF patient discharges home will need the following DME: patient owns DME required for discharge     SUBJECTIVE:   Patient stated ???Someone needs to tell me what's going on.???    OBJECTIVE DATA SUMMARY:   HISTORY:    Past Medical History:   Diagnosis Date    Acid reflux     Asthma     CAD (coronary artery disease)     Hypertension  Past Surgical History:   Procedure Laterality Date    HX ORTHOPAEDIC      right knee replacement    PR CARDIAC SURG PROCEDURE UNLIST      Stents     Personal factors and/or comorbidities impacting plan of care:     Home Situation  Home Environment: Apartment  One/Two Story Residence: One story  Living Alone: No  Support Systems: Other Family Member(s)  Patient Expects to be Discharged to:: Home  Current DME Used/Available at Home: Environmental consultant, rollator, Radio producer, straight    EXAMINATION/PRESENTATION/DECISION MAKING:    Critical Behavior:  Neurologic State: Alert  Orientation Level: Oriented X4  Cognition: Follows commands    Range Of Motion:  AROM: Grossly decreased, non-functional (LLE, limited to pain)  PROM: Grossly decreased, non-functional (LLE, limited to pain)  Strength:    Strength: Grossly decreased, non-functional (LLE, limited to pain)    Functional Mobility:  Bed Mobility:  Rolling: Moderate assistance;Additional time;Bed Modified  Supine to Sit: Maximum assistance;Assist x1;Bed Modified;Additional time  Sit to Supine: Minimum assistance;Moderate assistance;Assist x1  Scooting: Moderate assistance;Assist x1;Additional time  Transfers:  Sit to Stand: Maximum assistance;Assist x1 (to clear hips from bed, unable to achieve erect posture)  Stand to Sit: Maximum assistance;Assist x1  Balance:   Sitting: Impaired;With support  Sitting - Static: Good (unsupported)  Sitting - Dynamic: Not tested  Standing: Impaired  Standing - Static: Not tested  Standing - Dynamic : Not tested    Functional Measure:  Berg Balance Test:    Sitting to Standing: 0  Standing Unsupported: 0  Sitting with Back Unsupported: 3  Standing to Sitting: 0  Transfers: 0  Standing Unsupported with Eyes Closed: 0  Standing Unsupported with Feet Together: 0  Reach Forward with Outstretched Arm: 0  Pick Up Object: 0  Turn to Look Over Shoulders: 0  Turn 360 Degrees: 0  Alternate Foot on Step/Stool: 0  Standing Unsupported One Foot in Front: 0  Stand on One Leg: 0  Total: 3/56     56=Maximum possible score;   0-20=High fall risk  21-40=Moderate fall risk   41-56=Low fall risk     Physical Therapy Evaluation Charge Determination   History Examination Presentation Decision-Making   LOW Complexity : Zero comorbidities / personal factors that will impact the outcome / POC LOW Complexity : 1-2 Standardized tests and measures addressing body structure, function, activity limitation and / or participation in recreation  LOW Complexity : Stable, uncomplicated  Other  outcome measures Berg  LOW       Based on the above components, the patient evaluation is determined to be of the following complexity level: LOW     Pain Rating:  10/10 LLE; RN aware    Activity Tolerance:   Poor    After treatment patient left in no apparent distress:   Supine in bed, Call bell within reach, and Side rails x 3    COMMUNICATION/EDUCATION:   The patient???s plan of care was discussed with: Physical therapist, Physician, and Case management.     Fall prevention education was provided and the patient/caregiver indicated understanding., Patient/family have participated as able in goal setting and plan of care., and Patient/family agree to work toward stated goals and plan of care.    Thank you for this referral.  Cherie Ouch, PT   Time Calculation: 35 mins

## 2021-07-02 NOTE — Progress Notes (Signed)
Formatting of this note might be different from the original.  TOC     RUR OBS high risk for readmit     IDR Rounds this am with MD and team  EDD undetermined- 24- 48 hours.   Talked to PT today- they assessed patient today - very little participation- pain impacted.     Patient recently moved here from [facility].     She has [employer] only-    Will need [employer]     To see how she does with PT and OT on next session.     For SNF placement will need Authorization from the insurance co.     To follow-up with patient and family     Velvet Mangum-Holmes MSW RN   [phone]   Electronically signed by Gigi Gin, RN at 07/02/2021  6:42 PM EST

## 2021-07-02 NOTE — Progress Notes (Signed)
Formatting of this note is different from the original.  Images from the original note were not included.      Hospitalist Progress Note    NAME: Tanya [name]   DOB:  10-24-1940   MRN:  [id number]   Room Number:  212/01  @ [facility]    Please note that this dictation was completed with Reubin Milan, the computer voice recognition software.  Quite often unanticipated grammatical, syntax, homophones, and other interpretive errors are inadvertently transcribed by the computer software.  Please disregard these errors.  Please excuse any errors that have escaped final proofreading.    Interim Hospital Summary: 80 y.o. 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  Moderate bilateral L3-L4 and right L4-L5 neuronal foraminal stenosis   -CT lumbar spine multilevel lumbar spondylosis.  No fracture  -CT pelvis without any fracture or bone lesion  -X-ray hip with degenerative joint disease and osteopenia  -Patient states she was offered back surgery but could not get clearance.    -X-ray knee with joint effusion.  No fever or chills or white count does not suspect septic arthritis  -At home on allopurinol and colchicine  -Uric acid level 5.5      -PT OT  -Pain management  -MRI L spine to rule out nerve impingement/herniated disc    -Colchicine and prednisone course for acute gout attack  -Not a candidate for NSAID due to CKD      #History of CAD status post stent to RCA 2003  #History of hypothyroidism  #History of hypertension  #History of CKD 3B  #History of gout  #History of hyperlipidemia  #History of COPD  #History of arthritis  #History of stroke  -Resume  home regimen once confirmed by pharmacy    Body mass index is 39.06 kg/m.  Code Status: DNR  Surrogate Decision Maker:    DVT Prophylaxis: Lovenox        Subjective:     Chief Complaint / Reason for Physician Visit  "Pain in right leg and right knee".  Discussed with RN events overnight.     Review of Systems:  No fevers, chills, appetite change, cough, sputum production, shortness of breath, dyspnea on exertion, nausea, vomitting, diarrhea, constipation, chest pain, leg edema, abdominal pain, , rash, itching.   Tolerating PT/OT. Tolerating diet.     Objective:     VITALS:   Last 24hrs VS reviewed since prior progress note. Most recent are:  Patient Vitals for the past 24 hrs:   Temp Pulse Resp BP SpO2   07/02/21 0732 98.7 F (37.1 C) 94 18 (!) 155/81 97 %   07/02/21 0436 98.4 F (36.9 C) 92 20 (!) 159/66 94 %   07/02/21 0008 98.6 F (37 C) 88 18 (!) 165/73 94 %   07/01/21 2310 99.2 F (37.3 C) -- -- -- 94 %   07/01/21 2308 99.3 F (37.4 C) 91 20 (!) 188/93 92 %   07/01/21 1942 97.2 F (36.2 C) 87 18 (!) 165/117 98 %   07/01/21 1659 98.5 F (36.9 C) 86  18 (!) 177/93 99 %   07/01/21 1625 -- 83 15 (!) 166/76 98 %   07/01/21 1233 -- 78 -- (!) 193/94 99 %   07/01/21 1145 -- 85 18 (!) 170/86 95 %     No intake or output data in the 24 hours ending 07/02/21 1143     PHYSICAL EXAM:  General: WD, WN. Alert, cooperative, no acute distress    EENT:  EOMI. Anicteric sclerae. MMM  Resp:  CTA bilaterally, no wheezing or rales.  No accessory muscle use  CV:  Regular  rhythm,  normal S1/S2, no murmurs rubs gallops, No edema  GI:  Soft, Non distended, Non tender.  +Bowel sounds  Neurologic:  Alert and oriented X 3, normal speech,   Psych:   Good insight. Not anxious nor agitated  Skin:  No rashes.  No jaundice  -Right knee tender to touch    Reviewed most current lab test results and cultures  YES  Reviewed most current radiology test results   YES  Review and summation of old records today    NO  Reviewed patient's current  orders and MAR    YES  PMH/SH reviewed - no change compared to H&P  ________________________________________________________________________  Care Plan discussed with:    Comments   Patient x    Family      RN x    Care Manager x    Consultant                       x Multidiciplinary team rounds were held today with case manager, nursing, pharmacist and clinical coordinator.  Patient's plan of care was discussed; medications were reviewed and discharge planning was addressed.     ________________________________________________________________________  Total NON critical care TIME:  35   Minutes    Total CRITICAL CARE TIME Spent:   Minutes non procedure based      Comments   >50% of visit spent in counseling and coordination of care x    ________________________________________________________________________  Danella Maiers, MD     Procedures: see electronic medical records for all procedures/Xrays and details which were not copied into this note but were reviewed prior to creation of Plan.      LABS:  I reviewed today's most current labs and imaging studies.  Pertinent labs include:  Recent Labs     07/01/21  1051   WBC 8.0   HGB 14.0   HCT 41.2   PLT 227     Recent Labs     07/01/21  1051   NA 140   K 3.7   CL 102   CO2 29   GLU 121*   BUN 13   CREA 1.34*   CA 9.2   ALB 3.6   TBILI 0.7   ALT 15     Signed: Danella Maiers, MD      Electronically signed by Danella Maiers, MD at 07/02/2021  4:54 PM EST

## 2021-07-03 MED ORDER — IPRATROPIUM-ALBUTEROL 2.5 MG-0.5 MG/3 ML NEB SOLUTION
2.5-0.53 mg-0.5 mg/3 ml | Freq: Four times a day (QID) | RESPIRATORY_TRACT | Status: DC | PRN
Start: 2021-07-03 — End: 2021-07-06

## 2021-07-03 MED ORDER — DICLOFENAC 1 % TOPICAL GEL
1 % | Freq: Four times a day (QID) | CUTANEOUS | Status: DC
Start: 2021-07-03 — End: 2021-07-06
  Administered 2021-07-03 – 2021-07-06 (×11): via TOPICAL

## 2021-07-03 MED ORDER — DOCUSATE SODIUM 100 MG CAP
100 mg | Freq: Two times a day (BID) | ORAL | Status: AC
Start: 2021-07-03 — End: 2021-07-06
  Administered 2021-07-03 – 2021-07-04 (×3): via ORAL

## 2021-07-03 MED FILL — PREDNISONE 20 MG TAB: 20 mg | ORAL | Qty: 2

## 2021-07-03 MED FILL — DICLOFENAC 1 % TOPICAL GEL: 1 % | CUTANEOUS | Qty: 1

## 2021-07-03 MED FILL — ENOXAPARIN 40 MG/0.4 ML SUB-Q SYRINGE: 40 mg/0.4 mL | SUBCUTANEOUS | Qty: 0.4

## 2021-07-03 MED FILL — ACETAMINOPHEN 325 MG TABLET: 325 mg | ORAL | Qty: 2

## 2021-07-03 MED FILL — DULOXETINE 20 MG CAP, DELAYED RELEASE: 20 mg | ORAL | Qty: 1

## 2021-07-03 MED FILL — ISOSORBIDE MONONITRATE SR 30 MG 24 HR TAB: 30 mg | ORAL | Qty: 1

## 2021-07-03 MED FILL — MORPHINE 4 MG/ML SYRINGE: 4 mg/mL | INTRAMUSCULAR | Qty: 1

## 2021-07-03 MED FILL — ROSUVASTATIN 10 MG TAB: 10 mg | ORAL | Qty: 4

## 2021-07-03 MED FILL — COLCRYS 0.6 MG TABLET: 0.6 mg | ORAL | Qty: 1

## 2021-07-03 MED FILL — CLOPIDOGREL 75 MG TAB: 75 mg | ORAL | Qty: 1

## 2021-07-03 MED FILL — DOCUSATE SODIUM 100 MG CAP: 100 mg | ORAL | Qty: 1

## 2021-07-03 MED FILL — LEVOTHYROXINE 25 MCG TAB: 25 mcg | ORAL | Qty: 1

## 2021-07-03 MED FILL — METOPROLOL SUCCINATE SR 25 MG 24 HR TAB: 25 mg | ORAL | Qty: 1

## 2021-07-03 NOTE — Progress Notes (Signed)
Report given to night shift Nurse Cheryl, RN.  Report included SBAR, MAR, KARDEX, LAB RESULTS, INTAKE/OUT, CARDIAC RHYTHM and all care given during shift.

## 2021-07-03 NOTE — Progress Notes (Signed)
Formatting of this note is different from the original.  Images from the original note were not included.      [facility]               Hospitalist Progress Note  Tanya CarneElena Yuryevna Atchikova, MD  Answering service: [phone] OR [phone] from in house phone      Date of Service:  07/03/2021  NAME:  Tanya [name]  DOB:  1941-01-19  MRN:  [id number]  Room: [facility]    Admission Summary:   From HPI: Tanya RadonRuth [name] is a 80 y.o.  African American female who presents to ED with c/o left leg pain and left knee pain for past couple of days.  Patient states that she was in her usual state of health when she had swelling of her left knee followed by severe pain in her left leg to the point that she cannot ambulate without assistance.  Patient states that she has balance noted and [place]  She could not going to surgery given given her cardiac condition.  Endorsed tobacco use denied any excessive alcohol use or recreational drug use.  Denied any chest pain shortness of breath belly pain difficulty with bowel or bladder      Interval history / Subjective:   + pain: back and left knee  Unable to ambulate due to pain and weakness  No acute events over night per RN     Assessment & Plan:     Anticipated discharge date : TBD  Anticipated disposition : SNF  Barriers to discharge : pain control       Left leg pain, acute POA  Left knee pain and swelling, acute POA  Suspected acute on chronic gout attack POA  Possible acute on chronic pain related to DJD  Left knee infusion  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  Moderate bilateral L3-L4 and right L4-L5 neuronal foraminal stenosis   -CT lumbar spine multilevel lumbar spondylosis.  No fracture  -CT pelvis without any fracture or bone lesion  -X-ray hip with degenerative joint disease and osteopenia  -Patient states she was offered back  surgery but could not get clearance.    -X-ray knee with joint effusion.  No fever or chills or white count does not suspect septic arthritis  -At home on allopurinol and colchicine  -Uric acid level 5.5    Continue:   -PT OT  -Pain management  -MRI L spine to rule out nerve impingement/herniated disc   -Colchicine and prednisone course for acute gout attack  -Not a candidate for NSAID due to CKD  Add Voltaren topical     History of CAD status post stent to RCA 2003  History of hypothyroidism  History of hypertension  History of CKD 3B  History of gout  History of hyperlipidemia  History of COPD  History of arthritis  History of stroke  -Resume home regimen once confirmed by pharmacy    Constipation  Add colace  Miralax PRN    Body mass index is 39.06 kg/m.  Code Status: DNR  DVT Prophylaxis: Lovenox  Care Plan discussed with: patient and RN  Anticipated Disposition: TBD, pending clinical improvement      Hospital Problems  Date Reviewed: 11/28/2012            Codes Class Noted POA  Pain of left lower extremity ICD-10-CM: M79.605  ICD-9-CM: 729.5  07/02/2021 Unknown      Acute idiopathic gout of left knee ICD-10-CM: M10.062  ICD-9-CM: 274.01  07/02/2021 Unknown      Spinal stenosis of lumbar region without neurogenic claudication ICD-10-CM: M48.061  ICD-9-CM: 724.02  07/02/2021 Unknown       Review of Systems:   A comprehensive review of systems was negative except for that written in the HPI.     Vital Signs:    Last 24hrs VS reviewed since prior progress note. Most recent are:  Visit Vitals  BP 115/67 (BP 1 Location: Right upper arm, BP Patient Position: At rest)   Pulse 77   Temp 97.7 F (36.5 C)   Resp 16   Ht 5' (1.524 m)   Wt 90.7 kg (200 lb)   SpO2 94%   BMI 39.06 kg/m     Intake/Output Summary (Last 24 hours) at 07/03/2021 1337  Last data filed at 07/03/2021 0630  Gross per 24 hour   Intake --   Output 380 ml   Net -380 ml       Physical Examination:     I had a face to face encounter with this patient  and independently examined them on 07/03/2021 as outlined below:      Constitutional:  No acute distress, cooperative, pleasant, + pain back and left knee with movement   ENT:  Oral mucosa moist, oropharynx benign.    Resp:  CTA bilaterally. No wheezing/rhonchi/rales. No accessory muscle use.    CV:  Regular rhythm, normal rate, no gallops, rubs    GI:  Soft, non distended, non tender. normoactive bowel sounds, no hepatosplenomegaly     Musculoskeletal:  No edema, warm, 2+ pulses throughout    Neurologic:  Decreased ROM LLE, left knee.  AAOx3, CN II-XII reviewed, left knee edema but no redness         Data Review:    Review and/or order of clinical lab test    MRI spine:  FINDINGS:    There is 4.6 mm anterolisthesis of L4 relative to L5 without associated  spondylolysis. Vertebral body heights are maintained. Marrow signal is mildly  heterogeneous without a discrete lesion.    The conus medullaris terminates at L1. Signal and caliber of the distal spinal  cord are within normal limits.     The paraspinal soft tissues are within normal limits.    Lower thoracic spine: Disc bulges at T10-11, T11-12, and T12-L1 No herniation or  stenosis.    L1-L2: No herniation or stenosis.    L2-L3: Mild disc bulge, in combination with the bilateral facet arthropathy and  ligamentum flavum hypertrophy, results in central canal diameter of 7.5 mm.  There is mild bilateral neural foraminal stenosis (series 5, image 8).    L3-L4: Diffuse disc bulge, in combination with the bilateral facet arthropathy  and ligamentum flavum hypertrophy. The central canal measures 5.7 mm anterior to  posterior. Moderate bilateral neural foraminal stenosis (series 5, image 11).    L4-L5: Broad disc bulge, in combination with the moderate bilateral ligamentum  flavum hypertrophy and severe bilateral facet arthropathy. The central canal is  triangulated to about 2 to 3 mm (series 5, image 14). Moderate right and  mild-to-moderate left neural foraminal  stenosis.    L5-S1: Broad disc bulge. Bilateral facet arthropathy and ligamentum flavum  hypertrophy. No significant central canal stenosis. Mild bilateral neural  foraminal stenosis. (Series 5, image 17).  IMPRESSION  Spinal stenosis, Critical at L4-5, severe at L3-4, and moderate at L2-3.  Moderate bilateral L3-4 and right L4-5 neural foraminal stenosis  Grade 1 anterolisthesis of L4 on a degenerative basis    XR left knee:  FINDINGS: 4 of the left knee demonstrate no fracture or other acute osseous or  articular abnormality. There is a joint effusion. There is moderate  tricompartmental DJD. Vascular calcifications are noted.    IMPRESSION  Joint effusion. DJD. No evidence of fracture.    Labs:     Recent Labs     07/01/21  1051   WBC 8.0   HGB 14.0   HCT 41.2   PLT 227     Recent Labs     07/02/21  1525 07/01/21  1051   NA  --  140   K  --  3.7   CL  --  102   CO2  --  29   BUN  --  13   CREA  --  1.34*   GLU  --  121*   CA  --  9.2   URICA 5.5  --      Recent Labs     07/01/21  1051   ALT 15   AP 105   TBILI 0.7   TP 7.9   ALB 3.6   GLOB 4.3*     No results for input(s): INR, PTP, APTT, INREXT in the last 72 hours.   No results for input(s): FE, TIBC, PSAT, FERR in the last 72 hours.   No results found for: FOL, RBCF   No results for input(s): PH, PCO2, PO2 in the last 72 hours.  No results for input(s): CPK, CKNDX, TROIQ in the last 72 hours.    No lab exists for component: CPKMB  Lab Results   Component Value Date/Time    Cholesterol, total 162 11/27/2012 01:35 AM    HDL Cholesterol 59 11/27/2012 01:35 AM    LDL, calculated 74.4 11/27/2012 01:35 AM    Triglyceride 143 11/27/2012 01:35 AM    CHOL/HDL Ratio 2.7 11/27/2012 01:35 AM     No results found for: [facility]  Lab Results   Component Value Date/Time    Color YELLOW/STRAW 07/01/2021 01:30 PM    Appearance CLEAR 07/01/2021 01:30 PM    Specific gravity 1.010 07/01/2021 01:30 PM    pH (UA) 6.5 07/01/2021 01:30 PM    Protein TRACE (A) 07/01/2021 01:30 PM     Glucose Negative 07/01/2021 01:30 PM    Ketone Negative 07/01/2021 01:30 PM    Bilirubin Negative 07/01/2021 01:30 PM    Urobilinogen 1.0 07/01/2021 01:30 PM    Nitrites Negative 07/01/2021 01:30 PM    Leukocyte Esterase Negative 07/01/2021 01:30 PM    Epithelial cells FEW 07/01/2021 01:30 PM    Bacteria Negative 07/01/2021 01:30 PM    WBC 0-4 07/01/2021 01:30 PM    RBC 0-5 07/01/2021 01:30 PM     Medications Reviewed:     Current Facility-Administered Medications   Medication Dose Route Frequency    docusate sodium (COLACE) capsule 100 mg  100 mg Oral BID    hydrALAZINE (APRESOLINE) 20 mg/mL injection 10 mg  10 mg IntraVENous Q6H PRN    DULoxetine (CYMBALTA) capsule 20 mg  20 mg Oral DAILY    [Held by provider] allopurinoL (ZYLOPRIM) tablet 100 mg  100 mg Oral DAILY    colchicine tablet 0.6 mg  0.6 mg Oral DAILY    predniSONE (DELTASONE)  tablet 40 mg  40 mg Oral DAILY WITH BREAKFAST    morphine injection 4 mg  4 mg IntraVENous Q3H PRN    sodium chloride (NS) flush 5-40 mL  5-40 mL IntraVENous Q8H    sodium chloride (NS) flush 5-40 mL  5-40 mL IntraVENous PRN    acetaminophen (TYLENOL) tablet 650 mg  650 mg Oral Q6H PRN    Or    acetaminophen (TYLENOL) suppository 650 mg  650 mg Rectal Q6H PRN    polyethylene glycol (MIRALAX) packet 17 g  17 g Oral DAILY PRN    ondansetron (ZOFRAN ODT) tablet 4 mg  4 mg Oral Q8H PRN    Or    ondansetron (ZOFRAN) injection 4 mg  4 mg IntraVENous Q6H PRN    enoxaparin (LOVENOX) injection 40 mg  40 mg SubCUTAneous DAILY    clopidogreL (PLAVIX) tablet 75 mg  75 mg Oral DAILY    isosorbide mononitrate ER (IMDUR) tablet 30 mg  30 mg Oral DAILY    rosuvastatin (CRESTOR) tablet 40 mg  40 mg Oral QHS    naloxone (NARCAN) injection 0.4 mg  0.4 mg IntraVENous PRN    cyclobenzaprine (FLEXERIL) tablet 10 mg  10 mg Oral TID PRN    metoprolol succinate (TOPROL-XL) XL tablet 25 mg  25 mg Oral DAILY    levothyroxine (SYNTHROID) tablet 75 mcg  75 mcg Oral 6am      ______________________________________________________________________  EXPECTED LENGTH OF STAY: - - -  ACTUAL LENGTH OF STAY:          0      Tanya Tretha SciaraYuryevna Atchikova, MD    Electronically signed by Tanya CarneAtchikova, Tanya Yuryevna, MD at 07/03/2021  4:20 PM EST

## 2021-07-03 NOTE — Progress Notes (Signed)
Formatting of this note might be different from the original.  Report given to night shift Nurse Elnita Maxwellheryl, Charity fundraiserN.  Report included SBAR, MAR, KARDEX, LAB RESULTS, INTAKE/OUT, CARDIAC RHYTHM and all care given during shift.  Electronically signed by Benjamine SpragueWeston, Ashleen, LPN at 96/04/540912/31/2022  7:36 PM EST

## 2021-07-03 NOTE — Progress Notes (Signed)
Formatting of this note might be different from the original.  PerfectServe to Dr. Rica MastAtchikova: patient's IV dislodged from site. Would you like another IV placement? Her IV meds are PRN Hydralazine and PRN Morphine.    Dr. Rica MastAtchikova response: need pain meds IV.    Plan: attempt another IV. [facility].    PerfectServe to Dr. Rica MastAtchikova: Patient is on telemetry which shows a bundle branch block. Unable to find EKG from this hospitalization. Reviewed EKG from March 2021 which shows a left bundle branch block. Just an FYI.    PerfectServe to covering physician, Dr. Ellison HughsNizam: FYI EKG completed as ordered by Dr. Rica MastAtchikova, shows left bundle branch block. This is consistent with the EKG done in March 2021. Please review.    Electronically signed by Benjamine SpragueWeston, Ashleen, LPN at 16/10/960412/31/2022  7:06 PM EST

## 2021-07-03 NOTE — Progress Notes (Signed)
PerfectServe to Dr. Rica Mast: patient's IV dislodged from site. Would you like another IV placement? Her IV meds are PRN Hydralazine and PRN Morphine.    Dr. Rica Mast response: need pain meds IV.    Plan: attempt another IV. IV placed in right [facility] by ED nurse Natalia.    PerfectServe to Dr. Rica Mast: Patient is on telemetry which shows a bundle branch block. Unable to find EKG from this hospitalization. Reviewed EKG from March 2021 which shows a left bundle branch block. Just an FYI.    PerfectServe to covering physician, Dr. Ellison Hughs: FYI EKG completed as ordered by Dr. Rica Mast, shows left bundle branch block. This is consistent with the EKG done in March 2021. Please review.

## 2021-07-03 NOTE — Progress Notes (Signed)
Formatting of this note might be different from the original.  Report taken from night shift Nurse Rowland LatheEucharia, RN.  Report included SBAR, MAR, KARDEX, LAB RESULTS, INTAKE/OUT, CARDIAC RHYTHM and all care given during shift.  Electronically signed by Benjamine SpragueWeston, Ashleen, LPN at 16/10/960412/31/2022 11:55 AM EST

## 2021-07-03 NOTE — Progress Notes (Signed)
Progress Notes by Ginger Carne, MD at 07/03/21 1337                Author: Ginger Carne, MD  Service: Hospitalist  Author Type: Physician       Filed: 07/03/21 1620  Date of Service: 07/03/21 1337  Status: Addendum          Editor: Ginger Carne, MD (Physician)          Related Notes: Original Note by Ginger Carne, MD (Physician) filed at 07/03/21  1354                             Sondra Barges [facility] Adult  Hospitalist Group                                                                                              Hospitalist Progress Note   Ginger Carne, MD   Answering service: [phone] OR [phone] from in house phone            Date of Service:  07/03/2021   NAME:  Tanya [name]   DOB:  21-Jan-1941   MRN:  [id number]   Room: [facility] 212        Admission Summary:        From HPI: Tanya [name] is a 80 y.o.  African American female who presents to ED with c/o left  leg pain and left knee pain for past couple of days.  Patient states that she was in her usual state of health when she had swelling of her left knee followed by severe pain in her left leg to the point that she cannot ambulate without assistance.  Patient  states that she has balance noted and within the back surgeon in [place].  She could not going to surgery given given her cardiac condition.  Endorsed tobacco use denied any excessive alcohol use or recreational drug use.  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  Moderate bilateral L3-L4 and right L4-L5 neuronal foraminal stenosis    -CT lumbar spine multilevel  lumbar spondylosis.  No fracture   -CT pelvis without any fracture or bone lesion   -X-ray hip with degenerative joint disease and osteopenia   -Patient states she was offered back surgery but could not get clearance.     -X-ray knee with joint effusion.  No fever or chills or white count does not suspect septic arthritis   -At home on allopurinol and colchicine   -Uric acid level 5.5      Continue:    -PT OT   -Pain management   -MRI L spine to rule out nerve impingement/herniated disc    -Colchicine and prednisone course for acute gout attack   -Not a candidate for NSAID due to CKD   Add Voltaren topical        History of CAD status post stent to RCA 2003   History of hypothyroidism   History of hypertension   History of CKD 3B   History of gout   History of hyperlipidemia   History of COPD   History of arthritis   History of stroke   -Resume home regimen once confirmed by pharmacy      Constipation   Add colace   Miralax PRN       Body mass index is 39.06 kg/m??.   Code Status: DNR   DVT Prophylaxis: Lovenox   Care Plan discussed with: patient and RN   Anticipated Disposition: TBD, pending clinical improvement              Hospital Problems   Date Reviewed: 11/28/2012                            Codes  Class  Noted  POA              Pain of left lower extremity  ICD-10-CM: M79.605   ICD-9-CM: 729.5    07/02/2021  Unknown                        Acute idiopathic gout of left knee  ICD-10-CM: M10.062   ICD-9-CM: 274.01    07/02/2021  Unknown                        Spinal stenosis of lumbar region without neurogenic claudication  ICD-10-CM: M48.061   ICD-9-CM: 724.02    07/02/2021  Unknown                          Review of Systems:      A comprehensive review of systems was negative except for that written in the HPI.            Vital Signs:      Last 24hrs VS reviewed since prior progress note. Most recent are:   Visit Vitals      BP  115/67 (BP 1 Location: Right upper arm, BP Patient Position: At rest)     Pulse  77     Temp  97.7 ??F (36.5 ??C)     Resp  16     Ht  5' (1.524 m)     Wt  90.7 kg (200 lb)     SpO2  94%        BMI  39.06 kg/m??              Intake/Output Summary (Last 24 hours) at 07/03/2021 1337  Last data filed at 07/03/2021 0630     Gross per 24 hour        Intake  --        Output  380 ml        Net  -380 ml              Physical Examination:        I had a face to face encounter with this patient and independently examined them on 07/03/2021 as outlined below:                Constitutional:   No acute distress, cooperative, pleasant, + pain back and left knee with movement     ENT:   Oral mucosa moist, oropharynx benign.      Resp:   CTA bilaterally. No wheezing/rhonchi/rales. No accessory muscle use.      CV:   Regular rhythm, normal rate, no gallops, rubs      GI:   Soft, non distended, non tender. normoactive bowel sounds, no hepatosplenomegaly       Musculoskeletal:   No edema, warm, 2+ pulses throughout         Neurologic:   Decreased ROM LLE, left knee.  AAOx3, CN II-XII reviewed, left knee edema but no redness                      Data Review:      Review and/or order of clinical lab test      MRI spine:   FINDINGS:       There is 4.6 mm anterolisthesis of L4 relative to L5 without associated   spondylolysis. Vertebral body heights are maintained. Marrow signal is mildly   heterogeneous without a discrete lesion.       The conus medullaris terminates at L1. Signal and caliber of the distal spinal   cord are within normal limits.        The paraspinal soft tissues are within normal limits.       Lower thoracic spine: Disc bulges at T10-11, T11-12, and T12-L1 No herniation or   stenosis.       L1-L2: No herniation or  stenosis.       L2-L3: Mild disc bulge, in combination with the bilateral facet arthropathy and   ligamentum flavum hypertrophy, results in central canal diameter of 7.5 mm.   There is mild bilateral neural foraminal stenosis (series 5, image 8).       L3-L4: Diffuse disc bulge, in combination with the bilateral facet arthropathy   and ligamentum flavum hypertrophy. The central canal measures 5.7 mm anterior to   posterior. Moderate bilateral neural foraminal stenosis (series 5, image 11).       L4-L5: Broad disc bulge, in combination with the moderate bilateral ligamentum   flavum hypertrophy and severe bilateral facet arthropathy. The central canal is   triangulated to about 2 to 3 mm (series 5, image 14). Moderate right and   mild-to-moderate left neural foraminal stenosis.       L5-S1: Broad disc bulge. Bilateral facet arthropathy and ligamentum flavum   hypertrophy. No significant central canal stenosis. Mild bilateral neural   foraminal stenosis. (Series 5, image 17).       IMPRESSION   Spinal stenosis, Critical at L4-5, severe at L3-4, and moderate at L2-3.   Moderate bilateral L3-4 and right L4-5 neural foraminal stenosis   Grade 1 anterolisthesis of L4 on a degenerative basis  XR left knee:   FINDINGS: 4 of the left knee demonstrate no fracture or other acute osseous or   articular abnormality. There is a joint effusion. There is moderate   tricompartmental DJD. Vascular calcifications are noted.       IMPRESSION   Joint effusion. DJD. No evidence of fracture.        Labs:          Recent Labs           07/01/21   1051     WBC  8.0     HGB  14.0     HCT  41.2        PLT  227          Recent Labs            07/02/21   1525  07/01/21   1051     NA   --   140     K   --   3.7     CL   --   102     CO2   --   29     BUN   --   13     CREA   --   1.34*     GLU   --   121*     CA   --   9.2         URICA  5.5   --           Recent Labs           07/01/21   1051     ALT  15     AP  105     TBILI  0.7     TP   7.9     ALB  3.6        GLOB  4.3*        No results for input(s): INR, PTP, APTT, INREXT in the last 72 hours.    No results for input(s): FE, TIBC, PSAT, FERR in the last 72 hours.    No results found for: FOL, RBCF    No results for input(s): PH, PCO2, PO2 in the last 72 hours.   No results for input(s): CPK, CKNDX, TROIQ in the last 72 hours.      No lab exists for component: CPKMB     Lab Results         Component  Value  Date/Time            Cholesterol, total  162  11/27/2012 01:35 AM       HDL Cholesterol  59  11/27/2012 01:35 AM       LDL, calculated  74.4  11/27/2012 01:35 AM       Triglyceride  143  11/27/2012 01:35 AM            CHOL/HDL Ratio  2.7  11/27/2012 01:35 AM        No results found for: [facility]     Lab Results         Component  Value  Date/Time            Color  YELLOW/STRAW  07/01/2021 01:30 PM       Appearance  CLEAR  07/01/2021 01:30 PM       Specific gravity  1.010  07/01/2021 01:30 PM       pH (UA)  6.5  07/01/2021 01:30 PM  Protein  TRACE (A)  07/01/2021 01:30 PM            Glucose  Negative  07/01/2021 01:30 PM       Ketone  Negative  07/01/2021 01:30 PM       Bilirubin  Negative  07/01/2021 01:30 PM       Urobilinogen  1.0  07/01/2021 01:30 PM       Nitrites  Negative  07/01/2021 01:30 PM       Leukocyte Esterase  Negative  07/01/2021 01:30 PM       Epithelial cells  FEW  07/01/2021 01:30 PM       Bacteria  Negative  07/01/2021 01:30 PM       WBC  0-4  07/01/2021 01:30 PM            RBC  0-5  07/01/2021 01:30 PM                Medications Reviewed:          Current Facility-Administered Medications          Medication  Dose  Route  Frequency           ?  docusate sodium (COLACE) capsule 100 mg   100 mg  Oral  BID     ?  hydrALAZINE (APRESOLINE) 20 mg/mL injection 10 mg   10 mg  IntraVENous  Q6H PRN     ?  DULoxetine (CYMBALTA) capsule 20 mg   20 mg  Oral  DAILY     ?  [Held by provider] allopurinoL (ZYLOPRIM) tablet 100 mg   100 mg  Oral  DAILY     ?  colchicine tablet 0.6  mg   0.6 mg  Oral  DAILY     ?  predniSONE (DELTASONE) tablet 40 mg   40 mg  Oral  DAILY WITH BREAKFAST     ?  morphine injection 4 mg   4 mg  IntraVENous  Q3H PRN     ?  sodium chloride (NS) flush 5-40 mL   5-40 mL  IntraVENous  Q8H     ?  sodium chloride (NS) flush 5-40 mL   5-40 mL  IntraVENous  PRN     ?  acetaminophen (TYLENOL) tablet 650 mg   650 mg  Oral  Q6H PRN          Or           ?  acetaminophen (TYLENOL) suppository 650 mg   650 mg  Rectal  Q6H PRN           ?  polyethylene glycol (MIRALAX) packet 17 g   17 g  Oral  DAILY PRN           ?  ondansetron (ZOFRAN ODT) tablet 4 mg   4 mg  Oral  Q8H PRN          Or           ?  ondansetron (ZOFRAN) injection 4 mg   4 mg  IntraVENous  Q6H PRN     ?  enoxaparin (LOVENOX) injection 40 mg   40 mg  SubCUTAneous  DAILY     ?  clopidogreL (PLAVIX) tablet 75 mg   75 mg  Oral  DAILY     ?  isosorbide mononitrate ER (IMDUR) tablet 30 mg   30 mg  Oral  DAILY     ?  rosuvastatin (CRESTOR) tablet 40 mg  40 mg  Oral  QHS     ?  naloxone (NARCAN) injection 0.4 mg   0.4 mg  IntraVENous  PRN     ?  cyclobenzaprine (FLEXERIL) tablet 10 mg   10 mg  Oral  TID PRN     ?  metoprolol succinate (TOPROL-XL) XL tablet 25 mg   25 mg  Oral  DAILY           ?  levothyroxine (SYNTHROID) tablet 75 mcg   75 mcg  Oral  6am        ______________________________________________________________________   EXPECTED LENGTH OF STAY: - - -   ACTUAL LENGTH OF STAY:          0                    Ginger Carne, MD

## 2021-07-03 NOTE — Progress Notes (Signed)
Report taken from night shift Nurse Rowland LatheEucharia, RN.  Report included SBAR, MAR, KARDEX, LAB RESULTS, INTAKE/OUT, CARDIAC RHYTHM and all care given during shift.

## 2021-07-04 LAB — EKG 12-LEAD
Atrial Rate: 77 {beats}/min
Diagnosis: NORMAL
P Axis: 68 degrees
P-R Interval: 142 ms
Q-T Interval: 446 ms
QRS Duration: 128 ms
QTc Calculation (Bazett): 504 ms
R Axis: -20 degrees
T Axis: 154 degrees
Ventricular Rate: 77 {beats}/min

## 2021-07-04 LAB — EKG, 12 LEAD, INITIAL
Atrial Rate: 77 {beats}/min
Calculated P Axis: 68 degrees
Calculated R Axis: -20 degrees
Calculated T Axis: 154 degrees
Diagnosis: NORMAL
P-R Interval: 142 ms
Q-T Interval: 446 ms
QRS Duration: 128 ms
QTC Calculation (Bezet): 504 ms
Ventricular Rate: 77 {beats}/min

## 2021-07-04 MED ORDER — MORPHINE 2 MG/ML INJECTION
2 mg/mL | INTRAMUSCULAR | Status: AC | PRN
Start: 2021-07-04 — End: 2021-07-06

## 2021-07-04 MED FILL — ISOSORBIDE MONONITRATE SR 30 MG 24 HR TAB: 30 mg | ORAL | Qty: 1

## 2021-07-04 MED FILL — CLOPIDOGREL 75 MG TAB: 75 mg | ORAL | Qty: 1

## 2021-07-04 MED FILL — ACETAMINOPHEN 325 MG TABLET: 325 mg | ORAL | Qty: 2

## 2021-07-04 MED FILL — ENOXAPARIN 40 MG/0.4 ML SUB-Q SYRINGE: 40 mg/0.4 mL | SUBCUTANEOUS | Qty: 0.4

## 2021-07-04 MED FILL — DOCUSATE SODIUM 100 MG CAP: 100 mg | ORAL | Qty: 1

## 2021-07-04 MED FILL — METOPROLOL SUCCINATE SR 25 MG 24 HR TAB: 25 mg | ORAL | Qty: 1

## 2021-07-04 MED FILL — PREDNISONE 20 MG TAB: 20 mg | ORAL | Qty: 2

## 2021-07-04 MED FILL — LEVOTHYROXINE 25 MCG TAB: 25 mcg | ORAL | Qty: 1

## 2021-07-04 MED FILL — DULOXETINE 20 MG CAP, DELAYED RELEASE: 20 mg | ORAL | Qty: 1

## 2021-07-04 MED FILL — COLCRYS 0.6 MG TABLET: 0.6 mg | ORAL | Qty: 1

## 2021-07-04 NOTE — Progress Notes (Signed)
Formatting of this note might be different from the original.  Patient's gait is unsteady and requires assistance x 1.  Will call for assistance when needed.  Electronically signed by Darcus Austin, RN at 07/04/2021 10:25 AM EST

## 2021-07-04 NOTE — Progress Notes (Signed)
Problem: Pain  Goal: *Control of Pain  Outcome: Progressing Towards Goal     Problem: Pain  Goal: *Control of Pain  Outcome: Progressing Towards Goal     Problem: Falls - Risk of  Goal: *Absence of Falls  Description: Document Schmid Fall Risk and appropriate interventions in the flowsheet.  Outcome: Progressing Towards Goal  Note: Fall Risk Interventions:  Mobility Interventions: Bed/chair exit alarm         Medication Interventions: Bed/chair exit alarm    Elimination Interventions: Call light in reach

## 2021-07-04 NOTE — Progress Notes (Signed)
Patient's gait is unsteady and requires assistance x 1.  Will call for assistance when needed.

## 2021-07-04 NOTE — Progress Notes (Signed)
Formatting of this note might be different from the original.    Problem: Pain  Goal: *Control of Pain  Outcome: Progressing Towards Goal    Problem: Pain  Goal: *Control of Pain  Outcome: Progressing Towards Goal    Problem: Falls - Risk of  Goal: *Absence of Falls  Description: Document Bridgette Habermann Fall Risk and appropriate interventions in the flowsheet.  Outcome: Progressing Towards Goal  Note: Fall Risk Interventions:  Mobility Interventions: Bed/chair exit alarm        Medication Interventions: Bed/chair exit alarm    Elimination Interventions: Call light in reach          Electronically signed by Clarisa Kindred at 07/04/2021  5:44 AM EST

## 2021-07-04 NOTE — Progress Notes (Signed)
Report given to day shift Nurse Lambert Mody.  Report included SBAR, MAR, KARDEX, INTAKE/OUTPUT and all care given during the pm shift.

## 2021-07-04 NOTE — Progress Notes (Signed)
Progress Notes by Ginger Carne, MD at 07/04/21 1150                Author: Ginger Carne, MD  Service: Hospitalist  Author Type: Physician       Filed: 07/04/21 1201  Date of Service: 07/04/21 1150  Status: Signed          Editor: Ginger Carne, MD (Physician)                             [facility]                                                                                              Hospitalist Progress Note   Ginger Carne, MD   Answering service: [phone] OR [phone] from in house phone            Date of Service:  07/04/2021   NAME:  Tanya [name]   DOB:  07-03-1941   MRN:  [id number]   Room: [facility] 212        Admission Summary:        From HPI: Tanya [name] is a 81 y.o.  African American female who presents to ED with c/o left  leg pain and left knee pain for past couple of days.  Patient states that she was in her usual state of health when she had swelling of her left knee followed by severe pain in her left leg to the point that she cannot ambulate without assistance.  Patient  states that she has balance noted and within the back surgeon in [place].  She could not going to surgery given given her cardiac condition.  Endorsed tobacco use denied any excessive alcohol use or recreational drug use.  Denied any chest pain  shortness of breath belly pain difficulty with bowel or bladder          Interval history / Subjective:        + dysphagia   No acute event over night   back pain and left knee pain better controlled after Voltaren   PT evaluation is pending   No acute events over night per RN          Assessment & Plan:          Anticipated discharge date : TBD   Anticipated disposition : SNF/IPR   Barriers to discharge : pain control, PT reevaluation            Left leg pain, DJD, acute POA   Left knee pain and swelling, acute POA   Suspected acute on chronic gout attack POA   Possible acute  on chronic pain related to DJD   Left knee effusion   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  Moderate bilateral L3-L4 and right L4-L5 neuronal foraminal stenosis    -CT lumbar spine multilevel lumbar spondylosis.  No fracture   -CT pelvis  without any fracture or bone lesion   -X-ray hip with degenerative joint disease and osteopenia   -Patient states she was offered back surgery but could not get clearance.     -X-ray knee with joint effusion.  No fever or chills or white count does not suspect septic arthritis   -At home on allopurinol and colchicine   -Uric acid level 5.5   Continue:    -PT OT   -Pain management   -MRI L spine to rule out nerve impingement/herniated disc    -Colchicine and prednisone course for acute gout attack   -Not a candidate for NSAID PO due to CKD   Voltaren topical left knee QID   Prednisone PO       History of CAD status post stent to RCA 2003   No chest pain   Continue Plavix, statins, Metoprolol, Imdur      History of hypothyroidism   Continue replacement Levothyroxine      History of hypertension   BP controlled   Continue Imdur, BB      History of CKD 3B   Check CMP      History of gout   colchicine      History of hyperlipidemia   Statins      History of COPD   Controlled   Not requiring supplemental O2   Neb PRN      History of arthritis         History of stroke   -Resume home regimen once confirmed by pharmacy      Constipation   Colace BID   Miralax PRN   Had BM      Dysphagia   ST       Body mass index is 39.06 kg/m??.   Code Status: DNR   DVT Prophylaxis: Lovenox   Care Plan discussed with: patient and RN   Anticipated Disposition: TBD, pending clinical improvement, will need rehab              Hospital Problems   Date Reviewed: 11/28/2012                            Codes  Class  Noted  POA              Pain of left lower extremity  ICD-10-CM: M79.605   ICD-9-CM: 729.5    07/02/2021   Unknown                        Acute idiopathic gout of left knee  ICD-10-CM: M10.062   ICD-9-CM: 274.01    07/02/2021  Unknown                        Spinal stenosis of lumbar region without neurogenic claudication  ICD-10-CM: M48.061   ICD-9-CM: 724.02    07/02/2021  Unknown                       Review of Systems:     A comprehensive review of systems was negative except for that written in the HPI.            Vital Signs:      Last 24hrs VS reviewed since prior progress note. Most recent are:   Visit Vitals      BP  135/79 (BP 1 Location: Left upper arm)     Pulse  78     Temp  (!) 96.7 ??F (35.9 ??C)     Resp  18     Ht  5' (1.524 m)     Wt  90.7 kg (200 lb)     SpO2  100%        BMI  39.06 kg/m??              Intake/Output Summary (Last 24 hours) at 07/04/2021 1150   Last data filed at 07/03/2021 2035     Gross per 24 hour        Intake  120 ml        Output  --        Net  120 ml                 Physical Examination:        I had a face to face encounter with this patient and independently examined them on 07/04/2021 as outlined below:                Constitutional:   No acute distress, cooperative, pleasant, + pain back and left knee with movement     ENT:   Oral mucosa moist, oropharynx benign.      Resp:   Coarse bilaterally, scattered wheezing, no rhonchi/rales. No accessory muscle use.      CV:   Regular rhythm, normal rate, no gallops, rubs      GI:   Soft, non distended, non tender. normoactive bowel sounds, no hepatosplenomegaly       Musculoskeletal:   No edema, warm, 2+ pulses throughout         Neurologic:   Decreased ROM LLE, left knee.  AAOx3, CN II-XII reviewed, left knee edema but no redness                      Data Review:      Review and/or order of clinical lab test      MRI spine:   FINDINGS:       There is 4.6 mm anterolisthesis of L4 relative to L5 without associated   spondylolysis. Vertebral body heights are maintained. Marrow signal is mildly   heterogeneous without a discrete lesion.        The conus medullaris terminates at L1. Signal and caliber of the distal spinal   cord are within normal limits.        The paraspinal soft tissues are within normal limits.       Lower thoracic spine: Disc bulges at T10-11, T11-12, and T12-L1 No herniation or   stenosis.       L1-L2: No herniation or stenosis.       L2-L3: Mild disc bulge, in combination with the bilateral facet arthropathy and   ligamentum flavum hypertrophy, results in central canal diameter of 7.5 mm.   There is mild bilateral neural foraminal stenosis (series 5, image 8).       L3-L4: Diffuse disc bulge, in combination with the bilateral facet arthropathy   and ligamentum flavum hypertrophy. The central canal measures 5.7 mm anterior to   posterior. Moderate bilateral neural foraminal stenosis (series 5, image 11).       L4-L5: Broad disc bulge, in combination with the moderate bilateral ligamentum   flavum hypertrophy and severe bilateral facet arthropathy. The central canal is   triangulated to about 2 to 3 mm (series 5, image 14). Moderate right and   mild-to-moderate left neural  foraminal stenosis.       L5-S1: Broad disc bulge. Bilateral facet arthropathy and ligamentum flavum   hypertrophy. No significant central canal stenosis. Mild bilateral neural   foraminal stenosis. (Series 5, image 17).       IMPRESSION   Spinal stenosis, Critical at L4-5, severe at L3-4, and moderate at L2-3.   Moderate bilateral L3-4 and right L4-5 neural foraminal stenosis   Grade 1 anterolisthesis of L4 on a degenerative basis      XR left knee:   FINDINGS: 4 of the left knee demonstrate no fracture or other acute osseous or   articular abnormality. There is a joint effusion. There is moderate   tricompartmental DJD. Vascular calcifications are noted.       IMPRESSION   Joint effusion. DJD. No evidence of fracture.        Labs:        No results for input(s): WBC, HGB, HCT, PLT, HGBEXT, HCTEXT, PLTEXT, HGBEXT, HCTEXT, PLTEXT in the last 72 hours.        Recent  Labs           07/02/21   1525        URICA  5.5           No results for input(s): ALT, AP, TBIL, TBILI, TP, ALB, GLOB, GGT, AML, LPSE in the last 72 hours.      No lab exists for component: SGOT, GPT, AMYP, HLPSE      No results for input(s): INR, PTP, APTT, INREXT, INREXT in the last 72 hours.    No results for input(s): FE, TIBC, PSAT, FERR in the last 72 hours.    No results found for: FOL, RBCF    No results for input(s): PH, PCO2, PO2 in the last 72 hours.   No results for input(s): CPK, CKNDX, TROIQ in the last 72 hours.      No lab exists for component: CPKMB     Lab Results         Component  Value  Date/Time            Cholesterol, total  162  11/27/2012 01:35 AM       HDL Cholesterol  59  11/27/2012 01:35 AM       LDL, calculated  74.4  11/27/2012 01:35 AM       Triglyceride  143  11/27/2012 01:35 AM            CHOL/HDL Ratio  2.7  11/27/2012 01:35 AM        No results found for: [facility]     Lab Results         Component  Value  Date/Time            Color  YELLOW/STRAW  07/01/2021 01:30 PM       Appearance  CLEAR  07/01/2021 01:30 PM       Specific gravity  1.010  07/01/2021 01:30 PM       pH (UA)  6.5  07/01/2021 01:30 PM       Protein  TRACE (A)  07/01/2021 01:30 PM       Glucose  Negative  07/01/2021 01:30 PM       Ketone  Negative  07/01/2021 01:30 PM       Bilirubin  Negative  07/01/2021 01:30 PM       Urobilinogen  1.0  07/01/2021 01:30 PM       Nitrites  Negative  07/01/2021  01:30 PM       Leukocyte Esterase  Negative  07/01/2021 01:30 PM       Epithelial cells  FEW  07/01/2021 01:30 PM       Bacteria  Negative  07/01/2021 01:30 PM       WBC  0-4  07/01/2021 01:30 PM            RBC  0-5  07/01/2021 01:30 PM                Medications Reviewed:          Current Facility-Administered Medications          Medication  Dose  Route  Frequency           ?  morphine injection 2 mg   2 mg  IntraVENous  Q3H PRN     ?  docusate sodium (COLACE) capsule 100 mg   100 mg  Oral  BID     ?  diclofenac (VOLTAREN) 1  % topical gel 2 g   2 g  Topical  QID     ?  albuterol-ipratropium (DUO-NEB) 2.5 MG-0.5 MG/3 ML   3 mL  Nebulization  Q6H PRN     ?  hydrALAZINE (APRESOLINE) 20 mg/mL injection 10 mg   10 mg  IntraVENous  Q6H PRN     ?  DULoxetine (CYMBALTA) capsule 20 mg   20 mg  Oral  DAILY     ?  [Held by provider] allopurinoL (ZYLOPRIM) tablet 100 mg   100 mg  Oral  DAILY     ?  colchicine tablet 0.6 mg   0.6 mg  Oral  DAILY     ?  predniSONE (DELTASONE) tablet 40 mg   40 mg  Oral  DAILY WITH BREAKFAST     ?  sodium chloride (NS) flush 5-40 mL   5-40 mL  IntraVENous  Q8H     ?  sodium chloride (NS) flush 5-40 mL   5-40 mL  IntraVENous  PRN           ?  acetaminophen (TYLENOL) tablet 650 mg   650 mg  Oral  Q6H PRN          Or           ?  acetaminophen (TYLENOL) suppository 650 mg   650 mg  Rectal  Q6H PRN     ?  polyethylene glycol (MIRALAX) packet 17 g   17 g  Oral  DAILY PRN     ?  ondansetron (ZOFRAN ODT) tablet 4 mg   4 mg  Oral  Q8H PRN          Or           ?  ondansetron (ZOFRAN) injection 4 mg   4 mg  IntraVENous  Q6H PRN     ?  enoxaparin (LOVENOX) injection 40 mg   40 mg  SubCUTAneous  DAILY     ?  clopidogreL (PLAVIX) tablet 75 mg   75 mg  Oral  DAILY     ?  isosorbide mononitrate ER (IMDUR) tablet 30 mg   30 mg  Oral  DAILY     ?  rosuvastatin (CRESTOR) tablet 40 mg   40 mg  Oral  QHS     ?  naloxone (NARCAN) injection 0.4 mg   0.4 mg  IntraVENous  PRN     ?  cyclobenzaprine (FLEXERIL) tablet 10 mg  10 mg  Oral  TID PRN     ?  metoprolol succinate (TOPROL-XL) XL tablet 25 mg   25 mg  Oral  DAILY           ?  levothyroxine (SYNTHROID) tablet 75 mcg   75 mcg  Oral  6am        ______________________________________________________________________   EXPECTED LENGTH OF STAY: - - -   ACTUAL LENGTH OF STAY:          0                    Ginger Carne, MD

## 2021-07-04 NOTE — Progress Notes (Signed)
Formatting of this note is different from the original.  Images from the original note were not included.       [facility]               Hospitalist Progress Note  Ginger CarneElena Yuryevna Atchikova, MD  Answering service: [phone] from in house phone      Date of Service:  07/04/2021  NAME:  Tanya [name]  DOB:  1940-11-10  MRN:  [id number]  [facility]    Admission Summary:   From HPI: Tanya [name] is a 81 y.o.  African American female who presents to ED with c/o left leg pain and left knee pain for past couple of days.  Patient states that she was in her usual state of health when she had swelling of her left knee followed by severe pain in her left leg to the point that she cannot ambulate without assistance.  Patient states that she has balance noted and [place]  She could not going to surgery given given her cardiac condition.  Endorsed tobacco use denied any excessive alcohol use or recreational drug use.  Denied any chest pain shortness of breath belly pain difficulty with bowel or bladder      Interval history / Subjective:   + dysphagia  No acute event over night  back pain and left knee pain better controlled after Voltaren  PT evaluation is pending  No acute events over night per RN     Assessment & Plan:     Anticipated discharge date : TBD  Anticipated disposition : SNF/IPR  Barriers to discharge : pain control, PT reevaluation      Left leg pain, DJD, acute POA  Left knee pain and swelling, acute POA  Suspected acute on chronic gout attack POA  Possible acute on chronic pain related to DJD  Left knee effusion  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  Moderate bilateral L3-L4 and right L4-L5 neuronal foraminal stenosis   -CT lumbar spine multilevel lumbar spondylosis.  No fracture  -CT pelvis without any fracture or bone  lesion  -X-ray hip with degenerative joint disease and osteopenia  -Patient states she was offered back surgery but could not get clearance.    -X-ray knee with joint effusion.  No fever or chills or white count does not suspect septic arthritis  -At home on allopurinol and colchicine  -Uric acid level 5.5  Continue:   -PT OT  -Pain management  -MRI L spine to rule out nerve impingement/herniated disc   -Colchicine and prednisone course for acute gout attack  -Not a candidate for NSAID PO due to CKD  Voltaren topical left knee QID  Prednisone PO    History of CAD status post stent to RCA 2003  No chest pain  Continue Plavix, statins, Metoprolol, Imdur    History of hypothyroidism  Continue replacement Levothyroxine    History of hypertension  BP controlled  Continue Imdur, BB    History of CKD 3B  Check CMP    History of gout  colchicine    History of hyperlipidemia  Statins    History of COPD  Controlled  Not requiring supplemental O2  Neb PRN    History of arthritis    History of stroke  -Resume home regimen once confirmed by pharmacy  Constipation  Colace BID  Miralax PRN  Had BM    Dysphagia  ST    Body mass index is 39.06 kg/m.  Code Status: DNR  DVT Prophylaxis: Lovenox  Care Plan discussed with: patient and RN  Anticipated Disposition: TBD, pending clinical improvement, will need rehab      Hospital Problems  Date Reviewed: Dec 25, 2012            Codes Class Noted POA    Pain of left lower extremity ICD-10-CM: M79.605  ICD-9-CM: 729.5  07/02/2021 Unknown      Acute idiopathic gout of left knee ICD-10-CM: M10.062  ICD-9-CM: 274.01  07/02/2021 Unknown      Spinal stenosis of lumbar region without neurogenic claudication ICD-10-CM: M48.061  ICD-9-CM: 724.02  07/02/2021 Unknown       Review of Systems:   A comprehensive review of systems was negative except for that written in the HPI.     Vital Signs:    Last 24hrs VS reviewed since prior progress note. Most recent are:  Visit Vitals  BP 135/79 (BP 1 Location:  Left upper arm)   Pulse 78   Temp (!) 96.7 F (35.9 C)   Resp 18   Ht 5' (1.524 m)   Wt 90.7 kg (200 lb)   SpO2 100%   BMI 39.06 kg/m     Intake/Output Summary (Last 24 hours) at 07/04/2021 1150  Last data filed at 07/03/2021 2035  Gross per 24 hour   Intake 120 ml   Output --   Net 120 ml         Physical Examination:     I had a face to face encounter with this patient and independently examined them on 07/04/2021 as outlined below:      Constitutional:  No acute distress, cooperative, pleasant, + pain back and left knee with movement   ENT:  Oral mucosa moist, oropharynx benign.    Resp:  Coarse bilaterally, scattered wheezing, no rhonchi/rales. No accessory muscle use.    CV:  Regular rhythm, normal rate, no gallops, rubs    GI:  Soft, non distended, non tender. normoactive bowel sounds, no hepatosplenomegaly     Musculoskeletal:  No edema, warm, 2+ pulses throughout    Neurologic:  Decreased ROM LLE, left knee.  AAOx3, CN II-XII reviewed, left knee edema but no redness         Data Review:    Review and/or order of clinical lab test    MRI spine:  FINDINGS:    There is 4.6 mm anterolisthesis of L4 relative to L5 without associated  spondylolysis. Vertebral body heights are maintained. Marrow signal is mildly  heterogeneous without a discrete lesion.    The conus medullaris terminates at L1. Signal and caliber of the distal spinal  cord are within normal limits.     The paraspinal soft tissues are within normal limits.    Lower thoracic spine: Disc bulges at T10-11, T11-12, and T12-L1 No herniation or  stenosis.    L1-L2: No herniation or stenosis.    L2-L3: Mild disc bulge, in combination with the bilateral facet arthropathy and  ligamentum flavum hypertrophy, results in central canal diameter of 7.5 mm.  There is mild bilateral neural foraminal stenosis (series 5, image 8).    L3-L4: Diffuse disc bulge, in combination with the bilateral facet arthropathy  and ligamentum flavum hypertrophy. The central canal  measures 5.7 mm anterior to  posterior. Moderate bilateral neural foraminal stenosis (series 5, image  11).    L4-L5: Broad disc bulge, in combination with the moderate bilateral ligamentum  flavum hypertrophy and severe bilateral facet arthropathy. The central canal is  triangulated to about 2 to 3 mm (series 5, image 14). Moderate right and  mild-to-moderate left neural foraminal stenosis.    L5-S1: Broad disc bulge. Bilateral facet arthropathy and ligamentum flavum  hypertrophy. No significant central canal stenosis. Mild bilateral neural  foraminal stenosis. (Series 5, image 17).    IMPRESSION  Spinal stenosis, Critical at L4-5, severe at L3-4, and moderate at L2-3.  Moderate bilateral L3-4 and right L4-5 neural foraminal stenosis  Grade 1 anterolisthesis of L4 on a degenerative basis    XR left knee:  FINDINGS: 4 of the left knee demonstrate no fracture or other acute osseous or  articular abnormality. There is a joint effusion. There is moderate  tricompartmental DJD. Vascular calcifications are noted.    IMPRESSION  Joint effusion. DJD. No evidence of fracture.    Labs:     No results for input(s): WBC, HGB, HCT, PLT, HGBEXT, HCTEXT, PLTEXT, HGBEXT, HCTEXT, PLTEXT in the last 72 hours.    Recent Labs     07/02/21  1525   URICA 5.5     No results for input(s): ALT, AP, TBIL, TBILI, TP, ALB, GLOB, GGT, AML, LPSE in the last 72 hours.    No lab exists for component: SGOT, GPT, AMYP, HLPSE    No results for input(s): INR, PTP, APTT, INREXT, INREXT in the last 72 hours.   No results for input(s): FE, TIBC, PSAT, FERR in the last 72 hours.   No results found for: FOL, RBCF   No results for input(s): PH, PCO2, PO2 in the last 72 hours.  No results for input(s): CPK, CKNDX, TROIQ in the last 72 hours.    No lab exists for component: CPKMB  Lab Results   Component Value Date/Time    Cholesterol, total 162 11/27/2012 01:35 AM    HDL Cholesterol 59 11/27/2012 01:35 AM    LDL, calculated 74.4 11/27/2012 01:35 AM     Triglyceride 143 11/27/2012 01:35 AM    CHOL/HDL Ratio 2.7 11/27/2012 01:35 AM     No results found for: [facility]  Lab Results   Component Value Date/Time    Color YELLOW/STRAW 07/01/2021 01:30 PM    Appearance CLEAR 07/01/2021 01:30 PM    Specific gravity 1.010 07/01/2021 01:30 PM    pH (UA) 6.5 07/01/2021 01:30 PM    Protein TRACE (A) 07/01/2021 01:30 PM    Glucose Negative 07/01/2021 01:30 PM    Ketone Negative 07/01/2021 01:30 PM    Bilirubin Negative 07/01/2021 01:30 PM    Urobilinogen 1.0 07/01/2021 01:30 PM    Nitrites Negative 07/01/2021 01:30 PM    Leukocyte Esterase Negative 07/01/2021 01:30 PM    Epithelial cells FEW 07/01/2021 01:30 PM    Bacteria Negative 07/01/2021 01:30 PM    WBC 0-4 07/01/2021 01:30 PM    RBC 0-5 07/01/2021 01:30 PM     Medications Reviewed:     Current Facility-Administered Medications   Medication Dose Route Frequency    morphine injection 2 mg  2 mg IntraVENous Q3H PRN    docusate sodium (COLACE) capsule 100 mg  100 mg Oral BID    diclofenac (VOLTAREN) 1 % topical gel 2 g  2 g Topical QID    albuterol-ipratropium (DUO-NEB) 2.5 MG-0.5 MG/3 ML  3 mL Nebulization Q6H PRN    hydrALAZINE (APRESOLINE) 20 mg/mL injection  10 mg  10 mg IntraVENous Q6H PRN    DULoxetine (CYMBALTA) capsule 20 mg  20 mg Oral DAILY    [Held by provider] allopurinoL (ZYLOPRIM) tablet 100 mg  100 mg Oral DAILY    colchicine tablet 0.6 mg  0.6 mg Oral DAILY    predniSONE (DELTASONE) tablet 40 mg  40 mg Oral DAILY WITH BREAKFAST    sodium chloride (NS) flush 5-40 mL  5-40 mL IntraVENous Q8H    sodium chloride (NS) flush 5-40 mL  5-40 mL IntraVENous PRN    acetaminophen (TYLENOL) tablet 650 mg  650 mg Oral Q6H PRN    Or    acetaminophen (TYLENOL) suppository 650 mg  650 mg Rectal Q6H PRN    polyethylene glycol (MIRALAX) packet 17 g  17 g Oral DAILY PRN    ondansetron (ZOFRAN ODT) tablet 4 mg  4 mg Oral Q8H PRN    Or    ondansetron (ZOFRAN) injection 4 mg  4 mg IntraVENous Q6H PRN    enoxaparin (LOVENOX) injection 40  mg  40 mg SubCUTAneous DAILY    clopidogreL (PLAVIX) tablet 75 mg  75 mg Oral DAILY    isosorbide mononitrate ER (IMDUR) tablet 30 mg  30 mg Oral DAILY    rosuvastatin (CRESTOR) tablet 40 mg  40 mg Oral QHS    naloxone (NARCAN) injection 0.4 mg  0.4 mg IntraVENous PRN    cyclobenzaprine (FLEXERIL) tablet 10 mg  10 mg Oral TID PRN    metoprolol succinate (TOPROL-XL) XL tablet 25 mg  25 mg Oral DAILY    levothyroxine (SYNTHROID) tablet 75 mcg  75 mcg Oral 6am     ______________________________________________________________________  EXPECTED LENGTH OF STAY: - - -  ACTUAL LENGTH OF STAY:          0      Elena Tretha SciaraYuryevna Atchikova, MD    Electronically signed by Ginger CarneAtchikova, Elena Yuryevna, MD at 07/04/2021 12:01 PM EST

## 2021-07-04 NOTE — Progress Notes (Signed)
Formatting of this note might be different from the original.  Report given to day shift Nurse Lambert Mody.  Report included SBAR, MAR, KARDEX, INTAKE/OUTPUT and all care given during the pm shift.  Electronically signed by Clarisa Kindred at 07/04/2021  7:52 AM EST

## 2021-07-05 MED FILL — LEVOTHYROXINE 25 MCG TAB: 25 mcg | ORAL | Qty: 1

## 2021-07-05 MED FILL — PREDNISONE 20 MG TAB: 20 mg | ORAL | Qty: 2

## 2021-07-05 MED FILL — CLOPIDOGREL 75 MG TAB: 75 mg | ORAL | Qty: 1

## 2021-07-05 MED FILL — ROSUVASTATIN 10 MG TAB: 10 mg | ORAL | Qty: 4

## 2021-07-05 MED FILL — DULOXETINE 20 MG CAP, DELAYED RELEASE: 20 mg | ORAL | Qty: 1

## 2021-07-05 MED FILL — COLCRYS 0.6 MG TABLET: 0.6 mg | ORAL | Qty: 1

## 2021-07-05 MED FILL — ENOXAPARIN 40 MG/0.4 ML SUB-Q SYRINGE: 40 mg/0.4 mL | SUBCUTANEOUS | Qty: 0.4

## 2021-07-05 MED FILL — METOPROLOL SUCCINATE SR 25 MG 24 HR TAB: 25 mg | ORAL | Qty: 1

## 2021-07-05 MED FILL — DOCUSATE SODIUM 100 MG CAP: 100 mg | ORAL | Qty: 1

## 2021-07-05 MED FILL — ISOSORBIDE MONONITRATE SR 30 MG 24 HR TAB: 30 mg | ORAL | Qty: 1

## 2021-07-05 NOTE — Progress Notes (Signed)
SPEECH PATHOLOGY BEDSIDE SWALLOW EVALUATION/DISCHARGE  Patient: Tanya [name] (81 y.o. female)  Date: 07/05/2021  Primary Diagnosis: Lumbago [M54.50]       Precautions: fall       ASSESSMENT :  Based on the objective data described below, the patient presents with functional oropharyngeal swallow based on clinical assessment. She complains of prolonged history of globus sensation at the level of the sternal notch associated with eating dry solids (chicken and bread) and PO medications. This is accompanied by burping. Food does not come back up, but the sensation remains until she has taken numerous sips of water to clear, or the pill feels as if it has dissolved. She has a known history of reflux. There are no s/s of aspiration associated with PO intake. No reported history of pneumonia, other than when she had "a touch of COVID". Given her history and clinical presentation, suspect an esophageal dysphagia. Additionally, patient is concerned about her jaw clicking. She says she can hear it when chewing. It was palpable. She also endorses having several teeth that have broken off at the roots. This does not appear to impact swallow function.   Skilled acute therapy provided by a speech-language pathologist is not indicated at this time.     PLAN :  Recommendations:  Diet as tolerated  GI follow up for concerns of esophageal dysphagia-can do this outpatient  Follow with dentist for broken off teeth and clicking of jaw  Neither of these appears to be acute, though symptoms are bothering patient.   Discharge Recommendations: To Be Determined     SUBJECTIVE:   Patient stated ???Can you hear my jaw, I'm worried people can hear it when I eat???.    OBJECTIVE:     Past Medical History:   Diagnosis Date    Acid reflux     Asthma     CAD (coronary artery disease)     Hypertension      Past Surgical History:   Procedure Laterality Date    HX ORTHOPAEDIC      right knee replacement    PR CARDIAC SURG PROCEDURE UNLIST      Stents      Prior Level of Function/Home Situation:    Home Situation  Home Environment: Apartment  One/Two Story Residence: One story  Living Alone: No  Support Systems: Other Family Member(s)  Patient Expects to be Discharged to:: Home  Current DME Used/Available at Home: Environmental consultant, rollator, Medical laboratory scientific officer, straight  Diet prior to admission: regular with thins  Current Diet:  regular with thins   Cognitive and Communication Status:  Neurologic State: Alert  Orientation Level: Oriented X4  Cognition: Follows commands  Perception: Appears intact  Perseveration: No perseveration noted     Oral Assessment:  Oral Assessment  Labial: No impairment  Dentition: Limited;Natural  Oral Hygiene: moist  Lingual: No impairment  Mandible: No impairment  P.O. Trials:  Patient Position: up in bed  Vocal quality prior to P.O.: No impairment  Consistency Presented: Thin liquid;Solid;Puree  How Presented: Self-fed/presented;Straw;Spoon     Bolus Acceptance: No impairment  Bolus Formation/Control: No impairment     Propulsion: No impairment  Oral Residue: None  Initiation of Swallow: No impairment     Aspiration Signs/Symptoms: None                       Pain:  Pain Scale 1: Numeric (0 - 10)  Pain Intensity 1: 0  Pain Location 1: Back;Knee  After  treatment:   Patient left in no apparent distress in bed, Call bell within reach, and Nursing notified    COMMUNICATION/EDUCATION:   Patient was educated regarding purpose of SLP visit. Recommendations for follow up with GI/dentist based on her symptoms.     The patient's plan of care including recommendations, planned interventions, and recommended diet changes were discussed with: Registered nurse.     Thank you for this referral.  Mirian Capuchin, SLP  Time Calculation: 14 mins

## 2021-07-05 NOTE — Progress Notes (Signed)
Formatting of this note might be different from the original.  1915 Bedside shift change report given to Eucheria,RN (Cabin crew) by Preston Fleeting (offgoing nurse). Report included the following information SBAR, Kardex, Intake/Output, and MAR.      0715 Bedside shift change report given to Franchot Heidelberg (oncoming nurse) by Jamesetta So (offgoing nurse). Report included the following information SBAR, Kardex, Intake/Output, and MAR.    Electronically signed by Jarvis Newcomer, RN at 07/06/2021  7:29 AM EST

## 2021-07-05 NOTE — Progress Notes (Signed)
Formatting of this note is different from the original.  Images from the original note were not included.      [facility]               Hospitalist Progress Note  Ginger Carne, MD  Answering service: [phone] OR [phone] from in house phone      Date of Service:  07/05/2021  NAME:  Tanya [name]  DOB:  [birth year]  MRN:  [id number]  Room: [facility] 212    Admission Summary:   From HPI: Tanya [name] is a 81 y.o.  African American female who presents to ED with c/o left leg pain and left knee pain for past couple of days.  Patient states that she was in her usual state of health when she had swelling of her left knee followed by severe pain in her left leg to the point that she cannot ambulate without assistance.  Patient states that she has balance noted and within the back surgeon in [place].  She could not going to surgery given given her cardiac condition.  Endorsed tobacco use denied any excessive alcohol use or recreational drug use.  Denied any chest pain shortness of breath belly pain difficulty with bowel or bladder      Interval history / Subjective:   NAD, no acute event over night  Back pain and left knee pain are better controlled after Voltaren topical provided  PT evaluation is pending  Discussed with ST, input appreciated  Pt able to tolerate PO diet  Per ST recommended follow up GI as outpatient for possible esophageal dysphagia  Discussed with case management plan for disposition     Assessment & Plan:     Anticipated discharge date : TBD  Anticipated disposition : SNF/IPR  Barriers to discharge : pain control, PT reevaluation      Left leg pain, DJD, acute POA, improved  Left knee pain and swelling, acute POA, improved  Suspected acute on chronic gout attack POA, less likely  Possible acute on chronic pain related to DJD  Left knee effusion  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  Moderate bilateral L3-L4 and right L4-L5 neuronal foraminal stenosis   -CT lumbar spine multilevel lumbar spondylosis.  No fracture  -CT pelvis without any fracture or bone lesion  -X-ray hip with degenerative joint disease and osteopenia  -Patient states she was offered back surgery but could not get clearance.    -X-ray knee with joint effusion.  No fever or chills or white count does not suspect septic arthritis  -At home on allopurinol and colchicine  -Uric acid level 5.5  Continue:   -PT OT  -Pain management  -MRI L spine to rule out nerve impingement/herniated disc   -Colchicine and prednisone course for acute gout attack  -Not a candidate for NSAID PO due to CKD  Voltaren topical left knee QID  Prednisone PO    History of CAD status post stent to RCA 2003  No chest pain  Continue Plavix, statins, Metoprolol, Imdur    History of hypothyroidism  Continue replacement Levothyroxine    History of hypertension  BP controlled  Continue Imdur, BB    History of CKD 3B  Check CMP    History of gout  colchicine    History of hyperlipidemia  Statins    History of COPD  Controlled  Not requiring supplemental O2  Neb PRN    History of arthritis    History of stroke  -Resume home regimen once confirmed by pharmacy    Constipation  Colace BID  Miralax PRN  Had BM    Dysphagia  ST input appreciated  Th patient is able to tolerate PO  Will need follow up GI as outpatient for further evaluation of esophageal dysphagia    Body mass index is 39.06 kg/m.  Code Status: DNR  DVT Prophylaxis: Lovenox  Care Plan discussed with: patient and RN  Anticipated Disposition: TBD, pending PT evaluation, pt might need rehab      Hospital Problems  Date Reviewed: 11/28/2012            Codes Class Noted POA    Pain of left lower extremity ICD-10-CM: M79.605  ICD-9-CM: 729.5  07/02/2021 Unknown      Acute idiopathic gout of left knee ICD-10-CM: M10.062  ICD-9-CM: 274.01  07/02/2021 Unknown      Spinal stenosis of  lumbar region without neurogenic claudication ICD-10-CM: M48.061  ICD-9-CM: 724.02  07/02/2021 Unknown       Review of Systems:   A comprehensive review of systems was negative except for that written in the HPI.     Vital Signs:    Last 24hrs VS reviewed since prior progress note. Most recent are:  Visit Vitals  BP 135/70 (BP Patient Position: Lying right side)   Pulse 63   Temp 98.3 F (36.8 C)   Resp 16   Ht 5' (1.524 m)   Wt 90.7 kg (200 lb)   SpO2 98%   BMI 39.06 kg/m     No intake or output data in the 24 hours ending 07/05/21 1349      Physical Examination:     I had a face to face encounter with this patient and independently examined them on 07/05/2021 as outlined below:      Constitutional:  No acute distress, cooperative, pleasant,    ENT:  Oral mucosa moist, oropharynx benign.    Resp:  Coarse bilaterally, scattered wheezing, no rhonchi/rales. No accessory muscle use.    CV:  Regular rhythm, normal rate, no gallops, rubs    GI:  Soft, non distended, non tender. normoactive bowel sounds, no hepatosplenomegaly     Musculoskeletal:  No edema, warm, 2+ pulses throughout    Neurologic:  Able to move all extremities,  AAOx3, CN II-XII reviewed, left knee edema resolved         Data Review:    Review and/or order of clinical lab test    MRI spine:  FINDINGS:    There is 4.6 mm anterolisthesis of L4 relative to L5 without associated  spondylolysis. Vertebral body heights are maintained. Marrow signal is mildly  heterogeneous without a discrete lesion.    The conus medullaris terminates at L1. Signal and caliber of the distal spinal  cord are within normal limits.     The paraspinal soft tissues are within normal limits.    Lower thoracic spine: Disc bulges at T10-11, T11-12, and T12-L1 No herniation or  stenosis.    L1-L2: No herniation or stenosis.    L2-L3: Mild disc bulge, in combination with the bilateral facet arthropathy and  ligamentum flavum hypertrophy, results in central canal diameter of 7.5  mm.  There is mild bilateral neural foraminal stenosis (series 5, image 8).    L3-L4: Diffuse disc bulge, in combination with the bilateral facet arthropathy  and ligamentum flavum hypertrophy. The central canal measures 5.7 mm anterior to  posterior. Moderate bilateral neural foraminal stenosis (series 5, image 11).    L4-L5: Broad disc bulge, in combination with the moderate bilateral ligamentum  flavum hypertrophy and severe bilateral facet arthropathy. The central canal is  triangulated to about 2 to 3 mm (series 5, image 14). Moderate right and  mild-to-moderate left neural foraminal stenosis.    L5-S1: Broad disc bulge. Bilateral facet arthropathy and ligamentum flavum  hypertrophy. No significant central canal stenosis. Mild bilateral neural  foraminal stenosis. (Series 5, image 17).    IMPRESSION  Spinal stenosis, Critical at L4-5, severe at L3-4, and moderate at L2-3.  Moderate bilateral L3-4 and right L4-5 neural foraminal stenosis  Grade 1 anterolisthesis of L4 on a degenerative basis    XR left knee:  FINDINGS: 4 of the left knee demonstrate no fracture or other acute osseous or  articular abnormality. There is a joint effusion. There is moderate  tricompartmental DJD. Vascular calcifications are noted.    IMPRESSION  Joint effusion. DJD. No evidence of fracture.    Labs:     No results for input(s): WBC, HGB, HCT, PLT, HGBEXT, HCTEXT, PLTEXT, HGBEXT, HCTEXT, PLTEXT in the last 72 hours.    Recent Labs     07/02/21  1525   URICA 5.5     No results for input(s): ALT, AP, TBIL, TBILI, TP, ALB, GLOB, GGT, AML, LPSE in the last 72 hours.    No lab exists for component: SGOT, GPT, AMYP, HLPSE    No results for input(s): INR, PTP, APTT, INREXT, INREXT in the last 72 hours.   No results for input(s): FE, TIBC, PSAT, FERR in the last 72 hours.   No results found for: FOL, RBCF   No results for input(s): PH, PCO2, PO2 in the last 72 hours.  No results for input(s): CPK, CKNDX, TROIQ in the last 72 hours.    No  lab exists for component: CPKMB  Lab Results   Component Value Date/Time    Cholesterol, total 162 11/27/2012 01:35 AM    HDL Cholesterol 59 11/27/2012 01:35 AM    LDL, calculated 74.4 11/27/2012 01:35 AM    Triglyceride 143 11/27/2012 01:35 AM    CHOL/HDL Ratio 2.7 11/27/2012 01:35 AM     No results found for: [facility]  Lab Results   Component Value Date/Time    Color YELLOW/STRAW 07/01/2021 01:30 PM    Appearance CLEAR 07/01/2021 01:30 PM    Specific gravity 1.010 07/01/2021 01:30 PM    pH (UA) 6.5 07/01/2021 01:30 PM    Protein TRACE (A) 07/01/2021 01:30 PM    Glucose Negative 07/01/2021 01:30 PM    Ketone Negative 07/01/2021 01:30 PM    Bilirubin Negative 07/01/2021 01:30 PM    Urobilinogen 1.0 07/01/2021 01:30 PM    Nitrites Negative 07/01/2021 01:30 PM    Leukocyte Esterase Negative 07/01/2021 01:30 PM    Epithelial cells FEW 07/01/2021 01:30 PM    Bacteria Negative 07/01/2021 01:30 PM    WBC 0-4 07/01/2021 01:30 PM    RBC 0-5 07/01/2021 01:30 PM     Medications Reviewed:     Current Facility-Administered Medications   Medication Dose Route Frequency    morphine injection 2 mg  2 mg IntraVENous Q3H PRN    docusate sodium (COLACE) capsule 100 mg  100 mg Oral BID    diclofenac (VOLTAREN) 1 % topical gel 2 g  2 g Topical QID  albuterol-ipratropium (DUO-NEB) 2.5 MG-0.5 MG/3 ML  3 mL Nebulization Q6H PRN    hydrALAZINE (APRESOLINE) 20 mg/mL injection 10 mg  10 mg IntraVENous Q6H PRN    DULoxetine (CYMBALTA) capsule 20 mg  20 mg Oral DAILY    [Held by provider] allopurinoL (ZYLOPRIM) tablet 100 mg  100 mg Oral DAILY    colchicine tablet 0.6 mg  0.6 mg Oral DAILY    predniSONE (DELTASONE) tablet 40 mg  40 mg Oral DAILY WITH BREAKFAST    sodium chloride (NS) flush 5-40 mL  5-40 mL IntraVENous Q8H    sodium chloride (NS) flush 5-40 mL  5-40 mL IntraVENous PRN    acetaminophen (TYLENOL) tablet 650 mg  650 mg Oral Q6H PRN    Or    acetaminophen (TYLENOL) suppository 650 mg  650 mg Rectal Q6H PRN    polyethylene glycol  (MIRALAX) packet 17 g  17 g Oral DAILY PRN    ondansetron (ZOFRAN ODT) tablet 4 mg  4 mg Oral Q8H PRN    Or    ondansetron (ZOFRAN) injection 4 mg  4 mg IntraVENous Q6H PRN    enoxaparin (LOVENOX) injection 40 mg  40 mg SubCUTAneous DAILY    clopidogreL (PLAVIX) tablet 75 mg  75 mg Oral DAILY    isosorbide mononitrate ER (IMDUR) tablet 30 mg  30 mg Oral DAILY    rosuvastatin (CRESTOR) tablet 40 mg  40 mg Oral QHS    naloxone (NARCAN) injection 0.4 mg  0.4 mg IntraVENous PRN    cyclobenzaprine (FLEXERIL) tablet 10 mg  10 mg Oral TID PRN    metoprolol succinate (TOPROL-XL) XL tablet 25 mg  25 mg Oral DAILY    levothyroxine (SYNTHROID) tablet 75 mcg  75 mcg Oral 6am     ______________________________________________________________________  EXPECTED LENGTH OF STAY: - - -  ACTUAL LENGTH OF STAY:          0      Elena Tretha SciaraYuryevna Atchikova, MD    Electronically signed by Ginger CarneAtchikova, Elena Yuryevna, MD at 07/05/2021  1:56 PM EST

## 2021-07-05 NOTE — Progress Notes (Signed)
0938) Bedside shift change report given to Herbert Seta, RN (oncoming nurse) by Sedalia Muta, RN (offgoing nurse). Report included the following information SBAR, Kardex, and MAR.   1020) IDR with Dr. Rica Mast (MD), Kennyth Arnold and Merton Border (pharmacists), Jeanice Lim (CCL), Lorene Dy (wound care) and Herbert Seta (RN) to discuss plan of care including ask pt about flu vaccine, pt in BBB this morning, waiting for PT evaluation if rehab or discharge home.    1635) pt visitor at bedside.   1640) Discuss with Dr. Rica Mast pt concern that she had not been tested for covid.  Slightly coarse lung sounds-- pt afebrile   1644) pt now refusing Covid swab-- discuss with Dr. Rica Mast able to discontinue order  1927) Bedside shift change report given to Regina Eck, RN (oncoming nurse) by Herbert Seta, RN (offgoing nurse). Report included the following information SBAR, Kardex, and MAR.

## 2021-07-05 NOTE — Progress Notes (Signed)
Formatting of this note might be different from the original.  0720) Bedside shift change report given to Herbert Seta, RN (oncoming nurse) by Sedalia Muta, RN (offgoing nurse). Report included the following information SBAR, Kardex, and MAR.   1020) IDR with Dr. Rica Mast (MD), Kennyth Arnold and Merton Border (pharmacists), Jeanice Lim (CCL), Lorene Dy (wound care) and Herbert Seta (RN) to discuss plan of care including ask pt about flu vaccine, pt in BBB this morning, waiting for PT evaluation if rehab or discharge home.    1635) pt visitor at bedside.   1640) Discuss with Dr. Rica Mast pt concern that she had not been tested for covid.  Slightly coarse lung sounds-- pt afebrile   1644) pt now refusing Covid swab-- discuss with Dr. Rica Mast able to discontinue order  1927) Bedside shift change report given to Regina Eck, RN (oncoming nurse) by Herbert Seta, RN (offgoing nurse). Report included the following information SBAR, Kardex, and MAR.           Electronically signed by Danae Chen, RN at 07/05/2021  7:28 PM EST

## 2021-07-05 NOTE — Progress Notes (Signed)
Formatting of this note is different from the original.  SPEECH PATHOLOGY BEDSIDE SWALLOW EVALUATION/DISCHARGE  Patient: Tanya [name] (81 y.o. female)  Date: 07/05/2021  Primary Diagnosis: Lumbago [M54.50]    Precautions: fall       ASSESSMENT :  Based on the objective data described below, the patient presents with functional oropharyngeal swallow based on clinical assessment. She complains of prolonged history of globus sensation at the level of the sternal notch associated with eating dry solids (chicken and bread) and PO medications. This is accompanied by burping. Food does not come back up, but the sensation remains until she has taken numerous sips of water to clear, or the pill feels as if it has dissolved. She has a known history of reflux. There are no s/s of aspiration associated with PO intake. No reported history of pneumonia, other than when she had "a touch of COVID". Given her history and clinical presentation, suspect an esophageal dysphagia. Additionally, patient is concerned about her jaw clicking. She says she can hear it when chewing. It was palpable. She also endorses having several teeth that have broken off at the roots. This does not appear to impact swallow function.   Skilled acute therapy provided by a speech-language pathologist is not indicated at this time.     PLAN :  Recommendations:  Diet as tolerated  GI follow up for concerns of esophageal dysphagia-can do this outpatient  Follow with dentist for broken off teeth and clicking of jaw  Neither of these appears to be acute, though symptoms are bothering patient.   Discharge Recommendations: To Be Determined     SUBJECTIVE:   Patient stated ?Can you hear my jaw, I'm worried people can hear it when I eat?.    OBJECTIVE:     Past Medical History:   Diagnosis Date    Acid reflux     Asthma     CAD (coronary artery disease)     Hypertension      Past Surgical History:   Procedure Laterality Date    HX ORTHOPAEDIC      right knee  replacement    PR CARDIAC SURG PROCEDURE UNLIST      Stents     Prior Level of Function/Home Situation:    Home Situation  Home Environment: Apartment  One/Two Story Residence: One story  Living Alone: No  Support Systems: Other Family Member(s)  Patient Expects to be Discharged to:: Home  Current DME Used/Available at Home: Environmental consultant, rollator, Medical laboratory scientific officer, straight  Diet prior to admission: regular with thins  Current Diet:  regular with thins   Cognitive and Communication Status:  Neurologic State: Alert  Orientation Level: Oriented X4  Cognition: Follows commands  Perception: Appears intact  Perseveration: No perseveration noted    Oral Assessment:  Oral Assessment  Labial: No impairment  Dentition: Limited;Natural  Oral Hygiene: moist  Lingual: No impairment  Mandible: No impairment  P.O. Trials:  Patient Position: up in bed  Vocal quality prior to P.O.: No impairment  Consistency Presented: Thin liquid;Solid;Puree  How Presented: Self-fed/presented;Straw;Spoon    Bolus Acceptance: No impairment  Bolus Formation/Control: No impairment    Propulsion: No impairment  Oral Residue: None  Initiation of Swallow: No impairment    Aspiration Signs/Symptoms: None                Pain:  Pain Scale 1: Numeric (0 - 10)  Pain Intensity 1: 0  Pain Location 1: Back;Knee  After treatment:   Patient  left in no apparent distress in bed, Call bell within reach, and Nursing notified    COMMUNICATION/EDUCATION:   Patient was educated regarding purpose of SLP visit. Recommendations for follow up with GI/dentist based on her symptoms.     The patient's plan of care including recommendations, planned interventions, and recommended diet changes were discussed with: Registered nurse.     Thank you for this referral.  Mirian Capuchin, SLP  Time Calculation: 14 mins      Electronically signed by Mirian Capuchin, SLP at 07/05/2021  8:50 AM EST

## 2021-07-05 NOTE — Progress Notes (Signed)
Progress Notes by Ginger Carne, MD at 07/05/21 1349                Author: Ginger Carne, MD  Service: Hospitalist  Author Type: Physician       Filed: 07/05/21 1356  Date of Service: 07/05/21 1349  Status: Signed          Editor: Ginger Carne, MD (Physician)                             [facility]                                                                                              Hospitalist Progress Note   Ginger Carne, MD   Answering service: [phone] from in house phone            Date of Service:  07/05/2021   NAME:  Tanya [name]   DOB:  14-Jan-1941   MRN:  [id number]   Room: [place] [facility]        Admission Summary:        From HPI: Tanya [name] is a 81 y.o.  African American female who presents to ED with c/o left  leg pain and left knee pain for past couple of days.  Patient states that she was in her usual state of health when she had swelling of her left knee followed by severe pain in her left leg to the point that she cannot ambulate without assistance.  Patient  states that she has balance noted and within the back surgeon in [place].  She could not going to surgery given given her cardiac condition.  Endorsed tobacco use denied any excessive alcohol use or recreational drug use.  Denied any chest pain  shortness of breath belly pain difficulty with bowel or bladder          Interval history / Subjective:        NAD, no acute event over night   Back pain and left knee pain are better controlled after Voltaren topical provided   PT evaluation is pending   Discussed with ST, input appreciated   Pt able to tolerate PO diet   Per ST recommended follow up GI as outpatient for possible esophageal dysphagia   Discussed with case management plan for disposition          Assessment & Plan:          Anticipated discharge date : TBD   Anticipated disposition : SNF/IPR   Barriers to  discharge : pain control, PT reevaluation            Left leg pain, DJD, acute POA, improved   Left knee pain and swelling, acute POA, improved   Suspected acute on chronic gout attack POA, less likely   Possible acute on chronic pain related to DJD   Left knee effusion   Severe degenerative disk disease of lumbar spine POA   -MRI lumbar spine with critical at  L4-L5 severe L3-L4 moderate L2 and L3.  Moderate bilateral L3-L4 and right L4-L5 neuronal foraminal stenosis    -CT lumbar spine multilevel lumbar spondylosis.  No fracture   -CT pelvis without any fracture or bone lesion   -X-ray hip with degenerative joint disease and osteopenia   -Patient states she was offered back surgery but could not get clearance.     -X-ray knee with joint effusion.  No fever or chills or white count does not suspect septic arthritis   -At home on allopurinol and colchicine   -Uric acid level 5.5   Continue:    -PT OT   -Pain management   -MRI L spine to rule out nerve impingement/herniated disc    -Colchicine and prednisone course for acute gout attack   -Not a candidate for NSAID PO due to CKD   Voltaren topical left knee QID   Prednisone PO       History of CAD status post stent to RCA 2003   No chest pain   Continue Plavix, statins, Metoprolol, Imdur      History of hypothyroidism   Continue replacement Levothyroxine      History of hypertension   BP controlled   Continue Imdur, BB      History of CKD 3B   Check CMP      History of gout   colchicine      History of hyperlipidemia   Statins      History of COPD   Controlled   Not requiring supplemental O2   Neb PRN      History of arthritis      History of stroke   -Resume home regimen once confirmed by pharmacy      Constipation   Colace BID   Miralax PRN   Had BM      Dysphagia   ST input appreciated   Th patient is able to tolerate PO   Will need follow up GI as outpatient for further evaluation of esophageal dysphagia       Body mass index is 39.06 kg/m??.   Code Status: DNR    DVT Prophylaxis: Lovenox   Care Plan discussed with: patient and RN   Anticipated Disposition: TBD, pending PT evaluation, pt might need rehab              Hospital Problems   Date Reviewed: 11/28/2012                            Codes  Class  Noted  POA              Pain of left lower extremity  ICD-10-CM: M79.605   ICD-9-CM: 729.5    07/02/2021  Unknown                        Acute idiopathic gout of left knee  ICD-10-CM: M10.062   ICD-9-CM: 274.01    07/02/2021  Unknown                        Spinal stenosis of lumbar region without neurogenic claudication  ICD-10-CM: M48.061   ICD-9-CM: 724.02    07/02/2021  Unknown                    Review of Systems:     A comprehensive review of systems was negative except for that written in the  HPI.            Vital Signs:      Last 24hrs VS reviewed since prior progress note. Most recent are:   Visit Vitals      BP  135/70 (BP Patient Position: Lying right side)     Pulse  63     Temp  98.3 ??F (36.8 ??C)     Resp  16     Ht  5' (1.524 m)     Wt  90.7 kg (200 lb)     SpO2  98%        BMI  39.06 kg/m??           No intake or output data in the 24 hours ending 07/05/21 1349            Physical Examination:        I had a face to face encounter with this patient and independently examined them on 07/05/2021 as outlined below:                Constitutional:   No acute distress, cooperative, pleasant,      ENT:   Oral mucosa moist, oropharynx benign.      Resp:   Coarse bilaterally, scattered wheezing, no rhonchi/rales. No accessory muscle use.      CV:   Regular rhythm, normal rate, no gallops, rubs      GI:   Soft, non distended, non tender. normoactive bowel sounds, no hepatosplenomegaly       Musculoskeletal:   No edema, warm, 2+ pulses throughout         Neurologic:   Able to move all extremities,  AAOx3, CN II-XII reviewed, left knee edema resolved                      Data Review:      Review and/or order of clinical lab test      MRI spine:   FINDINGS:       There is 4.6 mm  anterolisthesis of L4 relative to L5 without associated   spondylolysis. Vertebral body heights are maintained. Marrow signal is mildly   heterogeneous without a discrete lesion.       The conus medullaris terminates at L1. Signal and caliber of the distal spinal   cord are within normal limits.        The paraspinal soft tissues are within normal limits.       Lower thoracic spine: Disc bulges at T10-11, T11-12, and T12-L1 No herniation or   stenosis.       L1-L2: No herniation or stenosis.       L2-L3: Mild disc bulge, in combination with the bilateral facet arthropathy and   ligamentum flavum hypertrophy, results in central canal diameter of 7.5 mm.   There is mild bilateral neural foraminal stenosis (series 5, image 8).       L3-L4: Diffuse disc bulge, in combination with the bilateral facet arthropathy   and ligamentum flavum hypertrophy. The central canal measures 5.7 mm anterior to   posterior. Moderate bilateral neural foraminal stenosis (series 5, image 11).       L4-L5: Broad disc bulge, in combination with the moderate bilateral ligamentum   flavum hypertrophy and severe bilateral facet arthropathy. The central canal is   triangulated to about 2 to 3 mm (series 5, image 14). Moderate right and   mild-to-moderate left neural foraminal stenosis.  L5-S1: Broad disc bulge. Bilateral facet arthropathy and ligamentum flavum   hypertrophy. No significant central canal stenosis. Mild bilateral neural   foraminal stenosis. (Series 5, image 17).       IMPRESSION   Spinal stenosis, Critical at L4-5, severe at L3-4, and moderate at L2-3.   Moderate bilateral L3-4 and right L4-5 neural foraminal stenosis   Grade 1 anterolisthesis of L4 on a degenerative basis      XR left knee:   FINDINGS: 4 of the left knee demonstrate no fracture or other acute osseous or   articular abnormality. There is a joint effusion. There is moderate   tricompartmental DJD. Vascular calcifications are noted.       IMPRESSION   Joint  effusion. DJD. No evidence of fracture.        Labs:        No results for input(s): WBC, HGB, HCT, PLT, HGBEXT, HCTEXT, PLTEXT, HGBEXT, HCTEXT, PLTEXT in the last 72 hours.        Recent Labs           07/02/21   1525        URICA  5.5           No results for input(s): ALT, AP, TBIL, TBILI, TP, ALB, GLOB, GGT, AML, LPSE in the last 72 hours.      No lab exists for component: SGOT, GPT, AMYP, HLPSE      No results for input(s): INR, PTP, APTT, INREXT, INREXT in the last 72 hours.    No results for input(s): FE, TIBC, PSAT, FERR in the last 72 hours.    No results found for: FOL, RBCF    No results for input(s): PH, PCO2, PO2 in the last 72 hours.   No results for input(s): CPK, CKNDX, TROIQ in the last 72 hours.      No lab exists for component: CPKMB     Lab Results         Component  Value  Date/Time            Cholesterol, total  162  11/27/2012 01:35 AM       HDL Cholesterol  59  11/27/2012 01:35 AM       LDL, calculated  74.4  11/27/2012 01:35 AM       Triglyceride  143  11/27/2012 01:35 AM            CHOL/HDL Ratio  2.7  11/27/2012 01:35 AM        No results found for: [facility]     Lab Results         Component  Value  Date/Time            Color  YELLOW/STRAW  07/01/2021 01:30 PM       Appearance  CLEAR  07/01/2021 01:30 PM       Specific gravity  1.010  07/01/2021 01:30 PM       pH (UA)  6.5  07/01/2021 01:30 PM       Protein  TRACE (A)  07/01/2021 01:30 PM       Glucose  Negative  07/01/2021 01:30 PM       Ketone  Negative  07/01/2021 01:30 PM       Bilirubin  Negative  07/01/2021 01:30 PM       Urobilinogen  1.0  07/01/2021 01:30 PM       Nitrites  Negative  07/01/2021 01:30 PM  Leukocyte Esterase  Negative  07/01/2021 01:30 PM       Epithelial cells  FEW  07/01/2021 01:30 PM       Bacteria  Negative  07/01/2021 01:30 PM       WBC  0-4  07/01/2021 01:30 PM            RBC  0-5  07/01/2021 01:30 PM                Medications Reviewed:          Current Facility-Administered Medications          Medication   Dose  Route  Frequency           ?  morphine injection 2 mg   2 mg  IntraVENous  Q3H PRN     ?  docusate sodium (COLACE) capsule 100 mg   100 mg  Oral  BID     ?  diclofenac (VOLTAREN) 1 % topical gel 2 g   2 g  Topical  QID     ?  albuterol-ipratropium (DUO-NEB) 2.5 MG-0.5 MG/3 ML   3 mL  Nebulization  Q6H PRN     ?  hydrALAZINE (APRESOLINE) 20 mg/mL injection 10 mg   10 mg  IntraVENous  Q6H PRN     ?  DULoxetine (CYMBALTA) capsule 20 mg   20 mg  Oral  DAILY     ?  [Held by provider] allopurinoL (ZYLOPRIM) tablet 100 mg   100 mg  Oral  DAILY     ?  colchicine tablet 0.6 mg   0.6 mg  Oral  DAILY     ?  predniSONE (DELTASONE) tablet 40 mg   40 mg  Oral  DAILY WITH BREAKFAST     ?  sodium chloride (NS) flush 5-40 mL   5-40 mL  IntraVENous  Q8H     ?  sodium chloride (NS) flush 5-40 mL   5-40 mL  IntraVENous  PRN     ?  acetaminophen (TYLENOL) tablet 650 mg   650 mg  Oral  Q6H PRN          Or           ?  acetaminophen (TYLENOL) suppository 650 mg   650 mg  Rectal  Q6H PRN           ?  polyethylene glycol (MIRALAX) packet 17 g   17 g  Oral  DAILY PRN     ?  ondansetron (ZOFRAN ODT) tablet 4 mg   4 mg  Oral  Q8H PRN          Or           ?  ondansetron (ZOFRAN) injection 4 mg   4 mg  IntraVENous  Q6H PRN     ?  enoxaparin (LOVENOX) injection 40 mg   40 mg  SubCUTAneous  DAILY     ?  clopidogreL (PLAVIX) tablet 75 mg   75 mg  Oral  DAILY     ?  isosorbide mononitrate ER (IMDUR) tablet 30 mg   30 mg  Oral  DAILY     ?  rosuvastatin (CRESTOR) tablet 40 mg   40 mg  Oral  QHS     ?  naloxone (NARCAN) injection 0.4 mg   0.4 mg  IntraVENous  PRN     ?  cyclobenzaprine (FLEXERIL) tablet 10 mg   10 mg  Oral  TID PRN     ?  metoprolol succinate (TOPROL-XL) XL tablet 25 mg   25 mg  Oral  DAILY           ?  levothyroxine (SYNTHROID) tablet 75 mcg   75 mcg  Oral  6am        ______________________________________________________________________   EXPECTED LENGTH OF STAY: - - -   ACTUAL LENGTH OF STAY:          0                     Ginger Carne, MD

## 2021-07-05 NOTE — Progress Notes (Signed)
1915 Bedside shift change report given to Eucheria,RN (Cabin crew) by Preston Fleeting (offgoing nurse). Report included the following information SBAR, Kardex, Intake/Output, and MAR.      0715 Bedside shift change report given to Franchot Heidelberg (oncoming nurse) by Jamesetta So (offgoing nurse). Report included the following information SBAR, Kardex, Intake/Output, and MAR.

## 2021-07-06 MED ORDER — DICLOFENAC 1 % TOPICAL GEL
1 % | CUTANEOUS | 0 refills | Status: AC | PRN
Start: 2021-07-06 — End: 2021-07-16

## 2021-07-06 MED FILL — ENOXAPARIN 40 MG/0.4 ML SUB-Q SYRINGE: 40 mg/0.4 mL | SUBCUTANEOUS | Qty: 0.4

## 2021-07-06 MED FILL — ROSUVASTATIN 10 MG TAB: 10 mg | ORAL | Qty: 4

## 2021-07-06 MED FILL — LEVOTHYROXINE 50 MCG TAB: 50 mcg | ORAL | Qty: 1

## 2021-07-06 MED FILL — DULOXETINE 20 MG CAP, DELAYED RELEASE: 20 mg | ORAL | Qty: 1

## 2021-07-06 MED FILL — COLCRYS 0.6 MG TABLET: 0.6 mg | ORAL | Qty: 1

## 2021-07-06 MED FILL — LEVOTHYROXINE 25 MCG TAB: 25 mcg | ORAL | Qty: 1

## 2021-07-06 MED FILL — CLOPIDOGREL 75 MG TAB: 75 mg | ORAL | Qty: 1

## 2021-07-06 MED FILL — PREDNISONE 20 MG TAB: 20 mg | ORAL | Qty: 2

## 2021-07-06 MED FILL — ISOSORBIDE MONONITRATE SR 30 MG 24 HR TAB: 30 mg | ORAL | Qty: 1

## 2021-07-06 MED FILL — DOCUSATE SODIUM 100 MG CAP: 100 mg | ORAL | Qty: 1

## 2021-07-06 MED FILL — METOPROLOL SUCCINATE SR 25 MG 24 HR TAB: 25 mg | ORAL | Qty: 1

## 2021-07-06 NOTE — Progress Notes (Signed)
Formatting of this note might be different from the original.  Spiritual Care Partner Volunteer [name] visited Tanya [name] at [facility] in [facility] MED SURG [facility] TELE on 07/06/2021   Documented by:  BSVM Volunteer Axell-Giovanni Komlan   Electronically signed by Audrie Gallus, Axell-Giovanni at 07/06/2021 12:32 PM EST

## 2021-07-06 NOTE — Discharge Summary (Signed)
Formatting of this note is different from the original.  Images from the original note were not included.      Physician Discharge Summary     Patient ID:    Tanya [name]  [id number]  @AGEA @  1941-03-12    Admit date: 07/01/2021    Discharge date and time: 07/06/2021    Hospital Diagnoses and Treatment Rendered:   Tanya [name] is a 81 y.o.  African American female who presents to ED with c/o left leg pain and left knee pain for past couple of days.  Patient states that she was in her usual state of health when she had swelling of her left knee followed by severe pain in her left leg to the point that she cannot ambulate without assistance.  Patient states that she has balance noted and within the back surgeon in [place].  She could not going to surgery given given her cardiac condition.  Endorsed tobacco use denied any excessive alcohol use or recreational drug use.  Denied any chest pain shortness of breath, abdominal pain, difficulty with bowel or bladder.    The patient was admitted to medicine, x-ray of the left knee was positive for joint effusion but negative for septic arthritis due to patient did not have fever or chills, and left knee pain most likely related to degenerative joint disease.  Steroids prednisone p.o. was initiated, colchicine was continued, uric acid level was 5.5, doubt acute gout flare.  Voltaren topical was provided.   Due to dysphagia speech therapy evaluated the patient and recommended the patient would need outpatient evaluation by gastroenterologist due to esophageal dysphagia.  The patient was able to tolerate p.o. diet.  On current medical management condition of patient improved, back pain and left lower extremity pain, left knee pain was better controlled.  Patient was evaluated by physical therapy and Occupational Therapy, she was able to ambulate with front wheel walker, and per physical therapy was recommended to discharge home with home health and physical therapy.  Rest  of hospital stay patient remained hemodynamically stable, afebrile, was able to tolerate p.o. diet, ambulate with front wheel walker and assistance and she was ready to be discharged home.    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  Moderate bilateral L3-L4 and right L4-L5 neuronal foraminal stenosis   -CT lumbar spine multilevel lumbar spondylosis.  No fracture  -CT pelvis without any fracture or bone lesion  -X-ray hip with degenerative joint disease and osteopenia  -Patient states she was offered back surgery but could not get clearance.    -X-ray knee with joint effusion.  No fever or chills or white count does not suspect septic arthritis  -At home on allopurinol and colchicine  -Uric acid level 5.5  Continue:   -PT OT  -Pain management  -MRI L spine to rule out nerve impingement/herniated disc   -Colchicine and prednisone course for acute gout attack  -Not a candidate for NSAID PO due to CKD  Voltaren topical left knee QID  Prednisone 40 mg PO Qday, completed 5 days today, 07/06/2021    History of CAD status post stent to RCA 2003  No chest pain  Continue Plavix, statins, Metoprolol, Imdur    History of hypothyroidism  Continue replacement Levothyroxine    History of hypertension  BP controlled  Continue Imdur, BB    History of CKD 3B  Check CMP    History of gout  colchicine    History of hyperlipidemia  Statins    History of COPD  Controlled  Not requiring supplemental O2  Neb PRN    History of arthritis    History of stroke  -Resume home regimen once confirmed by pharmacy    Constipation  Colace BID  Miralax PRN  Had BM    Dysphagia  ST input appreciated  The patient is able to tolerate PO  Will need follow up  GI as outpatient for further evaluation of esophageal dysphagia      Chronic Diagnoses:    Problem List as of 07/06/2021 Date Reviewed: 12/02/2012            Codes Class Noted - Resolved    Pain of left lower extremity ICD-10-CM: M79.605  ICD-9-CM: 729.5  07/02/2021 - Present      Acute idiopathic gout of left knee ICD-10-CM: M10.062  ICD-9-CM: 274.01  07/02/2021 - Present      Spinal stenosis of lumbar region without neurogenic claudication ICD-10-CM: M48.061  ICD-9-CM: 724.02  07/02/2021 - Present      RESOLVED: Lumbago ICD-10-CM: M54.50  ICD-9-CM: 724.2  07/01/2021 - 07/02/2021      RESOLVED: Chest pain ICD-10-CM: R07.9  ICD-9-CM: 786.50  11/26/2012 - 2012/12/02       Discharge Medications:   Current Discharge Medication List       CONTINUE these medications which have CHANGED    Details   diclofenac (VOLTAREN) 1 % gel Apply  to affected area as needed for Pain (QID as needed) for up to 10 days.  Qty: 1 Each, Refills: 0  Start date: 07/06/2021, End date: 07/16/2021         CONTINUE these medications which have NOT CHANGED    Details   DULoxetine (CYMBALTA) 20 mg capsule Take 20 mg by mouth daily.     metoprolol succinate (TOPROL-XL) 25 mg XL tablet Take 25 mg by mouth daily.     albuterol (PROVENTIL HFA, VENTOLIN HFA, PROAIR HFA) 90 mcg/actuation inhaler Take 1 Puff by inhalation as needed for Wheezing.     levocetirizine (XYZAL) 5 mg tablet Take 5 mg by mouth daily as needed.     rosuvastatin (CRESTOR) 40 mg tablet Take 40 mg by mouth nightly.     isosorbide mononitrate ER (IMDUR) 30 mg tablet Take 30 mg by mouth daily.     LEVOTHYROXINE SODIUM (LEVOTHROID PO) Take 75 mcg by mouth.     clopidogrel (PLAVIX) 75 mg tablet Take  by mouth daily.     allopurinol (ZYLOPRIM) 100 mg tablet Take 100 mg by mouth daily.     colchicine 0.6 mg tablet Take 0.6 mg by mouth daily.         Follow up Care:    1. Bshsi, Not On File, MD in 1-2 weeks.  Please call to set up an appointment shortly after discharge.      Diet:  Regular  Diet    Disposition:  Home with Home health, PT    Advanced Directive:   FULL    DNR X     Discharge Exam:  See today's note.    CONSULTATIONS: None    Significant Diagnostic Studies:   07/01/2021: BUN 13 MG/DL (Ref range:  6 - 20 MG/DL); Calcium 9.2 MG/DL (Ref range: 8.5 - 78.4 MG/DL); CO2 29 mmol/L (Ref range: 21 - 32 mmol/L); Creatinine 1.34 MG/DL (H; Ref range: 6.96 - 2.95 MG/DL); Glucose 121 mg/dL (H; Ref range: 65 - 284 mg/dL); HCT 13.2 % (Ref range: 35.0 - 47.0 %); HGB 14.0 g/dL (Ref range: 44.0 - 10.2 g/dL); Potassium 3.7 mmol/L (Ref range: 3.5 - 5.1 mmol/L); Sodium 140 mmol/L (Ref range: 136 - 145 mmol/L)  No results for input(s): WBC, HGB, HCT, PLT, HGBEXT, HCTEXT, PLTEXT in the last 72 hours.  No results for input(s): NA, K, CL, CO2, BUN, CREA, GLU, CA, MG, PHOS, URICA in the last 72 hours.  No results for input(s): AP, TBIL, TP, ALB, GLOB, GGT, AML, LPSE in the last 72 hours.    No lab exists for component: SGOT, GPT, AMYP, HLPSE  No results for input(s): INR, PTP, APTT, INREXT in the last 72 hours.   No results for input(s): FE, TIBC, PSAT, FERR in the last 72 hours.   No results for input(s): PH, PCO2, PO2 in the last 72 hours.  No results for input(s): CPK, CKMB in the last 72 hours.    No lab exists for component: TROPONINI  No components found for: [facility]    Total time to discharge patient was 31 minutes more than 50% of time was spent for counseling and coordination of care.    Signed:  Ginger Carne, MD  07/06/2021  11:04 AM   Electronically signed by Ginger Carne, MD at 07/06/2021  3:04 PM EST

## 2021-07-06 NOTE — Progress Notes (Signed)
Pharmacist Discharge Medication Reconciliation    Discharge Provider:  Atchikova       Discharge Medications:      My Medications        CHANGE how you take these medications        Instructions Each Dose to Equal Morning Noon Evening Bedtime   diclofenac 1 % Gel  Commonly known as: VOLTAREN  What changed:   when to take this  reasons to take this    Your last dose was:     Your next dose is:         Apply  to affected area as needed for Pain (QID as needed) for up to 10 days.                         CONTINUE taking these medications        Instructions Each Dose to Equal Morning Noon Evening Bedtime   albuterol 90 mcg/actuation inhaler  Commonly known as: PROVENTIL HFA, VENTOLIN HFA, PROAIR HFA    Your last dose was:     Your next dose is:         Take 1 Puff by inhalation as needed for Wheezing.   1 Puff                 allopurinoL 100 mg tablet  Commonly known as: ZYLOPRIM    Your last dose was:     Your next dose is:         Take 100 mg by mouth daily.   100 mg                 clopidogreL 75 mg Tab  Commonly known as: PLAVIX    Your last dose was:     Your next dose is:         Take  by mouth daily.                  colchicine 0.6 mg tablet    Your last dose was:     Your next dose is:         Take 0.6 mg by mouth daily.   0.6 mg                 DULoxetine 20 mg capsule  Commonly known as: CYMBALTA    Your last dose was:     Your next dose is:         Take 20 mg by mouth daily.   20 mg                 isosorbide mononitrate ER 30 mg tablet  Commonly known as: IMDUR    Your last dose was:     Your next dose is:         Take 30 mg by mouth daily.   30 mg                 levocetirizine 5 mg tablet  Commonly known as: XYZAL    Your last dose was:     Your next dose is:         Take 5 mg by mouth daily as needed.   5 mg                 LEVOTHROID PO    Your last dose was:     Your next dose is:  Take 75 mcg by mouth.   75 mcg                 metoprolol succinate 25 mg XL tablet  Commonly known as:  TOPROL-XL    Your last dose was:     Your next dose is:         Take 25 mg by mouth daily.   25 mg                 rosuvastatin 40 mg tablet  Commonly known as: CRESTOR    Your last dose was:     Your next dose is:         Take 40 mg by mouth nightly.   40 mg                           Where to Get Your Medications        These medications were sent to [id number] [place], [place] - [address]  [address], [place] [address]      Phone: [phone]   diclofenac 1 % Gel        The patient's chart, MAR, and AVS were reviewed by   Barrett Shell, PHARMD, BCPS

## 2021-07-06 NOTE — Progress Notes (Signed)
Reason for Admission:    Patient presented to ED with complaint of leg pain and swelling.  Was admitted under Observation status                   RUR Score:       N/A              Plan for utilizing home health:    Home health is recommended for physical therapy      PCP: First and Last name:  Bshsi, Not On File, MD     Name of Practice:    Are you a current patient: Yes/No:    Approximate date of last visit:    Can you participate in a virtual visit with your PCP:                     Current Advanced Directive/Advance Care Plan: DNR      Healthcare Decision Maker:   Click here to complete HealthCare Decision Makers including selection of the Healthcare Decision Maker Relationship (ie "Primary")                         Transition of Care Plan:     CM met with patient at bedside, introduced self/role and verified demographic information.  Patient moved to [place] from [place], [place] about one month ago and CM updated the address and cell phone number that patient provided in the medical record.  She stated that her adult grandson lives with her but is not her caregiver.  Patient reported being independent in ADLs before this medical incident.  Does not have a PCP and reports that she will be receiving primary care services at [facility].  CM contacted [facility] and was told that pt is scheduled for a new patient screening with RN on 07/10/21 but is not their patient yet and will not see PCP until 08/12/21.  A PCP referral is necessary for physical therapy to be arranged.  A virtual PCP appointment is scheduled for 07/07/21 with Henrietta Hoover, NP.  GI appointment is also scheduled and on AVS.    The nearest pharmacy to her home is [facility] on the corner of [address]   Her transportation at discharge will be provided by her grandson.    CM reviewed the MOON document with patient which pt signed.  She was given a copy and a copy placed on chart to be scanned.    Care Management Interventions  PCP Verified by CM:  No (Does not have a PCP)  Palliative Care Criteria Met (RRAT>21 & CHF Dx)?: No  Mode of Transport at Discharge: Other (see comment)  Transition of Care Consult (CM Consult): Discharge Planning, Home Health  Health Maintenance Reviewed: Yes  Physical Therapy Consult: Yes  Occupational Therapy Consult: Yes  Speech Therapy Consult: Yes  Support Systems: Other Family Member(s)  Confirm Follow Up Transport: Self  The Plan for Transition of Care is Related to the Following Treatment Goals : medical stability  The Patient and/or Patient Representative was Provided with a Choice of Provider and Agrees with the Discharge Plan?: Yes  Freedom of Choice List was Provided with Basic Dialogue that Supports the Patient's Individualized Plan of Care/Goals, Treatment Preferences and Shares the Quality Data Associated with the Providers?: No (PT is new to area and does not know providers in [place])  [employer] Provided?: No  Discharge Location  Patient Expects to be  Discharged to:: Home with home health

## 2021-07-06 NOTE — Progress Notes (Signed)
Formatting of this note is different from the original.  Images from the original note were not included.      [facility] Adult  Hospitalist Group               Hospitalist Progress Note  Tanya CarneElena YuSusquehanna Valley Surgery Centerryevna Atchikova, MD  Answering service: [phone] OR [phone] from in house phone      Date of Service:  07/06/2021  NAME:  Tanya [name]  DOB:  23-Mar-1941  MRN:  [id number]  [facility]    Admission Summary:   From HPI: [name] is a 81 y.o.  African American female who presents to ED with c/o left leg pain and left knee pain for past couple of days.  Patient states that she was in her usual state of health when she had swelling of her left knee followed by severe pain in her left leg to the point that she cannot ambulate without assistance.  Patient states that she has balance noted and within the [place].  She could not going to surgery given given her cardiac condition.  Endorsed tobacco use denied any excessive alcohol use or recreational drug use.  Denied any chest pain shortness of breath belly pain difficulty with bowel or bladder      Interval history / Subjective:   NAD, no acute event over night  Back pain controlled, and left knee pain improved after Voltaren topical provided  PT and OT input appreciated, pt is able to ambulate with FWW  Discussed with OT  Discussed with ST, input appreciated  Pt able to tolerate PO diet  Per ST recommended follow up GI as outpatient for possible esophageal dysphagia  Discussed with case management plan for disposition     Assessment & Plan:     Anticipated discharge date : TBD  Anticipated disposition : possible home with HH, PT  Barriers to discharge : PT reevaluation      Left leg pain, DJD, acute POA, improved  Left knee pain and swelling, acute POA, improved  Suspected acute on chronic gout attack POA, less likely  Possible acute on chronic pain related to DJD  Left knee effusion  Severe degenerative disk disease of  lumbar spine POA  -MRI lumbar spine with critical at L4-L5 severe L3-L4 moderate L2 and L3.  Moderate bilateral L3-L4 and right L4-L5 neuronal foraminal stenosis   -CT lumbar spine multilevel lumbar spondylosis.  No fracture  -CT pelvis without any fracture or bone lesion  -X-ray hip with degenerative joint disease and osteopenia  -Patient states she was offered back surgery but could not get clearance.    -X-ray knee with joint effusion.  No fever or chills or white count does not suspect septic arthritis  -At home on allopurinol and colchicine  -Uric acid level 5.5  Continue:   -PT OT  -Pain management  -MRI L spine to rule out nerve impingement/herniated disc   -Colchicine and prednisone course for acute gout attack  -Not a candidate for NSAID PO due to CKD  Voltaren topical left knee QID  Prednisone 40 mg PO Qday, completed 5 days today    History of CAD status post stent to RCA 2003  No chest pain  Continue Plavix, statins, Metoprolol, Imdur    History of hypothyroidism  Continue replacement Levothyroxine    History of hypertension  BP controlled  Continue Imdur, BB    History of CKD 3B  Check CMP  History of gout  colchicine    History of hyperlipidemia  Statins    History of COPD  Controlled  Not requiring supplemental O2  Neb PRN    History of arthritis    History of stroke  -Resume home regimen once confirmed by pharmacy    Constipation  Colace BID  Miralax PRN  Had BM    Dysphagia  ST input appreciated  Th patient is able to tolerate PO  Will need follow up GI as outpatient for further evaluation of esophageal dysphagia    Body mass index is 39.06 kg/m.  Code Status: DNR  DVT Prophylaxis: Lovenox  Care Plan discussed with: patient and RN  Anticipated Disposition: home today, pending PT evaluation,       Hospital Problems  Date Reviewed: 08-Dec-2012            Codes Class Noted POA    Pain of left lower extremity ICD-10-CM: M79.605  ICD-9-CM: 729.5  07/02/2021 Unknown      Acute idiopathic gout of left  knee ICD-10-CM: M10.062  ICD-9-CM: 274.01  07/02/2021 Unknown      Spinal stenosis of lumbar region without neurogenic claudication ICD-10-CM: M48.061  ICD-9-CM: 724.02  07/02/2021 Unknown       Review of Systems:   A comprehensive review of systems was negative except for that written in the HPI.     Vital Signs:    Last 24hrs VS reviewed since prior progress note. Most recent are:  Visit Vitals  BP (!) 149/76 (BP 1 Location: Left upper arm, BP Patient Position: At rest;Semi fowlers)   Pulse 68   Temp 98.6 F (37 C)   Resp 18   Ht 5' (1.524 m)   Wt 90.7 kg (200 lb)   SpO2 95%   BMI 39.06 kg/m     Intake/Output Summary (Last 24 hours) at 07/06/2021 9518  Last data filed at 07/06/2021 8416  Gross per 24 hour   Intake 240 ml   Output 600 ml   Net -360 ml         Physical Examination:     I had a face to face encounter with this patient and independently examined them on 07/06/2021 as outlined below:      Constitutional:  No acute distress, cooperative, pleasant, sitting in chair   ENT:  Oral mucosa moist, oropharynx benign.    Resp:  Coarse bilaterally, scattered wheezing, no rhonchi/rales. No accessory muscle use.    CV:  Regular rhythm, normal rate, no gallops, rubs    GI:  Soft, non distended, non tender. normoactive bowel sounds, no hepatosplenomegaly     Musculoskeletal:  No edema, warm, 2+ pulses throughout    Neurologic:  Able to move all extremities,  AAOx3, CN II-XII reviewed, left knee edema improved         Data Review:    Review and/or order of clinical lab test    MRI spine:  FINDINGS:    There is 4.6 mm anterolisthesis of L4 relative to L5 without associated  spondylolysis. Vertebral body heights are maintained. Marrow signal is mildly  heterogeneous without a discrete lesion.    The conus medullaris terminates at L1. Signal and caliber of the distal spinal  cord are within normal limits.     The paraspinal soft tissues are within normal limits.    Lower thoracic spine: Disc bulges at T10-11, T11-12, and  T12-L1 No herniation or  stenosis.    L1-L2: No herniation or stenosis.  L2-L3: Mild disc bulge, in combination with the bilateral facet arthropathy and  ligamentum flavum hypertrophy, results in central canal diameter of 7.5 mm.  There is mild bilateral neural foraminal stenosis (series 5, image 8).    L3-L4: Diffuse disc bulge, in combination with the bilateral facet arthropathy  and ligamentum flavum hypertrophy. The central canal measures 5.7 mm anterior to  posterior. Moderate bilateral neural foraminal stenosis (series 5, image 11).    L4-L5: Broad disc bulge, in combination with the moderate bilateral ligamentum  flavum hypertrophy and severe bilateral facet arthropathy. The central canal is  triangulated to about 2 to 3 mm (series 5, image 14). Moderate right and  mild-to-moderate left neural foraminal stenosis.    L5-S1: Broad disc bulge. Bilateral facet arthropathy and ligamentum flavum  hypertrophy. No significant central canal stenosis. Mild bilateral neural  foraminal stenosis. (Series 5, image 17).    IMPRESSION  Spinal stenosis, Critical at L4-5, severe at L3-4, and moderate at L2-3.  Moderate bilateral L3-4 and right L4-5 neural foraminal stenosis  Grade 1 anterolisthesis of L4 on a degenerative basis    XR left knee:  FINDINGS: 4 of the left knee demonstrate no fracture or other acute osseous or  articular abnormality. There is a joint effusion. There is moderate  tricompartmental DJD. Vascular calcifications are noted.    IMPRESSION  Joint effusion. DJD. No evidence of fracture.    Labs:     No results for input(s): WBC, HGB, HCT, PLT, HGBEXT, HCTEXT, PLTEXT, HGBEXT, HCTEXT, PLTEXT in the last 72 hours.    No results for input(s): NA, K, CL, CO2, BUN, CREA, GLU, CA, MG, PHOS, URICA in the last 72 hours.    No results for input(s): ALT, AP, TBIL, TBILI, TP, ALB, GLOB, GGT, AML, LPSE in the last 72 hours.    No lab exists for component: SGOT, GPT, AMYP, HLPSE    No results for input(s): INR, PTP,  APTT, INREXT, INREXT in the last 72 hours.   No results for input(s): FE, TIBC, PSAT, FERR in the last 72 hours.   No results found for: FOL, RBCF   No results for input(s): PH, PCO2, PO2 in the last 72 hours.  No results for input(s): CPK, CKNDX, TROIQ in the last 72 hours.    No lab exists for component: CPKMB  Lab Results   Component Value Date/Time    Cholesterol, total 162 11/27/2012 01:35 AM    HDL Cholesterol 59 11/27/2012 01:35 AM    LDL, calculated 74.4 11/27/2012 01:35 AM    Triglyceride 143 11/27/2012 01:35 AM    CHOL/HDL Ratio 2.7 11/27/2012 01:35 AM     No results found for: [facility]  Lab Results   Component Value Date/Time    Color YELLOW/STRAW 07/01/2021 01:30 PM    Appearance CLEAR 07/01/2021 01:30 PM    Specific gravity 1.010 07/01/2021 01:30 PM    pH (UA) 6.5 07/01/2021 01:30 PM    Protein TRACE (A) 07/01/2021 01:30 PM    Glucose Negative 07/01/2021 01:30 PM    Ketone Negative 07/01/2021 01:30 PM    Bilirubin Negative 07/01/2021 01:30 PM    Urobilinogen 1.0 07/01/2021 01:30 PM    Nitrites Negative 07/01/2021 01:30 PM    Leukocyte Esterase Negative 07/01/2021 01:30 PM    Epithelial cells FEW 07/01/2021 01:30 PM    Bacteria Negative 07/01/2021 01:30 PM    WBC 0-4 07/01/2021 01:30 PM    RBC 0-5 07/01/2021 01:30 PM     Medications Reviewed:  Current Facility-Administered Medications   Medication Dose Route Frequency    morphine injection 2 mg  2 mg IntraVENous Q3H PRN    docusate sodium (COLACE) capsule 100 mg  100 mg Oral BID    diclofenac (VOLTAREN) 1 % topical gel 2 g  2 g Topical QID    albuterol-ipratropium (DUO-NEB) 2.5 MG-0.5 MG/3 ML  3 mL Nebulization Q6H PRN    hydrALAZINE (APRESOLINE) 20 mg/mL injection 10 mg  10 mg IntraVENous Q6H PRN    DULoxetine (CYMBALTA) capsule 20 mg  20 mg Oral DAILY    [Held by provider] allopurinoL (ZYLOPRIM) tablet 100 mg  100 mg Oral DAILY    colchicine tablet 0.6 mg  0.6 mg Oral DAILY    sodium chloride (NS) flush 5-40 mL  5-40 mL IntraVENous Q8H    sodium  chloride (NS) flush 5-40 mL  5-40 mL IntraVENous PRN    acetaminophen (TYLENOL) tablet 650 mg  650 mg Oral Q6H PRN    Or    acetaminophen (TYLENOL) suppository 650 mg  650 mg Rectal Q6H PRN    polyethylene glycol (MIRALAX) packet 17 g  17 g Oral DAILY PRN    ondansetron (ZOFRAN ODT) tablet 4 mg  4 mg Oral Q8H PRN    Or    ondansetron (ZOFRAN) injection 4 mg  4 mg IntraVENous Q6H PRN    enoxaparin (LOVENOX) injection 40 mg  40 mg SubCUTAneous DAILY    clopidogreL (PLAVIX) tablet 75 mg  75 mg Oral DAILY    isosorbide mononitrate ER (IMDUR) tablet 30 mg  30 mg Oral DAILY    rosuvastatin (CRESTOR) tablet 40 mg  40 mg Oral QHS    naloxone (NARCAN) injection 0.4 mg  0.4 mg IntraVENous PRN    cyclobenzaprine (FLEXERIL) tablet 10 mg  10 mg Oral TID PRN    metoprolol succinate (TOPROL-XL) XL tablet 25 mg  25 mg Oral DAILY    levothyroxine (SYNTHROID) tablet 75 mcg  75 mcg Oral 6am     ______________________________________________________________________  EXPECTED LENGTH OF STAY: - - -  ACTUAL LENGTH OF STAY:          0      Elena Tretha Sciara, MD    Electronically signed by Tanya Carne, MD at 07/06/2021 10:57 AM EST

## 2021-07-06 NOTE — Progress Notes (Signed)
Formatting of this note might be different from the original.  0730: Report from [place], Charity fundraiser. Pt resting comfortably in bed. Overnight events reviewed.    0915: Assessment complete, meds given. Pt states she is ready to leave today, feeling much better.     1030: IDR complete. Pt to d/c today.    1415: Awaiting discharge transportation.    1600: IV removed and tele discontinued. Discharge teaching completed at bedside with pt and her nephew. Follow-up appointments outlined and prescriptions reviewed. Pt discharged to main entrance via wheelchair for discharge to private vehicle.  Electronically signed by Stephanie Acre, RN at 07/06/2021  4:22 PM EST

## 2021-07-06 NOTE — Progress Notes (Signed)
Spiritual Care Partner Volunteer [name] visited Ms. Tanya [name] at [facility] in [facility] 2 MED SURG & TELE on 07/06/2021   Documented by:  BSVM Volunteer Axell-Giovanni Audrie Gallus

## 2021-07-06 NOTE — Progress Notes (Signed)
Formatting of this note might be different from the original.  Reason for Admission:    Patient presented to ED with complaint of leg pain and swelling.  Was admitted under Observation status    RUR Score:       N/A       Plan for utilizing home health:    Home health is recommended for physical therapy      PCP: First and Last name:  Bshsi, Not On File, MD     Name of Practice:    Are you a current patient: Yes/No:    Approximate date of last visit:    Can you participate in a virtual visit with your PCP:     Current Advanced Directive/Advance Care Plan: DNR    Healthcare Decision Maker:   Click here to complete HealthCare Decision Makers including selection of the Healthcare Decision Maker Relationship (ie "Primary")      Transition of Care Plan:     CM met with patient at bedside, introduced self/role and verified demographic information.  Patient moved to [place] from [place], [place] about one month ago and CM updated the address and cell phone number that patient provided in the medical record.  She stated that her adult grandson lives with her but is not her caregiver.  Patient reported being independent in ADLs before this medical incident.  Does not have a PCP and reports that she will be receiving primary care services at [facility].  CM contacted [facility] and was told that pt is scheduled for a new patient screening with RN on 07/10/21 but is not their patient yet and will not see PCP until 08/12/21.  A PCP referral is necessary for physical therapy to be arranged.  A virtual PCP appointment is scheduled for 07/07/21 with Henrietta Hoover, NP.  GI appointment is also scheduled and on AVS.    The nearest pharmacy to her home is [facility] on the corner of [address]   Her transportation at discharge will be provided by her grandson.    CM reviewed the MOON document with patient which pt signed.  She was given a copy and a copy placed on chart to be scanned.    Care Management Interventions  PCP Verified by  CM: No (Does not have a PCP)  Palliative Care Criteria Met (RRAT>21 & CHF Dx)?: No  Mode of Transport at Discharge: Other (see comment)  Transition of Care Consult (CM Consult): Discharge Planning, Home Health  Health Maintenance Reviewed: Yes  Physical Therapy Consult: Yes  Occupational Therapy Consult: Yes  Speech Therapy Consult: Yes  Support Systems: Other Family Member(s)  Confirm Follow Up Transport: Self  The Plan for Transition of Care is Related to the Following Treatment Goals : medical stability  The Patient and/or Patient Representative was Provided with a Choice of Provider and Agrees with the Discharge Plan?: Yes  Freedom of Choice List was Provided with Basic Dialogue that Supports the Patient's Individualized Plan of Care/Goals, Treatment Preferences and Shares the Quality Data Associated with the Providers?: No (PT is new to area and does not know providers in [place])  [employer] Information Provided?: No  Discharge Location  Patient Expects to be Discharged to:: Home with home health     Electronically signed by Clois Comber E at 07/06/2021  2:42 PM EST

## 2021-07-06 NOTE — Progress Notes (Signed)
0730: Report from [place], Charity fundraiser. Pt resting comfortably in bed. Overnight events reviewed.    0915: Assessment complete, meds given. Pt states she is ready to leave today, feeling much better.     1030: IDR complete. Pt to d/c today.    1415: Awaiting discharge transportation.    1600: IV removed and tele discontinued. Discharge teaching completed at bedside with pt and her nephew. Follow-up appointments outlined and prescriptions reviewed. Pt discharged to main entrance via wheelchair for discharge to private vehicle.

## 2021-07-06 NOTE — Progress Notes (Signed)
Progress Notes by Ginger Carne, MD at 07/06/21 [phone]                Author: Ginger Carne, MD  Service: Hospitalist  Author Type: Physician       Filed: 07/06/21 1057  Date of Service: 07/06/21 0958  Status: Addendum          Editor: Ginger Carne, MD (Physician)          Related Notes: Original Note by Ginger Carne, MD (Physician) filed at 07/06/21  1002                             [facility]ist [facility]                                                                                              Hospitalist Progress Note   Ginger Carne, MD   Answering service: [phone] from in house phone            Date of Service:  07/06/2021   NAME:  Tanya [name]   DOB:  1940/11/18   MRN:  [id number]   Room: [facility]        Admission Summary:        From HPI: Tanya [name] is a 81 y.o.  African American female who presents to ED with c/o left  leg pain and left knee pain for past couple of days.  Patient states that she was in her usual state of health when she had swelling of her left knee followed by severe pain in her left leg to the point that she cannot ambulate without assistance.  Patient  states that she has balance noted and within the back surgeon in [place].  She could not going to surgery given given her cardiac condition.  Endorsed tobacco use denied any excessive alcohol use or recreational drug use.  Denied any chest pain  shortness of breath belly pain difficulty with bowel or bladder          Interval history / Subjective:        NAD, no acute event over night   Back pain controlled, and left knee pain improved after Voltaren topical provided   PT and OT input appreciated, pt is able to ambulate with FWW   Discussed with OT   Discussed with ST, input appreciated   Pt able to tolerate PO diet   Per ST recommended follow up GI as outpatient for possible esophageal dysphagia   Discussed with  case management plan for disposition          Assessment & Plan:          Anticipated discharge date : TBD   Anticipated disposition : possible home with HH, PT   Barriers to discharge : PT reevaluation            Left leg pain, DJD, acute POA, improved   Left knee pain and swelling, acute POA, improved   Suspected acute on chronic  gout attack POA, less likely   Possible acute on chronic pain related to DJD   Left knee effusion   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  Moderate bilateral L3-L4 and right L4-L5 neuronal foraminal stenosis    -CT lumbar spine multilevel lumbar spondylosis.  No fracture   -CT pelvis without any fracture or bone lesion   -X-ray hip with degenerative joint disease and osteopenia   -Patient states she was offered back surgery but could not get clearance.     -X-ray knee with joint effusion.  No fever or chills or white count does not suspect septic arthritis   -At home on allopurinol and colchicine   -Uric acid level 5.5   Continue:    -PT OT   -Pain management   -MRI L spine to rule out nerve impingement/herniated disc    -Colchicine and prednisone course for acute gout attack   -Not a candidate for NSAID PO due to CKD   Voltaren topical left knee QID   Prednisone 40 mg PO Qday, completed 5 days today       History of CAD status post stent to RCA 2003   No chest pain   Continue Plavix, statins, Metoprolol, Imdur      History of hypothyroidism   Continue replacement Levothyroxine      History of hypertension   BP controlled   Continue Imdur, BB      History of CKD 3B   Check CMP      History of gout   colchicine      History of hyperlipidemia   Statins      History of COPD   Controlled   Not requiring supplemental O2   Neb PRN      History of arthritis      History of stroke   -Resume home regimen once confirmed by pharmacy      Constipation   Colace BID   Miralax PRN   Had BM      Dysphagia   ST  input appreciated   Th patient is able to tolerate PO   Will need follow up GI as outpatient for further evaluation of esophageal dysphagia       Body mass index is 39.06 kg/m??.   Code Status: DNR   DVT Prophylaxis: Lovenox   Care Plan discussed with: patient and RN   Anticipated Disposition: home today, pending PT evaluation,               Hospital Problems   Date Reviewed: 11/28/2012                            Codes  Class  Noted  POA              Pain of left lower extremity  ICD-10-CM: M79.605   ICD-9-CM: 729.5    07/02/2021  Unknown                        Acute idiopathic gout of left knee  ICD-10-CM: M10.062   ICD-9-CM: 274.01    07/02/2021  Unknown                        Spinal stenosis of lumbar region without neurogenic claudication  ICD-10-CM: M48.061   ICD-9-CM: 724.02  07/02/2021  Unknown                    Review of Systems:     A comprehensive review of systems was negative except for that written in the HPI.            Vital Signs:      Last 24hrs VS reviewed since prior progress note. Most recent are:   Visit Vitals      BP  (!) 149/76 (BP 1 Location: Left upper arm, BP Patient Position: At rest;Semi fowlers)     Pulse  68     Temp  98.6 ??F (37 ??C)     Resp  18     Ht  5' (1.524 m)     Wt  90.7 kg (200 lb)     SpO2  95%        BMI  39.06 kg/m??              Intake/Output Summary (Last 24 hours) at 07/06/2021 0958   Last data filed at 07/06/2021 [id number]     Gross per 24 hour        Intake  240 ml        Output  600 ml        Net  -360 ml                 Physical Examination:        I had a face to face encounter with this patient and independently examined them on 07/06/2021 as outlined below:                Constitutional:   No acute distress, cooperative, pleasant, sitting in chair     ENT:   Oral mucosa moist, oropharynx benign.      Resp:   Coarse bilaterally, scattered wheezing, no rhonchi/rales. No accessory muscle use.      CV:   Regular rhythm, normal rate, no gallops, rubs      GI:   Soft, non  distended, non tender. normoactive bowel sounds, no hepatosplenomegaly       Musculoskeletal:   No edema, warm, 2+ pulses throughout         Neurologic:   Able to move all extremities,  AAOx3, CN II-XII reviewed, left knee edema improved                      Data Review:      Review and/or order of clinical lab test      MRI spine:   FINDINGS:       There is 4.6 mm anterolisthesis of L4 relative to L5 without associated   spondylolysis. Vertebral body heights are maintained. Marrow signal is mildly   heterogeneous without a discrete lesion.       The conus medullaris terminates at L1. Signal and caliber of the distal spinal   cord are within normal limits.        The paraspinal soft tissues are within normal limits.       Lower thoracic spine: Disc bulges at T10-11, T11-12, and T12-L1 No herniation or   stenosis.       L1-L2: No herniation or stenosis.       L2-L3: Mild disc bulge, in combination with the bilateral facet arthropathy and   ligamentum flavum hypertrophy, results in central canal diameter of 7.5 mm.   There is mild bilateral neural foraminal stenosis (series 5,  image 8).       L3-L4: Diffuse disc bulge, in combination with the bilateral facet arthropathy   and ligamentum flavum hypertrophy. The central canal measures 5.7 mm anterior to   posterior. Moderate bilateral neural foraminal stenosis (series 5, image 11).       L4-L5: Broad disc bulge, in combination with the moderate bilateral ligamentum   flavum hypertrophy and severe bilateral facet arthropathy. The central canal is   triangulated to about 2 to 3 mm (series 5, image 14). Moderate right and   mild-to-moderate left neural foraminal stenosis.       L5-S1: Broad disc bulge. Bilateral facet arthropathy and ligamentum flavum   hypertrophy. No significant central canal stenosis. Mild bilateral neural   foraminal stenosis. (Series 5, image 17).       IMPRESSION   Spinal stenosis, Critical at L4-5, severe at L3-4, and moderate at L2-3.   Moderate  bilateral L3-4 and right L4-5 neural foraminal stenosis   Grade 1 anterolisthesis of L4 on a degenerative basis      XR left knee:   FINDINGS: 4 of the left knee demonstrate no fracture or other acute osseous or   articular abnormality. There is a joint effusion. There is moderate   tricompartmental DJD. Vascular calcifications are noted.       IMPRESSION   Joint effusion. DJD. No evidence of fracture.        Labs:        No results for input(s): WBC, HGB, HCT, PLT, HGBEXT, HCTEXT, PLTEXT, HGBEXT, HCTEXT, PLTEXT in the last 72 hours.      No results for input(s): NA, K, CL, CO2, BUN, CREA, GLU, CA, MG, PHOS, URICA in the last 72 hours.      No results for input(s): ALT, AP, TBIL, TBILI, TP, ALB, GLOB, GGT, AML, LPSE in the last 72 hours.      No lab exists for component: SGOT, GPT, AMYP, HLPSE      No results for input(s): INR, PTP, APTT, INREXT, INREXT in the last 72 hours.    No results for input(s): FE, TIBC, PSAT, FERR in the last 72 hours.    No results found for: FOL, RBCF    No results for input(s): PH, PCO2, PO2 in the last 72 hours.   No results for input(s): CPK, CKNDX, TROIQ in the last 72 hours.      No lab exists for component: CPKMB     Lab Results         Component  Value  Date/Time            Cholesterol, total  162  11/27/2012 01:35 AM       HDL Cholesterol  59  11/27/2012 01:35 AM       LDL, calculated  74.4  11/27/2012 01:35 AM       Triglyceride  143  11/27/2012 01:35 AM            CHOL/HDL Ratio  2.7  11/27/2012 01:35 AM        No results found for: [facility]     Lab Results         Component  Value  Date/Time            Color  YELLOW/STRAW  07/01/2021 01:30 PM            Appearance  CLEAR  07/01/2021 01:30 PM            Specific gravity  1.010  07/01/2021  01:30 PM       pH (UA)  6.5  07/01/2021 01:30 PM       Protein  TRACE (A)  07/01/2021 01:30 PM       Glucose  Negative  07/01/2021 01:30 PM       Ketone  Negative  07/01/2021 01:30 PM       Bilirubin  Negative  07/01/2021 01:30 PM        Urobilinogen  1.0  07/01/2021 01:30 PM       Nitrites  Negative  07/01/2021 01:30 PM       Leukocyte Esterase  Negative  07/01/2021 01:30 PM       Epithelial cells  FEW  07/01/2021 01:30 PM       Bacteria  Negative  07/01/2021 01:30 PM       WBC  0-4  07/01/2021 01:30 PM            RBC  0-5  07/01/2021 01:30 PM                Medications Reviewed:          Current Facility-Administered Medications          Medication  Dose  Route  Frequency           ?  morphine injection 2 mg   2 mg  IntraVENous  Q3H PRN     ?  docusate sodium (COLACE) capsule 100 mg   100 mg  Oral  BID     ?  diclofenac (VOLTAREN) 1 % topical gel 2 g   2 g  Topical  QID     ?  albuterol-ipratropium (DUO-NEB) 2.5 MG-0.5 MG/3 ML   3 mL  Nebulization  Q6H PRN     ?  hydrALAZINE (APRESOLINE) 20 mg/mL injection 10 mg   10 mg  IntraVENous  Q6H PRN     ?  DULoxetine (CYMBALTA) capsule 20 mg   20 mg  Oral  DAILY     ?  [Held by provider] allopurinoL (ZYLOPRIM) tablet 100 mg   100 mg  Oral  DAILY     ?  colchicine tablet 0.6 mg   0.6 mg  Oral  DAILY     ?  sodium chloride (NS) flush 5-40 mL   5-40 mL  IntraVENous  Q8H     ?  sodium chloride (NS) flush 5-40 mL   5-40 mL  IntraVENous  PRN     ?  acetaminophen (TYLENOL) tablet 650 mg   650 mg  Oral  Q6H PRN          Or           ?  acetaminophen (TYLENOL) suppository 650 mg   650 mg  Rectal  Q6H PRN     ?  polyethylene glycol (MIRALAX) packet 17 g   17 g  Oral  DAILY PRN     ?  ondansetron (ZOFRAN ODT) tablet 4 mg   4 mg  Oral  Q8H PRN          Or           ?  ondansetron (ZOFRAN) injection 4 mg   4 mg  IntraVENous  Q6H PRN     ?  enoxaparin (LOVENOX) injection 40 mg   40 mg  SubCUTAneous  DAILY     ?  clopidogreL (PLAVIX) tablet 75 mg   75 mg  Oral  DAILY     ?  isosorbide mononitrate ER (IMDUR) tablet 30 mg   30 mg  Oral  DAILY     ?  rosuvastatin (CRESTOR) tablet 40 mg   40 mg  Oral  QHS     ?  naloxone (NARCAN) injection 0.4 mg   0.4 mg  IntraVENous  PRN     ?  cyclobenzaprine (FLEXERIL) tablet 10 mg   10  mg  Oral  TID PRN     ?  metoprolol succinate (TOPROL-XL) XL tablet 25 mg   25 mg  Oral  DAILY           ?  levothyroxine (SYNTHROID) tablet 75 mcg   75 mcg  Oral  6am        ______________________________________________________________________   EXPECTED LENGTH OF STAY: - - -   ACTUAL LENGTH OF STAY:          0                    Ginger CarneElena Yuryevna Carletha Dawn, MD

## 2021-07-06 NOTE — Progress Notes (Signed)
Formatting of this note is different from the original.  OCCUPATIONAL THERAPY EVALUATION/DISCHARGE  Patient: Tanya [name] ([age] y.o. female)  Date: 07/06/2021  Primary Diagnosis: Lumbago [M54.50]    Precautions: Fall, WBAT    ASSESSMENT  Based on the objective data described below, the patient presents with good safety awareness, no LOB and at an overall SBA and set-up level with LE ADLs, toileting and functional mobility amb with RW.  Pt states she is abck to her baseline and her L knee pain has significantly improved.  No further skilled OT required at this time.    Current Level of Function (ADLs/self-care): SBA and set-up level with LE ADLs, toileting and functional mobility amb with RW    Functional Outcome Measure: The patient scored 50/100 on the Barthel Index outcome measure.      Other factors to consider for discharge: see above     PLAN :  Recommend with staff: up with assist for safety    Recommendation for discharge: (in order for the patient to meet his/her long term goals)  No skilled occupational therapy/ follow up rehabilitation needs identified at this time.    This discharge recommendation:  Has been made in collaboration with the attending provider and/or case management    IF patient discharges home will need the following DME: patient owns DME required for discharge     SUBJECTIVE:   Patient stated ?I am much better.?    OBJECTIVE DATA SUMMARY:   HISTORY:   Past Medical History:   Diagnosis Date    Acid reflux     Asthma     CAD (coronary artery disease)     Hypertension      Past Surgical History:   Procedure Laterality Date    HX ORTHOPAEDIC      right knee replacement    PR UNLISTED PROCEDURE CARDIAC SURGERY      Stents     Prior Level of Function/Environment/Context: Mod I using RW; grandson provides transportation  Expanded or extensive additional review of patient history:   Home Situation  Home Environment: Apartment  # Steps to Enter: 3  Rails to Enter: Yes  Hand Rails : Bilateral (too  far to hold both)  One/Two Story Residence: One story  Living Alone: No  Support Systems: Other Family Member(s) (pt's grandson)  Patient Expects to be Discharged to:: Home  Current DME Used/Available at Home: Cane, straight, Commode, bedside, Walker, rolling  Tub or Shower Type: Tub/Shower combination    Hand dominance: Right    EXAMINATION OF PERFORMANCE DEFICITS:  Cognitive/Behavioral Status:  Neurologic State: Appropriate for age;Alert  Orientation Level: Oriented X4  Cognition: Appropriate decision making;Appropriate for age attention/concentration;Appropriate safety awareness;Follows commands  Perception: Appears intact  Perseveration: No perseveration noted  Safety/Judgement: Awareness of environment;Fall prevention;Insight into deficits    Hearing:  Auditory  Auditory Impairment: None    Vision/Perceptual:    Acuity: Able to read clock/calendar on wall without difficulty;Able to read employee name badge without difficulty    Corrective Lenses: Reading glasses    Range of Motion:  AROM: Generally decreased, functional  PROM: Generally decreased, functional                Strength:  Strength: Generally decreased, functional            Coordination:    Fine Motor Skills-Upper: Left Intact;Right Intact    Gross Motor Skills-Upper: Left Intact;Right Intact    Balance:  Sitting: Intact  Standing: Intact;With support  Functional Mobility and Transfers for ADLs:  Bed Mobility:  Rolling: Modified independent  Supine to Sit: Modified independent  Sit to Supine: Modified independent  Scooting: Modified independent    Transfers:  Sit to Stand: Stand-by assistance  Stand to Sit: Stand-by assistance  Bed to Chair: Stand-by assistance  Bathroom Mobility: Stand-by assistance  Toilet Transfer : Stand-by assistance  Assistive Device : Walker, rolling    ADL Assessment and Intervention:  Feeding: Independent    Oral Facial Hygiene/Grooming: Stand-by assistance (standing at sink)    Bathing: Stand-by assistance;Additional  time;Assist x1 (seated)    Type of Bath: Chlorhexidine (CHG);Bath Pack;Full    Upper Body Dressing: Setup    Lower Body Dressing: Stand-by assistance;Additional time (crossed leg technique to reach feet)    Toileting: Stand by assistance    Patient instructed and indicated understanding energy conservation techniques to increase independence and safety during ADLs.     Cognitive Retraining  Safety/Judgement: Awareness of environment;Fall prevention;Insight into deficits    Functional Measure:    Barthel Index:  Bathing: 0  Bladder: 5  Bowels: 10  Grooming: 5  Dressing: 5  Feeding: 10  Mobility: 0  Stairs: 0  Toilet Use: 5  Transfer (Bed to Chair and Back): 10  Total: 50/100     The Barthel ADL Index: Guidelines  1. The index should be used as a record of what a patient does, not as a record of what a patient could do.  2. The main aim is to establish degree of independence from any help, physical or verbal, however minor and for whatever reason.  3. The need for supervision renders the patient not independent.  4. A patient's performance should be established using the best available evidence. Asking the patient, friends/relatives and nurses are the usual sources, but direct observation and common sense are also important. However direct testing is not needed.  5. Usually the patient's performance over the preceding 24-48 hours is important, but occasionally longer periods will be relevant.  6. Middle categories imply that the patient supplies over 50 per cent of the effort.  7. Use of aids to be independent is allowed.    Score Interpretation (from Sinoff 1997)   80-100 Independent   60-79 Minimally independent   40-59 Partially dependent   20-39 Very dependent   <20 Totally dependent     -Mahoney, F.l., Barthel, D.W. (1965). Functional evaluation: the Barthel Index. [place] [address] [place] (14)2.  -Sinoff, G., Ore, L. (1997). The Barthel activities of daily living index: self-reporting versus actual performance in the old  (> or = 75 years). Journal of American Geriatric Society 45(7), [phone].   -Kenn File Jersey, J.J.M.F, Noel Christmas., Imagene Gurney. (1999). Measuring the change in disability after inpatient rehabilitation; comparison of the responsiveness of the Barthel Index and Functional Independence Measure. Journal of Neurology, Neurosurgery, and Psychiatry, 66(4), [phone].  [name] Bills, N.J.A, Scholte op [place],  W.J.M, & Koopmanschap, M.A. (2004) Assessment of post-stroke quality of life in cost-effectiveness studies: The usefulness of the Barthel Index and the EuroQoL-5D. Quality of Life Research, 92, 361-44       Occupational Therapy Evaluation Charge Determination   History Examination Decision-Making   LOW Complexity : Brief history review  MEDIUM Complexity : 3-5 performance deficits relating to physical, cognitive , or psychosocial skils that result in activity limitations and / or participation restrictions MEDIUM Complexity : Patient may present with comorbidities that affect occupational performnce. Miniml to moderate modification of tasks or  assistance (eg, physical or verbal ) with assesment(s) is necessary to enable patient to complete evaluation      Based on the above components, the patient evaluation is determined to be of the following complexity level: LOW   Pain Rating:  4/10    Activity Tolerance:   Fair and requires rest breaks    After treatment patient left in no apparent distress:    Sitting in chair and Call bell within reach    COMMUNICATION/EDUCATION:   The patient?s plan of care was discussed with: Physical therapist, Registered nurse, and Physician.     Thank you for this referral.  Lucretia Field, OT  Time Calculation: 20 mins   Electronically signed by Charlane Ferretti, Lauren, OT at 07/06/2021 11:57 AM EST

## 2021-07-06 NOTE — Progress Notes (Signed)
Formatting of this note is different from the original.    Problem: Mobility Impaired (Adult and Pediatric)  Goal: *Acute Goals and Plan of Care (Insert Text)  Description: FUNCTIONAL STATUS PRIOR TO ADMISSION: Patient was modified independent using a rolling walker for functional mobility.    HOME SUPPORT PRIOR TO ADMISSION: The patient lived alone with family to provide assistance.    Physical Therapy Goals  Initiated 07/02/2021  1.  Patient will move from supine to sit and sit to supine  in bed with minimal assistance/contact guard assist within 7 day(s).    2.  Patient will transfer from bed to chair and chair to bed with minimal assistance/contact guard assist using the least restrictive device within 7 day(s).  3.  Patient will perform sit to stand with minimal assistance/contact guard assist within 7 day(s).  4.  Patient will ambulate with minimal assistance/contact guard assist for 25 feet with the least restrictive device within 7 day(s).   5.  Patient will ascend/descend 3 stairs with unilateral handrail(s) with moderate assistance  within 7 day(s).   Outcome: Progressing Towards Goal   PHYSICAL THERAPY TREATMENT  Patient: Tanya [name] (81 y.o. female)  Date: 07/06/2021  Diagnosis: Lumbago [M54.50] <principal problem not specified>    Precautions: Fall, WBAT  Chart, physical therapy assessment, plan of care and goals were reviewed.    ASSESSMENT  Patient continues with skilled PT services and is progressing towards goals. Patient presents with overall improved mobility and decreased left knee pain. Patient ambulated 300 feet with walker and SBA. No loss of balance noted. Patient ascended and descended 3 steps with unilateral handrail, step to step pattern, and SBA. Recommend home health PT at discharge.     Current Level of Function Impacting Discharge (mobility/balance): SBA with walker    Other factors to consider for discharge: left knee pain      PLAN :  Patient continues to benefit from skilled  intervention to address the above impairments.  Continue treatment per established plan of care.  to address goals.    Recommendation for discharge: (in order for the patient to meet his/her long term goals)  Physical therapy at least 2 days/week in the home     This discharge recommendation:  Has been made in collaboration with the attending provider and/or case management    IF patient discharges home will need the following DME: none     SUBJECTIVE:   Patient stated ?I feel better than I did.?    OBJECTIVE DATA SUMMARY:   Critical Behavior:  Neurologic State: Appropriate for age, Alert  Orientation Level: Oriented X4  Cognition: Appropriate decision making, Appropriate for age attention/concentration, Appropriate safety awareness, Follows commands  Safety/Judgement: Awareness of environment, Fall prevention, Insight into deficits    Functional Mobility Training:  Bed Mobility:  Rolling: Modified independent  Supine to Sit: Modified independent  Sit to Supine: Modified independent  Scooting: Modified independent    Transfers:  Sit to Stand: Stand-by assistance  Stand to Sit: Stand-by assistance  Bed to Chair: Stand-by assistance    Balance:  Sitting: Intact  Standing: Intact;With support    Ambulation/Gait Training:  Distance (ft): 300 Feet (ft)  Assistive Device: Gait belt;Walker, rolling  Ambulation - Level of Assistance: Stand-by assistance  Gait Abnormalities: Decreased step clearance;Antalgic  Base of Support: Widened  Step Length: Left shortened;Right shortened    Stairs:  Number of Stairs Trained: 3  Stairs - Level of Assistance: Stand-by assistance   Rail Use: Left  Pain Rating:  4/10 left knee; RN aware    Activity Tolerance:   Good    After treatment patient left in no apparent distress:   Sitting in chair and Call bell within reach    COMMUNICATION/COLLABORATION:   The patient?s plan of care was discussed with: Occupational therapist, Registered nurse, Physician, Case management, and Certified  nursing assistant/patient care technician.     Jeanmarie Hubert, PT   Time Calculation: 15 mins      Electronically signed by Jeanmarie Hubert, PT at 07/06/2021 12:15 PM EST

## 2021-07-06 NOTE — Discharge Summary (Signed)
Discharge Summary by Ginger CarneAtchikova, Merlene Dante Yuryevna, MD at 07/06/21 1104                Author: Ginger CarneAtchikova, Neosha Switalski Yuryevna, MD  Service: Hospitalist  Author Type: Physician       Filed: 07/06/21 1504  Date of Service: 07/06/21 1104  Status: Signed          Editor: Ginger CarneAtchikova, Elyce Zollinger Yuryevna, MD (Physician)                                                       Physician Discharge Summary        Patient ID:     Tanya [name]   [id number]   @AGEA @   1941/01/15      Admit date: 07/01/2021      Discharge date and time: 07/06/2021      Hospital Diagnoses and Treatment Rendered:    Tanya [name] is a 81 y.o.  African American female who presents to ED with c/o left leg pain and left knee pain for past couple of  days.  Patient states that she was in her usual state of health when she had swelling of her left knee followed by severe pain in her left leg to the point that she  cannot ambulate without assistance.  Patient states that she has balance noted and [place]  She could not  going to surgery given given her cardiac condition.  Endorsed tobacco use denied any excessive alcohol use or recreational drug use.  Denied  any chest pain shortness of breath, abdominal pain, difficulty with bowel or bladder.      The patient was admitted to medicine, x-ray of the left knee was positive for joint effusion but negative for septic arthritis due to patient did not have fever or chills, and left knee pain most likely related to degenerative joint disease.  Steroids  prednisone p.o. was initiated, colchicine was continued, uric acid level was 5.5, doubt acute gout flare.   Voltaren topical was provided.    Due to dysphagia speech therapy evaluated the patient and recommended the patient would need outpatient evaluation by gastroenterologist due to esophageal dysphagia.  The patient was able to tolerate p.o. diet.  On current medical management condition  of patient improved, back pain and left lower  extremity pain, left knee pain was better controlled.  Patient was evaluated by physical therapy and Occupational Therapy, she was able to ambulate with front wheel walker, and per physical therapy was recommended  to discharge home with home health and physical therapy.   Rest of hospital stay patient remained hemodynamically stable, afebrile, was able to tolerate p.o. diet, ambulate with front wheel walker and assistance and she was ready to be discharged home.         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  Moderate bilateral L3-L4 and right L4-L5 neuronal foraminal stenosis    -CT lumbar spine multilevel lumbar spondylosis.  No fracture   -CT pelvis without any fracture or bone lesion   -X-ray hip with degenerative joint disease and osteopenia   -Patient states she was offered back surgery but could not get clearance.     -X-ray knee with joint effusion.  No fever or chills or white count does not suspect septic arthritis   -At home on allopurinol and colchicine   -Uric acid level 5.5   Continue:    -PT OT   -Pain management   -MRI L spine to rule out nerve impingement/herniated disc    -Colchicine and prednisone course for acute gout attack   -Not a candidate for NSAID PO due to CKD   Voltaren topical left knee QID   Prednisone 40 mg PO Qday, completed 5 days today, 07/06/2021       History of CAD status post stent to RCA 2003   No chest pain   Continue Plavix, statins, Metoprolol, Imdur       History of hypothyroidism   Continue replacement Levothyroxine       History of hypertension   BP controlled   Continue Imdur, BB       History of CKD 3B   Check CMP       History of gout   colchicine       History of  hyperlipidemia   Statins       History of COPD   Controlled   Not requiring supplemental O2   Neb PRN       History of arthritis       History of stroke   -Resume home regimen once confirmed by pharmacy       Constipation   Colace BID   Miralax PRN   Had BM       Dysphagia   ST input appreciated   The patient is able to tolerate PO   Will need follow up GI as outpatient for further evaluation of esophageal dysphagia                Chronic Diagnoses:        Problem List as of 07/06/2021  Date Reviewed: 11/28/2012                           Codes  Class  Noted - Resolved             Pain of left lower extremity  ICD-10-CM: M79.605   ICD-9-CM: 729.5    07/02/2021 - Present                       Acute idiopathic gout of left knee  ICD-10-CM: M10.062   ICD-9-CM: 274.01    07/02/2021 - Present                       Spinal stenosis of lumbar region without neurogenic claudication  ICD-10-CM: M48.061   ICD-9-CM: 724.02    07/02/2021 - Present                       RESOLVED: Lumbago  ICD-10-CM: M54.50   ICD-9-CM: 724.2    07/01/2021 - 07/02/2021                       RESOLVED: Chest pain  ICD-10-CM: R07.9   ICD-9-CM: 786.50    11/26/2012 - 11/28/2012  Discharge Medications:      Current Discharge Medication List                 CONTINUE these medications which have CHANGED          Details        diclofenac (VOLTAREN) 1 % gel  Apply  to affected area as needed for Pain (QID as needed) for up to 10 days.   Qty: 1 Each, Refills: 0   Start date: 07/06/2021, End date: 07/16/2021                        CONTINUE these medications which have NOT CHANGED          Details        DULoxetine (CYMBALTA) 20 mg capsule  Take 20 mg by mouth daily.               metoprolol succinate (TOPROL-XL) 25 mg XL tablet  Take 25 mg by mouth daily.               albuterol (PROVENTIL HFA, VENTOLIN HFA, PROAIR HFA) 90 mcg/actuation inhaler  Take 1 Puff by inhalation as needed for Wheezing.               levocetirizine (XYZAL) 5 mg tablet  Take  5 mg by mouth daily as needed.               rosuvastatin (CRESTOR) 40 mg tablet  Take 40 mg by mouth nightly.               isosorbide mononitrate ER (IMDUR) 30 mg tablet  Take 30 mg by mouth daily.               LEVOTHYROXINE SODIUM (LEVOTHROID PO)  Take 75 mcg by mouth.               clopidogrel (PLAVIX) 75 mg tablet  Take  by mouth daily.               allopurinol (ZYLOPRIM) 100 mg tablet  Take 100 mg by mouth daily.               colchicine 0.6 mg tablet  Take 0.6 mg by mouth daily.                            Follow up Care:     1. Bshsi, Not On File, MD in 1-2 weeks.  Please call to set up an appointment shortly after discharge.        Diet:  Regular Diet      Disposition:   Home with Home health, PT      Advanced Directive:       FULL          DNR  X        Discharge Exam:   See today's note.      CONSULTATIONS: None      Significant Diagnostic Studies:    07/01/2021: BUN 13 MG/DL (Ref range: 6 - 20 MG/DL); Calcium 9.2 MG/DL (Ref range: 8.5 - 93.8 MG/DL); CO2 29 mmol/L (Ref range: 21 - 32 mmol/L); Creatinine 1.34 MG/DL (H; Ref range: 1.01 - 7.51 MG/DL); Glucose 121 mg/dL (H; Ref range: 65 - 025 mg/dL);  HCT 85.2 % (Ref range: 35.0 - 47.0 %); HGB 14.0 g/dL (Ref range: 77.8 - 24.2 g/dL); Potassium 3.7 mmol/L (Ref range:  3.5 - 5.1 mmol/L); Sodium 140 mmol/L (Ref range: 136 - 145 mmol/L)   No results for input(s): WBC, HGB, HCT, PLT, HGBEXT, HCTEXT, PLTEXT in the last 72 hours.   No results for input(s): NA, K, CL, CO2, BUN, CREA, GLU, CA, MG, PHOS, URICA in the last 72 hours.   No results for input(s): AP, TBIL, TP, ALB, GLOB, GGT, AML, LPSE in the last 72 hours.      No lab exists for component: SGOT, GPT, AMYP, HLPSE   No results for input(s): INR, PTP, APTT, INREXT in the last 72 hours.    No results for input(s): FE, TIBC, PSAT, FERR in the last 72 hours.    No results for input(s): PH, PCO2, PO2 in the last 72 hours.   No results for input(s): CPK, CKMB in the last 72 hours.      No lab exists for  component: TROPONINI   No components found for: [facility]         Total time to discharge patient was 31 minutes more than 50% of time was spent for counseling and coordination of care.      Signed:   Ginger CarneElena Yuryevna Miriya Cloer, MD   07/06/2021   11:04 AM

## 2021-07-06 NOTE — Progress Notes (Signed)
Problem: Mobility Impaired (Adult and Pediatric)  Goal: *Acute Goals and Plan of Care (Insert Text)  Description: FUNCTIONAL STATUS PRIOR TO ADMISSION: Patient was modified independent using a rolling walker for functional mobility.    HOME SUPPORT PRIOR TO ADMISSION: The patient lived alone with family to provide assistance.    Physical Therapy Goals  Initiated 07/02/2021  1.  Patient will move from supine to sit and sit to supine  in bed with minimal assistance/contact guard assist within 7 day(s).    2.  Patient will transfer from bed to chair and chair to bed with minimal assistance/contact guard assist using the least restrictive device within 7 day(s).  3.  Patient will perform sit to stand with minimal assistance/contact guard assist within 7 day(s).  4.  Patient will ambulate with minimal assistance/contact guard assist for 25 feet with the least restrictive device within 7 day(s).   5.  Patient will ascend/descend 3 stairs with unilateral handrail(s) with moderate assistance  within 7 day(s).   Outcome: Progressing Towards Goal   PHYSICAL THERAPY TREATMENT  Patient: Tanya [name] (81 y.o. female)  Date: 07/06/2021  Diagnosis: Lumbago [M54.50] <principal problem not specified>      Precautions: Fall, WBAT  Chart, physical therapy assessment, plan of care and goals were reviewed.    ASSESSMENT  Patient continues with skilled PT services and is progressing towards goals. Patient presents with overall improved mobility and decreased left knee pain. Patient ambulated 300 feet with walker and SBA. No loss of balance noted. Patient ascended and descended 3 steps with unilateral handrail, step to step pattern, and SBA. Recommend home health PT at discharge.     Current Level of Function Impacting Discharge (mobility/balance): SBA with walker    Other factors to consider for discharge: left knee pain         PLAN :  Patient continues to benefit from skilled intervention to address the above impairments.  Continue  treatment per established plan of care.  to address goals.    Recommendation for discharge: (in order for the patient to meet his/her long term goals)  Physical therapy at least 2 days/week in the home     This discharge recommendation:  Has been made in collaboration with the attending provider and/or case management    IF patient discharges home will need the following DME: none       SUBJECTIVE:   Patient stated "I feel better than I did."    OBJECTIVE DATA SUMMARY:   Critical Behavior:  Neurologic State: Appropriate for age, Alert  Orientation Level: Oriented X4  Cognition: Appropriate decision making, Appropriate for age attention/concentration, Appropriate safety awareness, Follows commands  Safety/Judgement: Awareness of environment, Fall prevention, Insight into deficits    Functional Mobility Training:  Bed Mobility:  Rolling: Modified independent  Supine to Sit: Modified independent  Sit to Supine: Modified independent  Scooting: Modified independent    Transfers:  Sit to Stand: Stand-by assistance  Stand to Sit: Stand-by assistance  Bed to Chair: Stand-by assistance    Balance:  Sitting: Intact  Standing: Intact;With support    Ambulation/Gait Training:  Distance (ft): 300 Feet (ft)  Assistive Device: Gait belt;Walker, rolling  Ambulation - Level of Assistance: Stand-by assistance  Gait Abnormalities: Decreased step clearance;Antalgic  Base of Support: Widened  Step Length: Left shortened;Right shortened    Stairs:  Number of Stairs Trained: 3  Stairs - Level of Assistance: Stand-by assistance   Rail Use: Left     Pain  Rating:  4/10 left knee; RN aware    Activity Tolerance:   Good    After treatment patient left in no apparent distress:   Sitting in chair and Call bell within reach    COMMUNICATION/COLLABORATION:   The patient's plan of care was discussed with: Occupational therapist, Registered nurse, Physician, Case management, and Certified nursing assistant/patient care technician.     Jeanmarie Hubert,  PT   Time Calculation: 15 mins

## 2021-07-06 NOTE — Progress Notes (Signed)
Formatting of this note is different from the original.  Pharmacist Discharge Medication Reconciliation    Discharge Provider:  Atchikova      Discharge Medications:     My Medications       CHANGE how you take these medications        Instructions Each Dose to Equal Morning Noon Evening Bedtime   diclofenac 1 % Gel  Commonly known as: VOLTAREN  What changed:   when to take this  reasons to take this    Your last dose was:     Your next dose is:        Apply  to affected area as needed for Pain (QID as needed) for up to 10 days.                  CONTINUE taking these medications        Instructions Each Dose to Equal Morning Noon Evening Bedtime   albuterol 90 mcg/actuation inhaler  Commonly known as: PROVENTIL HFA, VENTOLIN HFA, PROAIR HFA    Your last dose was:     Your next dose is:        Take 1 Puff by inhalation as needed for Wheezing.   1 Puff            allopurinoL 100 mg tablet  Commonly known as: ZYLOPRIM    Your last dose was:     Your next dose is:        Take 100 mg by mouth daily.   100 mg            clopidogreL 75 mg Tab  Commonly known as: PLAVIX    Your last dose was:     Your next dose is:        Take  by mouth daily.            colchicine 0.6 mg tablet    Your last dose was:     Your next dose is:        Take 0.6 mg by mouth daily.   0.6 mg            DULoxetine 20 mg capsule  Commonly known as: CYMBALTA    Your last dose was:     Your next dose is:        Take 20 mg by mouth daily.   20 mg            isosorbide mononitrate ER 30 mg tablet  Commonly known as: IMDUR    Your last dose was:     Your next dose is:        Take 30 mg by mouth daily.   30 mg            levocetirizine 5 mg tablet  Commonly known as: XYZAL    Your last dose was:     Your next dose is:        Take 5 mg by mouth daily as needed.   5 mg            LEVOTHROID PO    Your last dose was:     Your next dose is:        Take 75 mcg by mouth.   75 mcg            metoprolol succinate 25 mg XL tablet  Commonly known as:  TOPROL-XL    Your last dose was:     Your next  dose is:        Take 25 mg by mouth daily.   25 mg            rosuvastatin 40 mg tablet  Commonly known as: CRESTOR    Your last dose was:     Your next dose is:        Take 40 mg by mouth nightly.   40 mg                    Where to Get Your Medications       These medications were sent to [id number] [place], [place] - [address]  [address], [place] [address]      Phone: [phone]   diclofenac 1 % Gel      The patient's chart, MAR, and AVS were reviewed by   Barrett Shell, PHARMD, BCPS      Electronically signed by Barrett Shell, PHARMD at 07/06/2021  2:44 PM EST

## 2021-07-06 NOTE — Progress Notes (Signed)
OCCUPATIONAL THERAPY EVALUATION/DISCHARGE  Patient: Tanya [name] ([age] y.o. female)  Date: 07/06/2021  Primary Diagnosis: Lumbago [M54.50]       Precautions: Fall, WBAT    ASSESSMENT  Based on the objective data described below, the patient presents with good safety awareness, no LOB and at an overall SBA and set-up level with LE ADLs, toileting and functional mobility amb with RW.  Pt states she is abck to her baseline and her L knee pain has significantly improved.  No further skilled OT required at this time.    Current Level of Function (ADLs/self-care): SBA and set-up level with LE ADLs, toileting and functional mobility amb with RW    Functional Outcome Measure: The patient scored 50/100 on the Barthel Index outcome measure.      Other factors to consider for discharge: see above     PLAN :  Recommend with staff: up with assist for safety    Recommendation for discharge: (in order for the patient to meet his/her long term goals)  No skilled occupational therapy/ follow up rehabilitation needs identified at this time.    This discharge recommendation:  Has been made in collaboration with the attending provider and/or case management    IF patient discharges home will need the following DME: patient owns DME required for discharge       SUBJECTIVE:   Patient stated ???I am much better.???    OBJECTIVE DATA SUMMARY:   HISTORY:   Past Medical History:   Diagnosis Date    Acid reflux     Asthma     CAD (coronary artery disease)     Hypertension      Past Surgical History:   Procedure Laterality Date    HX ORTHOPAEDIC      right knee replacement    PR UNLISTED PROCEDURE CARDIAC SURGERY      Stents       Prior Level of Function/Environment/Context: Mod I using RW; grandson provides transportation  Expanded or extensive additional review of patient history:   Home Situation  Home Environment: Apartment  # Steps to Enter: 3  Rails to Enter: Yes  Hand Rails : Bilateral (too far to hold both)  One/Two Story Residence: One  story  Living Alone: No  Support Systems: Other Family Member(s) (pt's grandson)  Patient Expects to be Discharged to:: Home  Current DME Used/Available at Home: Cane, straight, Commode, bedside, Walker, rolling  Tub or Shower Type: Tub/Shower combination    Hand dominance: Right    EXAMINATION OF PERFORMANCE DEFICITS:  Cognitive/Behavioral Status:  Neurologic State: Appropriate for age;Alert  Orientation Level: Oriented X4  Cognition: Appropriate decision making;Appropriate for age attention/concentration;Appropriate safety awareness;Follows commands  Perception: Appears intact  Perseveration: No perseveration noted  Safety/Judgement: Awareness of environment;Fall prevention;Insight into deficits    Hearing:  Auditory  Auditory Impairment: None    Vision/Perceptual:    Acuity: Able to read clock/calendar on wall without difficulty;Able to read employee name badge without difficulty    Corrective Lenses: Reading glasses    Range of Motion:  AROM: Generally decreased, functional  PROM: Generally decreased, functional                      Strength:  Strength: Generally decreased, functional                Coordination:     Fine Motor Skills-Upper: Left Intact;Right Intact    Gross Motor Skills-Upper: Left Intact;Right Intact  Balance:  Sitting: Intact  Standing: Intact;With support    Functional Mobility and Transfers for ADLs:  Bed Mobility:  Rolling: Modified independent  Supine to Sit: Modified independent  Sit to Supine: Modified independent  Scooting: Modified independent    Transfers:  Sit to Stand: Stand-by assistance  Stand to Sit: Stand-by assistance  Bed to Chair: Stand-by assistance  Bathroom Mobility: Stand-by assistance  Toilet Transfer : Stand-by assistance  Assistive Device : Walker, rolling    ADL Assessment and Intervention:  Feeding: Independent    Oral Facial Hygiene/Grooming: Stand-by assistance (standing at sink)    Bathing: Stand-by assistance;Additional time;Assist x1 (seated)    Type of  Bath: Chlorhexidine (CHG);Bath Pack;Full    Upper Body Dressing: Setup    Lower Body Dressing: Stand-by assistance;Additional time (crossed leg technique to reach feet)    Toileting: Stand by assistance     Patient instructed and indicated understanding energy conservation techniques to increase independence and safety during ADLs.     Cognitive Retraining  Safety/Judgement: Awareness of environment;Fall prevention;Insight into deficits      Functional Measure:    Barthel Index:  Bathing: 0  Bladder: 5  Bowels: 10  Grooming: 5  Dressing: 5  Feeding: 10  Mobility: 0  Stairs: 0  Toilet Use: 5  Transfer (Bed to Chair and Back): 10  Total: 50/100      The Barthel ADL Index: Guidelines  1. The index should be used as a record of what a patient does, not as a record of what a patient could do.  2. The main aim is to establish degree of independence from any help, physical or verbal, however minor and for whatever reason.  3. The need for supervision renders the patient not independent.  4. A patient's performance should be established using the best available evidence. Asking the patient, friends/relatives and nurses are the usual sources, but direct observation and common sense are also important. However direct testing is not needed.  5. Usually the patient's performance over the preceding 24-48 hours is important, but occasionally longer periods will be relevant.  6. Middle categories imply that the patient supplies over 50 per cent of the effort.  7. Use of aids to be independent is allowed.    Score Interpretation (from Sinoff 1997)   80-100 Independent   60-79 Minimally independent   40-59 Partially dependent   20-39 Very dependent   <20 Totally dependent     -Mahoney, F.l., Barthel, D.W. (1965). Functional evaluation: the Barthel Index. [place] [address] Med [place] (14)2.  -Sinoff, G., Ore, L. (1997). The Barthel activities of daily living index: self-reporting versus actual performance in the old (> or = 75 years). Journal of  American Geriatric Society 45(7), [phone].   -Kenn File Paul, J.J.M.F, Noel Christmas., Imagene Gurney. (1999). Measuring the change in disability after inpatient rehabilitation; comparison of the responsiveness of the Barthel Index and Functional Independence Measure. Journal of Neurology, Neurosurgery, and Psychiatry, 66(4), [phone].  [name] Bills, N.J.A, Scholte op [place],  W.J.M, & Koopmanschap, M.A. (2004) Assessment of post-stroke quality of life in cost-effectiveness studies: The usefulness of the Barthel Index and the EuroQoL-5D. Quality of Life Research, 37, 272-53        Occupational Therapy Evaluation Charge Determination   History Examination Decision-Making   LOW Complexity : Brief history review  MEDIUM Complexity : 3-5 performance deficits relating to physical, cognitive , or psychosocial skils that result in activity limitations and / or participation restrictions MEDIUM Complexity :  Patient may present with comorbidities that affect occupational performnce. Miniml to moderate modification of tasks or assistance (eg, physical or verbal ) with assesment(s) is necessary to enable patient to complete evaluation       Based on the above components, the patient evaluation is determined to be of the following complexity level: LOW   Pain Rating:  4/10    Activity Tolerance:   Fair and requires rest breaks    After treatment patient left in no apparent distress:    Sitting in chair and Call bell within reach    COMMUNICATION/EDUCATION:   The patient???s plan of care was discussed with: Physical therapist, Registered nurse, and Physician.     Thank you for this referral.  Lucretia Field, OT  Time Calculation: 20 mins

## 2021-07-07 ENCOUNTER — Telehealth: Admit: 2021-07-07 | Discharge: 2021-07-07 | Payer: MEDICARE | Attending: Family | Primary: Family

## 2021-07-07 NOTE — Progress Notes (Signed)
 Pt is here for   Chief Complaint   Patient presents with    New Patient     Here to establish care     Hospital Follow Up     07/01/2021 [facility] for extremity pain      1. Have you been to the ER, urgent care clinic since your last visit?  Hospitalized since your last visit? Yes When: 07/01/2021 [facility] for pain     2. Have you seen or consulted any other health care providers outside of the [facility] since your last visit? No     3. For patients aged 39-75: Has the patient had a colonoscopy / FIT/ Cologuard? No      If the patient is female:    4. For patients aged 90-74: Has the patient had a mammogram within the past 2 years? No      5. For patients aged 21-65: Has the patient had a pap smear? NA - based on age or sex

## 2021-07-07 NOTE — Progress Notes (Signed)
Tanya [name] is a 81 y.o. female new patient, here for evaluation of the following chief complaint(s):   New Patient (Here to establish care ) and Hospital Follow Up (07/01/2021 [facility] for extremity pain )          Assessment & Plan:   Diagnoses and all orders for this visit:    1. Primary osteoarthritis of left knee    2. Spinal stenosis of lumbar region without neurogenic claudication    3. Stage 3b chronic kidney disease (HCC)    4. Knee effusion, left    5. Encounter to establish care    6. Encounter for support and coordination of transition of care              Follow-up and Dispositions    Return if symptoms worsen or fail to improve.           Specific pt instructions until next visit: use knee sleeve DAILY and continue use of voltaren gel until seen by [facility] and ORTHO.     Subjective:   Tanya [name] is a 81 y.o. female who was seen for New Patient (Here to establish care ) and Hospital Follow Up (07/01/2021 [facility] for extremity pain )      Pt is here for hospital follow-up transition of care from 12/29 to 1/3 for left knee and leg pain. Contacted or attempted contact within two days of d/c by NN. Diagnosed with joint effusion and DJD. Was given labs, xray, consult and MRI. Instructed to schedule appt with PCP, PA Caryn Section, and JENCARE. Reports feeling BETTER THAN when in hospital. Knee pain resolved. Continues to use voltaren gel and other meds.       Patient Active Problem List    Diagnosis Date Noted    Chronic renal disease, stage III 07/07/2021    Pain of left lower extremity 07/02/2021    Acute idiopathic gout of left knee 07/02/2021    Spinal stenosis of lumbar region without neurogenic claudication 07/02/2021     Current Outpatient Medications   Medication Sig Dispense Refill    diclofenac (VOLTAREN) 1 % gel Apply  to affected area as needed for Pain (QID as needed) for up to 10 days. 1 Each 0    DULoxetine (CYMBALTA) 20 mg capsule Take 20 mg by mouth daily.      metoprolol succinate (TOPROL-XL) 25 mg XL  tablet Take 25 mg by mouth daily.      albuterol (PROVENTIL HFA, VENTOLIN HFA, PROAIR HFA) 90 mcg/actuation inhaler Take 1 Puff by inhalation as needed for Wheezing.      levocetirizine (XYZAL) 5 mg tablet Take 5 mg by mouth daily as needed.      rosuvastatin (CRESTOR) 40 mg tablet Take 40 mg by mouth nightly.      isosorbide mononitrate ER (IMDUR) 30 mg tablet Take 30 mg by mouth daily.      LEVOTHYROXINE SODIUM (LEVOTHROID PO) Take 75 mcg by mouth.      clopidogrel (PLAVIX) 75 mg tablet Take  by mouth daily.      allopurinol (ZYLOPRIM) 100 mg tablet Take 100 mg by mouth daily.      colchicine 0.6 mg tablet Take 0.6 mg by mouth daily.       Allergies   Allergen Reactions    Iodinated Contrast Media Unknown (comments)    Pcn [Penicillins] Hives     Past Medical History:   Diagnosis Date    Acid reflux     Asthma  CAD (coronary artery disease)     Hypertension      Past Surgical History:   Procedure Laterality Date    HX ORTHOPAEDIC      right knee replacement    PR UNLISTED PROCEDURE CARDIAC SURGERY      Stents       Review of Systems   Constitutional: Negative for fever and malaise/fatigue.   Eyes: Negative for blurred vision.   Respiratory: Negative for cough and shortness of breath.    Cardiovascular: Negative for chest pain and leg swelling.   Neurological: Negative for dizziness, weakness and headaches.     Objective:   Vital Signs: (As obtained by patient/caregiver at home)  There were no vitals taken for this visit.           Physical Exam:  General appearance - alert, well  appearing, and in no acute distress. Ambulatory in home with phone without use of assistive device  Mental status - A/O x 4,normal mood and affect.   Eyes- no periorbital edema, drainage, or irritation noted.   Nose- no obvious drainage or swelling.  Throat- no obvious swelling, goiter, or notable lymphadenopathy  Chest - Symmetric chest rise. No wheezing or coughing. No distress.  Skin- normal skin tone noted. No hyperpigmentation or  obvious deformities. No diaphoresis noted. No flushing.  Neuro - Normal speech, no focal findings or movement disorder.     Other pertinent observable physical exam findings:-        We discussed the expected course, resolution and complications of the diagnosis(es) in detail.  Medication risks, benefits, costs, interactions, and alternatives were discussed as indicated.  I advised her to contact the office if her condition worsens, changes or fails to improve as anticipated. She expressed understanding with the diagnosis(es) and plan.     [name], was evaluated through a synchronous (real-time) audio-video encounter. The patient (or guardian if applicable) is aware that this is a billable service, which includes applicable co-pays. This Virtual Visit was conducted with patient's (and/or legal guardian's) consent. The visit was conducted pursuant to the emergency declaration under the [employer] and the [employer], 1135 waiver authority and the [employer] Act.  Patient identification was verified, and a caregiver was present when appropriate.  The patient was located at: Home: [address]  [place] [address]  The provider was located at: Home: @[email or web address] @   in [place], [place]      An electronic signature was used to authenticate this note.  -- [place], NP

## 2021-08-26 NOTE — Progress Notes (Unsigned)
Cardiology Office Note   Date:  08/26/2021   ID:  Mabel, Roll 01-11-1941, MRN [id number]  PCP:  [name], MD  Cardiologist:   None   No chief complaint on file.      History of Present Illness: LENEA BYWATER is a 81 y.o. female who presents for follow up of CAD s/p stent mRCA 2003.  She was seen back in 2016 by Dr. Gwenlyn Found for chest pain and ruled out with troponins. She underwent a lexiscan that was normal.   She returns follow up.  When I saw her late last year she had some chest pain.  Lexiscan Myoview was negative for ischemia.  ***    *** She lives by herself.  She does not really have any family here.  She says she has a sister and some grandchildren who want her moved to [place] and she might do this next month.  She had been depressed and she has not taken any of her medicines in 3 months.  She has some chest discomfort.  It is very hard to tease the symptoms out.  This may or may not be the same as 2019 when she had a stress test but she says she is taking nitroglycerin occasionally like when she has to carry in the groceries.  This does not sound like it is routinely when she carries in groceries but it does seem like perhaps an increased pattern may be having to do with timing of her stopping her medications.  She does not seem to be describing resting chest discomfort.  She is not having any resting shortness of breath, PND or orthopnea.  She gets around slowly with a cane because of joint pains and weak legs.  She does walk through the grocery store and somebody drives her there.  It sounds like she does some minimal household chores.  Past Medical History:  Diagnosis Date   Aortic atherosclerosis ([place]) 02/01/2018   Arthritis    COPD (chronic obstructive pulmonary disease) (HCC)    Coronary artery disease    s/p stent mid RCA 2003   GERD (gastroesophageal reflux disease)    Gout    HLD (hyperlipidemia)    Hypertension    Hypertensive heart disease  without CHF    Hypothyroidism    Morbid obesity ([place]) 02/01/2018   Peripheral vascular disease ([place]) 02/01/2018   Stage 4 chronic kidney disease ([place]) 02/01/2018   Stroke ([place])    Tobacco abuse     Past Surgical History:  Procedure Laterality Date   ABDOMINAL HYSTERECTOMY     LAPAROSCOPY N/A 04/11/2018   Procedure: LAPAROSCOPY DIAGNOSTIC;  Surgeon: Rolm Bookbinder, MD;  Location: [facility];  Service: General;  Laterality: N/A;   LAPAROTOMY N/A 04/11/2018   Procedure: EXPLORATORY LAPAROTOMY;  Surgeon: Rolm Bookbinder, MD;  Location: [facility];  Service: General;  Laterality: N/A;   LYSIS OF ADHESION N/A 04/11/2018   Procedure: LYSIS OF ADHESION;  Surgeon: Rolm Bookbinder, MD;  Location: [facility];  Service: General;  Laterality: N/A;   PARTIAL COLECTOMY N/A 04/11/2018   Procedure: COLECTOMY;  Surgeon: Rolm Bookbinder, MD;  Location: [facility];  Service: General;  Laterality: N/A;   REPLACEMENT TOTAL KNEE Right    THYROID SURGERY       Current Outpatient Medications  Medication Sig Dispense Refill   allopurinol (ZYLOPRIM) 100 MG tablet Take 1 tablet (100 mg total) by mouth daily. 30 tablet 1   clopidogrel (PLAVIX) 75 MG tablet  Take 1 tablet (75 mg total) by mouth daily. 30 tablet 12   diclofenac sodium (VOLTAREN) 1 % GEL Apply 4 g topically 4 (four) times daily as needed for pain. (Patient not taking: Reported on 04/26/2021)  5   DULoxetine (CYMBALTA) 20 MG capsule Take 1 capsule (20 mg total) by mouth daily. (Patient not taking: Reported on 04/26/2021) 30 capsule 3   gabapentin (NEURONTIN) 100 MG capsule TAKE 1 CAPSULE(100 MG) BY MOUTH AT BEDTIME (Patient not taking: Reported on 04/26/2021) 30 capsule 1   isosorbide mononitrate (IMDUR) 30 MG 24 hr tablet TAKE 1 TABLET (30 MG TOTAL) BY MOUTH DAILY. 90 tablet 3   levothyroxine (SYNTHROID) 75 MCG tablet Take 75 mcg by mouth daily before breakfast.     nitroGLYCERIN (NITROSTAT) 0.4 MG SL tablet DISSOLVE ONE TABLET UNDER THE TONGUE EVERY 5 MINUTES FOR  3 DOSES AS NEEDED FOR CHEST PAIN (Patient not taking: Reported on 04/26/2021) 90 tablet 3   pantoprazole (PROTONIX) 40 MG tablet Take 1 tablet (40 mg total) by mouth 2 (two) times daily. (Patient not taking: Reported on 04/26/2021) 60 tablet 3   rosuvastatin (CRESTOR) 40 MG tablet TAKE 1 TABLET (40 MG TOTAL) BY MOUTH DAILY. 90 tablet 3   tiZANidine (ZANAFLEX) 4 MG tablet Take 4 mg by mouth 2 (two) times daily as needed for muscle spasms. (Patient not taking: Reported on 04/26/2021)     No current facility-administered medications for this visit.    Allergies:   Ivp dye [iodinated contrast media], Oxycodone, and Penicillins    ROS:  Please see the history of present illness.   Otherwise, review of systems are positive for follow up of ***.   All other systems are reviewed and negative.    PHYSICAL EXAM: VS:  There were no vitals taken for this visit. , BMI There is no height or weight on file to calculate BMI.  GENERAL:  Well appearing NECK:  No jugular venous distention, waveform within normal limits, carotid upstroke brisk and symmetric, no bruits, no thyromegaly LUNGS:  Clear to auscultation bilaterally CHEST:  Unremarkable HEART:  PMI not displaced or sustained,S1 and S2 within normal limits, no S3, no S4, no clicks, no rubs, *** murmurs ABD:  Flat, positive bowel sounds normal in frequency in pitch, no bruits, no rebound, no guarding, no midline pulsatile mass, no hepatomegaly, no splenomegaly EXT:  2 plus pulses throughout, no edema, no cyanosis no clubbing    ***GENERAL:  Well appearing NECK:  No jugular venous distention, waveform within normal limits, carotid upstroke brisk and symmetric, no bruits, no thyromegaly LUNGS:  Clear to auscultation bilaterally CHEST:  Unremarkable HEART:  PMI not displaced or sustained,S1 and S2 within normal limits, no S3, no S4, no clicks, no rubs, no murmurs ABD:  Flat, positive bowel sounds normal in frequency in pitch, no bruits, no rebound, no  guarding, no midline pulsatile mass, no hepatomegaly, no splenomegaly EXT:  2 plus pulses throughout, no edema, no cyanosis no clubbing  EKG:  EKG is *** ordered today. Sinus rhythm, rate ***, axis within normal limits, IVCD, poor anterior R wave progression, no acute ST-T wave changes. No change from previous  Recent Labs: No results found for requested labs within last 8760 hours.    Lipid Panel    Component Value Date/Time   CHOL 136 06/18/2019 1246   TRIG 113 06/18/2019 1246   HDL 71 06/18/2019 1246   CHOLHDL 1.9 06/18/2019 1246   CHOLHDL 2.8 02/02/2018 0410   VLDL 13  02/02/2018 0410   LDLCALC 45 06/18/2019 1246      Wt Readings from Last 3 Encounters:  05/05/21 200 lb (90.7 kg)  04/26/21 200 lb (90.7 kg)  07/10/20 195 lb (88.5 kg)      Other studies Reviewed: Additional studies/ records that were reviewed today include:  *** Review of the above records demonstrates:  Please see elsewhere in the note.     ASSESSMENT AND PLAN:  CAD:     she had no evidence of ischemia on her perfusion study in Nov.  *** The patient is having some chest discomfort.  I am going to screen her with a Lexiscan Myoview.   Barring any high risk findings I would not suggest further testing but she does need to be on her meds and we will be calling her to review what she has at home and what active prescriptions so that we can reinitiate her meds including her Imdur and antihypertensives.   HTN:  The blood pressure is *** elevated in part because she is not taking her medications and as above we will be reinitiating these.   DYSLIPIDEMIA:  LDL was  *** not checked since 2020 and she will come back for fasting lipid profile as well as c-Met TSH and CBC.    CKD IIIA:   Her last creatinine was *** in February was 1.35.  I will repeat this.   TOBACCO ABUSE:    *** She is unfortunately still smoking cigarettes we had this conversation again today about stopping smoking.  FATIGUE:    CBC and TSH  were *** This has been a chronic complaint.  I will be checking a CBC and TSH as above.  She has poor sleep patterns.  Current medicines are reviewed at length with the patient today.  The patient does not have concerns regarding medicines.  The following changes have been made:   ***  Labs/ tests ordered today include:  ***  No orders of the defined types were placed in this encounter.   Disposition:   FU with APP in ***   Signed, Minus Breeding, MD  08/26/2021 7:04 PM    [facility]

## 2021-08-27 ENCOUNTER — Ambulatory Visit: Payer: Medicare Other | Admitting: Cardiology

## 2021-08-27 DIAGNOSIS — I2510 Atherosclerotic heart disease of native coronary artery without angina pectoris: Secondary | ICD-10-CM

## 2021-08-27 DIAGNOSIS — I10 Essential (primary) hypertension: Secondary | ICD-10-CM

## 2021-08-27 DIAGNOSIS — E785 Hyperlipidemia, unspecified: Secondary | ICD-10-CM

## 2021-08-27 DIAGNOSIS — N1831 Chronic kidney disease, stage 3a: Secondary | ICD-10-CM

## 2022-05-27 ENCOUNTER — Encounter

## 2022-06-15 ENCOUNTER — Inpatient Hospital Stay: Admit: 2022-06-15 | Payer: MEDICARE | Attending: Nephrology | Primary: Family

## 2022-06-15 DIAGNOSIS — N1832 Chronic kidney disease, stage 3b: Secondary | ICD-10-CM

## 2022-07-26 ENCOUNTER — Inpatient Hospital Stay
Admission: EM | Admit: 2022-07-26 | Discharge: 2022-07-29 | Disposition: A | Discharge status: Home / Self Care | Payer: MEDICARE | Admitting: Internal Medicine

## 2022-07-26 ENCOUNTER — Emergency Department: Admit: 2022-07-26 | Payer: MEDICARE | Primary: Student in an Organized Health Care Education/Training Program

## 2022-07-26 DIAGNOSIS — J441 Chronic obstructive pulmonary disease with (acute) exacerbation: Principal | ICD-10-CM

## 2022-07-26 DIAGNOSIS — I5022 Chronic systolic (congestive) heart failure: Secondary | ICD-10-CM

## 2022-07-26 LAB — POC CHEMISTRY (NA,K,ICA,GLU,CALC HCT/HGB,LACTATE,CREA,CL)
Anion Gap, POC: 8 — ABNORMAL LOW (ref 10–20)
POC Chloride: 110 MMOL/L — ABNORMAL HIGH (ref 100–108)
POC Creatinine: 1.4 MG/DL — ABNORMAL HIGH (ref 0.6–1.3)
POC Glucose: 115 MG/DL — ABNORMAL HIGH (ref 74–106)
POC Ionized Calcium: 1.08 mmol/L — ABNORMAL LOW (ref 1.12–1.32)
POC Lactic Acid: 2.25 mmol/L (ref 0.40–2.00)
POC Potassium: 3.7 MMOL/L (ref 3.5–5.5)
POC Sodium: 142 MMOL/L (ref 136–145)
POC TCO2: 24 MMOL/L (ref 19–24)
eGFR, POC: 37 mL/min/{1.73_m2} — ABNORMAL LOW (ref >60)

## 2022-07-26 LAB — CBC WITH AUTO DIFFERENTIAL
Absolute Immature Granulocyte: 0 10*3/uL (ref 0.00–0.04)
Basophils %: 0 % (ref 0–1)
Basophils Absolute: 0 10*3/uL (ref 0.0–0.1)
Eosinophils %: 1 % (ref 0–7)
Eosinophils Absolute: 0.1 10*3/uL (ref 0.0–0.4)
Hematocrit: 37.7 % (ref 35.0–47.0)
Hemoglobin: 12.6 g/dL (ref 11.5–16.0)
Immature Granulocytes: 0 % (ref 0.0–0.5)
Lymphocytes %: 8 % — ABNORMAL LOW (ref 12–49)
Lymphocytes Absolute: 0.4 10*3/uL — ABNORMAL LOW (ref 0.8–3.5)
MCH: 26 PG (ref 26.0–34.0)
MCHC: 33.4 g/dL (ref 30.0–36.5)
MCV: 77.7 FL — ABNORMAL LOW (ref 80.0–99.0)
MPV: 10 FL (ref 8.9–12.9)
Monocytes %: 6 % (ref 5–13)
Monocytes Absolute: 0.3 10*3/uL (ref 0.0–1.0)
Neutrophils %: 85 % — ABNORMAL HIGH (ref 32–75)
Neutrophils Absolute: 4.1 10*3/uL (ref 1.8–8.0)
Nucleated RBCs: 0 PER 100 WBC
Platelets: 211 10*3/uL (ref 150–400)
RBC: 4.85 M/uL (ref 3.80–5.20)
RDW: 17.8 % — ABNORMAL HIGH (ref 11.5–14.5)
WBC: 4.9 10*3/uL (ref 3.6–11.0)
nRBC: 0 10*3/uL (ref 0.00–0.01)

## 2022-07-26 LAB — COVID-19 & INFLUENZA COMBO
Rapid Influenza A By PCR: DETECTED
Rapid Influenza B By PCR: NOT DETECTED
SARS-CoV-2, PCR: NOT DETECTED

## 2022-07-26 LAB — COMPREHENSIVE METABOLIC PANEL
ALT: 19 U/L (ref 12–78)
AST: 23 U/L (ref 15–37)
Albumin/Globulin Ratio: 0.9 — ABNORMAL LOW (ref 1.1–2.2)
Albumin: 3.5 g/dL (ref 3.5–5.0)
Alk Phosphatase: 101 U/L (ref 45–117)
Anion Gap: 11 mmol/L (ref 5–15)
BUN: 17 MG/DL (ref 6–20)
Bun/Cre Ratio: 10 — ABNORMAL LOW (ref 12–20)
CO2: 26 mmol/L (ref 21–32)
Calcium: 8.7 MG/DL (ref 8.5–10.1)
Chloride: 104 mmol/L (ref 97–108)
Creatinine: 1.66 MG/DL — ABNORMAL HIGH (ref 0.55–1.02)
Est, Glom Filt Rate: 30 mL/min/{1.73_m2} — ABNORMAL LOW (ref >60)
Globulin: 3.9 g/dL (ref 2.0–4.0)
Glucose: 111 mg/dL — ABNORMAL HIGH (ref 65–100)
Potassium: 3.6 mmol/L (ref 3.5–5.1)
Sodium: 141 mmol/L (ref 136–145)
Total Bilirubin: 0.4 MG/DL (ref 0.2–1.0)
Total Protein: 7.4 g/dL (ref 6.4–8.2)

## 2022-07-26 LAB — EKG 12-LEAD
Atrial Rate: 109 {beats}/min
P Axis: 69 degrees
P-R Interval: 146 ms
Q-T Interval: 350 ms
QRS Duration: 126 ms
QTc Calculation (Bazett): 471 ms
R Axis: -44 degrees
T Axis: 115 degrees
Ventricular Rate: 109 {beats}/min

## 2022-07-26 LAB — POC LACTIC ACID: POC Lactic Acid: 0.66 mmol/L (ref 0.40–2.00)

## 2022-07-26 LAB — BRAIN NATRIURETIC PEPTIDE: NT Pro-BNP: 633 PG/ML — ABNORMAL HIGH (ref 0–450)

## 2022-07-26 LAB — TROPONIN: Troponin, High Sensitivity: 58 ng/L — ABNORMAL HIGH (ref 0–51)

## 2022-07-26 MED ORDER — CLOPIDOGREL BISULFATE 75 MG PO TABS
75 MG | Freq: Every day | ORAL | Status: DC
Start: 2022-07-26 — End: 2022-07-29
  Administered 2022-07-27 – 2022-07-29 (×4): 75 mg via ORAL

## 2022-07-26 MED ORDER — OSELTAMIVIR PHOSPHATE 75 MG PO CAPS
75 MG | Freq: Once | ORAL | Status: AC
Start: 2022-07-26 — End: 2022-07-26
  Administered 2022-07-26: 22:00:00 75 mg via ORAL

## 2022-07-26 MED ORDER — NORMAL SALINE FLUSH 0.9 % IV SOLN
0.9 % | Freq: Two times a day (BID) | INTRAVENOUS | Status: DC
Start: 2022-07-26 — End: 2022-07-29
  Administered 2022-07-27 – 2022-07-28 (×4): 10 mL via INTRAVENOUS

## 2022-07-26 MED ORDER — ONDANSETRON HCL 4 MG/2ML IJ SOLN
4 MG/2ML | Freq: Four times a day (QID) | INTRAMUSCULAR | Status: DC | PRN
Start: 2022-07-26 — End: 2022-07-29

## 2022-07-26 MED ORDER — ROSUVASTATIN CALCIUM 10 MG PO TABS
10 MG | Freq: Every evening | ORAL | Status: DC
Start: 2022-07-26 — End: 2022-07-29
  Administered 2022-07-27 – 2022-07-29 (×3): 40 mg via ORAL

## 2022-07-26 MED ORDER — FUROSEMIDE 40 MG PO TABS
40 MG | Freq: Every day | ORAL | Status: DC
Start: 2022-07-26 — End: 2022-07-29
  Administered 2022-07-27 – 2022-07-29 (×4): 40 mg via ORAL

## 2022-07-26 MED ORDER — IPRATROPIUM-ALBUTEROL 0.5-2.5 (3) MG/3ML IN SOLN
RESPIRATORY_TRACT | Status: AC
Start: 2022-07-26 — End: 2022-07-26
  Administered 2022-07-26: 22:00:00 1 via RESPIRATORY_TRACT

## 2022-07-26 MED ORDER — ONDANSETRON 4 MG PO TBDP
4 MG | Freq: Three times a day (TID) | ORAL | Status: DC | PRN
Start: 2022-07-26 — End: 2022-07-29

## 2022-07-26 MED ORDER — ACETAMINOPHEN 325 MG PO TABS
325 MG | Freq: Four times a day (QID) | ORAL | Status: DC | PRN
Start: 2022-07-26 — End: 2022-07-29

## 2022-07-26 MED ORDER — PREDNISONE 20 MG PO TABS
20 MG | Freq: Every day | ORAL | Status: DC
Start: 2022-07-26 — End: 2022-07-29
  Administered 2022-07-27 – 2022-07-29 (×3): 40 mg via ORAL

## 2022-07-26 MED ORDER — AZITHROMYCIN 500 MG IV SOLR
500 MG | Freq: Once | INTRAVENOUS | Status: AC
Start: 2022-07-26 — End: 2022-07-26
  Administered 2022-07-26: 21:00:00 500 mg via INTRAVENOUS

## 2022-07-26 MED ORDER — VIAL-MATE ADAPTOR
500 MG | INTRAVENOUS | Status: DC
Start: 2022-07-26 — End: 2022-07-27

## 2022-07-26 MED ORDER — OSELTAMIVIR PHOSPHATE 30 MG PO CAPS
30 MG | Freq: Two times a day (BID) | ORAL | Status: DC
Start: 2022-07-26 — End: 2022-07-29
  Administered 2022-07-27 – 2022-07-29 (×5): 30 mg via ORAL

## 2022-07-26 MED ORDER — NORMAL SALINE FLUSH 0.9 % IV SOLN
0.9 % | INTRAVENOUS | Status: DC | PRN
Start: 2022-07-26 — End: 2022-07-29

## 2022-07-26 MED ORDER — ALLOPURINOL 100 MG PO TABS
100 MG | Freq: Every day | ORAL | Status: DC
Start: 2022-07-26 — End: 2022-07-29
  Administered 2022-07-27 – 2022-07-29 (×4): 100 mg via ORAL

## 2022-07-26 MED ORDER — POLYETHYLENE GLYCOL 3350 17 G PO PACK
17 g | Freq: Every day | ORAL | Status: DC | PRN
Start: 2022-07-26 — End: 2022-07-29

## 2022-07-26 MED ORDER — METHYLPREDNISOLONE NA SUC (PF) 125 MG IJ SOLR
125 MG | Freq: Once | INTRAMUSCULAR | Status: AC
Start: 2022-07-26 — End: 2022-07-26
  Administered 2022-07-26: 22:00:00 125 mg via INTRAVENOUS

## 2022-07-26 MED ORDER — SODIUM CHLORIDE 0.9 % IV SOLN
0.9 % | INTRAVENOUS | Status: DC | PRN
Start: 2022-07-26 — End: 2022-07-29

## 2022-07-26 MED ORDER — LOSARTAN POTASSIUM 25 MG PO TABS
25 MG | Freq: Every day | ORAL | Status: DC
Start: 2022-07-26 — End: 2022-07-29
  Administered 2022-07-27 – 2022-07-29 (×4): 12.5 mg via ORAL

## 2022-07-26 MED ORDER — ASPIRIN 81 MG PO CHEW
81 MG | Freq: Every day | ORAL | Status: DC
Start: 2022-07-26 — End: 2022-07-29
  Administered 2022-07-27 – 2022-07-29 (×4): 81 mg via ORAL

## 2022-07-26 MED ORDER — GUAIFENESIN-DM 100-10 MG/5ML PO SYRP
100-10 MG/5ML | ORAL | Status: DC | PRN
Start: 2022-07-26 — End: 2022-07-29
  Administered 2022-07-27: 07:00:00 5 mL via ORAL

## 2022-07-26 MED ORDER — SODIUM CHLORIDE 0.9 % IV BOLUS
0.9 % | Freq: Once | INTRAVENOUS | Status: AC
Start: 2022-07-26 — End: 2022-07-26
  Administered 2022-07-26: 21:00:00 1365 mL/kg via INTRAVENOUS

## 2022-07-26 MED ORDER — BENZONATATE 100 MG PO CAPS
100 MG | Freq: Three times a day (TID) | ORAL | Status: DC | PRN
Start: 2022-07-26 — End: 2022-07-29

## 2022-07-26 MED ORDER — CEFTRIAXONE SODIUM 1 G IJ SOLR
1 g | Freq: Once | INTRAMUSCULAR | Status: AC
Start: 2022-07-26 — End: 2022-07-26
  Administered 2022-07-26: 21:00:00 1000 mg via INTRAVENOUS

## 2022-07-26 MED ORDER — SPIRONOLACTONE 25 MG PO TABS
25 MG | Freq: Every day | ORAL | Status: DC
Start: 2022-07-26 — End: 2022-07-29
  Administered 2022-07-27 – 2022-07-29 (×4): 25 mg via ORAL

## 2022-07-26 MED ORDER — ENOXAPARIN SODIUM 40 MG/0.4ML IJ SOSY
40 MG/0.4ML | INTRAMUSCULAR | Status: DC
Start: 2022-07-26 — End: 2022-07-29
  Administered 2022-07-26 – 2022-07-28 (×3): 40 mg via SUBCUTANEOUS

## 2022-07-26 MED ORDER — ACETAMINOPHEN 650 MG RE SUPP
650 MG | Freq: Four times a day (QID) | RECTAL | Status: DC | PRN
Start: 2022-07-26 — End: 2022-07-29

## 2022-07-26 MED ORDER — IPRATROPIUM-ALBUTEROL 0.5-2.5 (3) MG/3ML IN SOLN
RESPIRATORY_TRACT | Status: DC
Start: 2022-07-26 — End: 2022-07-28
  Administered 2022-07-27 – 2022-07-28 (×7): 1 via RESPIRATORY_TRACT

## 2022-07-26 MED ORDER — ACETAMINOPHEN 500 MG PO TABS
500 MG | ORAL | Status: AC
Start: 2022-07-26 — End: 2022-07-26
  Administered 2022-07-26: 21:00:00 1000 mg via ORAL

## 2022-07-26 MED ORDER — ISOSORBIDE MONONITRATE ER 30 MG PO TB24
30 MG | Freq: Every day | ORAL | Status: DC
Start: 2022-07-26 — End: 2022-07-29
  Administered 2022-07-27 – 2022-07-29 (×4): 30 mg via ORAL

## 2022-07-26 MED ORDER — BISACODYL 10 MG RE SUPP
10 MG | Freq: Every day | RECTAL | Status: DC | PRN
Start: 2022-07-26 — End: 2022-07-29

## 2022-07-26 MED FILL — SOLU-MEDROL (PF) 125 MG IJ SOLR: 125 MG | INTRAMUSCULAR | Qty: 125

## 2022-07-26 MED FILL — TAMIFLU 75 MG PO CAPS: 75 MG | ORAL | Qty: 1

## 2022-07-26 MED FILL — IPRATROPIUM-ALBUTEROL 0.5-2.5 (3) MG/3ML IN SOLN: RESPIRATORY_TRACT | Qty: 3

## 2022-07-26 MED FILL — AZITHROMYCIN 500 MG IV SOLR: 500 MG | INTRAVENOUS | Qty: 500

## 2022-07-26 MED FILL — ENOXAPARIN SODIUM 40 MG/0.4ML IJ SOSY: 40 MG/0.4ML | INTRAMUSCULAR | Qty: 0.4

## 2022-07-26 MED FILL — ACETAMINOPHEN EXTRA STRENGTH 500 MG PO TABS: 500 MG | ORAL | Qty: 2

## 2022-07-26 MED FILL — SODIUM CHLORIDE 0.9 % IV SOLN: 0.9 % | INTRAVENOUS | Qty: 2000

## 2022-07-26 MED FILL — BD POSIFLUSH 0.9 % IV SOLN: 0.9 % | INTRAVENOUS | Qty: 40

## 2022-07-26 MED FILL — CEFTRIAXONE SODIUM 1 G IJ SOLR: 1 g | INTRAMUSCULAR | Qty: 1000

## 2022-07-26 NOTE — Plan of Care (Signed)
Care plan reviewed  Problem: Discharge Planning  Goal: Discharge to home or other facility with appropriate resources  Outcome: Progressing     Problem: Pain  Goal: Verbalizes/displays adequate comfort level or baseline comfort level  Outcome: Progressing     Problem: Skin/Tissue Integrity  Goal: Absence of new skin breakdown  Description: 1.  Monitor for areas of redness and/or skin breakdown  2.  Assess vascular access sites hourly  3.  Every 4-6 hours minimum:  Change oxygen saturation probe site  4.  Every 4-6 hours:  If on nasal continuous positive airway pressure, respiratory therapy assess nares and determine need for appliance change or resting period.  Outcome: Progressing     Problem: ABCDS Injury Assessment  Goal: Absence of physical injury  07/26/2022 2032 by Milbert Coulter, RN  Outcome: Progressing  07/26/2022 2031 by Milbert Coulter, RN  Outcome: Progressing     Problem: Safety - Adult  Goal: Free from fall injury  07/26/2022 2032 by Milbert Coulter, RN  Outcome: Progressing  07/26/2022 2031 by Milbert Coulter, RN  Outcome: Progressing

## 2022-07-26 NOTE — Plan of Care (Signed)
Care plan reviewed  Problem: Discharge Planning  Goal: Discharge to home or other facility with appropriate resources  Outcome: Progressing     Problem: Pain  Goal: Verbalizes/displays adequate comfort level or baseline comfort level  Outcome: Progressing     Problem: Skin/Tissue Integrity  Goal: Absence of new skin breakdown  Description: 1.  Monitor for areas of redness and/or skin breakdown  2.  Assess vascular access sites hourly  3.  Every 4-6 hours minimum:  Change oxygen saturation probe site  4.  Every 4-6 hours:  If on nasal continuous positive airway pressure, respiratory therapy assess nares and determine need for appliance change or resting period.  Outcome: Progressing     Problem: ABCDS Injury Assessment  Goal: Absence of physical injury  Outcome: Progressing     Problem: Safety - Adult  Goal: Free from fall injury  Outcome: Progressing

## 2022-07-26 NOTE — Progress Notes (Signed)
[facility]: MED RECONCILIATION    Comments/Recommendations:   Source: patient interview, Rx Query   Patient reports having a nebulizer but does not have medication for it.   Patient reports taking 1 whole tablet of furosemide 40 mg on Sundays and Wednesdays but a half tablet the rest of the week.   Patient reports being unsure if she was to remain on levothyroxine.  Patient last filled levothyroxine 75 mcg 12/2021 x 90days. Patient reports running out and not refilling.  Confirmed allergies and preferred pharmacy.       Prior to Admission Medications:   Prior to Admission Medications   Prescriptions Last Dose Informant   DULoxetine (CYMBALTA) 30 MG extended release capsule  Self, Outside Pharmacy/PCP   Sig: Take 1 capsule by mouth daily   albuterol sulfate HFA (PROVENTIL;VENTOLIN;PROAIR) 108 (90 Base) MCG/ACT inhaler  Self, Outside Pharmacy/PCP   Sig: Inhale 2 puffs into the lungs every 6 hours as needed for Wheezing or Shortness of Breath   allopurinol (ZYLOPRIM) 100 MG tablet  Self, Outside Pharmacy/PCP   Sig: Take 1 tablet by mouth daily   aspirin 81 MG EC tablet  Self, Outside Pharmacy/PCP   Sig: Take 1 tablet by mouth daily   clopidogrel (PLAVIX) 75 MG tablet  Self, Outside Pharmacy/PCP   Sig: Take 1 tablet by mouth daily   furosemide (LASIX) 40 MG tablet  Self, Outside Pharmacy/PCP   Sig: Take 0.5 tablets by mouth See Admin Instructions 1/2 tablet every Monday, Tues, Thurs, Fri, Sat   furosemide (LASIX) 40 MG tablet  Self, Outside Pharmacy/PCP   Sig: Take 1 tablet by mouth See Admin Instructions Every Sunday and Wednesday   isosorbide mononitrate (IMDUR) 30 MG extended release tablet  Self, Outside Pharmacy/PCP   Sig: Take 1 tablet by mouth daily           losartan (COZAAR) 25 MG tablet  Self, Outside Pharmacy/PCP   Sig: Take 0.5 tablets by mouth daily   pantoprazole (PROTONIX) 40 MG tablet  Self, Outside Pharmacy/PCP   Sig: Take 1 tablet by mouth every morning (before breakfast)   rosuvastatin (CRESTOR) 40 MG  tablet  Self, Outside Pharmacy/PCP   Sig: Take 1 tablet by mouth daily   spironolactone (ALDACTONE) 25 MG tablet  Self, Outside Pharmacy/PCP   Sig: Take 0.5 tablets by mouth daily      Facility-Administered Medications: None               Luvenia Redden, [facility]  Contact: [phone]

## 2022-07-26 NOTE — ED Provider Notes (Signed)
EMERGENCY DEPARTMENT HISTORY AND PHYSICAL EXAM      Date: 07/26/2022  Patient Name: Tanya [name]    History of Presenting Illness     Chief Complaint   Patient presents with    Shortness of Breath         HPI: History From: patient, History limited by: none  Tanya [name], 82 y.o. female presents to the ED with cc of cough, fever, shortness of breath.  This started yesterday.  She reports nonproductive cough, chills and sweats, and shortness of breath.  She has also had diarrhea.  She reports a burning discomfort in her chest when she coughs.  She denies leg swelling, no abdominal pain, no dysuria or hematuria, no known sick contacts.  She reports compliance with her medications including her daily Plavix and Lasix.          There are no other complaints, changes, or physical findings at this time.    PCP: Carron Curie, MD    No current facility-administered medications on file prior to encounter.     Current Outpatient Medications on File Prior to Encounter   Medication Sig Dispense Refill    aspirin 81 MG EC tablet Take 1 tablet by mouth daily      DULoxetine (CYMBALTA) 30 MG extended release capsule Take 1 capsule by mouth daily      pantoprazole (PROTONIX) 40 MG tablet Take 1 tablet by mouth every morning (before breakfast)      furosemide (LASIX) 40 MG tablet Take 0.5 tablets by mouth See Admin Instructions 1/2 tablet every Monday, Tues, Thurs, Fri, Sat      furosemide (LASIX) 40 MG tablet Take 1 tablet by mouth See Admin Instructions Every Sunday and Wednesday      losartan (COZAAR) 25 MG tablet Take 0.5 tablets by mouth daily      spironolactone (ALDACTONE) 25 MG tablet Take 0.5 tablets by mouth daily      albuterol sulfate HFA (PROVENTIL;VENTOLIN;PROAIR) 108 (90 Base) MCG/ACT inhaler Inhale 2 puffs into the lungs every 6 hours as needed for Wheezing or Shortness of Breath      allopurinol (ZYLOPRIM) 100 MG tablet Take 1 tablet by mouth daily      clopidogrel (PLAVIX) 75 MG tablet Take 1  tablet by mouth daily      isosorbide mononitrate (IMDUR) 30 MG extended release tablet Take 1 tablet by mouth daily      rosuvastatin (CRESTOR) 40 MG tablet Take 1 tablet by mouth daily         Past History     Past Medical History:  Past Medical History:   Diagnosis Date    Acid reflux     Asthma     CAD (coronary artery disease)     Hypertension        Past Surgical History:  Past Surgical History:   Procedure Laterality Date    ORTHOPEDIC SURGERY      right knee replacement    PR UNLISTED PROCEDURE CARDIAC SURGERY      Stents       Family History:  History reviewed. No pertinent family history.    Social History:  Social History     Tobacco Use    Smoking status: Every Day     Current packs/day: 0.25     Types: Cigarettes    Smokeless tobacco: Never   Substance Use Topics    Alcohol use: Yes     Alcohol/week: 2.5 standard drinks of alcohol  Drug use: Never       Allergies:  Allergies   Allergen Reactions    Iodinated Contrast Media Nausea Only    Penicillins Hives         Physical Exam   Physical Exam  Constitutional:       Appearance: She is ill-appearing.   HENT:      Head: Normocephalic and atraumatic.      Mouth/Throat:      Mouth: Mucous membranes are moist.      Pharynx: No oropharyngeal exudate or posterior oropharyngeal erythema.   Eyes:      Extraocular Movements: Extraocular movements intact.   Cardiovascular:      Rate and Rhythm: Tachycardia present. Rhythm irregular.   Pulmonary:      Comments: Respirations are tachypneic, intermittent hacking cough, no crackles, diffuse expiratory wheeze  Abdominal:      Palpations: Abdomen is soft.      Tenderness: There is no abdominal tenderness.   Musculoskeletal:         General: No tenderness.      Right lower leg: No edema.      Left lower leg: No edema.   Skin:     General: Skin is warm and dry.   Neurological:      General: No focal deficit present.      Mental Status: She is alert.   Psychiatric:         Mood and Affect: Mood normal.         Diagnostic  Study Results     Labs -     Recent Results (from the past 24 hour(s))   Blood Culture 1    Collection Time: 07/26/22  3:33 PM    Specimen: Blood   Result Value Ref Range    Special Requests LEFT  Antecubital        Culture NO GROWTH <24 HRS     Blood Culture 2    Collection Time: 07/26/22  3:33 PM    Specimen: Blood   Result Value Ref Range    Special Requests RIGHT  Antecubital        Culture NO GROWTH <24 HRS     CBC with Auto Differential    Collection Time: 07/26/22  3:33 PM   Result Value Ref Range    WBC 4.9 3.6 - 11.0 K/uL    RBC 4.85 3.80 - 5.20 M/uL    Hemoglobin 12.6 11.5 - 16.0 g/dL    Hematocrit 16.137.7 09.635.0 - 47.0 %    MCV 77.7 (L) 80.0 - 99.0 FL    MCH 26.0 26.0 - 34.0 PG    MCHC 33.4 30.0 - 36.5 g/dL    RDW 04.517.8 (H) 40.911.5 - 14.5 %    Platelets 211 150 - 400 K/uL    MPV 10.0 8.9 - 12.9 FL    Nucleated RBCs 0.0 0 PER 100 WBC    nRBC 0.00 0.00 - 0.01 K/uL    Neutrophils % 85 (H) 32 - 75 %    Lymphocytes % 8 (L) 12 - 49 %    Monocytes % 6 5 - 13 %    Eosinophils % 1 0 - 7 %    Basophils % 0 0 - 1 %    Immature Granulocytes 0 0.0 - 0.5 %    Neutrophils Absolute 4.1 1.8 - 8.0 K/UL    Lymphocytes Absolute 0.4 (L) 0.8 - 3.5 K/UL    Monocytes Absolute 0.3 0.0 - 1.0 K/UL  Eosinophils Absolute 0.1 0.0 - 0.4 K/UL    Basophils Absolute 0.0 0.0 - 0.1 K/UL    Absolute Immature Granulocyte 0.0 0.00 - 0.04 K/UL    Differential Type SMEAR SCANNED      RBC Comment ANISOCYTOSIS  1+       Comprehensive Metabolic Panel    Collection Time: 07/26/22  3:33 PM   Result Value Ref Range    Sodium 141 136 - 145 mmol/L    Potassium 3.6 3.5 - 5.1 mmol/L    Chloride 104 97 - 108 mmol/L    CO2 26 21 - 32 mmol/L    Anion Gap 11 5 - 15 mmol/L    Glucose 111 (H) 65 - 100 mg/dL    BUN 17 6 - 20 MG/DL    Creatinine 3.24 (H) 0.55 - 1.02 MG/DL    Bun/Cre Ratio 10 (L) 12 - 20      Est, Glom Filt Rate 30 (L) >60 ml/min/1.87m2    Calcium 8.7 8.5 - 10.1 MG/DL    Total Bilirubin 0.4 0.2 - 1.0 MG/DL    ALT 19 12 - 78 U/L    AST 23 15 - 37 U/L     Alk Phosphatase 101 45 - 117 U/L    Total Protein 7.4 6.4 - 8.2 g/dL    Albumin 3.5 3.5 - 5.0 g/dL    Globulin 3.9 2.0 - 4.0 g/dL    Albumin/Globulin Ratio 0.9 (L) 1.1 - 2.2     Troponin    Collection Time: 07/26/22  3:33 PM   Result Value Ref Range    Troponin, High Sensitivity 58 (H) 0 - 51 ng/L   COVID-19 & Influenza Combo    Collection Time: 07/26/22  3:33 PM    Specimen: Nasopharyngeal   Result Value Ref Range    SARS-CoV-2, PCR Not detected NOTD      Rapid Influenza A By PCR Detected      Rapid Influenza B By PCR Not detected     Brain Natriuretic Peptide    Collection Time: 07/26/22  3:33 PM   Result Value Ref Range    NT Pro-BNP 633 (H) 0 - 450 PG/ML   POC CHEMISTRY (NA,K,ICA,GLU,CALC HCT/HGB,LACTATE,CREA,CL)    Collection Time: 07/26/22  3:37 PM   Result Value Ref Range    POC Sodium 142 136 - 145 MMOL/L    POC Potassium 3.7 3.5 - 5.5 MMOL/L    POC Ionized Calcium 1.08 (L) 1.12 - 1.32 mmol/L    POC Chloride 110 (H) 100 - 108 MMOL/L    POC Creatinine 1.4 (H) 0.6 - 1.3 MG/DL    Anion Gap, POC 8 (L) 10 - 20      POC Glucose 115 (H) 74 - 106 MG/DL    eGFR, POC 37 (L) >40 ml/min/1.76m2    POC TCO2 24 19 - 24 MMOL/L    POC Lactic Acid 2.25 (HH) 0.40 - 2.00 mmol/L    Source VENOUS BLOOD      Performed by: Ludwig Clarks EDT    EKG 12 Lead (SOB)    Collection Time: 07/26/22  4:14 PM   Result Value Ref Range    Ventricular Rate 109 BPM    Atrial Rate 109 BPM    P-R Interval 146 ms    QRS Duration 126 ms    Q-T Interval 350 ms    QTc Calculation (Bazett) 471 ms    P Axis 69 degrees    R Axis -  44 degrees    T Axis 115 degrees    Diagnosis       Sinus tachycardia  Possible Left atrial enlargement  Left axis deviation  Left bundle branch block  Abnormal ECG  When compared with ECG of 26-Jul-2022 15:19,  MANUAL COMPARISON REQUIRED, DATA IS UNCONFIRMED     POC Lactic Acid    Collection Time: 07/26/22  5:22 PM   Result Value Ref Range    POC Lactic Acid 0.66 0.40 - 2.00 mmol/L       Radiologic Studies -   XR  CHEST PORTABLE   Final Result      No acute findings.            @CT48 @  @CXR48 @      Medical Decision Making   I am the first provider for this patient.    I reviewed the vital signs, available nursing notes, past medical history, past surgical history, family history and social history.    Vital Signs-Reviewed the patient's vital signs.  @VITALS24 @      Provider Notes (Medical Decision Making):   82 year old presenting with cough, chills, shortness of breath.  Differential includes pneumonia, COVID, influenza, viral syndrome, bronchitis, COPD exacerbation is likely with associated wheezing.  Also concern for possible arrhythmia, CHF, atypical ACS.  She is febrile and tachypneic.    ED Course:        Medications   sodium chloride flush 0.9 % injection 5-40 mL (has no administration in time range)   sodium chloride flush 0.9 % injection 5-40 mL (has no administration in time range)   0.9 % sodium chloride infusion (has no administration in time range)   ondansetron (ZOFRAN-ODT) disintegrating tablet 4 mg (has no administration in time range)     Or   ondansetron (ZOFRAN) injection 4 mg (has no administration in time range)   polyethylene glycol (GLYCOLAX) packet 17 g (has no administration in time range)   enoxaparin (LOVENOX) injection 40 mg (40 mg SubCUTAneous Given 07/26/22 1735)   acetaminophen (TYLENOL) tablet 650 mg (has no administration in time range)     Or   acetaminophen (TYLENOL) suppository 650 mg (has no administration in time range)   azithromycin (ZITHROMAX) 500 mg in sodium chloride 0.9% 250 mL (vial-mate/adapter) IVPB (has no administration in time range)   bisacodyl (DULCOLAX) suppository 10 mg (has no administration in time range)   ipratropium 0.5 mg-albuterol 2.5 mg (DUONEB) nebulizer solution 1 Dose (has no administration in time range)   guaiFENesin-dextromethorphan (ROBITUSSIN DM) 100-10 MG/5ML syrup 5 mL (has no administration in time range)   benzonatate (TESSALON) capsule 100 mg (has no  administration in time range)   predniSONE (DELTASONE) tablet 40 mg (has no administration in time range)   oseltamivir (TAMIFLU) capsule 30 mg (has no administration in time range)   aspirin chewable tablet 81 mg (has no administration in time range)   clopidogrel (PLAVIX) tablet 75 mg (has no administration in time range)   losartan (COZAAR) tablet 12.5 mg (has no administration in time range)   isosorbide mononitrate (IMDUR) extended release tablet 30 mg (has no administration in time range)   rosuvastatin (CRESTOR) tablet 40 mg (has no administration in time range)   furosemide (LASIX) tablet 40 mg (has no administration in time range)   allopurinol (ZYLOPRIM) tablet 100 mg (has no administration in time range)   spironolactone (ALDACTONE) tablet 25 mg (has no administration in time range)   acetaminophen (TYLENOL) tablet 1,000 mg (1,000  mg Oral Given 07/26/22 1554)   sodium chloride 0.9 % bolus 1,365 mL (0 mLs IntraVENous Stopped 07/26/22 1736)   cefTRIAXone (ROCEPHIN) 1,000 mg in sodium chloride 0.9 % 50 mL IVPB (mini-bag) (0 mg IntraVENous Stopped 07/26/22 1603)     And   azithromycin (ZITHROMAX) 500 mg in sodium chloride 0.9% 250 mL (vial-mate/adapter) IVPB (500 mg IntraVENous Given 07/26/22 1551)   ipratropium 0.5 mg-albuterol 2.5 mg (DUONEB) nebulizer solution 1 Dose (1 Dose Inhalation Given 07/26/22 1638)   methylPREDNISolone sodium (PF) (SOLU-MEDROL PF) injection 125 mg (125 mg IntraVENous Given 07/26/22 1632)   oseltamivir (TAMIFLU) capsule 75 mg (75 mg Oral Given 07/26/22 1653)        ED Course as of 07/26/22 1931   Tue Jul 26, 2022   1538 I reviewed her discharge summary from 2014, noted history of coronary artery disease with stent in 2003, COPD, hypertension, OSA [CM]   1627 Troponin is slightly elevated at 59, I suspect likely demand ischemia in setting of systemic illness and a tachyarrhythmia on arrival.  She denies active chest pain other than discomfort with coughing. [CM]   1723 Spoke with  hospitalist who accepts admission [CM]      ED Course User Index  [CM] Min-, Verdie Shire, MD        Initial EKG is performed at 15: 19, independently interpreted by myself as sinus tachycardia versus atrial flutter at a rate of 149, left axis deviation, likely rate dependent ST changes or in setting of bundle branch block, not concerning for STEMI.    She remains normotensive.    Repeat EKG is performed at 16: 14, independently interpreted by myself as sinus tachycardia at a rate of 109, left axis deviation, left bundle branch block, no ST segment elevation or depression concerning for ACS.    On chart review, she has a history of bundle branch block previously.    Now that she is back in sinus rhythm, will give her a breathing treatment.  Will empirically treat for likely respiratory infection.  Lactic acid elevated.    [facility] ED SEPSIS NOTE:     4:26 PM EST The patient now meets criteria for: Severe Sepsis    Fluid resuscitation with: 30 mL/kg crystalloid bolus using ideal weight because patient's BMI is 30 or greater  Due to concern for rapidly advancing infection and deterioration of patient's condition, antibiotics are started STAT and cultures ordered.    Chest x-ray is independently interpreted by myself as no acute infiltrate.      CBC with normal white blood cell count of 4.9.    Basic metabolic panel without worrisome electrolyte abnormalities.  She is informed of influenza positive status, Tamiflu ordered.    On reevaluation, the patient is resting comfortably, her vital signs are now normal, she is in sinus rhythm in the 80s to 90s on the monitor.  Still has some wheezing on auscultation.  Patient agrees to admission.    Critical Care Time:         Disposition:  Admit      PLAN:  1.      Medication List        STOP taking these medications      colchicine 0.6 MG tablet  Commonly known as: COLCRYS     metoprolol succinate 25 MG extended release tablet  Commonly known as: TOPROL XL            ASK your  doctor about these medications  albuterol sulfate HFA 108 (90 Base) MCG/ACT inhaler  Commonly known as: PROVENTIL;VENTOLIN;PROAIR     allopurinol 100 MG tablet  Commonly known as: ZYLOPRIM     aspirin 81 MG EC tablet     clopidogrel 75 MG tablet  Commonly known as: PLAVIX     DULoxetine 30 MG extended release capsule  Commonly known as: CYMBALTA  Ask about: Which instructions should I use?     * furosemide 40 MG tablet  Commonly known as: LASIX     * furosemide 40 MG tablet  Commonly known as: LASIX     isosorbide mononitrate 30 MG extended release tablet  Commonly known as: IMDUR     losartan 25 MG tablet  Commonly known as: COZAAR     pantoprazole 40 MG tablet  Commonly known as: PROTONIX     rosuvastatin 40 MG tablet  Commonly known as: CRESTOR     spironolactone 25 MG tablet  Commonly known as: ALDACTONE           * This list has 2 medication(s) that are the same as other medications prescribed for you. Read the directions carefully, and ask your doctor or other care provider to review them with you.                2. No follow-up provider specified.  Return to ED if worse     Diagnosis     Clinical Impression: Acute influenza A, acute COPD exacerbation               Min-, Verdie Shire, MD  07/26/22 1931

## 2022-07-26 NOTE — ED Notes (Signed)
Pt presents ambulatory to ED complaining of chest pain, shortness of breath, diarrhea, and right flank pain upon arrival. Pt is alert and oriented x 3, RR labored at rest, skin is hot and dry. Assesment completed and pt updated on plan of care.       Emergency Department Nursing Plan of Care       The Nursing Plan of Care is developed from the Nursing assessment and Emergency Department Attending provider initial evaluation.  The plan of care may be reviewed in the "ED Provider note".    The Plan of Care was developed with the following considerations:   Patient / Family readiness to learn indicated IR:[id number] understanding  Persons(s) to be included in education: patient  Barriers to Learning/Limitations:None    Signed

## 2022-07-26 NOTE — ED Notes (Signed)
TRANSFER - OUT REPORT:    Verbal report given to [facility] on BRECKIN ZAFAR  being transferred to PCU 3 for routine progression of patient care       Report consisted of patient's Situation, Background, Assessment and   Recommendations(SBAR).     Information from the following report(s) Nurse Handoff Report was reviewed with the receiving nurse.    Kinder Fall Assessment:    Presents to emergency department  because of falls (Syncope, seizure, or loss of consciousness): No  Age > 70: Yes  Altered Mental Status, Intoxication with alcohol or substance confusion (Disorientation, impaired judgment, poor safety awaremess, or inability to follow instructions): No  Impaired Mobility: Ambulates or transfers with assistive devices or assistance; Unable to ambulate or transer.: No  Nursing Judgement: No          Lines:   Peripheral IV 07/26/22 Left Antecubital (Active)   Site Assessment Clean, dry & intact 07/26/22 1537   Line Status Blood return noted;Flushed;Normal saline locked 07/26/22 1537   Line Care Cap changed;Connections checked and tightened 07/26/22 1537   Phlebitis Assessment No symptoms 07/26/22 1537   Infiltration Assessment 0 07/26/22 1537   Alcohol Cap Used No 07/26/22 1537   Dressing Status Clean, dry & intact;New dressing applied 07/26/22 1537   Dressing Type Transparent 07/26/22 1537   Dressing Intervention New 07/26/22 1537       Peripheral IV 07/26/22 Right Antecubital (Active)   Site Assessment Clean, dry & intact 07/26/22 1537   Line Status Blood return noted;Flushed;Normal saline locked 07/26/22 1537   Line Care Cap changed;Connections checked and tightened 07/26/22 1537   Phlebitis Assessment No symptoms 07/26/22 1537   Infiltration Assessment 0 07/26/22 1537   Alcohol Cap Used No 07/26/22 1537   Dressing Status New dressing applied;Clean, dry & intact 07/26/22 1537   Dressing Type Transparent 07/26/22 1537   Dressing Intervention New 07/26/22 1537        Opportunity for questions and clarification was  provided.      Patient transported with:  Monitor and Registered Nurse

## 2022-07-26 NOTE — ED Triage Notes (Signed)
Pt escorted to registration with difficulty breathing and SOB that worsened today.   +mid chest pain/burning since Sunday.+fevers/chills/sweats overnight

## 2022-07-26 NOTE — H&P (Signed)
History and Physical    Date of Service:  07/26/2022  Primary Care Provider: Calton Dach, APRN - NP  Source of information: patient    Chief Complaint: Shortness of Breath      History of Presenting Illness:   Tanya [name] is a 82 y.o. female with past medical history of COPD, CHF, CAD status post stent in 2003, CKD, gout, HLD presents to the ED due to 2-day history of dyspnea.  Patient reports on 01/20 patient had night sweats.  On 01/21, patient began to cough, have increased purulence of white sputum, nasal drainage, malaise, wheezing and worsened on 01/23.  Chest pain occurs when she is coughing.  Patient lives alone.  Patient arrived to the unit febrile with a temperature of 100.8, and tachycardic with a heart rate of 151 bpm saturating at 96% on room air.  ED documented she appeared ill appearing, tachycardic, irregular rhythm, lungs had diffuse wheezing.  Significant lab values include creatinine 1.66, lactic acid 2.25, pro-- BNP 633, troponin 58, MCV 77, RDW 17.8.   Code sepsis was called as patient met code septic criteria and she was started on empiric antibiotics. PCR was negative for influenza B and COVID however tested positive for influenza A.  Checks x-ray showed no acute cardiopulmonary pathology.  ECG significant for sinus tachycardia, left axis deviation, LBBB however this has been seen in other ECG.  Patient was admitted due to influenza and COPD exacerbation.    Currently, the patient denies any headache, blurry vision, sore throat,  chest pain, SOB, cough, fever, chills, urinary symptoms, constipation, recent travels, sick contacts, focal or generalized neurological symptoms, falls, injuries, rashes, contact with COVID-19 diagnosed patients, hematemesis, melena, hemoptysis, hematuria, rashes, denies starting any new medications and denies any other concerns or problems besides as mentioned above.         REVIEW OF SYSTEMS:  Refer to hpi    Past Medical History:   Diagnosis Date     Acid reflux     Asthma     CAD (coronary artery disease)     Hypertension       Past Surgical History:   Procedure Laterality Date    ORTHOPEDIC SURGERY      right knee replacement    PR UNLISTED PROCEDURE CARDIAC SURGERY      Stents     Prior to Admission medications    Medication Sig Start Date End Date Taking? Authorizing Provider   albuterol sulfate HFA (PROVENTIL;VENTOLIN;PROAIR) 108 (90 Base) MCG/ACT inhaler Inhale 1 puff into the lungs as needed    Automatic Reconciliation, Ar   allopurinol (ZYLOPRIM) 100 MG tablet Take 100 mg by mouth daily    Automatic Reconciliation, Ar   clopidogrel (PLAVIX) 75 MG tablet Take by mouth daily    Automatic Reconciliation, Ar   colchicine (COLCRYS) 0.6 MG tablet Take 0.6 mg by mouth daily    Automatic Reconciliation, Ar   DULoxetine (CYMBALTA) 20 MG extended release capsule Take 20 mg by mouth daily    Automatic Reconciliation, Ar   isosorbide mononitrate (IMDUR) 30 MG extended release tablet Take 30 mg by mouth daily    Automatic Reconciliation, Ar   levocetirizine (XYZAL) 5 MG tablet Take 5 mg by mouth daily as needed    Automatic Reconciliation, Ar   metoprolol succinate (TOPROL XL) 25 MG extended release tablet Take 25 mg by mouth daily    Automatic Reconciliation, Ar   rosuvastatin (CRESTOR) 40 MG tablet Take 40 mg by mouth  nightly    Automatic Reconciliation, Ar     Allergies   Allergen Reactions    Iodinated Contrast Media      Other reaction(s): Unknown (comments)    Penicillins Hives      History reviewed. No pertinent family history.   Social History:  reports that she has been smoking cigarettes. She has never used smokeless tobacco. She reports current alcohol use of about 2.5 standard drinks of alcohol per week. She reports that she does not use drugs.   Social Determinants of Health     Tobacco Use: High Risk (07/26/2022)    Patient History     Smoking Tobacco Use: Every Day     Smokeless Tobacco Use: Never     Passive Exposure: Not on file   Alcohol Use: Not on  file   Financial Resource Strain: Not on file   Food Insecurity: Not on file   Transportation Needs: Not on file   Physical Activity: Not on file   Stress: Not on file   Social Connections: Not on file   Intimate Partner Violence: Not on file   Depression: Not at risk (07/07/2021)    PHQ-2     PHQ-2 Score: 0   Housing Stability: Not on file   Interpersonal Safety: Not on file   Utilities: Not on file        Medications were reconciled to the best of my ability given all available resources at the time of admission. Route is PO if not otherwise noted.     Family and social history were personally reviewed, all pertinent and relevant details are outlined as above.    Objective:   BP 116/70   Pulse 98   Temp (!) 100.8 F (38.2 C) (Oral)   Resp 26   Ht 1.524 m (5')   Wt 92.1 kg (203 lb)   SpO2 99%   BMI 39.65 kg/m         PHYSICAL EXAM:   General: No acute respiratoy distress. Well developed, well nourished.  HEENT: Eyes: perrl, no discharge or icterus. No jvd appreciated  Cardiovascular: S1, S2, rrr, no mrg  Respiratory: wheezing b/l, no use of accessory muscles, no rales or crackles   Abdomen: active bowel sounds on 4q, abdomen soft, nontender, no guarding or rigidity, no peritoneal signs  Extremities: No lower ext edema, cyanosis, (+2) bi dorsalis pedal pulse  Neurological:  a&ox3. No facial asymmetry. Normal speech.  Mental Status: calm, cooperative.      Data Review:   I have independently reviewed and interpreted patient's lab and all other diagnostic data    Abnormal Labs Reviewed   CBC WITH AUTO DIFFERENTIAL - Abnormal; Notable for the following components:       Result Value    MCV 77.7 (*)     RDW 17.8 (*)     Neutrophils % 85 (*)     Lymphocytes % 8 (*)     Lymphocytes Absolute 0.4 (*)     All other components within normal limits   COMPREHENSIVE METABOLIC PANEL - Abnormal; Notable for the following components:    Glucose 111 (*)     Creatinine 1.66 (*)     Bun/Cre Ratio 10 (*)     Est, Glom Filt Rate  30 (*)     Albumin/Globulin Ratio 0.9 (*)     All other components within normal limits   TROPONIN - Abnormal; Notable for the following components:    Troponin, High Sensitivity 58 (*)  All other components within normal limits   BRAIN NATRIURETIC PEPTIDE - Abnormal; Notable for the following components:    NT Pro-BNP 633 (*)     All other components within normal limits   POC CHEMISTRY (NA,K,ICA,GLU,CALC HCT/HGB,LACTATE,CREA,CL) - Abnormal; Notable for the following components:    POC Ionized Calcium 1.08 (*)     POC Chloride 110 (*)     POC Creatinine 1.4 (*)     Anion Gap, POC 8 (*)     POC Glucose 115 (*)     eGFR, POC 37 (*)     POC Lactic Acid 2.25 (*)     All other components within normal limits       @MICRORESULTS @    IMAGING:   XR CHEST PORTABLE   Final Result      No acute findings.               ECG/ECHO:  @LASTCARDIOLOGY @       Notes reviewed from all clinical/nonclinical/nursing services involved in patient's clinical care. Care coordination discussions were held with appropriate clinical/nonclinical/ nursing providers based on care coordination needs.     Assessment and Plan :   Given the patient's current clinical presentation, there is a high level of concern for decompensation if discharged from the emergency department. Complex decision making was performed, which includes reviewing the patient's available past medical records, laboratory results, and imaging studies.    Active Problems:    * No active hospital problems. *  Resolved Problems:    * No resolved hospital problems. *      Influenza A pneumonia  COPD exacerbation  -2/2 to influenza A  -increase dyspnea, increase sputum production, increase purulence  -no home oxygen, legionella, Mycoplasma IgM   -azithromycin + ceftriaxone given by ED as a spesis protocol, abx de-escalated to azithromycin x 3 days total  -c/w duoneb, methyl prednisone x5 days, oxygen supplementation PRN for saturation, wean as tolerated to home levels    History of  CAD status post stent to RCA 2003  No chest pain  -Continue Plavix, statins, Metoprolol, Imdur    Hx  of hypothyroidism  Tsh ordered  -patient states stopped taking levothyroxine a long time ago    Hx of CHF  -no echo on file but based on med likely HFrEF  -compensated   -continue home meds: lasix 40mg  qd, losartan 25mg , spironolactone  25mg  qd,  -patient has BB     History of hypertension  -Isosorbide Mononitrate  ER 30 mg qd    CKD  -Today Cr:1.66 Baseline Cr1.34 ,GRF:30, CrCl 31  -f/u PTH and urine electrolytes  -c/w IVF, encourage PO intake of fluids  -avoid nephrotoxins. I&O    History of gout  -On allopurinol    History of hyperlipidemia  --Statins    Hx of stroke  -no residual deficits   -on asa/plavix    Hx of unclear dysphagia  -patient unclear type of esophageal problem but underwent a surgical dilation  -bedside swallow       DIET: No diet orders on file   ISOLATION PRECAUTIONS: No active isolations  CODE STATUS: @FULL @   DVT PROPHYLAXIS: lovenox  FUNCTIONAL STATUS PRIOR TO HOSPITALIZATION: indepdent  Ambulatory status/function: By self   EARLY MOBILITY ASSESSMENT: [facility]  ANTICIPATED DISCHARGE: to be deteremined by PT  ANTICIPATED DISPOSITION: 01/28  EMERGENCY CONTACT/SURROGATE DECISION MAKER: Sister 2004 [phone]    CRITICAL CARE WAS PERFORMED FOR THIS ENCOUNTER: none  Signed By: Sharene Skeans, MD     July 26, 2022         Please note that this dictation may have been completed with Dragon, the computer voice recognition software.  Quite often unanticipated grammatical, syntax, homophones, and other interpretive errors are inadvertently transcribed by the computer software.  Please disregard these errors.  Please excuse any errors that have escaped final proofreading.

## 2022-07-27 LAB — URINALYSIS WITH REFLEX TO CULTURE
BACTERIA, URINE: NEGATIVE /hpf
Bilirubin Urine: NEGATIVE
Blood, Urine: NEGATIVE
Glucose, UA: NEGATIVE mg/dL
Ketones, Urine: NEGATIVE mg/dL
Leukocyte Esterase, Urine: NEGATIVE
Nitrite, Urine: NEGATIVE
Protein, UA: NEGATIVE mg/dL
Specific Gravity, UA: 1.01
Urobilinogen, Urine: 0.2 EU/dL (ref 0.2–1.0)
pH, Urine: 5.5 (ref 5.0–8.0)

## 2022-07-27 LAB — BASIC METABOLIC PANEL
Anion Gap: 11 mmol/L (ref 5–15)
BUN: 16 MG/DL (ref 6–20)
Bun/Cre Ratio: 11 — ABNORMAL LOW (ref 12–20)
CO2: 25 mmol/L (ref 21–32)
Calcium: 7.9 MG/DL — ABNORMAL LOW (ref 8.5–10.1)
Chloride: 105 mmol/L (ref 97–108)
Creatinine: 1.44 MG/DL — ABNORMAL HIGH (ref 0.55–1.02)
Est, Glom Filt Rate: 36 mL/min/{1.73_m2} — ABNORMAL LOW (ref >60)
Glucose: 152 mg/dL — ABNORMAL HIGH (ref 65–100)
Potassium: 3.8 mmol/L (ref 3.5–5.1)
Sodium: 141 mmol/L (ref 136–145)

## 2022-07-27 LAB — PROCALCITONIN: Procalcitonin: <0.05 ng/mL

## 2022-07-27 LAB — PHOSPHORUS: Phosphorus: 3.8 MG/DL (ref 2.6–4.7)

## 2022-07-27 LAB — TSH: TSH, 3RD GENERATION: 0.45 u[IU]/mL (ref 0.36–3.74)

## 2022-07-27 LAB — POTASSIUM, URINE, RANDOM: POTASSIUM, RANDOM URINE: 19 MMOL/L

## 2022-07-27 LAB — PTH, INTACT
Calcium: 8 MG/DL — ABNORMAL LOW (ref 8.5–10.1)
Pth Intact: 236.1 pg/mL — ABNORMAL HIGH (ref 18.4–88.0)

## 2022-07-27 LAB — SODIUM, URINE, RANDOM: SODIUM, RANDOM URINE: 76 MMOL/L

## 2022-07-27 MED ORDER — CYCLOBENZAPRINE HCL 10 MG PO TABS
10 MG | Freq: Two times a day (BID) | ORAL | Status: DC
Start: 2022-07-27 — End: 2022-07-27

## 2022-07-27 MED ORDER — AZITHROMYCIN 500 MG PO TABS
500 MG | Freq: Every day | ORAL | Status: AC
Start: 2022-07-27 — End: 2022-07-28
  Administered 2022-07-27 – 2022-07-28 (×2): 500 mg via ORAL

## 2022-07-27 MED ORDER — DULOXETINE HCL 30 MG PO CPEP
30 MG | Freq: Every day | ORAL | Status: DC
Start: 2022-07-27 — End: 2022-07-29
  Administered 2022-07-27 – 2022-07-29 (×3): 30 mg via ORAL

## 2022-07-27 MED ORDER — CYCLOBENZAPRINE HCL 10 MG PO TABS
10 MG | Freq: Two times a day (BID) | ORAL | Status: DC | PRN
Start: 2022-07-27 — End: 2022-07-29
  Administered 2022-07-27: 17:00:00 5 mg via ORAL

## 2022-07-27 MED FILL — FUROSEMIDE 40 MG PO TABS: 40 MG | ORAL | Qty: 1

## 2022-07-27 MED FILL — ISOSORBIDE MONONITRATE ER 30 MG PO TB24: 30 MG | ORAL | Qty: 1

## 2022-07-27 MED FILL — SPIRONOLACTONE 25 MG PO TABS: 25 MG | ORAL | Qty: 1

## 2022-07-27 MED FILL — IPRATROPIUM-ALBUTEROL 0.5-2.5 (3) MG/3ML IN SOLN: RESPIRATORY_TRACT | Qty: 3

## 2022-07-27 MED FILL — DULOXETINE HCL 30 MG PO CPEP: 30 MG | ORAL | Qty: 1

## 2022-07-27 MED FILL — ALLOPURINOL 100 MG PO TABS: 100 MG | ORAL | Qty: 1

## 2022-07-27 MED FILL — CYCLOBENZAPRINE HCL 10 MG PO TABS: 10 MG | ORAL | Qty: 1

## 2022-07-27 MED FILL — ZITHROMAX 500 MG PO TABS: 500 MG | ORAL | Qty: 1

## 2022-07-27 MED FILL — PREDNISONE 20 MG PO TABS: 20 MG | ORAL | Qty: 2

## 2022-07-27 MED FILL — ASPIRIN LOW STRENGTH 81 MG PO CHEW: 81 MG | ORAL | Qty: 1

## 2022-07-27 MED FILL — ENOXAPARIN SODIUM 40 MG/0.4ML IJ SOSY: 40 MG/0.4ML | INTRAMUSCULAR | Qty: 0.4

## 2022-07-27 MED FILL — CLOPIDOGREL BISULFATE 75 MG PO TABS: 75 MG | ORAL | Qty: 1

## 2022-07-27 MED FILL — ROSUVASTATIN CALCIUM 10 MG PO TABS: 10 MG | ORAL | Qty: 4

## 2022-07-27 MED FILL — GUAIFENESIN-DM 100-10 MG/5ML PO SYRP: 100-10 MG/5ML | ORAL | Qty: 5

## 2022-07-27 MED FILL — LOSARTAN POTASSIUM 25 MG PO TABS: 25 MG | ORAL | Qty: 1

## 2022-07-27 MED FILL — OSELTAMIVIR PHOSPHATE 30 MG PO CAPS: 30 MG | ORAL | Qty: 1

## 2022-07-27 NOTE — Plan of Care (Signed)
Care plan reviewed.  Problem: Discharge Planning  Goal: Discharge to home or other facility with appropriate resources  Outcome: Progressing     Problem: Pain  Goal: Verbalizes/displays adequate comfort level or baseline comfort level  Outcome: Progressing     Problem: Skin/Tissue Integrity  Goal: Absence of new skin breakdown  Description: 1.  Monitor for areas of redness and/or skin breakdown  2.  Assess vascular access sites hourly  3.  Every 4-6 hours minimum:  Change oxygen saturation probe site  4.  Every 4-6 hours:  If on nasal continuous positive airway pressure, respiratory therapy assess nares and determine need for appliance change or resting period.  Outcome: Progressing     Problem: ABCDS Injury Assessment  Goal: Absence of physical injury  Outcome: Progressing     Problem: Safety - Adult  Goal: Free from fall injury  Outcome: Progressing

## 2022-07-27 NOTE — Care Coordination-Inpatient (Addendum)
Care Management Initial Assessment       RUR:  Readmission? No  1st IM letter given? Yes - 07/26/2022  1st [employer] letter given: No    CM completed bedside assessment with pt.    Pt confirmed demographics and that she was last seen by Dr. Jenetta Downer" (Dr Salome Spotted, phone [phone]) in the last several weeks prior to admission. Pt endorsed being independent in ADL's either using a cane/walker depending on needs and where she is ambulating.  No concerns expressed by pt for discharge.  She expects to discharge back to her apartment and indicated she would likely call her sister to pick her up but sister may be out of town so it's possible she would need transport assistance.    Discussed PCP follow up. Pt endorsed wanting to find a new PCP in future but in agreement with follow up with Dr. Jenetta Downer.  CM called PCP office [phone] to schedule hospital follow up appointment.  CM identified that pt is serviced by [employer]  Pt has an appointment scheduled for Monday 08/01/2022 at 3:10pm.      Attending indicated need for Pulmonary follow up and possible sleep study.  Since pt is in managed [facility] program, her PCP will have to refer for these appointments.     CM can reschedule this appointment if needed.     07/27/22 1438   Service Assessment   Patient Orientation Alert and Oriented   Cognition Alert   History Provided By Patient   Primary Caregiver [place] Family Members  (pt confirmed her sister is her emergency contact, Reinaldo Raddle, phone [phone])   PCP Verified by CM Yes   Last Visit to PCP Within last 3 months   Prior Functional Level Independent in ADLs/IADLs   Current Functional Level Independent in ADLs/IADLs   Can patient return to prior living arrangement Yes   Ability to make needs known: Good   Social/Functional History   Lives With Alone   Type of Home Apartment   Condition of Participation: Discharge Planning   The Plan for Transition of Care is related to the following treatment goals: likely  discharge home

## 2022-07-27 NOTE — Progress Notes (Signed)
[facility]                                                                                          Hospitalist Progress Note  Sharene Skeans, MD  Office Phone: [phone]        Date of Service:  07/27/2022  NAME:  Tanya [name]  DOB:  Dec 18, 1940  MRN:  [id number]       Admission Summary:   Tanya [name] is a 82 y.o. female with past medical history of COPD, CHF, CAD status post stent in 2003, CKD, gout, HLD presents to the ED due to 2-day history of dyspnea.  Patient reports on 01/20 patient had night sweats.  On 01/21, patient began to cough, have increased purulence of white sputum, nasal drainage, malaise, wheezing and worsened on 01/23.  Chest pain occurs when she is coughing.  Patient lives alone.  Patient arrived to the unit febrile with a temperature of 100.8, and tachycardic with a heart rate of 151 bpm saturating at 96% on room air.  ED documented she appeared ill appearing, tachycardic, irregular rhythm, lungs had diffuse wheezing.  Significant lab values include creatinine 1.66, lactic acid 2.25, pro-- BNP 633, troponin 58, MCV 77, RDW 17.8.   Code sepsis was called as patient met code septic criteria and she was started on empiric antibiotics. PCR was negative for influenza B and COVID however tested positive for influenza A.  Checks x-ray showed no acute cardiopulmonary pathology.  ECG significant for sinus tachycardia, left axis deviation, LBBB however this has been seen in other ECG.  Patient was admitted due to influenza and COPD exacerbation.     Currently, the patient denies any headache, blurry vision, sore throat,  chest pain, SOB, cough, fever, chills, urinary symptoms, constipation, recent travels, sick contacts, focal or generalized neurological symptoms, falls, injuries, rashes, contact with COVID-19 diagnosed patients, hematemesis, melena, hemoptysis, hematuria, rashes, denies starting any new medications and denies any other concerns or  problems besides as mentioned above.            Interval history / Subjective:   Patient endorsed having difficulty falling asleep, inquired about hx of sleep apnea, patient recalled using a cpap machine and losing it many years ago. Discussed need for a sleep study and the importance of utilizing a cpap machine, patient agreed  -ordered cpap qhs    Endorsed improved breathing however still wheezing and coughing.     Currently, patient denies headache, vision or hearing changes, fever, chills, weakness, chest pain, abd pain, N,V,D,C, blood in stool, melena, blood in urine, LE edema     Assessment & Plan:         Influenza A pneumonia  COPD exacerbation  -2/2 to influenza A  -increase dyspnea, increase sputum production, increase purulence  -no home oxygen, legionella, Mycoplasma IgM   -azithromycin + ceftriaxone given by ED as a spesis protocol, abx de-escalated to azithromycin x 3 days total  -c/w duoneb, methyl prednisone x5 days, oxygen supplementation PRN for saturation, wean as tolerated to home levels     History of  CAD status post stent to RCA 2003  No chest pain  -Continue Plavix, statins, Metoprolol, Imdur      Hx of CHF  -no echo on file but based on med likely HFrEF, ordered echo  -compensated   -continue home meds: lasix 40mg  qd, losartan 25mg , spironolactone  25mg  qd,  -held bb due to copd exacerbation but will need to start on discharge     History of hypertension  -ckd bp goal 125 to 130/<80 mmHg  -Isosorbide Mononitrate  ER 30 mg qd, losartan 25mg      CKD 3B  -Today Cr:1.66 Baseline Cr1.34 ,GRF:30, CrCl 31  -bp goal 125 to 130/<80 mmHg  -PTH 236 and urine electrolytes  -dietary restriction of phosphate  -c/w IVF, encourage PO intake of fluids  -avoid nephrotoxins. 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  -PTH 236  -calcitriol levels begin to fall when the eGFR is <40   -albumin normal  -order phosphorous, if phosphate above 5.5 will start phosphate binders     History of gout  -resumed  allopurinol     History of hyperlipidemia  -resumed Statins    Hx of sleep apnea  -patient lost her cpap machine years ago  -will need another sleep study after discharge  -cpap qhs     Hx of stroke  -no residual deficits   -on asa/plavix     Hx of unclear dysphagia  -patient unclear type of esophageal problem but underwent a surgical dilation  -bedside swallow passed    Hx of hypothyroidism  -patient states stopped taking levothyroxine a long time ago  -tsh normal, will monitor off levothyroxine      Code status: full  Prophylaxis: lovenox  Central Line:   none  Care Plan discussed with: patient   Anticipated Disposition: home  Inpatient  Cardiac monitoring: None         Social Determinants of Health     Tobacco Use: High Risk (07/26/2022)    Patient History     Smoking Tobacco Use: Every Day     Smokeless Tobacco Use: Never     Passive Exposure: Not on file   Alcohol Use: Not on file   Financial Resource Strain: Not on file   Food Insecurity: No Food Insecurity (07/26/2022)    Hunger Vital Sign     Worried About Running Out of Food in the Last Year: Never true     Ran Out of Food in the Last Year: Never true   Transportation Needs: No Transportation Needs (07/26/2022)    PRAPARE - (Medical): No     Lack of Transportation (Non-Medical): No   Physical Activity: Not on file   Stress: Not on file   Social Connections: Not on file   Intimate Partner Violence: Not on file   Depression: Not at risk (07/07/2021)    PHQ-2     PHQ-2 Score: 0   Housing Stability: Low Risk  (07/26/2022)    Housing Stability Vital Sign     Unable to Pay for Housing in the Last Year: No     Number of Places Lived in the Last Year: 1     Unstable Housing in the Last Year: No   Interpersonal Safety: Not At Risk (07/26/2022)    Interpersonal Safety [facility] HRSN)     How often does anyone, including family and friends, physically hurt you?: Never     How often  does anyone, including family and friends, scream or  curse at you?: Not on file     How often does anyone, including family and friends, insult or talk down to you?: Not on file     How often does anyone, including family and friends, threaten you with harm?: Not on file   Utilities: Not At Risk (07/26/2022)    [facility] Utilities     Threatened with loss of utilities: No       Review of Systems:   CONSTITUTIONAL:  No fever. No chills. No dizziness. No weakness.  CARDIOVASCULAR:  No chest pain. No palpitations. No lower extremity edema.  RESPIRATORY: Has shortness of breath, some cough, occasional pain with coughing No hemoptysis. No dyspnea. No paroxysmal nocturnal dyspnea.  GASTROINTESTINAL:  Normal appetite. No nausea, vomiting, diarrhea. No pain. No bloating. No melena.  GENITOURINARY:  No frequency, urgency, nocturia. No hematuria or dysuria.  NEUROLOGIC:  No headache. No neck pain. some weakness.  PSYCHIATRIC:  No confusion.      Vital Signs:    Last 24hrs VS reviewed since prior progress note. Most recent are:  Vitals:    07/27/22 1100   BP: 121/61   Pulse: 87   Resp: 20   Temp: 98.2 F (36.8 C)   SpO2: 98%         Intake/Output Summary (Last 24 hours) at 07/27/2022 1125  Last data filed at 07/27/2022 5462  Gross per 24 hour   Intake 950 ml   Output 2300 ml   Net -1350 ml        Physical Examination:     I had a face to face encounter with this patient and independently examined them on 07/27/2022 as outlined below:    General: No acute respiratoy distress. Well developed, well nourished.  HEENT: Eyes: perrl, no discharge or icterus. No jvd appreciated  Cardiovascular: S1, S2, rrr, no mrg  Respiratory: wheezing b/l, no use of accessory muscles, no rales or crackles   Abdomen: active bowel sounds on 4q, abdomen soft, nontender, no guarding or rigidity, no peritoneal signs  Extremities: No lower ext edema, cyanosis, (+2) bi dorsalis pedal pulse  Neurological:  a&ox3. No facial asymmetry. Normal speech.  Mental Status: calm, cooperative.    Data Review:    Review and/or  order of clinical lab test  I personally reviewed  Image      I have personally and independently reviewed all pertinent labs, diagnostic studies, imaging, and have provided independent interpretation of the same.     Labs:     Recent Labs     07/26/22  1533   WBC 4.9   HGB 12.6   HCT 37.7   PLT 211     Recent Labs     07/26/22  1533 07/27/22  0156   NA 141 141   K 3.6 3.8   CL 104 105   CO2 26 25   BUN 17 16     Recent Labs     07/26/22  1533   ALT 19   GLOB 3.9     No results for input(s): "INR", "APTT" in the last 72 hours.    Invalid input(s): "PTP"   No results for input(s): "TIBC", "FERR" in the last 72 hours.    Invalid input(s): "FE", "PSAT"   No results found for: "FOL", "RBCF"   No results for input(s): "PH", "PCO2", "PO2" in the last 72 hours.  No results for input(s): "CPK" in the last 72  hours.    Invalid input(s): "CPKMB", "CKNDX", "TROIQ"  No results found for: "CHOL", "CHOLX", "CHLST", "CHOLV", "HDL", "HDLC", "LDL", "LDLC", "TGLX", "TRIGL"  No results found for: "GLUCPOC"  @LABUA @    Notes reviewed from all clinical/nonclinical/nursing services involved in patient's clinical care. Care coordination discussions were held with appropriate clinical/nonclinical/ nursing providers based on care coordination needs.         Patients current active Medications were reviewed, considered, added and adjusted based on the clinical condition today.      Home Medications were reconciled to the best of my ability given all available resources at the time of admission. Route is PO if not otherwise noted.      Admission Status:[id number]:::1}      Medications Reviewed:     Current Facility-Administered Medications   Medication Dose Route Frequency    azithromycin (ZITHROMAX) tablet 500 mg  500 mg Oral Daily    sodium chloride flush 0.9 % injection 5-40 mL  5-40 mL IntraVENous 2 times per day    sodium chloride flush 0.9 % injection 5-40 mL  5-40 mL IntraVENous PRN    0.9 % sodium chloride infusion   IntraVENous PRN     ondansetron (ZOFRAN-ODT) disintegrating tablet 4 mg  4 mg Oral Q8H PRN    Or    ondansetron (ZOFRAN) injection 4 mg  4 mg IntraVENous Q6H PRN    polyethylene glycol (GLYCOLAX) packet 17 g  17 g Oral Daily PRN    enoxaparin (LOVENOX) injection 40 mg  40 mg SubCUTAneous Q24H    acetaminophen (TYLENOL) tablet 650 mg  650 mg Oral Q6H PRN    Or    acetaminophen (TYLENOL) suppository 650 mg  650 mg Rectal Q6H PRN    bisacodyl (DULCOLAX) suppository 10 mg  10 mg Rectal Daily PRN    ipratropium 0.5 mg-albuterol 2.5 mg (DUONEB) nebulizer solution 1 Dose  1 Dose Inhalation Q4H WA RT    guaiFENesin-dextromethorphan (ROBITUSSIN DM) 100-10 MG/5ML syrup 5 mL  5 mL Oral Q4H PRN    benzonatate (TESSALON) capsule 100 mg  100 mg Oral TID PRN    predniSONE (DELTASONE) tablet 40 mg  40 mg Oral Daily    oseltamivir (TAMIFLU) capsule 30 mg  30 mg Oral BID    aspirin chewable tablet 81 mg  81 mg Oral Daily    clopidogrel (PLAVIX) tablet 75 mg  75 mg Oral Daily    losartan (COZAAR) tablet 12.5 mg  12.5 mg Oral Daily    isosorbide mononitrate (IMDUR) extended release tablet 30 mg  30 mg Oral Daily    rosuvastatin (CRESTOR) tablet 40 mg  40 mg Oral Nightly    furosemide (LASIX) tablet 40 mg  40 mg Oral Daily    allopurinol (ZYLOPRIM) tablet 100 mg  100 mg Oral Daily    spironolactone (ALDACTONE) tablet 25 mg  25 mg Oral Daily     ______________________________________________________________________  EXPECTED LENGTH OF STAY: 3  ACTUAL LENGTH OF STAY:          1                 12-13-1983, MD

## 2022-07-28 ENCOUNTER — Inpatient Hospital Stay
Admit: 2022-07-28 | Discharge: 2022-08-02 | Payer: MEDICARE | Primary: Student in an Organized Health Care Education/Training Program

## 2022-07-28 LAB — BASIC METABOLIC PANEL
Anion Gap: 15 mmol/L (ref 5–15)
BUN: 20 MG/DL (ref 6–20)
Bun/Cre Ratio: 14 (ref 12–20)
CO2: 27 mmol/L (ref 21–32)
Calcium: 8.3 MG/DL — ABNORMAL LOW (ref 8.5–10.1)
Chloride: 106 mmol/L (ref 97–108)
Creatinine: 1.43 MG/DL — ABNORMAL HIGH (ref 0.55–1.02)
Est, Glom Filt Rate: 36 mL/min/{1.73_m2} — ABNORMAL LOW (ref >60)
Glucose: 97 mg/dL (ref 65–100)
Potassium: 3.9 mmol/L (ref 3.5–5.1)
Sodium: 148 mmol/L — ABNORMAL HIGH (ref 136–145)

## 2022-07-28 MED ORDER — GUAIFENESIN-DM 100-10 MG/5ML PO SYRP
100-10 MG/5ML | ORAL | 0 refills | Status: AC | PRN
Start: 2022-07-28 — End: 2022-08-07

## 2022-07-28 MED ORDER — PREDNISONE 20 MG PO TABS
20 MG | ORAL_TABLET | Freq: Every day | ORAL | 0 refills | Status: AC
Start: 2022-07-28 — End: 2022-07-31

## 2022-07-28 MED ORDER — OSELTAMIVIR PHOSPHATE 30 MG PO CAPS
30 MG | ORAL_CAPSULE | Freq: Two times a day (BID) | ORAL | 0 refills | Status: AC
Start: 2022-07-28 — End: 2022-08-02

## 2022-07-28 MED FILL — ALLOPURINOL 100 MG PO TABS: 100 MG | ORAL | Qty: 1

## 2022-07-28 MED FILL — DULOXETINE HCL 30 MG PO CPEP: 30 MG | ORAL | Qty: 1

## 2022-07-28 MED FILL — CLOPIDOGREL BISULFATE 75 MG PO TABS: 75 MG | ORAL | Qty: 1

## 2022-07-28 MED FILL — PREDNISONE 20 MG PO TABS: 20 MG | ORAL | Qty: 2

## 2022-07-28 MED FILL — ISOSORBIDE MONONITRATE ER 30 MG PO TB24: 30 MG | ORAL | Qty: 1

## 2022-07-28 MED FILL — OSELTAMIVIR PHOSPHATE 30 MG PO CAPS: 30 MG | ORAL | Qty: 1

## 2022-07-28 MED FILL — FUROSEMIDE 40 MG PO TABS: 40 MG | ORAL | Qty: 1

## 2022-07-28 MED FILL — IPRATROPIUM-ALBUTEROL 0.5-2.5 (3) MG/3ML IN SOLN: RESPIRATORY_TRACT | Qty: 3

## 2022-07-28 MED FILL — SPIRONOLACTONE 25 MG PO TABS: 25 MG | ORAL | Qty: 1

## 2022-07-28 MED FILL — ASPIRIN LOW STRENGTH 81 MG PO CHEW: 81 MG | ORAL | Qty: 1

## 2022-07-28 MED FILL — ENOXAPARIN SODIUM 40 MG/0.4ML IJ SOSY: 40 MG/0.4ML | INTRAMUSCULAR | Qty: 0.4

## 2022-07-28 MED FILL — ROSUVASTATIN CALCIUM 10 MG PO TABS: 10 MG | ORAL | Qty: 4

## 2022-07-28 MED FILL — ZITHROMAX 500 MG PO TABS: 500 MG | ORAL | Qty: 1

## 2022-07-28 MED FILL — LOSARTAN POTASSIUM 25 MG PO TABS: 25 MG | ORAL | Qty: 1

## 2022-07-28 NOTE — Progress Notes (Signed)
1230:  Patient is preparing to discharge. States that she doesn't feel ask if she's ready to go.  Feels she may need one more night.  Asked if anything had change from eariler this morning.  Patient states, "I just don't feel well."  Can't give any specific symptoms.  Denies any pain or discomfort.  Vital signs taken.  Vital are stable.  MD notified.  MD at bedside to see patient.  Discharge has been delayed.      1430:  Patient is lying in bed resting with eyes closed.  No acute distress noted.  No complaints voiced.  Will continue to monitor.    1700:  Patient assisted with dinner setup.  Denies any pain or discomfort.  Respirations are even and unlabored.  Patient eating dinner.

## 2022-07-28 NOTE — Discharge Summary (Signed)
Discharge Summary   Please note that this dictation was completed with Dragon, the computer voice recognition software.  Quite often unanticipated grammatical, syntax, homophones, and other interpretive errors are inadvertently transcribed by the computer software.  Please disregard these errors.  Please excuse any errors that have escaped final proofreading.    PATIENT ID: Tanya [name]  MRN: [id number]   DATE OF BIRTH: Jul 08, 1940    DATE OF ADMISSION: 07/26/2022  3:18 PM    DATE OF DISCHARGE: 07/28/2022  PRIMARY CARE PROVIDER: Gwenette Greet, MD         ATTENDING PHYSICIAN: Geoffery Lyons, MD  DISCHARGING PROVIDER: Geoffery Lyons, MD       CONSULTATIONS: None    PROCEDURES/SURGERIES: * No surgery found *    ADMITTING HPI from excerpted H&P   Tanya [name] is a 82 y.o. female with past medical history of COPD, CHF, CAD status post stent in 2003, CKD, gout, HLD presents to the ED due to 2-day history of dyspnea.  Patient reports on 01/20 patient had night sweats.  On 01/21, patient began to cough, have increased purulence of white sputum, nasal drainage, malaise, wheezing and worsened on 01/23.  Chest pain occurs when she is coughing.  Patient lives alone.  Patient arrived to the unit febrile with a temperature of 100.8, and tachycardic with a heart rate of 151 bpm saturating at 96% on room air.  ED documented she appeared ill appearing, tachycardic, irregular rhythm, lungs had diffuse wheezing.  Significant lab values include creatinine 1.66, lactic acid 2.25, pro-- BNP 633, troponin 58, MCV 77, RDW 17.8.   Code sepsis was called as patient met code septic criteria and she was started on empiric antibiotics. PCR was negative for influenza B and COVID however tested positive for influenza A.  Checks x-ray showed no acute cardiopulmonary pathology.  ECG significant for sinus tachycardia, left axis deviation, LBBB however this has been seen in other ECG.  Patient was admitted due to influenza and COPD  exacerbation.     Currently, the patient denies any headache, blurry vision, sore throat,  chest pain, SOB, cough, fever, chills, urinary symptoms, constipation, recent travels, sick contacts, focal or generalized neurological symptoms, falls, injuries, rashes, contact with COVID-19 diagnosed patients, hematemesis, melena, hemoptysis, hematuria, rashes, denies starting any new medications and denies any other concerns or problems besides as mentioned above.          HOSPITAL COURSE & DISCHARGE DIAGNOSIS/ PLAN:       Acute COPD exacerbation resolved  Influenza A infection  Chest x-ray reviewed dependently no acute process.  Influenza A antigen positive.  Patient was discharged home on prednisone Tamiflu course.  Patient completed Romycin course in the hospital.  Patient to follow-up with PCP as an outpatient further management of COPD    History of CAD status post PCI  History of congestive heart failure.  TTE ordered read pending  History of hypertension  History of CKD 3B  History of gout  History of hyperlipidemia and sleep apnea  History of CVA  History of hypothyroidism off levothyroxine    -Continue home regimen            PENDING TEST RESULTS:   At the time of discharge the following test results are still pending: TTE    FOLLOW UP APPOINTMENTS:    @DCFOLLOWUP @     ADDITIONAL CARE RECOMMENDATIONS: Follow-up with PCP    DIET: cardiac diet    ACTIVITY: activity as tolerated  WOUND CARE: None    EQUIPMENT needed: Patient owns DME      DISCHARGE MEDICATIONS:     Medication List        START taking these medications      guaiFENesin-dextromethorphan 100-10 MG/5ML syrup  Commonly known as: ROBITUSSIN DM  Take 5 mLs by mouth every 4 hours as needed for Cough     oseltamivir 30 MG capsule  Commonly known as: TAMIFLU  Take 1 capsule by mouth 2 times daily for 5 days     predniSONE 20 MG tablet  Commonly known as: DELTASONE  Take 2 tablets by mouth daily for 2 doses  Start taking on: July 29, 2022            CHANGE  how you take these medications      DULoxetine 30 MG extended release capsule  Commonly known as: CYMBALTA  What changed: Another medication with the same name was removed. Continue taking this medication, and follow the directions you see here.     furosemide 40 MG tablet  Commonly known as: LASIX  What changed: Another medication with the same name was removed. Continue taking this medication, and follow the directions you see here.            CONTINUE taking these medications      albuterol sulfate HFA 108 (90 Base) MCG/ACT inhaler  Commonly known as: PROVENTIL;VENTOLIN;PROAIR     allopurinol 100 MG tablet  Commonly known as: ZYLOPRIM     aspirin 81 MG EC tablet     clopidogrel 75 MG tablet  Commonly known as: PLAVIX     isosorbide mononitrate 30 MG extended release tablet  Commonly known as: IMDUR     losartan 25 MG tablet  Commonly known as: COZAAR     pantoprazole 40 MG tablet  Commonly known as: PROTONIX     rosuvastatin 40 MG tablet  Commonly known as: CRESTOR     spironolactone 25 MG tablet  Commonly known as: ALDACTONE            STOP taking these medications      colchicine 0.6 MG tablet  Commonly known as: COLCRYS     metoprolol succinate 25 MG extended release tablet  Commonly known as: TOPROL XL               Where to Get Your Medications        These medications were sent to [facility], [place] [phone] - [phone]  [place], [place] [address]      Phone: [phone]   guaiFENesin-dextromethorphan 100-10 MG/5ML syrup  oseltamivir 30 MG capsule  predniSONE 20 MG tablet           NOTIFY YOUR PHYSICIAN FOR ANY OF THE FOLLOWING:   Fever over 101 degrees for 24 hours.   Chest pain, shortness of breath, fever, chills, nausea, vomiting, diarrhea, change in mentation, falling, weakness, bleeding. Severe pain or pain not relieved by medications.  Or, any other signs or symptoms that you may have questions about.    DISPOSITION:    Home  With:   OT  PT  HH  RN       Long term SNF/Inpatient Rehab   x Independent/assisted living    Hospice    Other:       PATIENT CONDITION AT DISCHARGE:     Functional status    Poor  x Deconditioned     Independent      Cognition    x Lucid     Forgetful     Dementia      Catheters/lines (plus indication)    Foley     PICC     PEG    x None      Code status    x Full code     DNR      PHYSICAL EXAMINATION AT DISCHARGE:    General : alert x 3, awake, no acute distress,   HEENT: PEERL, EOMI, moist mucus membrane, TM clear  Neck: supple, no JVD, no meningeal signs  Chest: Clear to auscultation bilaterally   CVS: S1 S2 heard, Capillary refill less than 2 seconds  Abd: soft/ Non tender, non distended, BS physiological,   Ext: no clubbing, no cyanosis, no edema, brisk 2+ DP pulses  Neuro/Psych: pleasant mood and affect, CN 2-12 grossly intact, sensory grossly within normal limit, Strength 5/5 in all extremities, DTR 1+ x 4  Skin: warm     CHRONIC MEDICAL DIAGNOSES:      Greater than 31 minutes were spent with the patient on counseling and coordination of care    Signed:   Danella Maiers, MD  07/28/2022  12:07 PM

## 2022-07-28 NOTE — Discharge Instructions (Signed)
-   Please complete Tamiflu and prednisone course   - Follow up PCP

## 2022-07-29 MED FILL — ASPIRIN LOW STRENGTH 81 MG PO CHEW: 81 MG | ORAL | Qty: 1

## 2022-07-29 MED FILL — LOSARTAN POTASSIUM 25 MG PO TABS: 25 MG | ORAL | Qty: 1

## 2022-07-29 MED FILL — DULOXETINE HCL 30 MG PO CPEP: 30 MG | ORAL | Qty: 1

## 2022-07-29 MED FILL — OSELTAMIVIR PHOSPHATE 30 MG PO CAPS: 30 MG | ORAL | Qty: 1

## 2022-07-29 MED FILL — FUROSEMIDE 40 MG PO TABS: 40 MG | ORAL | Qty: 1

## 2022-07-29 MED FILL — ISOSORBIDE MONONITRATE ER 30 MG PO TB24: 30 MG | ORAL | Qty: 1

## 2022-07-29 MED FILL — ALLOPURINOL 100 MG PO TABS: 100 MG | ORAL | Qty: 1

## 2022-07-29 MED FILL — CLOPIDOGREL BISULFATE 75 MG PO TABS: 75 MG | ORAL | Qty: 1

## 2022-07-29 MED FILL — ROSUVASTATIN CALCIUM 10 MG PO TABS: 10 MG | ORAL | Qty: 4

## 2022-07-29 MED FILL — PREDNISONE 20 MG PO TABS: 20 MG | ORAL | Qty: 2

## 2022-07-29 MED FILL — SPIRONOLACTONE 25 MG PO TABS: 25 MG | ORAL | Qty: 1

## 2022-07-29 NOTE — Progress Notes (Signed)
0947- Patient requesting home purewick and may need a ride home. Notified care manager

## 2022-07-29 NOTE — Plan of Care (Signed)
Care plan reviewed.    Problem: Discharge Planning  Goal: Discharge to home or other facility with appropriate resources  Outcome: Progressing     Problem: Pain  Goal: Verbalizes/displays adequate comfort level or baseline comfort level  Outcome: Progressing     Problem: Skin/Tissue Integrity  Goal: Absence of new skin breakdown  Description: 1.  Monitor for areas of redness and/or skin breakdown  2.  Assess vascular access sites hourly  3.  Every 4-6 hours minimum:  Change oxygen saturation probe site  4.  Every 4-6 hours:  If on nasal continuous positive airway pressure, respiratory therapy assess nares and determine need for appliance change or resting period.  Outcome: Progressing     Problem: ABCDS Injury Assessment  Goal: Absence of physical injury  Outcome: Progressing     Problem: Safety - Adult  Goal: Free from fall injury  Outcome: Progressing

## 2022-07-29 NOTE — Progress Notes (Addendum)
1139) .Marland KitchenMarland KitchenReviewed discharge instructions with pt including follow-up appointments, medications and side effects,  COPD/smoking cessation education, signs/symptoms of stroke and heart attack, and MyChart information. Pt expressed understanding. IV was removed. Belongings sent home with pt.   1140) Message to Dr. Verda Cumins, pt would like to know if she can melatonin at home

## 2022-07-29 NOTE — Progress Notes (Signed)
Patient's case reviewed during interdisciplinary team meeting in Med Surg/Tele Unit 2.  Rev. Spurgeon Gancarz, MDiv, Chaplain

## 2022-07-29 NOTE — Discharge Summary (Addendum)
Discharge Summary   Please note that this dictation was completed with Dragon, the computer voice recognition software.  Quite often unanticipated grammatical, syntax, homophones, and other interpretive errors are inadvertently transcribed by the computer software.  Please disregard these errors.  Please excuse any errors that have escaped final proofreading.    PATIENT ID: Tanya [name]  MRN: [id number]   DATE OF BIRTH: 01-17-1941    DATE OF ADMISSION: 07/26/2022  3:18 PM    DATE OF DISCHARGE: 07/29/2022  PRIMARY CARE PROVIDER: Carron Curie, MD         ATTENDING PHYSICIAN: Danella Maiers, MD  DISCHARGING PROVIDER: Danella Maiers, MD       CONSULTATIONS: None    PROCEDURES/SURGERIES: * No surgery found *    ADMITTING HPI from excerpted H&P   Tanya [name] is a 82 y.o. female with past medical history of COPD, CHF, CAD status post stent in 2003, CKD, gout, HLD presents to the ED due to 2-day history of dyspnea.  Patient reports on 01/20 patient had night sweats.  On 01/21, patient began to cough, have increased purulence of white sputum, nasal drainage, malaise, wheezing and worsened on 01/23.  Chest pain occurs when she is coughing.  Patient lives alone.  Patient arrived to the unit febrile with a temperature of 100.8, and tachycardic with a heart rate of 151 bpm saturating at 96% on room air.  ED documented she appeared ill appearing, tachycardic, irregular rhythm, lungs had diffuse wheezing.  Significant lab values include creatinine 1.66, lactic acid 2.25, pro-- BNP 633, troponin 58, MCV 77, RDW 17.8.   Code sepsis was called as patient met code septic criteria and she was started on empiric antibiotics. PCR was negative for influenza B and COVID however tested positive for influenza A.  Checks x-ray showed no acute cardiopulmonary pathology.  ECG significant for sinus tachycardia, left axis deviation, LBBB however this has been seen in other ECG.  Patient was admitted due to influenza and COPD  exacerbation.     Currently, the patient denies any headache, blurry vision, sore throat,  chest pain, SOB, cough, fever, chills, urinary symptoms, constipation, recent travels, sick contacts, focal or generalized neurological symptoms, falls, injuries, rashes, contact with COVID-19 diagnosed patients, hematemesis, melena, hemoptysis, hematuria, rashes, denies starting any new medications and denies any other concerns or problems besides as mentioned above.          HOSPITAL COURSE & DISCHARGE DIAGNOSIS/ PLAN:       Acute COPD exacerbation resolved  Influenza A infection  Chest x-ray reviewed dependently no acute process.  Influenza A antigen positive.  Patient was discharged home on prednisone Tamiflu course.  Patient completed Romycin course in the hospital.  Patient to follow-up with PCP as an outpatient further management of COPD    History of CAD status post PCI  History of congestive heart failure.  TTE normal EF  History of hypertension  History of CKD 3B  History of gout  History of hyperlipidemia and sleep apnea  History of CVA  History of hypothyroidism off levothyroxine    -Continue home regimen            PENDING TEST RESULTS:   At the time of discharge the following test results are still pending:     FOLLOW UP APPOINTMENTS:    @DCFOLLOWUP @     ADDITIONAL CARE RECOMMENDATIONS: Follow-up with PCP    DIET: cardiac diet    ACTIVITY: activity as tolerated  WOUND CARE: None    EQUIPMENT needed: Patient owns DME      DISCHARGE MEDICATIONS:     Medication List        START taking these medications      guaiFENesin-dextromethorphan 100-10 MG/5ML syrup  Commonly known as: ROBITUSSIN DM  Take 5 mLs by mouth every 4 hours as needed for Cough     oseltamivir 30 MG capsule  Commonly known as: TAMIFLU  Take 1 capsule by mouth 2 times daily for 5 days     predniSONE 20 MG tablet  Commonly known as: DELTASONE  Take 2 tablets by mouth daily for 2 doses            CHANGE how you take these medications      DULoxetine  30 MG extended release capsule  Commonly known as: CYMBALTA  What changed: Another medication with the same name was removed. Continue taking this medication, and follow the directions you see here.     furosemide 40 MG tablet  Commonly known as: LASIX  What changed: Another medication with the same name was removed. Continue taking this medication, and follow the directions you see here.            CONTINUE taking these medications      albuterol sulfate HFA 108 (90 Base) MCG/ACT inhaler  Commonly known as: PROVENTIL;VENTOLIN;PROAIR     allopurinol 100 MG tablet  Commonly known as: ZYLOPRIM     aspirin 81 MG EC tablet     clopidogrel 75 MG tablet  Commonly known as: PLAVIX     isosorbide mononitrate 30 MG extended release tablet  Commonly known as: IMDUR     losartan 25 MG tablet  Commonly known as: COZAAR     pantoprazole 40 MG tablet  Commonly known as: PROTONIX     rosuvastatin 40 MG tablet  Commonly known as: CRESTOR     spironolactone 25 MG tablet  Commonly known as: ALDACTONE            STOP taking these medications      colchicine 0.6 MG tablet  Commonly known as: COLCRYS     metoprolol succinate 25 MG extended release tablet  Commonly known as: TOPROL XL               Where to Get Your Medications        These medications were sent to [facility], [place] [phone] - F [phone]  [place], [place] [address]      Phone: [phone]   guaiFENesin-dextromethorphan 100-10 MG/5ML syrup  oseltamivir 30 MG capsule  predniSONE 20 MG tablet           NOTIFY YOUR PHYSICIAN FOR ANY OF THE FOLLOWING:   Fever over 101 degrees for 24 hours.   Chest pain, shortness of breath, fever, chills, nausea, vomiting, diarrhea, change in mentation, falling, weakness, bleeding. Severe pain or pain not relieved by medications.  Or, any other signs or symptoms that you may have questions about.    DISPOSITION:    Home With:   OT  PT  HH  RN       Long term  SNF/Inpatient Rehab   x Independent/assisted living    Hospice    Other:       PATIENT CONDITION AT DISCHARGE:     Functional status    Poor    x Deconditioned  Independent      Cognition    x Lucid     Forgetful     Dementia      Catheters/lines (plus indication)    Foley     PICC     PEG    x None      Code status    x Full code     DNR      PHYSICAL EXAMINATION AT DISCHARGE:    General : alert x 3, awake, no acute distress,   HEENT: PEERL, EOMI, moist mucus membrane, TM clear  Neck: supple, no JVD, no meningeal signs  Chest: Clear to auscultation bilaterally   CVS: S1 S2 heard, Capillary refill less than 2 seconds  Abd: soft/ Non tender, non distended, BS physiological,   Ext: no clubbing, no cyanosis, no edema, brisk 2+ DP pulses  Neuro/Psych: pleasant mood and affect, CN 2-12 grossly intact, sensory grossly within normal limit, Strength 5/5 in all extremities, DTR 1+ x 4  Skin: warm     CHRONIC MEDICAL DIAGNOSES:      Greater than 31 minutes were spent with the patient on counseling and coordination of care    Signed:   Geoffery Lyons, MD  07/29/2022  9:02 AM

## 2022-07-29 NOTE — Plan of Care (Addendum)
Problem: Discharge Planning  Goal: Discharge to home or other facility with appropriate resources  07/29/2022 0946 by Cordelia Poche, RN  Outcome: Progressing  Flowsheets (Taken 07/29/2022 [phone])  Discharge to home or other facility with appropriate resources:   Identify barriers to discharge with patient and caregiver   Arrange for needed discharge resources and transportation as appropriate   Identify discharge learning needs (meds, wound care, etc)   Arrange for interpreters to assist at discharge as needed   Refer to discharge planning if patient needs post-hospital services based on physician order or complex needs related to functional status, cognitive ability or social support system  07/29/2022 0351 by Cala Bradford, RN  Outcome: Progressing     Problem: Pain  Goal: Verbalizes/displays adequate comfort level or baseline comfort level  07/29/2022 0946 by Cordelia Poche, RN  Outcome: Progressing  07/29/2022 0351 by Cala Bradford, RN  Outcome: Progressing     Problem: Skin/Tissue Integrity  Goal: Absence of new skin breakdown  Description: 1.  Monitor for areas of redness and/or skin breakdown  2.  Assess vascular access sites hourly  3.  Every 4-6 hours minimum:  Change oxygen saturation probe site  4.  Every 4-6 hours:  If on nasal continuous positive airway pressure, respiratory therapy assess nares and determine need for appliance change or resting period.  07/29/2022 0946 by Cordelia Poche, RN  Outcome: Progressing  07/29/2022 0351 by Cala Bradford, RN  Outcome: Progressing     Problem: ABCDS Injury Assessment  Goal: Absence of physical injury  07/29/2022 0946 by Cordelia Poche, RN  Outcome: Progressing  07/29/2022 0351 by Cala Bradford, RN  Outcome: Progressing     Problem: Safety - Adult  Goal: Free from fall injury  07/29/2022 0946 by Cordelia Poche, RN  Outcome: Progressing  07/29/2022 0351 by Cala Bradford, RN  Outcome: Progressing

## 2022-08-01 LAB — CULTURE, BLOOD 2: Culture: NO GROWTH

## 2022-08-01 LAB — CULTURE, BLOOD 1: Culture: NO GROWTH

## 2022-08-31 ENCOUNTER — Encounter

## 2022-09-09 ENCOUNTER — Inpatient Hospital Stay: Admit: 2022-09-09 | Payer: MEDICARE | Primary: Student in an Organized Health Care Education/Training Program

## 2022-09-09 DIAGNOSIS — R1031 Right lower quadrant pain: Secondary | ICD-10-CM

## 2022-11-08 ENCOUNTER — Encounter

## 2022-11-16 ENCOUNTER — Inpatient Hospital Stay: Admit: 2022-11-16 | Payer: MEDICARE | Primary: Student in an Organized Health Care Education/Training Program

## 2022-11-16 DIAGNOSIS — R109 Unspecified abdominal pain: Secondary | ICD-10-CM

## 2023-04-06 NOTE — Progress Notes (Signed)
 CARE TEAM:  Patient Care Team:  Omadjela, Otshumba, MD as PCP - General (Internal Medicine)    ASSESSMENT/PLAN  1. Lumbosacral spinal stenosis with neurogenic claudication    2. Lumbosacral spondylosis without myelopathy    3. Lumbar radiculopathy       There is no problem list on file for this patient.    Impression: 82 y.o. year old female presenting with lumbosacral spinal stenosis with neurogenic claudication, bilateral axial lower back pain and right lumbar radiculopathy.  Unclear etiology with unclear prognosis that will require further investigation.      Medications:   Ativan prescribed today for preprocedural anxiolysis.   Restart lyrica 25 mg BID  Patient was given details explaining the side effects of all the above medications.  Patient verbalized complete understanding.  Heat therapy for 15/20 minutes, 2-3 times per day for severe pain.  MRI of the L-spine from 07/01/21 with the following findings:  Spinal stenosis, Critical at L4-5, severe at L3-4, and moderate at L2-3.  Moderate bilateral L3-4 and right L4-5 neural foraminal stenosis  Grade 1 anterolisthesis of L4 on a degenerative basis  We discussed and will plan for the following procedure: Right L4-5 and L5-S1 TFESI  Patient may have to hold blood thinners before each spine injection in accordance with either SIS or ASRA guidelines depending on procedure. If cessation of blood thinners is needed it must be ok with the prescribing physician.   The possible risks including but not limited to infection, bleeding, nerve injury, spinal cord injury, and CSF leakage were explained to the patient.    Treatment Goal: reduction of pain intensity, increased physical functioning, and return to ADL's.  Follow up after ESI      HISTORY OF PRESENT ILLNESS  Chief Complaint: Pain of the Lower Back   Age: 82 y.o.  Sex: female     History of present illness: Ms. 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 The  pain is located in her lower back and radiates down to both legs. She takes tylenol  for the pain. Patient reports pain while walking that is relieved with a cane. Patient is not on dialysis despite low kidney function.  Pain rating = 7  out of 10      Today (04/06/2023): Patient presents today for follow-up.  She was last seen at the end of June.  She did not start PT as her pain was too severe.  She did take the pregabalin, but ran out of the prescription.  She believes it was somewhat helpful.  Pain is primarily localized to right lower back with radiation of pain down anterior portion of right leg.  She does note some radiation of pain down her posterior gluteal region as well.  She describes a burning sensation in her right leg.  She reports it is harder to participate in regular activities due to pain.  She presents today with a cane to assist with ambulation.  Pain rating = 8 out of 10      HPI    OBJECTIVE  Constitutional:  No acute distress. Her body mass index is 37.69 kg/m.   Eyes:  Sclera are nonicteric.  Respiratory:  No labored breathing.  Cardiovascular:  No marked edema.  Skin:  No marked skin ulcers.   Psychiatric: Alert and oriented x3.    MSK: Tenderness to palpation over bilateral lumbar paraspinal muscles. Normal strength and sensation bilateral lower extremities.  Supervising Physician: Dr. Dorita PRIES / STUDIES         No imaging obtained     PROCEDURES  Procedures

## 2023-05-29 ENCOUNTER — Emergency Department: Admit: 2023-05-29 | Payer: MEDICARE | Primary: Student in an Organized Health Care Education/Training Program

## 2023-05-29 ENCOUNTER — Inpatient Hospital Stay
Admission: EM | Admit: 2023-05-29 | Discharge: 2023-05-30 | Disposition: A | Discharge status: Home / Self Care | Payer: MEDICARE | Admitting: Emergency Medicine

## 2023-05-29 DIAGNOSIS — M25511 Pain in right shoulder: Secondary | ICD-10-CM

## 2023-05-29 DIAGNOSIS — K029 Dental caries, unspecified: Secondary | ICD-10-CM

## 2023-05-29 MED ORDER — HYDROCODONE-ACETAMINOPHEN 5-325 MG PO TABS
5-325 | ORAL_TABLET | Freq: Four times a day (QID) | ORAL | 0 refills | Status: AC | PRN
Start: 2023-05-29 — End: 2023-06-01

## 2023-05-29 MED ORDER — ONDANSETRON 4 MG PO TBDP
4 | Freq: Once | ORAL | Status: AC
Start: 2023-05-29 — End: 2023-05-29
  Administered 2023-05-29: 22:00:00 4 mg via ORAL

## 2023-05-29 MED ORDER — HYDROCODONE-ACETAMINOPHEN 5-325 MG PO TABS
5-325 | ORAL | Status: AC
Start: 2023-05-29 — End: 2023-05-29
  Administered 2023-05-29: 22:00:00 1 via ORAL

## 2023-05-29 MED FILL — HYDROCODONE-ACETAMINOPHEN 5-325 MG PO TABS: 5-325 MG | ORAL | Qty: 1

## 2023-05-29 MED FILL — ONDANSETRON 4 MG PO TBDP: 4 MG | ORAL | Qty: 1

## 2023-05-29 NOTE — ED Notes (Signed)
Pt wheeled out to waiting room to wait for ride with paperwork in hand. DC instructions given.

## 2023-05-29 NOTE — ED Triage Notes (Addendum)
 S/s ongoing x 2 days, denies injury

## 2023-05-29 NOTE — Discharge Instructions (Signed)
 Emergency Dental Care     [facility] - [facility]  [address]; [place], [place] [address]  Open Maythe Deramo-F: Lewayne Bunting  Main Phone: [phone]  Pediatric Phone: [phone]  $70 for Emergency Car

## 2023-05-29 NOTE — ED Provider Notes (Signed)
 [facility] EMERGENCY DEPT  EMERGENCY DEPARTMENT ENCOUNTER       Pt Name: Tanya [name]  MRN: [id number]  Birthdate 08/09/1940  Date of evaluation: 05/29/2023  Provider: Neldon Newport, MD   PCP: Carron Curie, MD  Note Started: 5:39 PM EST 05/29/23

## 2023-05-30 LAB — EKG 12-LEAD
Atrial Rate: 89 {beats}/min
Diagnosis: NORMAL
P Axis: 64 degrees
P-R Interval: 144 ms
Q-T Interval: 384 ms
QRS Duration: 132 ms
QTc Calculation (Bazett): 467 ms
R Axis: -39 degrees
T Axis: 118 degrees
Ventricular Rate: 89 {beats}/min

## 2023-07-20 ENCOUNTER — Encounter

## 2023-07-28 ENCOUNTER — Inpatient Hospital Stay: Admit: 2023-07-28 | Payer: MEDICARE | Primary: Student in an Organized Health Care Education/Training Program

## 2023-07-28 DIAGNOSIS — M5126 Other intervertebral disc displacement, lumbar region: Secondary | ICD-10-CM

## 2023-10-06 ENCOUNTER — Inpatient Hospital Stay
Admit: 2023-10-06 | Discharge: 2023-10-07 | Disposition: A | Discharge status: Home / Self Care | Payer: MEDICARE | Arrived: VH | Attending: Emergency Medicine

## 2023-10-06 DIAGNOSIS — M48062 Spinal stenosis, lumbar region with neurogenic claudication: Secondary | ICD-10-CM

## 2023-10-06 MED ORDER — MORPHINE SULFATE (PF) 2 MG/ML IV SOLN
2 | INTRAVENOUS | Status: AC
Start: 2023-10-06 — End: 2023-10-06
  Administered 2023-10-07: 2 mg via INTRAVENOUS

## 2023-10-06 MED ORDER — METHYLPREDNISOLONE SODIUM SUCC 125 MG IJ SOLR
125 | Freq: Once | INTRAMUSCULAR | Status: AC
Start: 2023-10-06 — End: 2023-10-06
  Administered 2023-10-07: 125 mg via INTRAVENOUS

## 2023-10-06 MED ORDER — LIDOCAINE 4 % EX PTCH
4 | CUTANEOUS | Status: DC
Start: 2023-10-06 — End: 2023-10-07
  Administered 2023-10-07: 1 via TRANSDERMAL

## 2023-10-06 MED ORDER — ACETAMINOPHEN 500 MG PO TABS
500 | ORAL | Status: AC
Start: 2023-10-06 — End: 2023-10-06
  Administered 2023-10-06: 1000 mg via ORAL

## 2023-10-06 MED ORDER — KETOROLAC TROMETHAMINE 30 MG/ML IJ SOLN
30 | INTRAMUSCULAR | Status: AC
Start: 2023-10-06 — End: 2023-10-06
  Administered 2023-10-07: 15 mg via INTRAVENOUS

## 2023-10-06 MED FILL — KETOROLAC TROMETHAMINE 30 MG/ML IJ SOLN: 30 MG/ML | INTRAMUSCULAR | Qty: 1 | Fill #0

## 2023-10-06 MED FILL — ACETAMINOPHEN EXTRA STRENGTH 500 MG PO TABS: 500 MG | ORAL | Qty: 2 | Fill #0

## 2023-10-06 MED FILL — STERILE WATER FOR INJECTION IJ SOLN: INTRAMUSCULAR | Qty: 10 | Fill #0

## 2023-10-06 MED FILL — MORPHINE SULFATE (PF) 2 MG/ML IV SOLN: 2 mg/mL | INTRAVENOUS | Qty: 1 | Fill #0

## 2023-10-06 MED FILL — SALONPAS PAIN RELIEVING 4 % EX PTCH: 4 % | CUTANEOUS | Qty: 1 | Fill #0

## 2023-10-06 MED FILL — METHYLPREDNISOLONE SODIUM SUCC 125 MG IJ SOLR: 125 MG | INTRAMUSCULAR | Qty: 125 | Fill #0

## 2023-10-06 NOTE — ED Triage Notes (Signed)
 Pt presents to the ED with complaints of pain in her back and bilateral legs. Pt states she is in so much pain that she can't walk. Pt denies that she took any pain meds.

## 2023-10-06 NOTE — ED Provider Notes (Signed)
 [facility]  EMERGENCY DEPARTMENT ENCOUNTER       Pt Name: Tanya [name]  MRN: [id number]  Birthdate 1940-09-03  Date of evaluation: 10/06/2023  Provider: Cyndy Freeze, MD   PCP: Carron Curie, MD  Note Started: 12:30 AM 10/06/23     CHIEF COMPLAINT       Chief Complaint   Patient presents with    Back Pain    Leg Pain        HISTORY OF PRESENT ILLNESS: 1 or more elements      History From: Patient, History limited by: No limitations     Tanya [name] is a 83 y.o. female with history of asthma, GERD, CAD, hypertension, lumbosacral spinal stenosis with neurogenic claudication who presents with chief complaint of acutely worsening back pain.  This worsened last night.  States this is typical to her flareups that she has had in the past.  Pain is located throughout her entire low back with diffuse radiation bilaterally and down bilateral lower extremities.  Denies any weakness, numbness, urinary or bowel incontinence or retention, or saddle anesthesia.  She follows with [facility] for this and has had spinal injections which have helped, most recent lumbar MRI was January 2025.  Denies any new injuries or falls.     Nursing Notes were all reviewed and agreed with or any disagreements were addressed in the HPI.     REVIEW OF SYSTEMS        Positives and Pertinent negatives as per HPI.    PAST HISTORY     Past Medical History:  Past Medical History:   Diagnosis Date    Acid reflux     Asthma     CAD (coronary artery disease)     Hypertension        Past Surgical History:  Past Surgical History:   Procedure Laterality Date    ORTHOPEDIC SURGERY      right knee replacement    PR UNLISTED PROCEDURE CARDIAC SURGERY      Stents       Family History:  History reviewed. No pertinent family history.    Social History:  Social History     Tobacco Use    Smoking status: Every Day     Current packs/day: 0.25     Types: Cigarettes    Smokeless tobacco: Never   Substance Use Topics     Alcohol use: Yes     Alcohol/week: 2.5 standard drinks of alcohol    Drug use: Yes     Types: Marijuana Sheran Fava)       Allergies:  Allergies   Allergen Reactions    Iodinated Contrast Media Nausea Only    Penicillins Hives       CURRENT MEDICATIONS      Discharge Medication List as of 10/06/2023 10:39 PM        CONTINUE these medications which have NOT CHANGED    Details   aspirin 81 MG EC tablet Take 1 tablet by mouth dailyHistorical Med      DULoxetine (CYMBALTA) 30 MG extended release capsule Take 1 capsule by mouth dailyHistorical Med      pantoprazole (PROTONIX) 40 MG tablet Take 1 tablet by mouth every morning (before breakfast)Historical Med      furosemide (LASIX) 40 MG tablet Take 1 tablet by mouth See Admin Instructions Every Sunday and WednesdayHistorical Med      losartan (COZAAR) 25 MG tablet Take 0.5 tablets by mouth dailyHistorical  Med      spironolactone (ALDACTONE) 25 MG tablet Take 0.5 tablets by mouth dailyHistorical Med      albuterol sulfate HFA (PROVENTIL;VENTOLIN;PROAIR) 108 (90 Base) MCG/ACT inhaler Inhale 2 puffs into the lungs every 6 hours as needed for Wheezing or Shortness of BreathHistorical Med      allopurinol (ZYLOPRIM) 100 MG tablet Take 1 tablet by mouth dailyHistorical Med      clopidogrel (PLAVIX) 75 MG tablet Take 1 tablet by mouth dailyHistorical Med      isosorbide mononitrate (IMDUR) 30 MG extended release tablet Take 1 tablet by mouth dailyHistorical Med      rosuvastatin (CRESTOR) 40 MG tablet Take 1 tablet by mouth dailyHistorical Med             SCREENINGS               No data recorded         PHYSICAL EXAM      ED Triage Vitals [10/06/23 1804]   BP Systolic BP Percentile Diastolic BP Percentile Temp Temp Source Pulse Respirations SpO2   (!) 146/86 -- -- 99.5 F (37.5 C) Oral 88 18 96 %      Height Weight         1.524 m (5') --              Physical Exam  Vitals and nursing note reviewed.   Constitutional:       General: She is in acute distress.      Appearance: Normal  appearance. She is obese.      Comments: Patient appears uncomfortable lying on left lateral side in ED stretcher in distress due to pain.   Cardiovascular:      Rate and Rhythm: Normal rate and regular rhythm.      Heart sounds: No murmur heard.  Pulmonary:      Effort: Pulmonary effort is normal. No respiratory distress.      Breath sounds: Normal breath sounds.   Musculoskeletal:      Comments: Diffuse reproducible TTP to the midline lumbar spine as well as bilateral paraspinal lumbar TTP   Neurological:      General: No focal deficit present.      Mental Status: She is alert and oriented to person, place, and time.      Comments: Bilateral lower extremities 5/5 motor strength, sensation intact          DIAGNOSTIC RESULTS   LABS:     Labs Reviewed   CBC WITH AUTO DIFFERENTIAL - Abnormal; Notable for the following components:       Result Value    MCV 77.4 (*)     MCH 25.4 (*)     RDW 18.5 (*)     All other components within normal limits   BASIC METABOLIC PANEL - Abnormal; Notable for the following components:    Sodium 146 (*)     Potassium 3.2 (*)     Anion Gap 14 (*)     Glucose 128 (*)     Creatinine 1.44 (*)     BUN/Creatinine Ratio 9 (*)     Est, Glom Filt Rate 36 (*)     All other components within normal limits          EKG: If performed, independent interpretation documented below in the MDM section     RADIOLOGY:  Non-plain film images such as CT, Ultrasound and MRI are read by the radiologist. Plain radiographic images are visualized  and preliminarily interpreted by the ED Provider with the findings documented in the MDM section.     Interpretation per the Radiologist below, if available at the time of this note:     @[id number] @   None       PROCEDURES   Unless otherwise noted below, none  Procedures     CRITICAL CARE TIME   0    EMERGENCY DEPARTMENT COURSE and DIFFERENTIAL DIAGNOSIS/MDM   Vitals:    Vitals:    10/06/23 2103 10/06/23 2106 10/06/23 2108 10/06/23 2110   BP:    (!) 169/81   Pulse: 77 81  74 81   Resp: 19 19 18 24    Temp:       TempSrc:       SpO2: 95% 96% 94% 94%   Weight:       Height:            Patient was given the following medications:  Medications   lidocaine 4 % external patch 1 patch (1 patch TransDERmal Patch Applied 10/06/23 2000)   ketorolac (TORADOL) injection 15 mg (15 mg IntraVENous Given 10/06/23 2021)   acetaminophen (TYLENOL) tablet 1,000 mg (1,000 mg Oral Given 10/06/23 1958)   methylPREDNISolone sodium succ (SOLU-MEDROL) 125 mg in sterile water 2 mL injection (125 mg IntraVENous Given 10/06/23 2021)   morphine (PF) injection 2 mg (2 mg IntraVENous Given 10/06/23 2021)   oxyCODONE (ROXICODONE) immediate release tablet 5 mg (5 mg Oral Given 10/06/23 2147)       Medical Decision Making  Amount and/or Complexity of Data Reviewed  External Data Reviewed: radiology.  Labs: ordered. Decision-making details documented in ED Course.  Radiology:  Decision-making details documented in ED Course.    Risk  OTC drugs.  Prescription drug management.  Decision regarding hospitalization.  Diagnosis or treatment significantly limited by social determinants of health.  Risk Details: Poor health literacy with extra time spent at bedside      83 year old female with history of lumbosacral spinal stenosis with neurogenic claudication presenting to the emergency department with acute on chronic low back pain radiating down bilateral lower extremities.  States this is typical to flareups that she has had in the past however more severe.  She lives by herself in an apartment and unable to ambulate independently at this time due to pain.  She has been taking Tylenol without relief of symptoms.  Denies any new weakness, numbness, urinary or bowel incontinence or retention or saddle anesthesia.  She follows with [facility] for the symptoms and has received spinal injections in the past.  Most recent lumbar MRI was January 2025.  No new injury or trauma.  While I considered evaluation with x-ray, CT, MRI feel this  is unnecessary given her known diagnosis of lumbar stenosis and neurogenic claudication and no new injury or trauma, therefore will defer at this time.  Will start IV to help with analgesia and administer acetaminophen, IV ketorolac, IV Solu-Medrol, IV morphine, topical lidocaine patch and reassess.    On reassessment patient feeling better after above medications.  She is resting in bed comfortably.  I did consider and offer her admission for intractable pain for pain control and possible placement as she does live by herself.  She is adamant that she will do fine at home and would very much like to be discharged home.  States that she has family she can call to help her.  I will start her on steroids, lidocaine patches to go  along with Tylenol.  Unfortunately unable to tolerate NSAIDs.  Considered writing prescription for opiates, however given her age feel this can cause more harm than good and will hold off on opiates at this time.  Encouraged follow-up with her pain management doctor at [facility] and given strict return ED precautions.    I personally reviewed lumbar MRI from January 2025 which revealed:  Prominent spinal canal stenosis, L3-L4 L4-5 as above.        FINAL IMPRESSION     1. Lumbar stenosis with neurogenic claudication          DISPOSITION/PLAN     Discharge Note:  The patient has been re-evaluated and is ready for discharge. Reviewed available results with patient. Counseled patient on diagnosis and care plan. Patient has expressed understanding, and all questions have been answered. Patient agrees with plan and agrees to follow up as recommended, or to return to the ED if their symptoms worsen. Discharge instructions have been provided and explained to the patient, along with reasons to return to the ED.        CLINICAL IMPRESSION    1. Lumbar stenosis with neurogenic claudication         DISPOSITION  discharge     PATIENT REFERRED TO:  Wonda Horner, MD  [address]   [address]  [place] [address]  [phone]    Schedule an appointment as soon as possible for a visit       [facility] Department  [address]  [place] [address]  [phone]  Go to   As needed, If symptoms worsen        DISCHARGE MEDICATIONS:     Medication List        START taking these medications      lidocaine 4 % external patch  Place 1 patch onto the skin daily     methylPREDNISolone 4 MG tablet  Commonly known as: MEDROL (PAK)  Take by mouth.            ASK your doctor about these medications      albuterol sulfate HFA 108 (90 Base) MCG/ACT inhaler  Commonly known as: PROVENTIL;VENTOLIN;PROAIR     allopurinol 100 MG tablet  Commonly known as: ZYLOPRIM     aspirin 81 MG EC tablet     clopidogrel 75 MG tablet  Commonly known as: PLAVIX     DULoxetine 30 MG extended release capsule  Commonly known as: CYMBALTA     furosemide 40 MG tablet  Commonly known as: LASIX     isosorbide mononitrate 30 MG extended release tablet  Commonly known as: IMDUR     losartan 25 MG tablet  Commonly known as: COZAAR     pantoprazole 40 MG tablet  Commonly known as: PROTONIX     rosuvastatin 40 MG tablet  Commonly known as: CRESTOR     spironolactone 25 MG tablet  Commonly known as: ALDACTONE               Where to Get Your Medications        These medications were sent to [facility] [address] [place], [place] - [address] - P [phone] Carmon Ginsberg [id number]  [address], [place] [address]      Phone: [phone]   lidocaine 4 % external patch  methylPREDNISolone 4 MG tablet           DISCONTINUED MEDICATIONS:  Discharge Medication  List as of 10/06/2023 10:39 PM          I am the Primary Clinician of Record.   Cyndy Freeze, MD (electronically signed)    (Please note that parts of this dictation were completed with voice recognition software. Quite often unanticipated grammatical, syntax, homophones, and other interpretive errors are inadvertently transcribed by the computer  software. Please disregards these errors. Please excuse any errors that have escaped final proofreading.)       Cyndy Freeze, MD  10/07/23 [phone]

## 2023-10-06 NOTE — ED Notes (Signed)
 Discharge instructions were given to the patient by [facility].     The patient left the Emergency Department alert and oriented and in no acute distress with 1 prescriptions. The patient was encouraged to call or return to the ED for worsening issues or problems and was encouraged to schedule a follow up appointment for continuing care.     Ambulation assessment completed before discharge.  Pt left Emergency Department ambulating at baseline with no ortho devices  Ortho device education: none    The patient verbalized understanding of discharge instructions and prescriptions, all questions were answered. The patient has no further concerns at this time.

## 2023-10-06 NOTE — ED Notes (Signed)
 Patient here with c/o back pain and leg pain.  Patient states pain in bilateral legs however states pain radiating down her right leg the worst.  Patient states that she has hx of arthritis and hx of sciatica.  Patient denies fall or trauma or injury.  Patient states that she gets injections for her chronic pain, stating that she recently got one but the pain has not improved since the injection.  Patient states increased difficulty ambulating due to pain.          Emergency Department Nursing Plan of Care       The Nursing Plan of Care is developed from the Nursing assessment and Emergency Department Attending provider initial evaluation.  The plan of care may be reviewed in the "ED Provider note".      The Plan of Care was developed with the following considerations:  Patient / Family readiness to learn indicated ZO:[id number] understanding  Persons(s) to be included in education: patient  Barriers to Learning/Limitations:None      Signed     Darnelle Catalan, RN    10/06/2023   6:35 PM

## 2023-10-07 LAB — CBC WITH AUTO DIFFERENTIAL
Basophils %: 0.7 % (ref 0.0–1.0)
Basophils Absolute: 0.04 10*3/uL (ref 0.00–0.10)
Eosinophils %: 1.6 % (ref 0.0–7.0)
Eosinophils Absolute: 0.09 10*3/uL (ref 0.00–0.40)
Hematocrit: 35.9 % (ref 35.0–47.0)
Hemoglobin: 11.8 g/dL (ref 11.5–16.0)
Immature Granulocytes %: 0.2 % (ref 0.0–0.5)
Immature Granulocytes Absolute: 0.01 10*3/uL (ref 0.00–0.04)
Lymphocytes %: 22 % (ref 12.0–49.0)
Lymphocytes Absolute: 1.24 10*3/uL (ref 0.80–3.50)
MCH: 25.4 pg — ABNORMAL LOW (ref 26.0–34.0)
MCHC: 32.9 g/dL (ref 30.0–36.5)
MCV: 77.4 FL — ABNORMAL LOW (ref 80.0–99.0)
MPV: 10.3 FL (ref 8.9–12.9)
Monocytes %: 7.8 % (ref 5.0–13.0)
Monocytes Absolute: 0.44 10*3/uL (ref 0.00–1.00)
Neutrophils %: 67.7 % (ref 32.0–75.0)
Neutrophils Absolute: 3.82 10*3/uL (ref 1.80–8.00)
Nucleated RBCs: 0 /100{WBCs}
Platelets: 249 10*3/uL (ref 150–400)
RBC: 4.64 M/uL (ref 3.80–5.20)
RDW: 18.5 % — ABNORMAL HIGH (ref 11.5–14.5)
WBC: 5.6 10*3/uL (ref 3.6–11.0)
nRBC: 0 10*3/uL (ref 0.00–0.01)

## 2023-10-07 LAB — BASIC METABOLIC PANEL
Anion Gap: 14 mmol/L — ABNORMAL HIGH (ref 2–12)
BUN/Creatinine Ratio: 9 — ABNORMAL LOW (ref 12–20)
BUN: 13 mg/dL (ref 6–20)
CO2: 26 mmol/L (ref 21–32)
Calcium: 9.4 mg/dL (ref 8.5–10.1)
Chloride: 106 mmol/L (ref 97–108)
Creatinine: 1.44 mg/dL — ABNORMAL HIGH (ref 0.55–1.02)
Est, Glom Filt Rate: 36 mL/min/{1.73_m2} — ABNORMAL LOW (ref >60)
Glucose: 128 mg/dL — ABNORMAL HIGH (ref 65–100)
Potassium: 3.2 mmol/L — ABNORMAL LOW (ref 3.5–5.1)
Sodium: 146 mmol/L — ABNORMAL HIGH (ref 136–145)

## 2023-10-07 MED ORDER — METHYLPREDNISOLONE 4 MG PO TBPK
4 | ORAL_TABLET | ORAL | 0 refills | Status: AC
Start: 2023-10-07 — End: 2023-10-12

## 2023-10-07 MED ORDER — LIDOCAINE 4 % EX PTCH
4 | MEDICATED_PATCH | Freq: Every day | CUTANEOUS | 0 refills | Status: AC
Start: 2023-10-07 — End: 2023-11-05

## 2023-10-07 MED ORDER — OXYCODONE HCL 5 MG PO TABS
5 | ORAL | Status: AC
Start: 2023-10-07 — End: 2023-10-06
  Administered 2023-10-07: 02:00:00 5 mg via ORAL

## 2023-10-07 MED FILL — OXYCODONE HCL 5 MG PO TABS: 5 MG | ORAL | Qty: 1 | Fill #0

## 2023-10-26 ENCOUNTER — Encounter

## 2023-11-14 ENCOUNTER — Ambulatory Visit: Payer: Medicare (Managed Care) | Primary: Student in an Organized Health Care Education/Training Program

## 2024-07-23 ENCOUNTER — Encounter

## 2024-07-23 ENCOUNTER — Inpatient Hospital Stay
Admit: 2024-07-23 | Payer: Medicare (Managed Care) | Primary: Student in an Organized Health Care Education/Training Program

## 2024-07-23 DIAGNOSIS — M79641 Pain in right hand: Secondary | ICD-10-CM
